# Patient Record
Sex: Female | Born: 1935
Health system: Southern US, Community
[De-identification: ages and names within clinical notes are randomized; demographics above are authoritative.]

## PROBLEM LIST (undated history)

## (undated) DIAGNOSIS — I10 Essential (primary) hypertension: Secondary | ICD-10-CM

## (undated) DIAGNOSIS — M1990 Unspecified osteoarthritis, unspecified site: Secondary | ICD-10-CM

## (undated) DIAGNOSIS — K2100 Gastro-esophageal reflux disease with esophagitis, without bleeding: Secondary | ICD-10-CM

## (undated) DIAGNOSIS — K219 Gastro-esophageal reflux disease without esophagitis: Secondary | ICD-10-CM

## (undated) DIAGNOSIS — E785 Hyperlipidemia, unspecified: Secondary | ICD-10-CM

## (undated) HISTORY — DX: Essential (primary) hypertension: I10

## (undated) HISTORY — DX: Hyperlipidemia, unspecified: E78.5

## (undated) HISTORY — DX: Unspecified osteoarthritis, unspecified site: M19.90

## (undated) HISTORY — DX: Gastro-esophageal reflux disease with esophagitis, without bleeding: K21.00

## (undated) HISTORY — DX: Gastro-esophageal reflux disease without esophagitis: K21.9

## (undated) HISTORY — PX: EYE SURGERY: SHX253

## (undated) HISTORY — DX: Gastro-esophageal reflux disease with esophagitis: K21.0

## (undated) HISTORY — PX: CATARACT EXTRACTION, BILATERAL: SHX1313

## (undated) HISTORY — PX: JOINT REPLACEMENT: SHX530

---
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
## 2004-08-26 ENCOUNTER — Ambulatory Visit: Payer: Self-pay | Admitting: Family Medicine

## 2005-08-31 ENCOUNTER — Ambulatory Visit: Payer: Self-pay | Admitting: Family Medicine

## 2006-04-15 ENCOUNTER — Ambulatory Visit: Payer: Self-pay | Admitting: Ophthalmology

## 2006-04-21 ENCOUNTER — Ambulatory Visit: Payer: Self-pay | Admitting: Ophthalmology

## 2006-09-07 ENCOUNTER — Ambulatory Visit: Payer: Self-pay | Admitting: Family Medicine

## 2007-05-26 ENCOUNTER — Ambulatory Visit: Payer: Self-pay | Admitting: Gastroenterology

## 2007-09-29 ENCOUNTER — Ambulatory Visit: Payer: Self-pay | Admitting: Family Medicine

## 2008-10-01 ENCOUNTER — Ambulatory Visit: Payer: Self-pay | Admitting: Family Medicine

## 2009-11-12 HISTORY — PX: REPLACEMENT TOTAL KNEE: SUR1224

## 2009-12-24 ENCOUNTER — Ambulatory Visit: Payer: Self-pay | Admitting: Family Medicine

## 2010-03-18 ENCOUNTER — Ambulatory Visit: Payer: Self-pay | Admitting: Family Medicine

## 2010-09-29 ENCOUNTER — Ambulatory Visit: Payer: Self-pay | Admitting: Family Medicine

## 2011-01-07 ENCOUNTER — Ambulatory Visit: Payer: Self-pay | Admitting: Family Medicine

## 2011-06-04 ENCOUNTER — Ambulatory Visit: Payer: Self-pay | Admitting: Family Medicine

## 2011-12-05 ENCOUNTER — Emergency Department: Payer: Self-pay | Admitting: Emergency Medicine

## 2011-12-05 LAB — COMPREHENSIVE METABOLIC PANEL
Alkaline Phosphatase: 150 U/L — ABNORMAL HIGH (ref 50–136)
BUN: 16 mg/dL (ref 7–18)
Calcium, Total: 8.5 mg/dL (ref 8.5–10.1)
Chloride: 108 mmol/L — ABNORMAL HIGH (ref 98–107)
Co2: 28 mmol/L (ref 21–32)
Creatinine: 0.89 mg/dL (ref 0.60–1.30)
EGFR (African American): >60
EGFR (Non-African Amer.): >60
Osmolality: 289 (ref 275–301)
SGOT(AST): 25 U/L (ref 15–37)
SGPT (ALT): 23 U/L (ref 12–78)
Sodium: 144 mmol/L (ref 136–145)

## 2011-12-05 LAB — CBC
HCT: 41.7 % (ref 35.0–47.0)
MCH: 28.2 pg (ref 26.0–34.0)
MCHC: 32.8 g/dL (ref 32.0–36.0)
RDW: 14.9 % — ABNORMAL HIGH (ref 11.5–14.5)

## 2011-12-05 LAB — CK TOTAL AND CKMB (NOT AT ARMC)
CK, Total: 132 U/L (ref 21–215)
CK-MB: 2 ng/mL (ref 0.5–3.6)

## 2012-02-05 ENCOUNTER — Ambulatory Visit: Payer: Self-pay | Admitting: Family Medicine

## 2012-05-24 ENCOUNTER — Ambulatory Visit: Payer: Self-pay | Admitting: Family Medicine

## 2012-07-07 ENCOUNTER — Ambulatory Visit: Payer: Self-pay | Admitting: Ophthalmology

## 2012-07-20 ENCOUNTER — Ambulatory Visit: Payer: Self-pay | Admitting: Ophthalmology

## 2012-08-31 ENCOUNTER — Ambulatory Visit: Payer: Self-pay | Admitting: Family Medicine

## 2012-09-07 ENCOUNTER — Ambulatory Visit: Payer: Self-pay | Admitting: Family Medicine

## 2013-05-18 DIAGNOSIS — Z9660 Presence of unspecified orthopedic joint implant: Secondary | ICD-10-CM | POA: Insufficient documentation

## 2013-06-11 ENCOUNTER — Emergency Department: Payer: Self-pay | Admitting: Internal Medicine

## 2014-01-11 ENCOUNTER — Emergency Department: Payer: Self-pay | Admitting: Emergency Medicine

## 2014-01-22 ENCOUNTER — Emergency Department: Payer: Self-pay | Admitting: Emergency Medicine

## 2014-01-22 LAB — COMPREHENSIVE METABOLIC PANEL
ALBUMIN: 3.5 g/dL (ref 3.4–5.0)
Alkaline Phosphatase: 157 U/L — ABNORMAL HIGH
Anion Gap: 7 (ref 7–16)
BUN: 22 mg/dL — ABNORMAL HIGH (ref 7–18)
Bilirubin,Total: 1 mg/dL (ref 0.2–1.0)
CO2: 33 mmol/L — AB (ref 21–32)
Calcium, Total: 8.9 mg/dL (ref 8.5–10.1)
Chloride: 100 mmol/L (ref 98–107)
Creatinine: 1.06 mg/dL (ref 0.60–1.30)
GFR CALC NON AF AMER: 54 — AB
Glucose: 106 mg/dL — ABNORMAL HIGH (ref 65–99)
Osmolality: 283 (ref 275–301)
POTASSIUM: 3.8 mmol/L (ref 3.5–5.1)
SGOT(AST): 17 U/L (ref 15–37)
SGPT (ALT): 24 U/L
SODIUM: 140 mmol/L (ref 136–145)
TOTAL PROTEIN: 7.3 g/dL (ref 6.4–8.2)

## 2014-01-22 LAB — CBC
HCT: 42.1 % (ref 35.0–47.0)
HGB: 13.3 g/dL (ref 12.0–16.0)
MCH: 27.1 pg (ref 26.0–34.0)
MCHC: 31.7 g/dL — ABNORMAL LOW (ref 32.0–36.0)
MCV: 85 fL (ref 80–100)
Platelet: 297 10*3/uL (ref 150–440)
RBC: 4.93 10*6/uL (ref 3.80–5.20)
RDW: 15.4 % — AB (ref 11.5–14.5)
WBC: 10 10*3/uL (ref 3.6–11.0)

## 2014-01-22 LAB — URINALYSIS, COMPLETE
BILIRUBIN, UR: NEGATIVE
BLOOD: NEGATIVE
GLUCOSE, UR: NEGATIVE mg/dL (ref 0–75)
Ketone: NEGATIVE
NITRITE: NEGATIVE
Ph: 5 (ref 4.5–8.0)
Protein: NEGATIVE
SPECIFIC GRAVITY: 1.012 (ref 1.003–1.030)
Squamous Epithelial: 1

## 2014-01-22 LAB — CK TOTAL AND CKMB (NOT AT ARMC)
CK, Total: 101 U/L (ref 26–192)
CK-MB: 1.3 ng/mL (ref 0.5–3.6)

## 2014-01-22 LAB — TROPONIN I

## 2014-02-14 DIAGNOSIS — H6123 Impacted cerumen, bilateral: Secondary | ICD-10-CM | POA: Diagnosis not present

## 2014-02-14 DIAGNOSIS — R42 Dizziness and giddiness: Secondary | ICD-10-CM | POA: Diagnosis not present

## 2014-02-14 DIAGNOSIS — I10 Essential (primary) hypertension: Secondary | ICD-10-CM | POA: Diagnosis not present

## 2014-02-19 DIAGNOSIS — R42 Dizziness and giddiness: Secondary | ICD-10-CM | POA: Diagnosis not present

## 2014-02-19 DIAGNOSIS — I10 Essential (primary) hypertension: Secondary | ICD-10-CM | POA: Diagnosis not present

## 2014-02-19 DIAGNOSIS — R51 Headache: Secondary | ICD-10-CM | POA: Diagnosis not present

## 2014-03-06 DIAGNOSIS — N952 Postmenopausal atrophic vaginitis: Secondary | ICD-10-CM | POA: Diagnosis not present

## 2014-03-29 DIAGNOSIS — I10 Essential (primary) hypertension: Secondary | ICD-10-CM | POA: Diagnosis not present

## 2014-04-05 DIAGNOSIS — H35412 Lattice degeneration of retina, left eye: Secondary | ICD-10-CM | POA: Diagnosis not present

## 2014-05-23 DIAGNOSIS — T148 Other injury of unspecified body region: Secondary | ICD-10-CM | POA: Diagnosis not present

## 2014-06-01 NOTE — Op Note (Signed)
PATIENT NAME:  LUA, FENG MR#:  [id number] DATE OF BIRTH:  Mar 17, 1935  DATE OF PROCEDURE:  07/20/2012  PREOPERATIVE DIAGNOSIS:  Senile cataract, left eye.  POSTOPERATIVE DIAGNOSIS:  Senile cataract, left eye.  PROCEDURE:  Phacoemulsification with posterior chamber intraocular lens implantation of the left eye.  LENS:  SN60WF 21.5-diopter posterior chamber intraocular lens.  ULTRASOUND TIME:  12% of 1 minute, 15 seconds.  CDE 8.9.   SURGEON:  Mali Marlayna Bannister, MD  ANESTHESIA:  Topical with tetracaine drops and 2% Xylocaine jelly.  COMPLICATIONS:  None.  DESCRIPTION OF PROCEDURE:  The patient was identified in the holding room and transported to the operating room and placed in the supine position under the operating microscope.  The left eye was identified as the operative eye and it was prepped and draped in the usual sterile ophthalmic fashion.  A 1 millimeter clear-corneal paracentesis was made at the 1:30 position.  The anterior chamber was filled with Viscoat viscoelastic.  A 2.4 millimeter keratome was used to make a near-clear corneal incision at the 10:30 position.  A curvilinear capsulorrhexis was made with a cystotome and capsulorrhexis forceps.  Balanced salt solution was used to hydrodissect and hydrodelineate the nucleus.  Phacoemulsification was then used in stop and chop fashion to remove the lens nucleus and epinucleus.  The remaining cortex was then removed using the irrigation and aspiration handpiece. Provisc was then placed into the capsular bag to distend it for lens placement.  A SN60WF 21.5-diopter lens was then injected into the capsular bag.  The remaining viscoelastic was aspirated.  Wounds were hydrated with balanced salt solution.  The anterior chamber was inflated to a physiologic pressure with balanced salt solution.  0.1 mL of cefuroxime 10 mg/mL were injected into the anterior chamber for a dose of 1 mg of intracameral antibiotic at the completion of the  case. Miostat was placed into the anterior chamber to constrict the pupil.  No wound leaks were noted.  Topical Vigamox drops and Maxitrol ointment were applied to the eye.  The patient was taken to the recovery room in stable condition without complications of anesthesia or surgery.   ____________________________ Wyonia Hough, MD crb:cb D: 07/20/2012 12:36:00 ET T: 07/20/2012 15:14:48 ET JOB#: [id number]  cc: Wyonia Hough, MD, <Dictator>  Leandrew Koyanagi MD ELECTRONICALLY SIGNED 07/27/2012 12:09

## 2014-07-31 ENCOUNTER — Ambulatory Visit (INDEPENDENT_AMBULATORY_CARE_PROVIDER_SITE_OTHER): Payer: BLUE CROSS/BLUE SHIELD | Admitting: Family Medicine

## 2014-07-31 ENCOUNTER — Encounter: Payer: Self-pay | Admitting: Family Medicine

## 2014-07-31 VITALS — BP 128/78 | HR 81 | Temp 98.5°F | Resp 18 | Ht 63.0 in | Wt 143.0 lb

## 2014-07-31 DIAGNOSIS — K219 Gastro-esophageal reflux disease without esophagitis: Secondary | ICD-10-CM | POA: Insufficient documentation

## 2014-07-31 DIAGNOSIS — E785 Hyperlipidemia, unspecified: Secondary | ICD-10-CM | POA: Insufficient documentation

## 2014-07-31 DIAGNOSIS — M150 Primary generalized (osteo)arthritis: Secondary | ICD-10-CM

## 2014-07-31 DIAGNOSIS — M159 Polyosteoarthritis, unspecified: Secondary | ICD-10-CM

## 2014-07-31 DIAGNOSIS — I10 Essential (primary) hypertension: Secondary | ICD-10-CM | POA: Insufficient documentation

## 2014-07-31 DIAGNOSIS — Z9660 Presence of unspecified orthopedic joint implant: Secondary | ICD-10-CM | POA: Diagnosis not present

## 2014-07-31 DIAGNOSIS — M170 Bilateral primary osteoarthritis of knee: Secondary | ICD-10-CM | POA: Insufficient documentation

## 2014-07-31 NOTE — Progress Notes (Signed)
Name: Catherine Burgess   MRN: [id number]    DOB: [birth year]   Date:07/31/2014       Progress Note  Subjective  Chief Complaint  Chief Complaint  Patient presents with  . Hypertension    Hypertension This is a chronic problem. The current episode started more than 1 year ago. The problem is unchanged. The problem is controlled. Pertinent negatives include no blurred vision, chest pain, headaches, neck pain, orthopnea, palpitations or shortness of breath. There are no associated agents to hypertension. Past treatments include angiotensin blockers. There are no compliance problems.  There is no history of angina or CAD/MI.  Arthritis Presents for follow-up visit. She complains of pain. Affected locations include the left hip. Associated symptoms include fatigue and pain at night. Pertinent negatives include no diarrhea, dysuria, fever or weight loss. Her past medical history is significant for psoriasis. Past treatments include surgery and NSAIDs. Compliance with total regimen is 76-100%.  Hyperlipidemia This is a chronic problem. The current episode started more than 1 year ago. The problem is controlled. Recent lipid tests were reviewed and are normal. Pertinent negatives include no chest pain, focal weakness, myalgias or shortness of breath. Current antihyperlipidemic treatment includes statins. Risk factors for coronary artery disease include dyslipidemia, hypertension and a sedentary lifestyle.      Past Medical History  Diagnosis Date  . Hypertension     History  Substance Use Topics  . Smoking status: Never Smoker   . Smokeless tobacco: Not on file  . Alcohol Use: No     Current outpatient prescriptions:  .  atorvastatin (LIPITOR) 20 MG tablet, , Disp: , Rfl: 0 .  atorvastatin (LIPITOR) 40 MG tablet, , Disp: , Rfl: 0 .  chlorthalidone (HYGROTON) 25 MG tablet, Take 25 mg by mouth daily., Disp: , Rfl: 0 .  gabapentin (NEURONTIN) 300 MG capsule, , Disp: , Rfl: 0 .  losartan  (COZAAR) 100 MG tablet, , Disp: , Rfl: 0 .  meloxicam (MOBIC) 7.5 MG tablet, , Disp: , Rfl: 0 .  pantoprazole (PROTONIX) 40 MG tablet, , Disp: , Rfl: 1  Allergies  Allergen Reactions  . Lovastatin Other (See Comments)    chest  . Rosuvastatin     Other reaction(s): Muscle Pain chest  . Statins     Other reaction(s): Muscle Pain chest    Review of Systems  Constitutional: Positive for fatigue. Negative for fever, chills and weight loss.  HENT: Negative for congestion, hearing loss, sore throat and tinnitus.   Eyes: Negative for blurred vision, double vision and redness.  Respiratory: Negative for cough, hemoptysis and shortness of breath.   Cardiovascular: Negative for chest pain, palpitations, orthopnea, claudication and leg swelling.  Gastrointestinal: Negative for heartburn, nausea, vomiting, diarrhea, constipation and blood in stool.  Genitourinary: Negative for dysuria, urgency, frequency and hematuria.  Musculoskeletal: Positive for joint pain and arthritis. Negative for myalgias, back pain, falls and neck pain.  Skin: Negative for itching.  Neurological: Negative for dizziness, tingling, tremors, focal weakness, seizures, loss of consciousness, weakness and headaches.  Endo/Heme/Allergies: Does not bruise/bleed easily.  Psychiatric/Behavioral: Negative for depression and substance abuse. The patient is nervous/anxious. The patient does not have insomnia.      Objective  Filed Vitals:   07/31/14 1121  BP: 128/78  Pulse: 81  Temp: 98.5 F (36.9 C)  Resp: 18  Height: 5\' 3"  (1.6 m)  Weight: 143 lb (64.864 kg)  SpO2: 95%     Physical Exam  Constitutional: She is  oriented to person, place, and time and well-developed, well-nourished, and in no distress.  HENT:  Head: Normocephalic.  Eyes: EOM are normal. Pupils are equal, round, and reactive to light.  Neck: Normal range of motion. No thyromegaly present.  Cardiovascular: Normal rate, regular rhythm and normal  heart sounds.   No murmur heard. Pulmonary/Chest: Effort normal and breath sounds normal.  Abdominal: Soft. Bowel sounds are normal.  Musculoskeletal: She exhibits tenderness (LEFT HIP TENDER WITH LIMITED RANGE OF MOTION AN ANTALGIC GAIT). She exhibits no edema.  Neurological: She is alert and oriented to person, place, and time. No cranial nerve deficit. Gait normal.  Skin: Skin is warm and dry. No rash noted.  Psychiatric: Memory and affect normal.      Assessment & Plan  1. Essential hypertension Well-controlled - Comprehensive metabolic panel  2. Primary osteoarthritis involving multiple joints stable  3. HLD (hyperlipidemia) Lab work today - TSH - Lipid panel  4. History of artificial joint stable

## 2014-07-31 NOTE — Patient Instructions (Signed)
F/U 4 MO 

## 2014-08-04 DIAGNOSIS — R21 Rash and other nonspecific skin eruption: Secondary | ICD-10-CM | POA: Diagnosis not present

## 2014-08-06 DIAGNOSIS — I10 Essential (primary) hypertension: Secondary | ICD-10-CM | POA: Diagnosis not present

## 2014-08-06 DIAGNOSIS — E785 Hyperlipidemia, unspecified: Secondary | ICD-10-CM | POA: Diagnosis not present

## 2014-08-06 DIAGNOSIS — Z9660 Presence of unspecified orthopedic joint implant: Secondary | ICD-10-CM | POA: Diagnosis not present

## 2014-08-07 LAB — COMPREHENSIVE METABOLIC PANEL
ALBUMIN: 3.8 g/dL (ref 3.5–4.8)
ALK PHOS: 130 IU/L — AB (ref 39–117)
ALT: 17 IU/L (ref 0–32)
AST: 18 IU/L (ref 0–40)
Albumin/Globulin Ratio: 1.8 (ref 1.1–2.5)
BILIRUBIN TOTAL: 0.7 mg/dL (ref 0.0–1.2)
BUN/Creatinine Ratio: 16 (ref 11–26)
BUN: 16 mg/dL (ref 8–27)
CHLORIDE: 101 mmol/L (ref 97–108)
CO2: 27 mmol/L (ref 18–29)
CREATININE: 0.98 mg/dL (ref 0.57–1.00)
Calcium: 9.2 mg/dL (ref 8.7–10.3)
GFR calc non Af Amer: 56 mL/min/{1.73_m2} — ABNORMAL LOW (ref >59)
GFR, EST AFRICAN AMERICAN: 65 mL/min/{1.73_m2} (ref >59)
Globulin, Total: 2.1 g/dL (ref 1.5–4.5)
Glucose: 93 mg/dL (ref 65–99)
Potassium: 4.4 mmol/L (ref 3.5–5.2)
Sodium: 142 mmol/L (ref 134–144)
Total Protein: 5.9 g/dL — ABNORMAL LOW (ref 6.0–8.5)

## 2014-08-07 LAB — LIPID PANEL
CHOL/HDL RATIO: 2.4 ratio (ref 0.0–4.4)
Cholesterol, Total: 110 mg/dL (ref 100–199)
HDL: 46 mg/dL (ref >39)
LDL CALC: 52 mg/dL (ref 0–99)
Triglycerides: 61 mg/dL (ref 0–149)
VLDL Cholesterol Cal: 12 mg/dL (ref 5–40)

## 2014-08-07 LAB — TSH: TSH: 1.03 u[IU]/mL (ref 0.450–4.500)

## 2014-08-09 NOTE — Progress Notes (Signed)
Patient notified of lab results

## 2014-08-23 ENCOUNTER — Other Ambulatory Visit: Payer: Self-pay | Admitting: Family Medicine

## 2014-08-24 ENCOUNTER — Other Ambulatory Visit: Payer: Self-pay | Admitting: Family Medicine

## 2014-09-04 ENCOUNTER — Ambulatory Visit: Payer: Self-pay | Admitting: Obstetrics and Gynecology

## 2014-09-26 ENCOUNTER — Ambulatory Visit (INDEPENDENT_AMBULATORY_CARE_PROVIDER_SITE_OTHER): Payer: Medicare Other | Admitting: Obstetrics and Gynecology

## 2014-09-26 ENCOUNTER — Encounter: Payer: Self-pay | Admitting: Obstetrics and Gynecology

## 2014-09-26 VITALS — BP 106/67 | HR 77 | Resp 16 | Ht 63.0 in | Wt 144.0 lb

## 2014-09-26 DIAGNOSIS — N952 Postmenopausal atrophic vaginitis: Secondary | ICD-10-CM | POA: Diagnosis not present

## 2014-09-26 NOTE — Progress Notes (Signed)
GYNECOLOGY PROGRESS NOTE  Subjective:    Patient ID: Catherine Burgess, female    DOB: 01/19/1936, 79 y.o.   MRN: [id number]  HPI  Patient is a 79 y.o. postmenopausal female who presents for 6 month f/u of vaginal atrophy. Patient currently using vaginal moisturizers prn.  Deni complaints.  Feels well overall.   The following portions of the patient's history were reviewed and updated as appropriate: allergies, current medications, past family history, past medical history, past social history, past surgical history and problem list.  Review of Systems A comprehensive review of systems was negative.   Objective:   Blood pressure 106/67, pulse 77, resp. rate 16, height 5\' 3"  (1.6 m), weight 144 lb (65.318 kg). General appearance: alert and no distress Abdomen: soft, non-tender; bowel sounds normal; no masses,  no organomegaly Pelvic: external genitalia without lesions.  Areas of vitiligo noted.  Agglutination of posterior forchette noted, separated manually without difficulty.  Mild-moderate atrophy present at introitus and vaginal mucosa.  No vaginal discharge present. Cervix firm, flushed to vaginal vault.  Bimanual exam not done.   Extremities: extremities normal, atraumatic, no cyanosis or edema Neurologic: Grossly normal   Assessment:   Postmenopausal vaginal atrophy  Plan:   Patient's symptoms of vaginal dryness and irritation have improved with use of vaginal moisturizers, however atrophy and areas of agglutination present on today's exam.  Advised on resuming use of estrogen cream externally once weekly to prevent further agglutination. Can still use vaginal moisturizers internally as needed. Given samples of Premarin today.  RTC again in 6 months. Will need annual exam at that time.    Rubie Maid, MD [facility]

## 2014-09-26 NOTE — Progress Notes (Signed)
Patient ID: Catherine Burgess, female   DOB: 13-Dec-1935, 79 y.o.   MRN: [id number]

## 2014-10-17 ENCOUNTER — Ambulatory Visit (INDEPENDENT_AMBULATORY_CARE_PROVIDER_SITE_OTHER): Payer: Medicare Other | Admitting: Family Medicine

## 2014-10-17 ENCOUNTER — Encounter: Payer: Self-pay | Admitting: Family Medicine

## 2014-10-17 VITALS — BP 120/68 | HR 89 | Temp 98.6°F | Resp 16 | Ht 63.0 in | Wt 142.2 lb

## 2014-10-17 DIAGNOSIS — W57XXXA Bitten or stung by nonvenomous insect and other nonvenomous arthropods, initial encounter: Secondary | ICD-10-CM

## 2014-10-17 DIAGNOSIS — S80862A Insect bite (nonvenomous), left lower leg, initial encounter: Secondary | ICD-10-CM

## 2014-10-17 MED ORDER — PREDNISONE 10 MG PO TABS: 10.0000 mg | ORAL_TABLET | Freq: Every day | ORAL | Status: DC

## 2014-10-17 NOTE — Progress Notes (Signed)
Name: Catherine Burgess   MRN: [id number]    DOB: 1936/01/06   Date:10/17/2014       Progress Note  Subjective  Chief Complaint  Chief Complaint  Patient presents with  . Leg Swelling    left leg swollen possible insect bite    HPI  Possible insect bite of the lower extremity   She later experienced some pain and discomfort in the left thigh area and subsequently has developed some redness and swelling. She is concerned about a small dot that may be a tic as well in the area there's been no fever or chills myalgias. about one week ago the patient was assisting someone in cleaning her yard.     Past Medical History  Diagnosis Date  . Hypertension     Social History  Substance Use Topics  . Smoking status: Never Smoker   . Smokeless tobacco: Not on file  . Alcohol Use: No     Current outpatient prescriptions:  .  atorvastatin (LIPITOR) 20 MG tablet, , Disp: , Rfl: 0 .  atorvastatin (LIPITOR) 40 MG tablet, Take 40 mg by mouth daily at 6 PM. , Disp: , Rfl: 0 .  chlorthalidone (HYGROTON) 25 MG tablet, Take 25 mg by mouth daily., Disp: , Rfl: 0 .  gabapentin (NEURONTIN) 300 MG capsule, take 1 capsule by mouth three times a day (Patient taking differently: take 1 capsule by mouth two times a day), Disp: 90 capsule, Rfl: 6 .  losartan (COZAAR) 100 MG tablet, Take 100 mg by mouth daily. , Disp: , Rfl: 0 .  meloxicam (MOBIC) 7.5 MG tablet, take 1 tablet by mouth twice a day, Disp: 60 tablet, Rfl: 1 .  pantoprazole (PROTONIX) 40 MG tablet, Take 40 mg by mouth daily. , Disp: , Rfl: 1  Allergies  Allergen Reactions  . Lovastatin Other (See Comments)    chest  . Rosuvastatin     Other reaction(s): Muscle Pain chest  . Statins     Other reaction(s): Muscle Pain chest    Review of Systems  Constitutional: Positive for fever. Negative for chills.  Musculoskeletal: Negative for joint pain.  Skin: Positive for itching and rash.     Objective  Filed Vitals:   10/17/14 1045   BP: 120/68  Pulse: 89  Temp: 98.6 F (37 C)  Resp: 16  Height: 5\' 3"  (1.6 m)  Weight: 142 lb 4 oz (64.524 kg)  SpO2: 97%     Physical Exam  There is a 8 x 10 cm area of mild erythema on the inner aspect of the left thigh. A 3 mm hyperpigmented area under magnifying glasses merely a flat mole and not a tic or foreign body. It was made to tease it with tweezers without success  Assessment & Plan   1. Insect bite of left leg, initial encounter  - predniSONE (DELTASONE) 10 MG tablet; Take 1 tablet (10 mg total) by mouth daily with breakfast.  Dispense: 10 tablet; Refill: 0

## 2014-10-30 ENCOUNTER — Other Ambulatory Visit: Payer: Self-pay | Admitting: Family Medicine

## 2014-11-08 ENCOUNTER — Other Ambulatory Visit: Payer: Self-pay | Admitting: Family Medicine

## 2014-12-03 ENCOUNTER — Ambulatory Visit: Payer: Medicare Other | Admitting: Family Medicine

## 2014-12-05 NOTE — Progress Notes (Signed)
Name: Catherine Burgess   MRN: [id number]    DOB: 01-06-1936   Date:12/05/2014       Progress Note  Subjective  Chief Complaint  No chief complaint on file.   HPI    Past Medical History  Diagnosis Date  . Hypertension     Social History  Substance Use Topics  . Smoking status: Never Smoker   . Smokeless tobacco: Not on file  . Alcohol Use: No     Current outpatient prescriptions:  .  atorvastatin (LIPITOR) 20 MG tablet, , Disp: , Rfl: 0 .  atorvastatin (LIPITOR) 40 MG tablet, take 1 tablet by mouth once daily, Disp: 30 tablet, Rfl: 4 .  chlorthalidone (HYGROTON) 25 MG tablet, Take 25 mg by mouth daily., Disp: , Rfl: 0 .  gabapentin (NEURONTIN) 300 MG capsule, take 1 capsule by mouth three times a day (Patient taking differently: take 1 capsule by mouth two times a day), Disp: 90 capsule, Rfl: 6 .  losartan (COZAAR) 100 MG tablet, take 1 tablet by mouth once daily, Disp: 30 tablet, Rfl: 5 .  meloxicam (MOBIC) 7.5 MG tablet, take 1 tablet by mouth twice a day with food or milk, Disp: 60 tablet, Rfl: 1 .  pantoprazole (PROTONIX) 40 MG tablet, Take 40 mg by mouth daily. , Disp: , Rfl: 1 .  predniSONE (DELTASONE) 10 MG tablet, Take 1 tablet (10 mg total) by mouth daily with breakfast., Disp: 10 tablet, Rfl: 0  Allergies  Allergen Reactions  . Lovastatin Other (See Comments)    chest  . Rosuvastatin     Other reaction(s): Muscle Pain chest  . Statins     Other reaction(s): Muscle Pain chest    ROS   Objective  There were no vitals filed for this visit.   Physical Exam    Assessment & Plan  Erroneous Encounter

## 2014-12-18 ENCOUNTER — Ambulatory Visit: Payer: Medicare Other | Admitting: Family Medicine

## 2014-12-26 ENCOUNTER — Encounter: Payer: Self-pay | Admitting: Family Medicine

## 2014-12-26 ENCOUNTER — Ambulatory Visit (INDEPENDENT_AMBULATORY_CARE_PROVIDER_SITE_OTHER): Payer: Medicare Other | Admitting: Family Medicine

## 2014-12-26 VITALS — BP 102/68 | HR 81 | Temp 98.6°F | Resp 16 | Ht 63.0 in | Wt 144.1 lb

## 2014-12-26 DIAGNOSIS — E785 Hyperlipidemia, unspecified: Secondary | ICD-10-CM

## 2014-12-26 DIAGNOSIS — M170 Bilateral primary osteoarthritis of knee: Secondary | ICD-10-CM | POA: Diagnosis not present

## 2014-12-26 DIAGNOSIS — I10 Essential (primary) hypertension: Secondary | ICD-10-CM

## 2014-12-26 MED ORDER — MELOXICAM 15 MG PO TABS: 15.0000 mg | ORAL_TABLET | Freq: Every day | ORAL | Status: DC

## 2014-12-26 MED ORDER — LOSARTAN POTASSIUM 100 MG PO TABS: 50.0000 mg | ORAL_TABLET | Freq: Every day | ORAL | Status: DC

## 2014-12-26 MED ORDER — TRAMADOL-ACETAMINOPHEN 37.5-325 MG PO TABS: 1.0000 | ORAL_TABLET | Freq: Three times a day (TID) | ORAL | Status: DC | PRN

## 2014-12-26 NOTE — Progress Notes (Signed)
Name: Catherine Burgess   MRN: [id number]    DOB: 05-21-1935   Date:12/26/2014       Progress Note  Subjective  Chief Complaint  Chief Complaint  Patient presents with  . Hypertension    4 month follow up  . Hyperlipidemia    HPI  Hypertension   Patient presents for follow-up of hypertension. It has been present for over 5  years.  Patient states that there is compliance with medical regimen which consists of hypertension 25 mg daily and losartan 100 mg daily . There is no end organ disease. Cardiac risk factors include hypertension hyperlipidemia and diabetes.  Exercise regimen consist of some walking .  Diet consist of sodium restriction .  Hyperlipidemia  Patient has a history of hyperlipidemia for over 5 years.  Current medical regimen consist of atorvastatin 20 mg daily at bedtime .  Compliance is  good.  Diet and exercise are currently followed fairly well.  Risk factors for cardiovascular disease include hyperlipidemia and hypertension .   There have been no side effects from the medication.    Past Medical History  Diagnosis Date  . Hypertension     Social History  Substance Use Topics  . Smoking status: Never Smoker   . Smokeless tobacco: Not on file  . Alcohol Use: No     Current outpatient prescriptions:  .  atorvastatin (LIPITOR) 20 MG tablet, , Disp: , Rfl: 0 .  atorvastatin (LIPITOR) 40 MG tablet, take 1 tablet by mouth once daily, Disp: 30 tablet, Rfl: 4 .  chlorthalidone (HYGROTON) 25 MG tablet, Take 25 mg by mouth daily., Disp: , Rfl: 0 .  gabapentin (NEURONTIN) 300 MG capsule, take 1 capsule by mouth three times a day (Patient taking differently: take 1 capsule by mouth two times a day), Disp: 90 capsule, Rfl: 6 .  losartan (COZAAR) 100 MG tablet, take 1 tablet by mouth once daily, Disp: 30 tablet, Rfl: 5 .  meloxicam (MOBIC) 7.5 MG tablet, take 1 tablet by mouth twice a day with food or milk, Disp: 60 tablet, Rfl: 1 .  pantoprazole (PROTONIX) 40 MG  tablet, Take 40 mg by mouth daily. , Disp: , Rfl: 1 .  predniSONE (DELTASONE) 10 MG tablet, Take 1 tablet (10 mg total) by mouth daily with breakfast., Disp: 10 tablet, Rfl: 0  Allergies  Allergen Reactions  . Lovastatin Other (See Comments)    chest  . Rosuvastatin     Other reaction(s): Muscle Pain chest  . Statins     Other reaction(s): Muscle Pain chest    Review of Systems  Constitutional: Negative for fever, chills and weight loss.  HENT: Negative for congestion, hearing loss, sore throat and tinnitus.   Eyes: Negative for blurred vision, double vision and redness.  Respiratory: Negative for cough, hemoptysis and shortness of breath.   Cardiovascular: Negative for chest pain, palpitations, orthopnea, claudication and leg swelling.  Gastrointestinal: Negative for heartburn, nausea, vomiting, diarrhea, constipation and blood in stool.  Genitourinary: Negative for dysuria, urgency, frequency and hematuria.  Musculoskeletal: Positive for joint pain. Negative for myalgias, back pain, falls and neck pain.  Skin: Negative for itching.  Neurological: Negative for dizziness, tingling, tremors, focal weakness, seizures, loss of consciousness, weakness and headaches.  Endo/Heme/Allergies: Does not bruise/bleed easily.  Psychiatric/Behavioral: Negative for depression and substance abuse. The patient is not nervous/anxious and does not have insomnia.      Objective  Filed Vitals:   12/26/14 1058  BP: 102/68  Pulse:  81  Temp: 98.6 F (37 C)  TempSrc: Oral  Resp: 16  Height: 5\' 3"  (1.6 m)  Weight: 144 lb 1.6 oz (65.363 kg)  SpO2: 96%     Physical Exam  Constitutional: She is oriented to person, place, and time and well-developed, well-nourished, and in no distress.  HENT:  Head: Normocephalic.  Eyes: EOM are normal. Pupils are equal, round, and reactive to light.  Neck: Normal range of motion. No thyromegaly present.  Cardiovascular: Normal rate, regular rhythm and normal  heart sounds.   No murmur heard. Pulmonary/Chest: Effort normal and breath sounds normal.  Abdominal: Soft. Bowel sounds are normal.  Musculoskeletal: She exhibits no edema.  It abnormality secondary to hip abnormality  Neurological: She is alert and oriented to person, place, and time. No cranial nerve deficit. Gait normal.  Skin: Skin is warm and dry. No rash noted.  Psychiatric: Memory and affect normal.      Assessment & Plan  1. Essential hypertension Well-controlled - Comprehensive Metabolic Panel (CMET) - Lipid panel - TSH  2. Hyperlipemia Labs - Lipid panel - TSH  3. Osteoarthritis of both knees, unspecified osteoarthritis type Tramadol with gabapentin

## 2015-01-15 DIAGNOSIS — E785 Hyperlipidemia, unspecified: Secondary | ICD-10-CM | POA: Diagnosis not present

## 2015-01-15 DIAGNOSIS — I10 Essential (primary) hypertension: Secondary | ICD-10-CM | POA: Diagnosis not present

## 2015-01-15 DIAGNOSIS — Z96652 Presence of left artificial knee joint: Secondary | ICD-10-CM | POA: Diagnosis not present

## 2015-01-15 DIAGNOSIS — M24662 Ankylosis, left knee: Secondary | ICD-10-CM | POA: Diagnosis not present

## 2015-01-15 DIAGNOSIS — M7050 Other bursitis of knee, unspecified knee: Secondary | ICD-10-CM | POA: Diagnosis not present

## 2015-01-16 LAB — COMPREHENSIVE METABOLIC PANEL
A/G RATIO: 1.9 (ref 1.1–2.5)
ALK PHOS: 124 IU/L — AB (ref 39–117)
ALT: 17 IU/L (ref 0–32)
AST: 21 IU/L (ref 0–40)
Albumin: 4.1 g/dL (ref 3.5–4.8)
BUN/Creatinine Ratio: 19 (ref 11–26)
BUN: 20 mg/dL (ref 8–27)
Bilirubin Total: 0.5 mg/dL (ref 0.0–1.2)
CO2: 28 mmol/L (ref 18–29)
Calcium: 9.4 mg/dL (ref 8.7–10.3)
Chloride: 101 mmol/L (ref 97–106)
Creatinine, Ser: 1.04 mg/dL — ABNORMAL HIGH (ref 0.57–1.00)
GFR calc Af Amer: 60 mL/min/{1.73_m2} (ref >59)
GFR calc non Af Amer: 52 mL/min/{1.73_m2} — ABNORMAL LOW (ref >59)
GLOBULIN, TOTAL: 2.2 g/dL (ref 1.5–4.5)
Glucose: 91 mg/dL (ref 65–99)
Potassium: 4.1 mmol/L (ref 3.5–5.2)
SODIUM: 143 mmol/L (ref 136–144)
Total Protein: 6.3 g/dL (ref 6.0–8.5)

## 2015-01-16 LAB — LIPID PANEL
CHOLESTEROL TOTAL: 112 mg/dL (ref 100–199)
Chol/HDL Ratio: 2.7 ratio units (ref 0.0–4.4)
HDL: 41 mg/dL (ref >39)
LDL Calculated: 55 mg/dL (ref 0–99)
Triglycerides: 79 mg/dL (ref 0–149)
VLDL Cholesterol Cal: 16 mg/dL (ref 5–40)

## 2015-01-16 LAB — TSH: TSH: 1.39 u[IU]/mL (ref 0.450–4.500)

## 2015-01-18 ENCOUNTER — Telehealth: Payer: Self-pay | Admitting: Emergency Medicine

## 2015-01-18 NOTE — Telephone Encounter (Signed)
Patient notified of lab results

## 2015-01-20 DIAGNOSIS — Z96659 Presence of unspecified artificial knee joint: Secondary | ICD-10-CM | POA: Insufficient documentation

## 2015-01-22 ENCOUNTER — Telehealth: Payer: Self-pay | Admitting: Family Medicine

## 2015-01-22 DIAGNOSIS — J019 Acute sinusitis, unspecified: Secondary | ICD-10-CM

## 2015-01-22 NOTE — Telephone Encounter (Signed)
Patient has sinus infection causing facial swelling that began Sunday no fever. Requesting antibiotic to be called to [facility]

## 2015-01-22 NOTE — Telephone Encounter (Signed)
[id number] 

## 2015-01-23 MED ORDER — AZITHROMYCIN 250 MG PO TABS: ORAL_TABLET | ORAL | Status: DC

## 2015-01-23 NOTE — Telephone Encounter (Signed)
Medication has been sent to pt pharmacy. Please call and inform pt,thank you.

## 2015-01-23 NOTE — Telephone Encounter (Signed)
Left voice message to inform her that prescription has been called into her pharmacy.

## 2015-03-28 ENCOUNTER — Ambulatory Visit (INDEPENDENT_AMBULATORY_CARE_PROVIDER_SITE_OTHER): Payer: Medicare Other | Admitting: Obstetrics and Gynecology

## 2015-03-28 ENCOUNTER — Encounter: Payer: Self-pay | Admitting: Obstetrics and Gynecology

## 2015-03-28 VITALS — BP 121/63 | HR 76 | Ht 63.0 in | Wt 142.8 lb

## 2015-03-28 DIAGNOSIS — N952 Postmenopausal atrophic vaginitis: Secondary | ICD-10-CM | POA: Diagnosis not present

## 2015-03-28 MED ORDER — ESTROGENS, CONJUGATED 0.625 MG/GM VA CREA: 1.0000 | TOPICAL_CREAM | VAGINAL | Status: DC

## 2015-03-28 NOTE — Progress Notes (Signed)
GYNECOLOGY PROGRESS NOTE  Subjective:    Patient ID: Catherine Burgess, female    DOB: 1936/01/17, 80 y.o.   MRN: [id number]  HPI  Patient is a 80 y.o. postmenopausal female who presents for 6 month f/u of vaginal atrophy. Patient currently using vaginal moisturizers prn internally and estrogen cream externally for h/o labial agglutination.  Deni complaints.  Feels well overall.   The following portions of the patient's history were reviewed and updated as appropriate: allergies, current medications, past family history, past medical history, past social history, past surgical history and problem list.  Review of Systems A comprehensive review of systems was negative except for: Genitourinary: positive for small lesion on right labia majora, mildly painful   Objective:   Blood pressure 121/63, pulse 76, height 5\' 3"  (1.6 m), weight 142 lb 12.8 oz (64.774 kg). General appearance: alert and no distress Abdomen: soft, non-tender; bowel sounds normal; no masses,  no organomegaly Pelvic: external genitalia without lesions.  Areas of vitiligo noted.  Mild-moderate atrophy present at introitus and vaginal mucosa.  No vaginal discharge present. Cervix firm, flushed to vaginal vault.  Bimanual exam with normal uterus size, shape, mobility, nontender.  Normal adnexae without tenderness or masses.   Extremities: extremities normal, atraumatic, no cyanosis or edema Neurologic: Grossly normal   Assessment:   Postmenopausal vaginal atrophy H/o labial agglutination  Plan:   Patient's symptoms of vaginal dryness and irritation have improved with use of vaginal moisturizers, however atrophy still present.  Patient continues to decline internal use of estrogen cream.  Will continue to use externally.  No areas of agglutination noted today. Continue estrogen cream externally once weekly to prevent further agglutination. Given samples and will call in prescription.  Can still use vaginal moisturizers  internally as needed.  F/u in 6-12 months.    Rubie Maid, MD [facility]

## 2015-04-30 ENCOUNTER — Ambulatory Visit: Payer: Medicare Other | Admitting: Family Medicine

## 2015-05-01 ENCOUNTER — Other Ambulatory Visit: Payer: Self-pay | Admitting: Family Medicine

## 2015-05-01 DIAGNOSIS — I498 Other specified cardiac arrhythmias: Secondary | ICD-10-CM

## 2015-05-01 DIAGNOSIS — R002 Palpitations: Secondary | ICD-10-CM

## 2015-05-03 DIAGNOSIS — R002 Palpitations: Secondary | ICD-10-CM | POA: Diagnosis not present

## 2015-05-03 DIAGNOSIS — R011 Cardiac murmur, unspecified: Secondary | ICD-10-CM | POA: Diagnosis not present

## 2015-05-03 DIAGNOSIS — I208 Other forms of angina pectoris: Secondary | ICD-10-CM | POA: Diagnosis not present

## 2015-05-03 DIAGNOSIS — R0602 Shortness of breath: Secondary | ICD-10-CM | POA: Diagnosis not present

## 2015-05-03 DIAGNOSIS — I10 Essential (primary) hypertension: Secondary | ICD-10-CM | POA: Diagnosis not present

## 2015-05-08 DIAGNOSIS — R002 Palpitations: Secondary | ICD-10-CM | POA: Diagnosis not present

## 2015-05-20 DIAGNOSIS — I208 Other forms of angina pectoris: Secondary | ICD-10-CM | POA: Diagnosis not present

## 2015-05-20 DIAGNOSIS — R011 Cardiac murmur, unspecified: Secondary | ICD-10-CM | POA: Diagnosis not present

## 2015-05-20 DIAGNOSIS — R0602 Shortness of breath: Secondary | ICD-10-CM | POA: Diagnosis not present

## 2015-05-28 ENCOUNTER — Ambulatory Visit (INDEPENDENT_AMBULATORY_CARE_PROVIDER_SITE_OTHER): Payer: Medicare Other | Admitting: Family Medicine

## 2015-05-28 ENCOUNTER — Ambulatory Visit: Payer: Medicare Other | Admitting: Family Medicine

## 2015-05-28 ENCOUNTER — Encounter: Payer: Self-pay | Admitting: Family Medicine

## 2015-05-28 VITALS — BP 118/66 | HR 86 | Temp 98.1°F | Resp 18 | Wt 141.2 lb

## 2015-05-28 DIAGNOSIS — R61 Generalized hyperhidrosis: Secondary | ICD-10-CM

## 2015-05-28 DIAGNOSIS — R55 Syncope and collapse: Secondary | ICD-10-CM | POA: Diagnosis not present

## 2015-05-28 DIAGNOSIS — R739 Hyperglycemia, unspecified: Secondary | ICD-10-CM | POA: Diagnosis not present

## 2015-05-28 DIAGNOSIS — I10 Essential (primary) hypertension: Secondary | ICD-10-CM

## 2015-05-28 MED ORDER — GABAPENTIN 300 MG PO CAPS: ORAL_CAPSULE | ORAL | Status: DC

## 2015-05-28 MED ORDER — CHLORTHALIDONE 25 MG PO TABS: 12.5000 mg | ORAL_TABLET | Freq: Every day | ORAL | Status: DC

## 2015-05-28 MED ORDER — LOSARTAN POTASSIUM 50 MG PO TABS: 50.0000 mg | ORAL_TABLET | Freq: Every day | ORAL | Status: DC

## 2015-05-28 NOTE — Addendum Note (Signed)
Addended by: Steele Sizer F on: 05/28/2015 03:13 PM   Modules accepted: Orders

## 2015-05-28 NOTE — Progress Notes (Addendum)
Name: Catherine Burgess   MRN: [id number]    DOB: 1935/11/04   Date:05/28/2015       Progress Note  Subjective  Chief Complaint  Chief Complaint  Patient presents with  . Fall    pt had a fall last night at home    HPI  Syncope : she got home from work yesterday at 9 pm, took a shower, rested in her bed and dozed off, got up around 10 pm and went to the kitchen to take evening medications, grabbed her pills, and turned to get some water and just remembers waking up on the floor. She is not sure how long she was out, but it was not long. She denies chest pain, but was sweaty when she woke up, no nausea, vomiting, no dizziness, no bowel or bladder incontinence. She got up slowly and went back to bed. She was by herself at home when it happened. She called her son but could not get a hold off him. She was recently seen by cardiologist for palpitation and had a stress test and is going back to see Dr. Clayborn Bigness for results this Thursday.  She denies chest pain or palpitation at this time.    Patient Active Problem List   Diagnosis Date Noted  . H/O total knee replacement 01/20/2015  . Insect bite 10/17/2014  . Vaginal atrophy 09/26/2014  . Arthritis, degenerative 07/31/2014  . Esophagitis 07/31/2014  . HLD (hyperlipidemia) 07/31/2014  . BP (high blood pressure) 07/31/2014  . History of artificial joint 05/18/2013    No past surgical history on file.  No family history on file.  Social History   Social History  . Marital Status: Widowed    Spouse Name: N/A  . Number of Children: N/A  . Years of Education: N/A   Occupational History  . Not on file.   Social History Main Topics  . Smoking status: Never Smoker   . Smokeless tobacco: Not on file  . Alcohol Use: No  . Drug Use: No  . Sexual Activity: Not on file   Other Topics Concern  . Not on file   Social History Narrative     Current outpatient prescriptions:  .  atorvastatin (LIPITOR) 40 MG tablet, , Disp: , Rfl:  0 .  chlorthalidone (HYGROTON) 25 MG tablet, Take 0.5 tablets (12.5 mg total) by mouth daily., Disp: 30 tablet, Rfl: 0 .  conjugated estrogens (PREMARIN) vaginal cream, Place 1 Applicatorful vaginally once a week. Apply 1 dime-sized amount to external vaginal area 1-2 times weekly., Disp: 42.5 g, Rfl: 12 .  gabapentin (NEURONTIN) 300 MG capsule, take 1 capsule by mouth two times a day, Disp: 90 capsule, Rfl: 2 .  losartan (COZAAR) 50 MG tablet, Take 1 tablet (50 mg total) by mouth daily., Disp: 30 tablet, Rfl: 0 .  meloxicam (MOBIC) 15 MG tablet, Take 1 tablet (15 mg total) by mouth daily. Increase to 15 mg qd, Disp: 90 tablet, Rfl: 1 .  pantoprazole (PROTONIX) 40 MG tablet, Take 40 mg by mouth daily. , Disp: , Rfl: 1 .  traMADol-acetaminophen (ULTRACET) 37.5-325 MG tablet, Take 1 tablet by mouth every 8 (eight) hours as needed., Disp: 90 tablet, Rfl: 0  Allergies  Allergen Reactions  . Lovastatin Other (See Comments)    chest  . Pravastatin     Other reaction(s): Muscle Pain chest  . Rosuvastatin     Other reaction(s): Muscle Pain chest Other reaction(s): Muscle Pain chest  . Statins  Other reaction(s): Muscle Pain chest     ROS  Ten systems reviewed and is negative except as mentioned in HPI   Objective  Filed Vitals:   05/28/15 1427  BP: 118/66  Pulse: 86  Temp: 98.1 F (36.7 C)  Resp: 18  Weight: 141 lb 4 oz (64.071 kg)  SpO2: 97%    Body mass index is 25.03 kg/(m^2).  Physical Exam  Constitutional: Patient appears well-developed and well-nourished. Obese  No distress.  HEENT: head atraumatic, normocephalic, pupils equal and reactive to light, ears normal  neck supple, throat within normal limits Cardiovascular: Normal rate, regular rhythm and normal heart sounds.  No murmur heard. No BLE edema. Negative for carotid bruit Pulmonary/Chest: Effort normal and breath sounds normal. No respiratory distress. Abdominal: Soft.  There is no tenderness. Psychiatric:  Patient has a normal mood and affect. behavior is normal. Judgment and thought content normal. Muscular Skeletal: antalgic gait, left leg seems shorter than right side, history of knee replacement. No bruises.  Neurological: normal exam, Romberg negative  PHQ2/9: Depression screen [facility] 2/9 05/28/2015 12/26/2014 10/17/2014 07/31/2014  Decreased Interest 0 0 0 0  Down, Depressed, Hopeless 0 0 0 0  PHQ - 2 Score 0 0 0 0     Fall Risk: Fall Risk  05/28/2015 12/26/2014 10/17/2014 07/31/2014  Falls in the past year? Yes No No No  Number falls in past yr: 1 - - -  Injury with Fall? No - - -  Follow up Falls evaluation completed - - -     Functional Status Survey: Is the patient deaf or have difficulty hearing?: No Does the patient have difficulty seeing, even when wearing glasses/contacts?: Yes Does the patient have difficulty concentrating, remembering, or making decisions?: No Does the patient have difficulty walking or climbing stairs?: Yes Does the patient have difficulty dressing or bathing?: No Does the patient have difficulty doing errands alone such as visiting a doctor's office or shopping?: No    Assessment & Plan  1. Syncope, unspecified syncope type  Possible orthostatic hypotension, we will check labs, decrease blood pressure medication. Explained the need to call 911 if it happens again. Follow up with Dr. Clayborn Bigness , getting results for evaluation of palpitation this week.  - CBC with Differential/Platelet - Comprehensive metabolic panel - Troponin I - CK Total (and CKMB) - US Carotid Duplex Bilateral; Future  2. Essential hypertension  - chlorthalidone (HYGROTON) 25 MG tablet; Take 0.5 tablets (12.5 mg total) by mouth daily.  Dispense: 30 tablet; Refill: 0 - losartan (COZAAR) 50 MG tablet; Take 1 tablet (50 mg total) by mouth daily.  Dispense: 30 tablet; Refill: 0  3. Diaphoresis  When she woke up after syncope - Troponin I - CK Total (and CKMB)

## 2015-05-29 ENCOUNTER — Other Ambulatory Visit: Payer: Self-pay | Admitting: Family Medicine

## 2015-05-29 DIAGNOSIS — R739 Hyperglycemia, unspecified: Secondary | ICD-10-CM

## 2015-05-29 LAB — CBC WITH DIFFERENTIAL/PLATELET
BASOS ABS: 0.1 10*3/uL (ref 0.0–0.2)
Basos: 1 %
EOS (ABSOLUTE): 0.2 10*3/uL (ref 0.0–0.4)
Eos: 3 %
Hematocrit: 40.1 % (ref 34.0–46.6)
Hemoglobin: 13.3 g/dL (ref 11.1–15.9)
Immature Grans (Abs): 0 10*3/uL (ref 0.0–0.1)
Immature Granulocytes: 0 %
LYMPHS ABS: 2.1 10*3/uL (ref 0.7–3.1)
Lymphs: 28 %
MCH: 28.1 pg (ref 26.6–33.0)
MCHC: 33.2 g/dL (ref 31.5–35.7)
MCV: 85 fL (ref 79–97)
MONOCYTES: 7 %
MONOS ABS: 0.5 10*3/uL (ref 0.1–0.9)
NEUTROS PCT: 61 %
Neutrophils Absolute: 4.7 10*3/uL (ref 1.4–7.0)
Platelets: 358 10*3/uL (ref 150–379)
RBC: 4.74 x10E6/uL (ref 3.77–5.28)
RDW: 14.4 % (ref 12.3–15.4)
WBC: 7.6 10*3/uL (ref 3.4–10.8)

## 2015-05-29 LAB — CK TOTAL AND CKMB (NOT AT ARMC)
CK TOTAL: 140 U/L (ref 24–173)
CK-MB Index: 3.5 ng/mL (ref 0.0–5.3)

## 2015-05-29 LAB — COMPREHENSIVE METABOLIC PANEL
ALK PHOS: 136 IU/L — AB (ref 39–117)
ALT: 16 IU/L (ref 0–32)
AST: 22 IU/L (ref 0–40)
Albumin/Globulin Ratio: 1.7 (ref 1.2–2.2)
Albumin: 4.5 g/dL (ref 3.5–4.8)
BUN/Creatinine Ratio: 24 (ref 12–28)
BUN: 21 mg/dL (ref 8–27)
Bilirubin Total: 0.6 mg/dL (ref 0.0–1.2)
CO2: 28 mmol/L (ref 18–29)
CREATININE: 0.88 mg/dL (ref 0.57–1.00)
Calcium: 10.1 mg/dL (ref 8.7–10.3)
Chloride: 98 mmol/L (ref 96–106)
GFR calc Af Amer: 73 mL/min/{1.73_m2} (ref >59)
GFR calc non Af Amer: 64 mL/min/{1.73_m2} (ref >59)
GLUCOSE: 106 mg/dL — AB (ref 65–99)
Globulin, Total: 2.7 g/dL (ref 1.5–4.5)
Potassium: 3.9 mmol/L (ref 3.5–5.2)
Sodium: 144 mmol/L (ref 134–144)
Total Protein: 7.2 g/dL (ref 6.0–8.5)

## 2015-05-29 LAB — TROPONIN I: Troponin I: <0.01 ng/mL (ref 0.00–0.04)

## 2015-05-30 DIAGNOSIS — R079 Chest pain, unspecified: Secondary | ICD-10-CM | POA: Diagnosis not present

## 2015-05-30 DIAGNOSIS — R55 Syncope and collapse: Secondary | ICD-10-CM | POA: Diagnosis not present

## 2015-05-30 DIAGNOSIS — I10 Essential (primary) hypertension: Secondary | ICD-10-CM | POA: Diagnosis not present

## 2015-05-30 DIAGNOSIS — M1990 Unspecified osteoarthritis, unspecified site: Secondary | ICD-10-CM | POA: Diagnosis not present

## 2015-05-30 DIAGNOSIS — R0602 Shortness of breath: Secondary | ICD-10-CM | POA: Diagnosis not present

## 2015-05-31 LAB — SPECIMEN STATUS REPORT

## 2015-05-31 LAB — HGB A1C W/O EAG: Hgb A1c MFr Bld: 6.4 % — ABNORMAL HIGH (ref 4.8–5.6)

## 2015-06-03 ENCOUNTER — Encounter: Payer: Self-pay | Admitting: Family Medicine

## 2015-06-05 ENCOUNTER — Ambulatory Visit
Admission: RE | Admit: 2015-06-05 | Discharge: 2015-06-05 | Disposition: A | Discharge status: Home / Self Care | Payer: Medicare Other | Source: Ambulatory Visit | Attending: Family Medicine | Admitting: Family Medicine

## 2015-06-05 DIAGNOSIS — R55 Syncope and collapse: Secondary | ICD-10-CM | POA: Insufficient documentation

## 2015-06-10 ENCOUNTER — Telehealth: Payer: Self-pay

## 2015-06-10 NOTE — Telephone Encounter (Signed)
Unable to leave message since there was no voicemail set up.

## 2015-06-10 NOTE — Telephone Encounter (Signed)
-----   Message from Steele Sizer, MD sent at 06/05/2015  9:03 PM EDT ----- No significant stenosis

## 2015-06-13 DIAGNOSIS — H35412 Lattice degeneration of retina, left eye: Secondary | ICD-10-CM | POA: Diagnosis not present

## 2015-06-26 ENCOUNTER — Ambulatory Visit (INDEPENDENT_AMBULATORY_CARE_PROVIDER_SITE_OTHER): Payer: Medicare Other | Admitting: Family Medicine

## 2015-06-26 ENCOUNTER — Encounter: Payer: Self-pay | Admitting: Family Medicine

## 2015-06-26 VITALS — BP 118/68 | HR 78 | Temp 98.6°F | Resp 16 | Wt 142.2 lb

## 2015-06-26 DIAGNOSIS — I10 Essential (primary) hypertension: Secondary | ICD-10-CM | POA: Diagnosis not present

## 2015-06-26 DIAGNOSIS — R739 Hyperglycemia, unspecified: Secondary | ICD-10-CM | POA: Diagnosis not present

## 2015-06-26 DIAGNOSIS — R55 Syncope and collapse: Secondary | ICD-10-CM

## 2015-06-26 DIAGNOSIS — E785 Hyperlipidemia, unspecified: Secondary | ICD-10-CM | POA: Diagnosis not present

## 2015-06-26 DIAGNOSIS — Z9660 Presence of unspecified orthopedic joint implant: Secondary | ICD-10-CM

## 2015-06-26 DIAGNOSIS — M542 Cervicalgia: Secondary | ICD-10-CM

## 2015-06-26 DIAGNOSIS — M170 Bilateral primary osteoarthritis of knee: Secondary | ICD-10-CM

## 2015-06-26 MED ORDER — TRAMADOL-ACETAMINOPHEN 37.5-325 MG PO TABS: 1.0000 | ORAL_TABLET | Freq: Three times a day (TID) | ORAL | Status: DC | PRN

## 2015-06-26 MED ORDER — ACETAMINOPHEN 500 MG PO TABS: 500.0000 mg | ORAL_TABLET | Freq: Two times a day (BID) | ORAL | Status: DC

## 2015-06-26 MED ORDER — LOSARTAN POTASSIUM 50 MG PO TABS: 50.0000 mg | ORAL_TABLET | Freq: Every day | ORAL | Status: DC

## 2015-06-26 MED ORDER — GABAPENTIN 300 MG PO CAPS: ORAL_CAPSULE | ORAL | Status: DC

## 2015-06-26 MED ORDER — MELOXICAM 7.5 MG PO TABS: 7.5000 mg | ORAL_TABLET | Freq: Every day | ORAL | Status: DC | PRN

## 2015-06-26 MED ORDER — ASPIRIN EC 81 MG PO TBEC: 81.0000 mg | DELAYED_RELEASE_TABLET | Freq: Every day | ORAL | Status: DC

## 2015-06-26 NOTE — Progress Notes (Signed)
Name: Catherine Burgess   MRN: [id number]    DOB: [birth year]   Date:06/26/2015       Progress Note  Subjective  Chief Complaint  Chief Complaint  Patient presents with  . Follow-up    patient is here for her 29-month f/u regarding syncope epidsode. she stated that things are going well.  . Hand Problem    patient got some battery acid on her hands while changing the batteries of her remote control about 2 weeks ago.     HPI  Syncope : she had a syncopal episode in April 2017. She was evaluated with myoview, labs and carotid doppler that were all normal. She is feeling well. We decreased dose of Chlorthalidone but bp is still low and we will discontinue medication today.   HTN :well controlled, but because of low bp we will stop diuretic, continue Losartan . No longer has chest pain, normal evaluation by Dr. Clayborn Bigness. No SOB or palpitation  Hyperlipidemia: taking Atorvastatin, last labs done in 12/2014 and LDL was at goal  OA: both knees, has daily pain, currently taking Meloxicam and Gabapentin. Denies side effects except for drowsiness with gabapentin if she takes more than two daily. Pain is described as aching, constant and causes an antalgic gait. Pain can be mild 2-3 but can go up to 10. Advised Ultracet only at night, try to wean off Meloxicam and take Tylenol during the day.   Hand problem: exposed to battery that was leaking, but symptoms resolve now.   Hyperglycemia: she denies polyphagia, polyuria or polydipsia, she is trying to stop sweets   Neck pain: doing better, no radiculitis, taking gabapentin and Ultracet   Patient Active Problem List   Diagnosis Date Noted  . H/O total knee replacement 01/20/2015  . Vaginal atrophy 09/26/2014  . Arthritis, degenerative 07/31/2014  . Esophagitis 07/31/2014  . HLD (hyperlipidemia) 07/31/2014  . BP (high blood pressure) 07/31/2014  . History of artificial joint 05/18/2013    Past Surgical History  Procedure Laterality Date  .  Replacement total knee Left 11/12/2009  . Total hip arthroplasty Left 11/12/2009    Family History  Problem Relation Age of Onset  . CAD Mother   . Emphysema Father     Social History   Social History  . Marital Status: Single    Spouse Name: N/A  . Number of Children: N/A  . Years of Education: N/A   Occupational History  . Not on file.   Social History Main Topics  . Smoking status: Never Smoker   . Smokeless tobacco: Not on file  . Alcohol Use: No  . Drug Use: No  . Sexual Activity: Not on file   Other Topics Concern  . Not on file   Social History Narrative     Current outpatient prescriptions:  .  acetaminophen (TYLENOL) 500 MG tablet, Take 1 tablet (500 mg total) by mouth 2 (two) times daily., Disp: 180 tablet, Rfl: 0 .  aspirin EC 81 MG tablet, Take 1 tablet (81 mg total) by mouth daily., Disp: 30 tablet, Rfl: 0 .  atorvastatin (LIPITOR) 40 MG tablet, take 1 tablet by mouth once daily, Disp: 90 tablet, Rfl: 1 .  conjugated estrogens (PREMARIN) vaginal cream, Place 1 Applicatorful vaginally once a week. Apply 1 dime-sized amount to external vaginal area 1-2 times weekly., Disp: 42.5 g, Rfl: 12 .  gabapentin (NEURONTIN) 300 MG capsule, take 1 capsule by mouth two times a day, Disp: 60 capsule, Rfl: 2 .  losartan (COZAAR) 50 MG tablet, Take 1 tablet (50 mg total) by mouth daily., Disp: 90 tablet, Rfl: 0 .  meloxicam (MOBIC) 7.5 MG tablet, Take 1 tablet (7.5 mg total) by mouth daily as needed for pain. Increase to 15 mg qd, Disp: 90 tablet, Rfl: 0 .  pantoprazole (PROTONIX) 40 MG tablet, take 1 tablet by mouth once daily, Disp: 90 tablet, Rfl: 1 .  traMADol-acetaminophen (ULTRACET) 37.5-325 MG tablet, Take 1 tablet by mouth every 8 (eight) hours as needed., Disp: 90 tablet, Rfl: 0  Allergies  Allergen Reactions  . Lovastatin Other (See Comments)    chest  . Pravastatin     Other reaction(s): Muscle Pain chest  . Rosuvastatin     Other reaction(s): Muscle  Pain chest Other reaction(s): Muscle Pain chest  . Statins     Other reaction(s): Muscle Pain chest     ROS  Constitutional: Negative for fever or weight change.  Respiratory: Negative for cough and shortness of breath.   Cardiovascular: Negative for chest pain or palpitations.  Gastrointestinal: Negative for abdominal pain, no bowel changes.  Musculoskeletal: Positive for gait problem or joint swelling.  Skin: Negative for rash.  Neurological: Negative for dizziness or headache.  No other specific complaints in a complete review of systems (except as listed in HPI above).  Objective  Filed Vitals:   06/26/15 1013  BP: 118/68  Pulse: 78  Temp: 98.6 F (37 C)  TempSrc: Oral  Resp: 16  Weight: 142 lb 3.2 oz (64.501 kg)  SpO2: 98%    Body mass index is 25.2 kg/(m^2).  Physical Exam  Constitutional: Patient appears well-developed and well-nourished. O No distress.  HEENT: head atraumatic, normocephalic, pupils equal and reactive to light,  neck supple, throat within normal limits Cardiovascular: Normal rate, regular rhythm and normal heart sounds.  No murmur heard. No BLE edema. Pulmonary/Chest: Effort normal and breath sounds normal. No respiratory distress. Abdominal: Soft.  There is no tenderness. Psychiatric: Patient has a normal mood and affect. behavior is normal. Judgment and thought content normal. Muscular Skeletal normal rom of neck, crepitus with extension of both knees, antalgic gait  Recent Results (from the past 2160 hour(s))  CBC with Differential/Platelet     Status: None   Collection Time: 05/28/15  3:53 PM  Result Value Ref Range   WBC 7.6 3.4 - 10.8 x10E3/uL   RBC 4.74 3.77 - 5.28 x10E6/uL   Hemoglobin 13.3 11.1 - 15.9 g/dL   Hematocrit 40.1 34.0 - 46.6 %   MCV 85 79 - 97 fL   MCH 28.1 26.6 - 33.0 pg   MCHC 33.2 31.5 - 35.7 g/dL   RDW 14.4 12.3 - 15.4 %   Platelets 358 150 - 379 x10E3/uL   Neutrophils 61 %   Lymphs 28 %   Monocytes 7 %    Eos 3 %   Basos 1 %   Neutrophils Absolute 4.7 1.4 - 7.0 x10E3/uL   Lymphocytes Absolute 2.1 0.7 - 3.1 x10E3/uL   Monocytes Absolute 0.5 0.1 - 0.9 x10E3/uL   EOS (ABSOLUTE) 0.2 0.0 - 0.4 x10E3/uL   Basophils Absolute 0.1 0.0 - 0.2 x10E3/uL   Immature Granulocytes 0 %   Immature Grans (Abs) 0.0 0.0 - 0.1 x10E3/uL  Comprehensive metabolic panel     Status: Abnormal   Collection Time: 05/28/15  3:53 PM  Result Value Ref Range   Glucose 106 (H) 65 - 99 mg/dL   BUN 21 8 - 27 mg/dL   Creatinine,  Ser 0.88 0.57 - 1.00 mg/dL   GFR calc non Af Amer 64 >59 mL/min/1.73   GFR calc Af Amer 73 >59 mL/min/1.73   BUN/Creatinine Ratio 24 12 - 28   Sodium 144 134 - 144 mmol/L   Potassium 3.9 3.5 - 5.2 mmol/L   Chloride 98 96 - 106 mmol/L   CO2 28 18 - 29 mmol/L   Calcium 10.1 8.7 - 10.3 mg/dL   Total Protein 7.2 6.0 - 8.5 g/dL   Albumin 4.5 3.5 - 4.8 g/dL   Globulin, Total 2.7 1.5 - 4.5 g/dL   Albumin/Globulin Ratio 1.7 1.2 - 2.2   Bilirubin Total 0.6 0.0 - 1.2 mg/dL   Alkaline Phosphatase 136 (H) 39 - 117 IU/L   AST 22 0 - 40 IU/L   ALT 16 0 - 32 IU/L  CK total and CKMB (cardiac)not at [facility]     Status: None   Collection Time: 05/28/15  3:53 PM  Result Value Ref Range   Total CK 140 24 - 173 U/L   CK-MB Index 3.5 0.0 - 5.3 ng/mL  Troponin I     Status: None   Collection Time: 05/28/15  3:53 PM  Result Value Ref Range   Troponin I <0.01 0.00 - 0.04 ng/mL  Hgb A1c w/o eAG     Status: Abnormal   Collection Time: 05/28/15  3:53 PM  Result Value Ref Range   Hgb A1c MFr Bld 6.4 (H) 4.8 - 5.6 %    Comment:          Pre-diabetes: 5.7 - 6.4          Diabetes: >6.4          Glycemic control for adults with diabetes: <7.0   Specimen status report     Status: None   Collection Time: 05/28/15  3:53 PM  Result Value Ref Range   specimen status report Comment     Comment: Written Authorization Written Authorization Written Authorization Received. Authorization received from [place]  05-31-2015 Logged by Lenice Llamas     PHQ2/9: Depression screen [facility] 2/9 06/26/2015 05/28/2015 12/26/2014 10/17/2014 07/31/2014  Decreased Interest 0 0 0 0 0  Down, Depressed, Hopeless 0 0 0 0 0  PHQ - 2 Score 0 0 0 0 0    Fall Risk: Fall Risk  06/26/2015 05/28/2015 12/26/2014 10/17/2014 07/31/2014  Falls in the past year? Yes Yes No No No  Number falls in past yr: 1 1 - - -  Injury with Fall? Yes No - - -  Follow up - Falls evaluation completed - - -    Functional Status Survey: Is the patient deaf or have difficulty hearing?: No Does the patient have difficulty seeing, even when wearing glasses/contacts?: Yes Does the patient have difficulty concentrating, remembering, or making decisions?: No Does the patient have difficulty walking or climbing stairs?: Yes Does the patient have difficulty dressing or bathing?: No Does the patient have difficulty doing errands alone such as visiting a doctor's office or shopping?: No    Assessment & Plan  1. Essential hypertension  Stop diuretic, monitor bp - losartan (COZAAR) 50 MG tablet; Take 1 tablet (50 mg total) by mouth daily.  Dispense: 90 tablet; Refill: 0  2. Syncope, unspecified syncope type  Likely from hypotension, normal evaluation so far, no recurrence  3. HLD (hyperlipidemia)  Continue medication   4. Osteoarthritis of both knees, unspecified osteoarthritis type  Decrease dose of meloxicam and try to stop it -  meloxicam (MOBIC) 7.5 MG tablet; Take 1 tablet (7.5 mg total) by mouth daily as needed for pain. Increase to 15 mg qd  Dispense: 90 tablet; Refill: 0 - traMADol-acetaminophen (ULTRACET) 37.5-325 MG tablet; Take 1 tablet by mouth every 8 (eight) hours as needed.  Dispense: 90 tablet; Refill: 0 - gabapentin (NEURONTIN) 300 MG capsule; take 1 capsule by mouth two times a day  Dispense: 60 capsule; Refill: 2 - acetaminophen (TYLENOL) 500 MG tablet; Take 1 tablet (500 mg total) by mouth 2 (two) times daily.  Dispense: 180  tablet; Refill: 0  5. History of artificial joint   6. Neck pain  - gabapentin (NEURONTIN) 300 MG capsule; take 1 capsule by mouth two times a day  Dispense: 60 capsule; Refill: 2  7. Hyperglycemia  Discussed importance of dietary modification

## 2015-07-10 DIAGNOSIS — L308 Other specified dermatitis: Secondary | ICD-10-CM | POA: Diagnosis not present

## 2015-07-11 ENCOUNTER — Other Ambulatory Visit: Payer: Self-pay | Admitting: Family Medicine

## 2015-07-12 NOTE — Telephone Encounter (Signed)
Patient requesting refill. 

## 2015-07-15 ENCOUNTER — Telehealth: Payer: Self-pay | Admitting: Family Medicine

## 2015-07-15 NOTE — Telephone Encounter (Signed)
Pt states she wants to know if there is a lab that she can get done to check to see if the acid that she has on her hands has gotten into her blood stream. Please advise.

## 2015-07-15 NOTE — Telephone Encounter (Signed)
No. I am sorry

## 2015-07-16 NOTE — Telephone Encounter (Signed)
Pt informed

## 2015-08-01 DIAGNOSIS — M7989 Other specified soft tissue disorders: Secondary | ICD-10-CM | POA: Diagnosis not present

## 2015-09-10 DIAGNOSIS — Z96652 Presence of left artificial knee joint: Secondary | ICD-10-CM | POA: Diagnosis not present

## 2015-09-10 DIAGNOSIS — M1711 Unilateral primary osteoarthritis, right knee: Secondary | ICD-10-CM | POA: Diagnosis not present

## 2015-09-11 DIAGNOSIS — M79671 Pain in right foot: Secondary | ICD-10-CM | POA: Diagnosis not present

## 2015-10-10 ENCOUNTER — Telehealth: Payer: Self-pay | Admitting: Family Medicine

## 2015-10-10 NOTE — Telephone Encounter (Signed)
I have not seen or evaluated this patient in the past. Please schedule an appointment for medication refill

## 2015-10-10 NOTE — Telephone Encounter (Signed)
Left voice message for patient to return my call.

## 2015-10-11 ENCOUNTER — Other Ambulatory Visit: Payer: Self-pay | Admitting: Family Medicine

## 2015-10-11 DIAGNOSIS — M1711 Unilateral primary osteoarthritis, right knee: Secondary | ICD-10-CM | POA: Diagnosis not present

## 2015-10-11 MED ORDER — ATORVASTATIN CALCIUM 40 MG PO TABS: 40.0000 mg | ORAL_TABLET | Freq: Every day | ORAL | 0 refills | Status: DC

## 2015-10-11 NOTE — Telephone Encounter (Signed)
Please advise 

## 2015-10-28 ENCOUNTER — Encounter: Payer: Self-pay | Admitting: Family Medicine

## 2015-10-28 ENCOUNTER — Ambulatory Visit (INDEPENDENT_AMBULATORY_CARE_PROVIDER_SITE_OTHER): Payer: Medicare Other | Admitting: Family Medicine

## 2015-10-28 VITALS — BP 148/76 | HR 86 | Temp 98.3°F | Resp 16 | Ht 63.5 in | Wt 140.1 lb

## 2015-10-28 DIAGNOSIS — E785 Hyperlipidemia, unspecified: Secondary | ICD-10-CM | POA: Diagnosis not present

## 2015-10-28 DIAGNOSIS — Z780 Asymptomatic menopausal state: Secondary | ICD-10-CM | POA: Insufficient documentation

## 2015-10-28 DIAGNOSIS — M810 Age-related osteoporosis without current pathological fracture: Secondary | ICD-10-CM

## 2015-10-28 DIAGNOSIS — Z23 Encounter for immunization: Secondary | ICD-10-CM | POA: Diagnosis not present

## 2015-10-28 DIAGNOSIS — Z9660 Presence of unspecified orthopedic joint implant: Secondary | ICD-10-CM | POA: Diagnosis not present

## 2015-10-28 DIAGNOSIS — N951 Menopausal and female climacteric states: Secondary | ICD-10-CM | POA: Insufficient documentation

## 2015-10-28 DIAGNOSIS — K219 Gastro-esophageal reflux disease without esophagitis: Secondary | ICD-10-CM | POA: Diagnosis not present

## 2015-10-28 DIAGNOSIS — I10 Essential (primary) hypertension: Secondary | ICD-10-CM | POA: Diagnosis not present

## 2015-10-28 DIAGNOSIS — M8589 Other specified disorders of bone density and structure, multiple sites: Secondary | ICD-10-CM | POA: Insufficient documentation

## 2015-10-28 DIAGNOSIS — M542 Cervicalgia: Secondary | ICD-10-CM | POA: Diagnosis not present

## 2015-10-28 DIAGNOSIS — E559 Vitamin D deficiency, unspecified: Secondary | ICD-10-CM | POA: Insufficient documentation

## 2015-10-28 DIAGNOSIS — M5090 Cervical disc disorder, unspecified, unspecified cervical region: Secondary | ICD-10-CM | POA: Insufficient documentation

## 2015-10-28 DIAGNOSIS — M170 Bilateral primary osteoarthritis of knee: Secondary | ICD-10-CM

## 2015-10-28 DIAGNOSIS — R739 Hyperglycemia, unspecified: Secondary | ICD-10-CM

## 2015-10-28 MED ORDER — ATORVASTATIN CALCIUM 40 MG PO TABS: 40.0000 mg | ORAL_TABLET | Freq: Every day | ORAL | 0 refills | Status: DC

## 2015-10-28 MED ORDER — GABAPENTIN 300 MG PO CAPS
ORAL_CAPSULE | ORAL | 1 refills | Status: DC
Start: 2015-10-28 — End: 2016-03-05

## 2015-10-28 MED ORDER — PANTOPRAZOLE SODIUM 40 MG PO TBEC: 40.0000 mg | DELAYED_RELEASE_TABLET | Freq: Every day | ORAL | 1 refills | Status: DC

## 2015-10-28 MED ORDER — RANITIDINE HCL 150 MG PO TABS: 150.0000 mg | ORAL_TABLET | Freq: Two times a day (BID) | ORAL | 0 refills | Status: DC

## 2015-10-28 MED ORDER — ACETAMINOPHEN 500 MG PO TABS: 500.0000 mg | ORAL_TABLET | Freq: Two times a day (BID) | ORAL | 0 refills | Status: AC

## 2015-10-28 NOTE — Progress Notes (Signed)
Name: Catherine Burgess   MRN: [id number]    DOB: [birth year]   Date:10/28/2015       Progress Note  Subjective  Chief Complaint  Chief Complaint  Patient presents with  . Medication Refill    4 month F/U  . Hyperlipidemia  . Hypertension  . Osteoarthritis    Unchanged, but has had knee injections from Dr. Rogers Blocker and takes medication prn for the pain    HPI  HTN :well controlled,  continue Losartan, off diuretic and denies orthostatic changes, I explained that for her age 80's is safer than 120's. No longer has chest pain, normal evaluation by Dr. Clayborn Bigness. No SOB or palpitation. She states her bp still below 140's at home.   Hyperlipidemia: taking Atorvastatin, last labs done in 12/2014 and LDL was at goal, we will repeat labs   OA: both knees, has daily pain, currently on Tylenol prn Ultrace and Gabapentin. Sees Dr. Marry Guan and had steroid injection right knee that is feeling better, however the left knee - s/p total knee replacement is getting painful again. Pain is described as aching, constant, wearing a soft brace.    Hyperglycemia: she denies polyphagia, polyuria, she states she has polyuria. She is still craving sweets.   Neck pain: doing better, no radiculitis, taking gabapentin and Ultracet.   GERD: no symptoms at this time, denies regurgitation or heartburn, discussed possible long term problems with PPI and she is willing to try coming off medication  Patient Active Problem List   Diagnosis Date Noted  . Vitamin D deficiency 10/28/2015  . Post menopausal syndrome 10/28/2015  . OP (osteoporosis) 10/28/2015  . Cervical disc disease 10/28/2015  . H/O total knee replacement 01/20/2015  . Vaginal atrophy 09/26/2014  . Osteoarthritis of both knees 07/31/2014  . GERD without esophagitis 07/31/2014  . HLD (hyperlipidemia) 07/31/2014  . Hypertension, benign 07/31/2014  . History of artificial joint 05/18/2013    Past Surgical History:  Procedure Laterality Date  .  REPLACEMENT TOTAL KNEE Left 11/12/2009  . TOTAL HIP ARTHROPLASTY Left 11/12/2009    Family History  Problem Relation Age of Onset  . CAD Mother   . Emphysema Father     Social History   Social History  . Marital status: Single    Spouse name: N/A  . Number of children: N/A  . Years of education: N/A   Occupational History  . Not on file.   Social History Main Topics  . Smoking status: Never Smoker  . Smokeless tobacco: Never Used  . Alcohol use No  . Drug use: No  . Sexual activity: Not Currently   Other Topics Concern  . Not on file   Social History Narrative  . No narrative on file     Current Outpatient Prescriptions:  .  acetaminophen (TYLENOL) 500 MG tablet, Take 1 tablet (500 mg total) by mouth 2 (two) times daily., Disp: 180 tablet, Rfl: 0 .  aspirin EC 81 MG tablet, Take 1 tablet (81 mg total) by mouth daily., Disp: 30 tablet, Rfl: 0 .  atorvastatin (LIPITOR) 40 MG tablet, Take 1 tablet (40 mg total) by mouth daily., Disp: 90 tablet, Rfl: 0 .  Calcium-Vitamin D 600-200 MG-UNIT tablet, Take by mouth., Disp: , Rfl:  .  gabapentin (NEURONTIN) 300 MG capsule, take 1 capsule by mouth two times a day, Disp: 180 capsule, Rfl: 1 .  losartan (COZAAR) 50 MG tablet, take 1 tablet by mouth once daily, Disp: 90 tablet, Rfl: 1 .  pantoprazole (PROTONIX) 40 MG tablet, Take 1 tablet (40 mg total) by mouth daily., Disp: 90 tablet, Rfl: 1 .  traMADol-acetaminophen (ULTRACET) 37.5-325 MG tablet, Take 1 tablet by mouth every 8 (eight) hours as needed., Disp: 90 tablet, Rfl: 0  Allergies  Allergen Reactions  . Lovastatin Other (See Comments)    chest  . Pravastatin     Other reaction(s): Muscle Pain chest  . Rosuvastatin     Other reaction(s): Muscle Pain chest Other reaction(s): Muscle Pain chest  . Statins     Other reaction(s): Muscle Pain chest     ROS  Constitutional: Negative for fever or weight change.  Respiratory: Negative for cough and shortness of  breath.   Cardiovascular: Negative for chest pain or palpitations.  Gastrointestinal: Negative for abdominal pain, no bowel changes.  Musculoskeletal: Positive  for gait problem or joint swelling.  Skin: Negative for rash.  Neurological: Negative for dizziness or headache.  No other specific complaints in a complete review of systems (except as listed in HPI above).  Objective  Vitals:   10/28/15 0951  BP: (!) 148/76  Pulse: 86  Resp: 16  Temp: 98.3 F (36.8 C)  TempSrc: Oral  SpO2: 95%  Weight: 140 lb 1.6 oz (63.5 kg)  Height: 5' 3.5" (1.613 m)    Body mass index is 24.43 kg/m.  Physical Exam  Constitutional: Patient appears well-developed and well-nourished.  No distress.  HEENT: head atraumatic, normocephalic, pupils equal and reactive to light, neck supple, throat within normal limits Cardiovascular: Normal rate, regular rhythm and normal heart sounds.  No murmur heard. No BLE edema. Pulmonary/Chest: Effort normal and breath sounds normal. No respiratory distress. Abdominal: Soft.  There is no tenderness. Psychiatric: Patient has a normal mood and affect. behavior is normal. Judgment and thought content normal. Muscular Skeletal: decrease rom of left hip, left knee has decrease rom, crepitus with extension of right knee  PHQ2/9: Depression screen [facility] 2/9 10/28/2015 06/26/2015 05/28/2015 12/26/2014 10/17/2014  Decreased Interest 0 0 0 0 0  Down, Depressed, Hopeless 0 0 0 0 0  PHQ - 2 Score 0 0 0 0 0     Fall Risk: Fall Risk  10/28/2015 06/26/2015 05/28/2015 12/26/2014 10/17/2014  Falls in the past year? No Yes Yes No No  Number falls in past yr: - 1 1 - -  Injury with Fall? - Yes No - -  Follow up - - Falls evaluation completed - -     Functional Status Survey: Is the patient deaf or have difficulty hearing?: No Does the patient have difficulty seeing, even when wearing glasses/contacts?: No Does the patient have difficulty concentrating, remembering, or making  decisions?: No Does the patient have difficulty walking or climbing stairs?: No Does the patient have difficulty dressing or bathing?: No Does the patient have difficulty doing errands alone such as visiting a doctor's office or shopping?: No    Assessment & Plan  1. Essential hypertension  - COMPLETE METABOLIC PANEL WITH GFR  2. HLD (hyperlipidemia)  - Lipid panel - atorvastatin (LIPITOR) 40 MG tablet; Take 1 tablet (40 mg total) by mouth daily.  Dispense: 90 tablet; Refill: 0  3. Osteoarthritis of both knees, unspecified osteoarthritis type  - gabapentin (NEURONTIN) 300 MG capsule; take 1 capsule by mouth two times a day  Dispense: 180 capsule; Refill: 1 - acetaminophen (TYLENOL) 500 MG tablet; Take 1 tablet (500 mg total) by mouth 2 (two) times daily.  Dispense: 180 tablet; Refill: 0  4. History  of artificial joint  Left knee, also had left hip surgery   5. Hyperglycemia  - Hemoglobin A1c  6. Need for Tdap vaccination  - Tdap vaccine greater than or equal to 7yo IM -refused  7. Needs flu shot  - Flu vaccine HIGH DOSE PF -refused  8. Need for pneumococcal vaccination  - Pneumococcal polysaccharide vaccine 23-valent greater than or equal to 2yo subcutaneous/IM -refused  9. Vitamin D deficiency  - VITAMIN D 25 Hydroxy (Vit-D Deficiency, Fractures)  10. Neck pain  - gabapentin (NEURONTIN) 300 MG capsule; take 1 capsule by mouth two times a day  Dispense: 180 capsule; Refill: 1  11. GERD without esophagitis  - pantoprazole (PROTONIX) 40 MG tablet; Take 1 tablet (40 mg total) by mouth daily.  Dispense: 90 tablet; Refill: 1 - ranitidine (ZANTAC) 150 MG tablet; Take 1 tablet (150 mg total) by mouth 2 (two) times daily. To alternate with pantoprazole until you no longer need to take it  Dispense: 180 tablet; Refill: 0

## 2015-10-31 ENCOUNTER — Telehealth: Payer: Self-pay

## 2015-10-31 ENCOUNTER — Other Ambulatory Visit: Payer: Self-pay | Admitting: Family Medicine

## 2015-10-31 DIAGNOSIS — I10 Essential (primary) hypertension: Secondary | ICD-10-CM | POA: Diagnosis not present

## 2015-10-31 DIAGNOSIS — E785 Hyperlipidemia, unspecified: Secondary | ICD-10-CM | POA: Diagnosis not present

## 2015-10-31 DIAGNOSIS — E559 Vitamin D deficiency, unspecified: Secondary | ICD-10-CM | POA: Diagnosis not present

## 2015-10-31 DIAGNOSIS — R739 Hyperglycemia, unspecified: Secondary | ICD-10-CM | POA: Diagnosis not present

## 2015-10-31 DIAGNOSIS — M170 Bilateral primary osteoarthritis of knee: Secondary | ICD-10-CM

## 2015-10-31 MED ORDER — TRAMADOL-ACETAMINOPHEN 37.5-325 MG PO TABS: 1.0000 | ORAL_TABLET | Freq: Three times a day (TID) | ORAL | 0 refills | Status: DC | PRN

## 2015-10-31 NOTE — Telephone Encounter (Signed)
Pt was in office for lab work and requested a refill on her tramadol while she was here

## 2015-11-01 LAB — COMPLETE METABOLIC PANEL WITH GFR
ALT: 15 U/L (ref 6–29)
AST: 16 U/L (ref 10–35)
Albumin: 4.1 g/dL (ref 3.6–5.1)
Alkaline Phosphatase: 114 U/L (ref 33–130)
BUN: 18 mg/dL (ref 7–25)
CHLORIDE: 106 mmol/L (ref 98–110)
CO2: 25 mmol/L (ref 20–31)
Calcium: 9.5 mg/dL (ref 8.6–10.4)
Creat: 0.72 mg/dL (ref 0.60–0.93)
GFR, EST NON AFRICAN AMERICAN: 81 mL/min (ref >=60)
Glucose, Bld: 80 mg/dL (ref 65–99)
POTASSIUM: 4.4 mmol/L (ref 3.5–5.3)
Sodium: 143 mmol/L (ref 135–146)
Total Bilirubin: 0.6 mg/dL (ref 0.2–1.2)
Total Protein: 6.3 g/dL (ref 6.1–8.1)

## 2015-11-01 LAB — VITAMIN D 25 HYDROXY (VIT D DEFICIENCY, FRACTURES): Vit D, 25-Hydroxy: 36 ng/mL (ref 30–100)

## 2015-11-01 LAB — LIPID PANEL
CHOLESTEROL: 113 mg/dL — AB (ref 125–200)
HDL: 54 mg/dL (ref >=46)
LDL Cholesterol: 52 mg/dL (ref <130)
TRIGLYCERIDES: 37 mg/dL (ref <150)
Total CHOL/HDL Ratio: 2.1 Ratio (ref <=5.0)
VLDL: 7 mg/dL (ref <30)

## 2015-11-01 LAB — HEMOGLOBIN A1C
HEMOGLOBIN A1C: 5.9 % — AB (ref <5.7)
MEAN PLASMA GLUCOSE: 123 mg/dL

## 2015-11-18 ENCOUNTER — Other Ambulatory Visit: Payer: Self-pay | Admitting: Family Medicine

## 2015-11-18 DIAGNOSIS — M170 Bilateral primary osteoarthritis of knee: Secondary | ICD-10-CM

## 2015-11-26 DIAGNOSIS — E782 Mixed hyperlipidemia: Secondary | ICD-10-CM | POA: Diagnosis not present

## 2015-11-26 DIAGNOSIS — I10 Essential (primary) hypertension: Secondary | ICD-10-CM | POA: Diagnosis not present

## 2015-11-26 DIAGNOSIS — K219 Gastro-esophageal reflux disease without esophagitis: Secondary | ICD-10-CM | POA: Diagnosis not present

## 2015-11-26 DIAGNOSIS — R079 Chest pain, unspecified: Secondary | ICD-10-CM | POA: Diagnosis not present

## 2015-11-26 DIAGNOSIS — R0602 Shortness of breath: Secondary | ICD-10-CM | POA: Diagnosis not present

## 2015-12-02 ENCOUNTER — Other Ambulatory Visit: Payer: Self-pay

## 2015-12-02 DIAGNOSIS — M170 Bilateral primary osteoarthritis of knee: Secondary | ICD-10-CM

## 2015-12-09 DIAGNOSIS — R109 Unspecified abdominal pain: Secondary | ICD-10-CM | POA: Diagnosis not present

## 2015-12-09 DIAGNOSIS — R350 Frequency of micturition: Secondary | ICD-10-CM | POA: Diagnosis not present

## 2015-12-17 ENCOUNTER — Ambulatory Visit (INDEPENDENT_AMBULATORY_CARE_PROVIDER_SITE_OTHER): Payer: Medicare Other | Admitting: Family Medicine

## 2015-12-17 ENCOUNTER — Other Ambulatory Visit: Payer: Self-pay

## 2015-12-17 ENCOUNTER — Encounter: Payer: Self-pay | Admitting: Family Medicine

## 2015-12-17 VITALS — BP 132/70 | HR 89 | Temp 98.1°F | Ht 64.0 in | Wt 140.1 lb

## 2015-12-17 DIAGNOSIS — M545 Low back pain, unspecified: Secondary | ICD-10-CM

## 2015-12-17 DIAGNOSIS — N3941 Urge incontinence: Secondary | ICD-10-CM | POA: Diagnosis not present

## 2015-12-17 DIAGNOSIS — M170 Bilateral primary osteoarthritis of knee: Secondary | ICD-10-CM

## 2015-12-17 DIAGNOSIS — N3090 Cystitis, unspecified without hematuria: Secondary | ICD-10-CM | POA: Diagnosis not present

## 2015-12-17 LAB — POCT URINALYSIS DIPSTICK
Bilirubin, UA: NEGATIVE
Blood, UA: NEGATIVE
GLUCOSE UA: NEGATIVE
KETONES UA: NEGATIVE
LEUKOCYTES UA: NEGATIVE
Nitrite, UA: NEGATIVE
Protein, UA: NEGATIVE
SPEC GRAV UA: 1.015
Urobilinogen, UA: NEGATIVE
pH, UA: 5

## 2015-12-17 MED ORDER — MIRABEGRON ER 25 MG PO TB24: 25.0000 mg | ORAL_TABLET | Freq: Every day | ORAL | 2 refills | Status: DC

## 2015-12-17 NOTE — Progress Notes (Signed)
Name: Catherine Burgess   MRN: [id number]    DOB: [birth year]   Date:12/17/2015       Progress Note  Subjective  Chief Complaint  Chief Complaint  Patient presents with  . Urinary Tract Infection    follow up from urgent care was given ciprofloxacin and meloxicam    HPI   Low back pain she went to Urgent Care last week because she had low back pain, and slightly increase in urinary frequency ( she has a history of urge incontinence ) no dysuria, or fever. No nausea or vomiting, fever. She was given   Meloxicam and Cipro, she is feeling better today, back pain and hip discomfort has resolved, but still has urinary frequency - however back to baseline.    Patient Active Problem List   Diagnosis Date Noted  . Vitamin D deficiency 10/28/2015  . Post menopausal syndrome 10/28/2015  . OP (osteoporosis) 10/28/2015  . Cervical disc disease 10/28/2015  . H/O total knee replacement 01/20/2015  . Vaginal atrophy 09/26/2014  . Osteoarthritis of both knees 07/31/2014  . GERD without esophagitis 07/31/2014  . HLD (hyperlipidemia) 07/31/2014  . Hypertension, benign 07/31/2014  . History of artificial joint 05/18/2013    Past Surgical History:  Procedure Laterality Date  . REPLACEMENT TOTAL KNEE Left 11/12/2009  . TOTAL HIP ARTHROPLASTY Left 11/12/2009    Family History  Problem Relation Age of Onset  . CAD Mother   . Emphysema Father     Social History   Social History  . Marital status: Single    Spouse name: N/A  . Number of children: N/A  . Years of education: N/A   Occupational History  . Not on file.   Social History Main Topics  . Smoking status: Never Smoker  . Smokeless tobacco: Never Used  . Alcohol use No  . Drug use: No  . Sexual activity: Not Currently   Other Topics Concern  . Not on file   Social History Narrative  . No narrative on file     Current Outpatient Prescriptions:  .  acetaminophen (TYLENOL) 500 MG tablet, Take 1 tablet (500 mg total) by  mouth 2 (two) times daily., Disp: 180 tablet, Rfl: 0 .  aspirin EC 81 MG tablet, Take 1 tablet (81 mg total) by mouth daily., Disp: 30 tablet, Rfl: 0 .  atorvastatin (LIPITOR) 40 MG tablet, Take 1 tablet (40 mg total) by mouth daily., Disp: 90 tablet, Rfl: 0 .  Calcium-Vitamin D 600-200 MG-UNIT tablet, Take by mouth., Disp: , Rfl:  .  ciprofloxacin (CIPRO) 500 MG tablet, Take 500 mg by mouth 2 (two) times daily., Disp: , Rfl:  .  gabapentin (NEURONTIN) 300 MG capsule, take 1 capsule by mouth two times a day, Disp: 180 capsule, Rfl: 1 .  losartan (COZAAR) 50 MG tablet, take 1 tablet by mouth once daily, Disp: 90 tablet, Rfl: 1 .  meloxicam (MOBIC) 15 MG tablet, Take 15 mg by mouth daily., Disp: , Rfl:  .  pantoprazole (PROTONIX) 40 MG tablet, Take 1 tablet (40 mg total) by mouth daily., Disp: 90 tablet, Rfl: 1 .  ranitidine (ZANTAC) 150 MG tablet, Take 1 tablet (150 mg total) by mouth 2 (two) times daily. To alternate with pantoprazole until you no longer need to take it, Disp: 180 tablet, Rfl: 0 .  traMADol-acetaminophen (ULTRACET) 37.5-325 MG tablet, Take 1 tablet by mouth every 8 (eight) hours as needed., Disp: 90 tablet, Rfl: 0  Allergies  Allergen Reactions  .  Lovastatin Other (See Comments)    chest  . Pravastatin     Other reaction(s): Muscle Pain chest  . Rosuvastatin     Other reaction(s): Muscle Pain chest Other reaction(s): Muscle Pain chest  . Statins     Other reaction(s): Muscle Pain chest     ROS  Ten systems reviewed and is negative except as mentioned in HPI   Objective  Vitals:   12/17/15 1540  BP: 132/70  Pulse: 89  Temp: 98.1 F (36.7 C)  SpO2: 98%  Weight: 140 lb 1.6 oz (63.5 kg)  Height: 5\' 4"  (1.626 m)    Body mass index is 24.05 kg/m.  Physical Exam  Constitutional: Patient appears well-developed and well-nourished. Obese  No distress.  HEENT: head atraumatic, normocephalic, pupils equal and reactive to light,  neck supple, throat within  normal limits Cardiovascular: Normal rate, regular rhythm and normal heart sounds.  No murmur heard. No BLE edema. Pulmonary/Chest: Effort normal and breath sounds normal. No respiratory distress. Abdominal: Soft.  There is no tenderness. Negative CVA tenderness Psychiatric: Patient has a normal mood and affect. behavior is normal. Judgment and thought content normal. Muscular Skeletal: unable to flex left knee ( history of replacement ) no pain during palpation of lumbar spine  Recent Results (from the past 2160 hour(s))  Lipid panel     Status: Abnormal   Collection Time: 10/31/15  9:55 AM  Result Value Ref Range   Cholesterol 113 (L) 125 - 200 mg/dL   Triglycerides 37 <150 mg/dL   HDL 54 >=46 mg/dL   Total CHOL/HDL Ratio 2.1 <=5.0 Ratio   VLDL 7 <30 mg/dL   LDL Cholesterol 52 <130 mg/dL    Comment:   Total Cholesterol/HDL Ratio:CHD Risk                        Coronary Heart Disease Risk Table                                        Men       Women          1/2 Average Risk              3.4        3.3              Average Risk              5.0        4.4           2X Average Risk              9.6        7.1           3X Average Risk             23.4       11.0 Use the calculated Patient Ratio above and the CHD Risk table  to determine the patient's CHD Risk.   Hemoglobin A1c     Status: Abnormal   Collection Time: 10/31/15  9:55 AM  Result Value Ref Range   Hgb A1c MFr Bld 5.9 (H) <5.7 %    Comment:   For someone without known diabetes, a hemoglobin A1c value between 5.7% and 6.4% is consistent with prediabetes and should be confirmed with a follow-up test.   For someone with  known diabetes, a value <7% indicates that their diabetes is well controlled. A1c targets should be individualized based on duration of diabetes, age, co-morbid conditions and other considerations.   This assay result is consistent with an increased risk of diabetes.   Currently, no consensus exists  regarding use of hemoglobin A1c for diagnosis of diabetes in children.      Mean Plasma Glucose 123 mg/dL  COMPLETE METABOLIC PANEL WITH GFR     Status: None   Collection Time: 10/31/15  9:55 AM  Result Value Ref Range   Sodium 143 135 - 146 mmol/L   Potassium 4.4 3.5 - 5.3 mmol/L   Chloride 106 98 - 110 mmol/L   CO2 25 20 - 31 mmol/L   Glucose, Bld 80 65 - 99 mg/dL   BUN 18 7 - 25 mg/dL   Creat 0.72 0.60 - 0.93 mg/dL    Comment:   For patients > or = 78 years of age: The upper reference limit for Creatinine is approximately 13% higher for people identified as African-American.      Total Bilirubin 0.6 0.2 - 1.2 mg/dL   Alkaline Phosphatase 114 33 - 130 U/L   AST 16 10 - 35 U/L   ALT 15 6 - 29 U/L   Total Protein 6.3 6.1 - 8.1 g/dL   Albumin 4.1 3.6 - 5.1 g/dL   Calcium 9.5 8.6 - 10.4 mg/dL   GFR, Est African American >89 >=60 mL/min   GFR, Est Non African American 81 >=60 mL/min  VITAMIN D 25 Hydroxy (Vit-D Deficiency, Fractures)     Status: None   Collection Time: 10/31/15  9:55 AM  Result Value Ref Range   Vit D, 25-Hydroxy 36 30 - 100 ng/mL    Comment: Vitamin D Status           25-OH Vitamin D        Deficiency                <20 ng/mL        Insufficiency         20 - 29 ng/mL        Optimal             > or = 30 ng/mL   For 25-OH Vitamin D testing on patients on D2-supplementation and patients for whom quantitation of D2 and D3 fractions is required, the QuestAssureD 25-OH VIT D, (D2,D3), LC/MS/MS is recommended: order code [phone] (patients > 2 yrs).   POCT urinalysis dipstick     Status: None   Collection Time: 12/17/15  3:58 PM  Result Value Ref Range   Color, UA yellow    Clarity, UA clear    Glucose, UA negative    Bilirubin, UA negative    Ketones, UA negative    Spec Grav, UA 1.015    Blood, UA negative    pH, UA 5.0    Protein, UA negative    Urobilinogen, UA negative    Nitrite, UA negative    Leukocytes, UA Negative Negative       PHQ2/9: Depression screen [facility] 2/9 12/17/2015 10/28/2015 06/26/2015 05/28/2015 12/26/2014  Decreased Interest 0 0 0 0 0  Down, Depressed, Hopeless 0 0 0 0 0  PHQ - 2 Score 0 0 0 0 0     Fall Risk: Fall Risk  12/17/2015 10/28/2015 06/26/2015 05/28/2015 12/26/2014  Falls in the past year? No No Yes Yes No  Number falls in past yr: - -  1 1 -  Injury with Fall? - - Yes No -  Follow up - - - Falls evaluation completed -      Functional Status Survey: Is the patient deaf or have difficulty hearing?: No Does the patient have difficulty seeing, even when wearing glasses/contacts?: Yes (glasses) Does the patient have difficulty concentrating, remembering, or making decisions?: No Does the patient have difficulty walking or climbing stairs?: Yes Does the patient have difficulty dressing or bathing?: No Does the patient have difficulty doing errands alone such as visiting a doctor's office or shopping?: No   Assessment & Plan  1. Acute bilateral low back pain without sciatica  - POCT urinalysis dipstick - CULTURE, URINE COMPREHENSIVE  2. Urge incontinence of urine  - mirabegron ER (MYRBETRIQ) 25 MG TB24 tablet; Take 1 tablet (25 mg total) by mouth daily.  Dispense: 30 tablet; Refill: 2

## 2015-12-17 NOTE — Telephone Encounter (Signed)
Got a fax from [facility] requesting a refill of this patient's Tramadol-Acetaminophen 37.5-325mg .  Refill request was sent to Dr. Steele Sizer for approval and submission.

## 2015-12-20 LAB — CULTURE, URINE COMPREHENSIVE

## 2015-12-21 ENCOUNTER — Other Ambulatory Visit: Payer: Self-pay | Admitting: Family Medicine

## 2015-12-21 MED ORDER — NITROFURANTOIN MONOHYD MACRO 100 MG PO CAPS: 100.0000 mg | ORAL_CAPSULE | Freq: Two times a day (BID) | ORAL | 0 refills | Status: DC

## 2016-02-04 DIAGNOSIS — Z96652 Presence of left artificial knee joint: Secondary | ICD-10-CM | POA: Diagnosis not present

## 2016-02-24 ENCOUNTER — Other Ambulatory Visit: Payer: Self-pay | Admitting: Family Medicine

## 2016-02-24 DIAGNOSIS — E785 Hyperlipidemia, unspecified: Secondary | ICD-10-CM

## 2016-02-24 NOTE — Telephone Encounter (Signed)
Patient requesting refill of Atorvastatin and Losartan to [facility].

## 2016-02-27 ENCOUNTER — Ambulatory Visit: Payer: Medicare Other | Admitting: Family Medicine

## 2016-03-05 ENCOUNTER — Encounter: Payer: Self-pay | Admitting: Family Medicine

## 2016-03-05 ENCOUNTER — Ambulatory Visit (INDEPENDENT_AMBULATORY_CARE_PROVIDER_SITE_OTHER): Payer: Medicare Other | Admitting: Family Medicine

## 2016-03-05 VITALS — BP 138/64 | HR 80 | Temp 98.0°F | Resp 16 | Ht 64.0 in | Wt 137.2 lb

## 2016-03-05 DIAGNOSIS — M542 Cervicalgia: Secondary | ICD-10-CM

## 2016-03-05 DIAGNOSIS — Z9660 Presence of unspecified orthopedic joint implant: Secondary | ICD-10-CM

## 2016-03-05 DIAGNOSIS — M170 Bilateral primary osteoarthritis of knee: Secondary | ICD-10-CM

## 2016-03-05 DIAGNOSIS — I10 Essential (primary) hypertension: Secondary | ICD-10-CM | POA: Diagnosis not present

## 2016-03-05 DIAGNOSIS — K219 Gastro-esophageal reflux disease without esophagitis: Secondary | ICD-10-CM

## 2016-03-05 DIAGNOSIS — E7800 Pure hypercholesterolemia, unspecified: Secondary | ICD-10-CM | POA: Diagnosis not present

## 2016-03-05 MED ORDER — MELOXICAM 7.5 MG PO TABS: 7.5000 mg | ORAL_TABLET | Freq: Every day | ORAL | 0 refills | Status: DC

## 2016-03-05 MED ORDER — GABAPENTIN 300 MG PO CAPS: ORAL_CAPSULE | ORAL | 1 refills | Status: DC

## 2016-03-05 MED ORDER — PANTOPRAZOLE SODIUM 40 MG PO TBEC: 40.0000 mg | DELAYED_RELEASE_TABLET | Freq: Every day | ORAL | 1 refills | Status: DC

## 2016-03-05 MED ORDER — TRAMADOL-ACETAMINOPHEN 37.5-325 MG PO TABS: 1.0000 | ORAL_TABLET | Freq: Three times a day (TID) | ORAL | 0 refills | Status: DC | PRN

## 2016-03-05 NOTE — Progress Notes (Signed)
Name: Catherine Burgess   MRN: [id number]    DOB: January 14, 1936   Date:03/05/2016       Progress Note  Subjective  Chief Complaint  Chief Complaint  Patient presents with  . Medication Refill    4 month F/U  . Foot Pain    Onset-Couple of months,right big toe has been bothering her. Wanted check to see if it was her shoes or the way they are rubbing her toe.   [name] Hypertension    Wears compression stocks and helps edema, checks BP at home and running well.  . Hyperlipidemia    Denies any symptoms  . Gastroesophageal Reflux    Takes medication daily and will watch what she eats.   . Osteoarthritis    Knees have improved since injections and medication. Right knee has improved dramatically but left knee gives her a little trouble.    HPI   HTN :well controlled,  continue Losartan, off diuretic and denies orthostatic changes, I explained that for her age 25's is safer than 120's. No longer has chest pain, normal evaluation by Dr. Clayborn Bigness. No SOB or palpitation. She states her bp is usually 127/80's  Hyperlipidemia: taking Atorvastatin, reviewed labs done 10/2015 , doing well , no myalgias   OA: both knees, has daily pain, currently on Tylenol prn Ultrace and Gabapentin. Sees Dr. Marry Guan s/p total knee replacement, she has decrease rom of left knee.. Pain is described as aching, but no longer constant, no longer  wearing a soft brace.    Hyperglycemia: she denies polyphagia, polyuria, she states she has polyuria. She is still likes bread, but trying to avoid bread and desserts.   Neck pain: doing better, no radiculitis, taking gabapentin twice daily, denies mental fogginess or fatigue   GERD: no symptoms at this time, denies regurgitation or heartburn, she is trying to wean self off PPI    Patient Active Problem List   Diagnosis Date Noted  . Vitamin D deficiency 10/28/2015  . Post menopausal syndrome 10/28/2015  . OP (osteoporosis) 10/28/2015  . Cervical disc disease  10/28/2015  . H/O total knee replacement 01/20/2015  . Vaginal atrophy 09/26/2014  . Osteoarthritis of both knees 07/31/2014  . GERD without esophagitis 07/31/2014  . HLD (hyperlipidemia) 07/31/2014  . Hypertension, benign 07/31/2014  . History of artificial joint 05/18/2013    Past Surgical History:  Procedure Laterality Date  . REPLACEMENT TOTAL KNEE Left 11/12/2009  . TOTAL HIP ARTHROPLASTY Left 11/12/2009    Family History  Problem Relation Age of Onset  . CAD Mother   . Emphysema Father     Social History   Social History  . Marital status: Single    Spouse name: N/A  . Number of children: N/A  . Years of education: N/A   Occupational History  . Not on file.   Social History Main Topics  . Smoking status: Never Smoker  . Smokeless tobacco: Never Used  . Alcohol use No  . Drug use: No  . Sexual activity: Not Currently   Other Topics Concern  . Not on file   Social History Narrative  . No narrative on file     Current Outpatient Prescriptions:  .  acetaminophen (TYLENOL) 500 MG tablet, Take 1 tablet (500 mg total) by mouth 2 (two) times daily., Disp: 180 tablet, Rfl: 0 .  aspirin EC 81 MG tablet, Take 1 tablet (81 mg total) by mouth daily., Disp: 30 tablet, Rfl: 0 .  atorvastatin (LIPITOR) 40 MG  tablet, take 1 tablet by mouth once daily, Disp: 90 tablet, Rfl: 0 .  Calcium-Vitamin D 600-200 MG-UNIT tablet, Take by mouth., Disp: , Rfl:  .  gabapentin (NEURONTIN) 300 MG capsule, take 1 capsule by mouth two times a day, Disp: 180 capsule, Rfl: 1 .  losartan (COZAAR) 50 MG tablet, take 1 tablet by mouth once daily, Disp: 90 tablet, Rfl: 1 .  meloxicam (MOBIC) 7.5 MG tablet, Take 1 tablet (7.5 mg total) by mouth daily., Disp: 90 tablet, Rfl: 0 .  pantoprazole (PROTONIX) 40 MG tablet, Take 1 tablet (40 mg total) by mouth daily., Disp: 90 tablet, Rfl: 1 .  ranitidine (ZANTAC) 150 MG tablet, Take 1 tablet (150 mg total) by mouth 2 (two) times daily. To alternate  with pantoprazole until you no longer need to take it, Disp: 180 tablet, Rfl: 0 .  traMADol-acetaminophen (ULTRACET) 37.5-325 MG tablet, Take 1 tablet by mouth every 8 (eight) hours as needed., Disp: 90 tablet, Rfl: 0  Allergies  Allergen Reactions  . Lovastatin Other (See Comments)    chest  . Pravastatin     Other reaction(s): Muscle Pain chest  . Rosuvastatin     Other reaction(s): Muscle Pain chest Other reaction(s): Muscle Pain chest  . Statins     Other reaction(s): Muscle Pain chest     ROS  Constitutional: Negative for fever or significant weight change.  Respiratory: Negative for cough and shortness of breath.   Cardiovascular: Negative for chest pain or palpitations.  Gastrointestinal: Negative for abdominal pain, no bowel changes.  Musculoskeletal: Positive  for gait problem and occasional  joint swelling.  Skin: Negative for rash.  Neurological: Negative for dizziness or headache.  No other specific complaints in a complete review of systems (except as listed in HPI above).  Objective  Vitals:   03/05/16 1137  BP: 138/64  Pulse: 80  Resp: 16  Temp: 98 F (36.7 C)  TempSrc: Oral  SpO2: 96%  Weight: 137 lb 3.2 oz (62.2 kg)  Height: 5\' 4"  (1.626 m)    Body mass index is 23.55 kg/m.  Physical Exam  Constitutional: Patient appears well-developed and well-nourished. No distress.  HEENT: head atraumatic, normocephalic, pupils equal and reactive to light,  neck supple, throat within normal limits Cardiovascular: Normal rate, regular rhythm and normal heart sounds.  No murmur heard. No BLE edema. Pulmonary/Chest: Effort normal and breath sounds normal. No respiratory distress. Abdominal: Soft.  There is no tenderness. Psychiatric: Patient has a normal mood and affect. behavior is normal. Judgment and thought content normal. Muscular Skeletal: decrease rom of left knee, normal ROM and no pain during exam of right knee, some deformities from OA on both  hands  Recent Results (from the past 2160 hour(s))  POCT urinalysis dipstick     Status: None   Collection Time: 12/17/15  3:58 PM  Result Value Ref Range   Color, UA yellow    Clarity, UA clear    Glucose, UA negative    Bilirubin, UA negative    Ketones, UA negative    Spec Grav, UA 1.015    Blood, UA negative    pH, UA 5.0    Protein, UA negative    Urobilinogen, UA negative    Nitrite, UA negative    Leukocytes, UA Negative Negative  CULTURE, URINE COMPREHENSIVE     Status: None   Collection Time: 12/17/15  4:47 PM  Result Value Ref Range   Culture ESCHERICHIA COLI    Colony Count  1,000-10,000 CFU/mL    Organism ID, Bacteria ESCHERICHIA COLI       Susceptibility   Escherichia coli -  (no method available)    AMPICILLIN >=32 Resistant     AMOX/CLAVULANIC 16 Intermediate     AMPICILLIN/SULBACTAM >=32 Resistant     PIP/TAZO 16 Sensitive     IMIPENEM <=0.25 Sensitive     CEFAZOLIN 8 Resistant     CEFTRIAXONE <=1 Sensitive     CEFTAZIDIME <=1 Sensitive     CEFEPIME <=1 Sensitive     GENTAMICIN <=1 Sensitive     TOBRAMYCIN <=1 Sensitive     CIPROFLOXACIN >=4 Resistant     LEVOFLOXACIN >=8 Resistant     NITROFURANTOIN <=16 Sensitive     TRIMETH/SULFA* >=320 Resistant      * NR=NOT REPORTABLE,SEE COMMENTORAL therapy:A cefazolin MIC of <32 predicts susceptibility to the oral agents cefaclor,cefdinir,cefpodoxime,cefprozil,cefuroxime,cephalexin,and loracarbef when used for therapy of uncomplicated UTIs due to E.coli,K.pneumomiae,and P.mirabilis. PARENTERAL therapy: A cefazolinMIC of >8 indicates resistance to parenteralcefazolin. An alternate test method must beperformed to confirm susceptibility to parenteralcefazolin.     PHQ2/9: Depression screen [facility] 2/9 03/05/2016 12/17/2015 10/28/2015 06/26/2015 05/28/2015  Decreased Interest 0 0 0 0 0  Down, Depressed, Hopeless 0 0 0 0 0  PHQ - 2 Score 0 0 0 0 0     Fall Risk: Fall Risk  03/05/2016 12/17/2015 10/28/2015 06/26/2015  05/28/2015  Falls in the past year? No No No Yes Yes  Number falls in past yr: - - - 1 1  Injury with Fall? - - - Yes No  Follow up - - - - Falls evaluation completed     Functional Status Survey: Is the patient deaf or have difficulty hearing?: No Does the patient have difficulty seeing, even when wearing glasses/contacts?: No Does the patient have difficulty concentrating, remembering, or making decisions?: No Does the patient have difficulty walking or climbing stairs?: No Does the patient have difficulty dressing or bathing?: No Does the patient have difficulty doing errands alone such as visiting a doctor's office or shopping?: No   Assessment & Plan  1. Essential hypertension  Continue Losartan, bp is at goal   2. Pure hypercholesterolemia  On Atorvastatin, no side effects  3. Osteoarthritis of both knees, unspecified osteoarthritis type  She is not sure if she is taking Ultracet, advised to take it prn, and we will try to decrease dose of Meloxicam and take it prn also  - traMADol-acetaminophen (ULTRACET) 37.5-325 MG tablet; Take 1 tablet by mouth every 8 (eight) hours as needed.  Dispense: 90 tablet; Refill: 0 - meloxicam (MOBIC) 7.5 MG tablet; Take 1 tablet (7.5 mg total) by mouth daily.  Dispense: 90 tablet; Refill: 0 - gabapentin (NEURONTIN) 300 MG capsule; take 1 capsule by mouth two times a day  Dispense: 180 capsule; Refill: 1  4. History of artificial joint  Still has decrease rom of left knee  5. GERD without esophagitis  Continue trying to wean off PPI  - pantoprazole (PROTONIX) 40 MG tablet; Take 1 tablet (40 mg total) by mouth daily.  Dispense: 90 tablet; Refill: 1  6. Neck pain  Doing better on Gabapenin - gabapentin (NEURONTIN) 300 MG capsule; take 1 capsule by mouth two times a day  Dispense: 180 capsule; Refill: 1

## 2016-03-05 NOTE — Patient Instructions (Signed)
Try to stop taking Pantoprazole by increasing the frequency that you will take Ranitidine, maybe take Pantoprazole very 3 days, and Ranitidine the days that you are not taking Pantoprazole.

## 2016-03-24 DIAGNOSIS — J069 Acute upper respiratory infection, unspecified: Secondary | ICD-10-CM | POA: Diagnosis not present

## 2016-03-30 NOTE — Progress Notes (Signed)
ANNUAL PREVENTATIVE CARE GYNECOLOGY  ENCOUNTER NOTE  Subjective:       Catherine Burgess is a 81 y.o. G62P1001 female here for a routine annual gynecologic exam. The patient is not sexually active. The patient is not currently taking hormone replacement therapy. Patient denies post-menopausal vaginal bleeding. The patient wears seatbelts: yes. The patient participates in regular exercise: yes. Has the patient ever been transfused or tattooed?: no. The patient reports that there is not domestic violence in her life.  Current complaints: 1.  Notes having flu last week. Has been feeling a little weak and tired still, but slowly improving.    Gynecologic History No LMP recorded. Patient is postmenopausal. Last Pap: ~ 11 years ago. Results were: normal Last mammogram: ~ 4 years ago. Results were: normal Last Colonoscopy: ~ 4 years ago. Results were normal.  Last Dexa Scan: ~ 3 years ago.  Results were abnormal: Osteopenia (T score of -1.7).    Obstetric History OB History  Gravida Para Term Preterm AB Living  1 1 1         SAB TAB Ectopic Multiple Live Births          1    # Outcome Date GA Lbr Len/2nd Weight Sex Delivery Anes PTL Lv  1 Term  [redacted]w[redacted]d    Vag-Spont         Past Medical History:  Diagnosis Date  . Degenerative joint disease   . Hyperlipidemia   . Hypertension   . Osteoarthrosis   . Reflux esophagitis     Family History  Problem Relation Age of Onset  . CAD Mother   . Emphysema Father     Past Surgical History:  Procedure Laterality Date  . REPLACEMENT TOTAL KNEE Left 11/12/2009  . TOTAL HIP ARTHROPLASTY Left 11/12/2009    Social History   Social History  . Marital status: Single    Spouse name: N/A  . Number of children: N/A  . Years of education: N/A   Occupational History  . Not on file.   Social History Main Topics  . Smoking status: Never Smoker  . Smokeless tobacco: Never Used  . Alcohol use No  . Drug use: No  . Sexual activity: Not  Currently   Other Topics Concern  . Not on file   Social History Narrative  . No narrative on file    Current Outpatient Prescriptions on File Prior to Visit  Medication Sig Dispense Refill  . acetaminophen (TYLENOL) 500 MG tablet Take 1 tablet (500 mg total) by mouth 2 (two) times daily. 180 tablet 0  . aspirin EC 81 MG tablet Take 1 tablet (81 mg total) by mouth daily. 30 tablet 0  . atorvastatin (LIPITOR) 40 MG tablet take 1 tablet by mouth once daily 90 tablet 0  . Calcium-Vitamin D 600-200 MG-UNIT tablet Take by mouth.    . gabapentin (NEURONTIN) 300 MG capsule take 1 capsule by mouth two times a day 180 capsule 1  . losartan (COZAAR) 50 MG tablet take 1 tablet by mouth once daily 90 tablet 1  . meloxicam (MOBIC) 7.5 MG tablet Take 1 tablet (7.5 mg total) by mouth daily. 90 tablet 0  . pantoprazole (PROTONIX) 40 MG tablet Take 1 tablet (40 mg total) by mouth daily. 90 tablet 1  . ranitidine (ZANTAC) 150 MG tablet Take 1 tablet (150 mg total) by mouth 2 (two) times daily. To alternate with pantoprazole until you no longer need to take it 180 tablet 0  .  traMADol-acetaminophen (ULTRACET) 37.5-325 MG tablet Take 1 tablet by mouth every 8 (eight) hours as needed. 90 tablet 0   No current facility-administered medications on file prior to visit.     Allergies  Allergen Reactions  . Lovastatin Other (See Comments)    chest  . Pravastatin     Other reaction(s): Muscle Pain chest  . Rosuvastatin     Other reaction(s): Muscle Pain chest Other reaction(s): Muscle Pain chest  . Statins     Other reaction(s): Muscle Pain chest      Review of Systems ROS Review of Systems - General ROS: negative for - chills, fatigue, fever, hot flashes, night sweats, weight gain or weight loss Psychological ROS: negative for - anxiety, decreased libido, depression, mood swings, physical abuse or sexual abuse Ophthalmic ROS: negative for - blurry vision, eye pain or loss of vision ENT ROS:  negative for - headaches, hearing change, visual changes or vocal changes Allergy and Immunology ROS: negative for - hives, itchy/watery eyes or seasonal allergies Hematological and Lymphatic ROS: negative for - bleeding problems, bruising, swollen lymph nodes or weight loss Endocrine ROS: negative for - galactorrhea, hair pattern changes, hot flashes, malaise/lethargy, mood swings, palpitations, polydipsia/polyuria, skin changes, temperature intolerance or unexpected weight changes Breast ROS: negative for - new or changing breast lumps or nipple discharge Respiratory ROS: negative for - cough or shortness of breath Cardiovascular ROS: negative for - chest pain, irregular heartbeat, palpitations or shortness of breath Gastrointestinal ROS: no abdominal pain, change in bowel habits, or black or bloody stools Genito-Urinary ROS: no dysuria, trouble voiding, or hematuria Musculoskeletal ROS: negative for - joint pain or joint stiffness Neurological ROS: negative for - bowel and bladder control changes Dermatological ROS: negative for rash and skin lesion changes   Objective:   BP (!) 180/63 (BP Location: Left Arm, Patient Position: Sitting, Cuff Size: Normal)   Pulse 75   Wt 141 lb (64 kg)   BMI 24.20 kg/m  CONSTITUTIONAL: Well-developed, well-nourished female in no acute distress.  PSYCHIATRIC: Normal mood and affect. Normal behavior. Normal judgment and thought content. [place]: Alert and oriented to person, place, and time. Normal muscle tone coordination. No cranial nerve deficit noted. HENT:  Normocephalic, atraumatic, External right and left ear normal. Oropharynx is clear and moist EYES: Conjunctivae and EOM are normal. Pupils are equal, round, and reactive to light. No scleral icterus.  NECK: Normal range of motion, supple, no masses.  Normal thyroid.  SKIN: Skin is warm and dry. No rash noted. Not diaphoretic. No erythema. No pallor. CARDIOVASCULAR: Normal heart rate noted,  regular rhythm, no murmur. RESPIRATORY: Clear to auscultation bilaterally. Effort and breath sounds normal, no problems with respiration noted. BREASTS: Symmetric in size. No masses, skin changes, nipple drainage, or lymphadenopathy. ABDOMEN: Soft, normal bowel sounds, no distention noted.  No tenderness, rebound or guarding.  BLADDER: Normal PELVIC:  Bladder no bladder distension noted  Urethra: normal appearing urethra with no masses, tenderness or lesions  Vulva: normal appearing vulva with no masses, tenderness or lesions  Vagina: normal appearing vagina with normal color and discharge, no lesions and atrophic  Cervix: normal appearing cervix without discharge or lesions  Uterus: uterus is normal size, shape, consistency and nontender  Adnexa: normal adnexa in size, nontender and no masses  RV: External Exam NormaI, No Rectal Masses and Normal Sphincter tone  MUSCULOSKELETAL: Normal range of motion. No tenderness.  No cyanosis, clubbing, or edema.  2+ distal pulses. LYMPHATIC: No Axillary, Supraclavicular, or Inguinal Adenopathy.  Labs:  Lab Results  Component Value Date   WBC 7.6 05/28/2015   HGB 13.3 01/22/2014   HCT 40.1 05/28/2015   MCV 85 05/28/2015   PLT 358 05/28/2015     Chemistry      Component Value Date/Time   NA 143 10/31/2015 0955   K 4.4 10/31/2015 0955   CL 106 10/31/2015 0955   CO2 25 10/31/2015 0955   BUN 18 10/31/2015 0955   CREATININE 0.72 10/31/2015 0955      Component Value Date/Time   CALCIUM 9.5 10/31/2015 0955   ALKPHOS 114 10/31/2015 0955   AST 16 10/31/2015 0955   ALT 15 10/31/2015 0955   BILITOT 0.6 10/31/2015 0955      Lab Results  Component Value Date   TSH 1.390 01/15/2015   Lab Results  Component Value Date   CHOL 113 (L) 10/31/2015   HDL 54 10/31/2015   LDLCALC 52 10/31/2015   TRIG 37 10/31/2015   CHOLHDL 2.1 10/31/2015     Assessment:   Annual gynecologic examination  Normal BMI H/o osteopenia Vaginal  atrophy  Plan:  Pap: Not needed Mammogram: Not Indicated Stool Guaiac Testing:  Not Indicated Labs: Patient will have labs performed by PCP in September.  Routine preventative health maintenance measures emphasized: Exercise/Diet/Weight control, Alcohol/Substance use risks and Stress Management Continue to encourage Calcium and Vitamin D supplementation.  May be time for new DEXA scan.  Patient will inquire with PCP.  Patient currently using estrogen vaginal cream as needed for vaginal atrophy. Denies symptoms.  Return to [place], MD [facility]

## 2016-03-31 ENCOUNTER — Encounter: Payer: Self-pay | Admitting: Obstetrics and Gynecology

## 2016-03-31 ENCOUNTER — Ambulatory Visit (INDEPENDENT_AMBULATORY_CARE_PROVIDER_SITE_OTHER): Payer: Medicare Other | Admitting: Obstetrics and Gynecology

## 2016-03-31 VITALS — BP 180/63 | HR 75 | Wt 141.0 lb

## 2016-03-31 DIAGNOSIS — Z01419 Encounter for gynecological examination (general) (routine) without abnormal findings: Secondary | ICD-10-CM | POA: Diagnosis not present

## 2016-03-31 DIAGNOSIS — M85861 Other specified disorders of bone density and structure, right lower leg: Secondary | ICD-10-CM

## 2016-03-31 DIAGNOSIS — N952 Postmenopausal atrophic vaginitis: Secondary | ICD-10-CM

## 2016-03-31 NOTE — Patient Instructions (Signed)

## 2016-04-05 ENCOUNTER — Encounter: Payer: Self-pay | Admitting: Obstetrics and Gynecology

## 2016-05-11 ENCOUNTER — Other Ambulatory Visit: Payer: Self-pay | Admitting: Family Medicine

## 2016-05-11 DIAGNOSIS — K219 Gastro-esophageal reflux disease without esophagitis: Secondary | ICD-10-CM

## 2016-05-11 NOTE — Telephone Encounter (Signed)
Please advise, patient was last given Ranitidine 6 months ago and was instructed to take 1 tablet bid with alternating it with Pantoprazole until she no longer needed it. Now getting a refill request. Thanks

## 2016-05-24 ENCOUNTER — Other Ambulatory Visit: Payer: Self-pay | Admitting: Family Medicine

## 2016-05-24 DIAGNOSIS — E785 Hyperlipidemia, unspecified: Secondary | ICD-10-CM

## 2016-06-05 DIAGNOSIS — H43813 Vitreous degeneration, bilateral: Secondary | ICD-10-CM | POA: Diagnosis not present

## 2016-06-09 ENCOUNTER — Other Ambulatory Visit: Payer: Self-pay | Admitting: Family Medicine

## 2016-06-09 NOTE — Telephone Encounter (Signed)
Patient requesting refill of Losartan to [facility].

## 2016-07-01 ENCOUNTER — Encounter: Payer: Self-pay | Admitting: Family Medicine

## 2016-07-01 ENCOUNTER — Ambulatory Visit (INDEPENDENT_AMBULATORY_CARE_PROVIDER_SITE_OTHER): Payer: Medicare Other | Admitting: Family Medicine

## 2016-07-01 VITALS — BP 144/76 | HR 76 | Temp 98.0°F | Resp 16 | Ht 64.0 in | Wt 144.2 lb

## 2016-07-01 DIAGNOSIS — R739 Hyperglycemia, unspecified: Secondary | ICD-10-CM

## 2016-07-01 DIAGNOSIS — I10 Essential (primary) hypertension: Secondary | ICD-10-CM | POA: Diagnosis not present

## 2016-07-01 DIAGNOSIS — Z9660 Presence of unspecified orthopedic joint implant: Secondary | ICD-10-CM | POA: Diagnosis not present

## 2016-07-01 DIAGNOSIS — N3941 Urge incontinence: Secondary | ICD-10-CM | POA: Diagnosis not present

## 2016-07-01 DIAGNOSIS — M542 Cervicalgia: Secondary | ICD-10-CM

## 2016-07-01 DIAGNOSIS — M170 Bilateral primary osteoarthritis of knee: Secondary | ICD-10-CM

## 2016-07-01 DIAGNOSIS — E7800 Pure hypercholesterolemia, unspecified: Secondary | ICD-10-CM | POA: Diagnosis not present

## 2016-07-01 DIAGNOSIS — K219 Gastro-esophageal reflux disease without esophagitis: Secondary | ICD-10-CM | POA: Diagnosis not present

## 2016-07-01 MED ORDER — PANTOPRAZOLE SODIUM 40 MG PO TBEC: 40.0000 mg | DELAYED_RELEASE_TABLET | Freq: Every day | ORAL | 1 refills | Status: DC

## 2016-07-01 MED ORDER — GABAPENTIN 300 MG PO CAPS
ORAL_CAPSULE | ORAL | 1 refills | Status: DC
Start: 2016-07-01 — End: 2017-03-01

## 2016-07-01 NOTE — Progress Notes (Signed)
Name: Catherine Burgess   MRN: [id number]    DOB: [birth year]   Date:07/01/2016       Progress Note  Subjective  Chief Complaint  Chief Complaint  Patient presents with  . Medication Refill  . Hypertension    Occasional headaches  . Gastroesophageal Reflux  . Hyperlipidemia  . Osteoarthritis    Still bothers her having injections in her right knee now. Still takes medication as needed    HPI  HTN : BP is elevated, it was also high during Dr. Andreas Blower visit. She states she has been eating more salt lately.  Continue Losartan, off diuretic and denies orthostatic changes, I explained that for her age 79's is safer than 120's. No longer has chest pain, normal evaluation by Dr. Clayborn Bigness. No SOB or palpitation. She states her bp is usually 130's/80's. Wondering if bp could be high secondary to increase in left knee pain.   Hyperlipidemia: taking Atorvastatin, reviewed labs done 10/2015 , doing well , no myalgias   OA: both knees, has daily pain, currently on Tylenol prn  and Gabapentin. Sees Dr. Jovita Kussmaul  s/p total knee replacement, she has decrease rom of left knee. Pain is described as aching, and is getting worse, no longer  wearing a soft brace.   Hyperglycemia: she denies polyphagia, polyuria, she states she has polyuria. She is still likes bread, but trying to avoid bread and desserts.   Urinary urgency: she had similar symptoms last Nov and had an UTI, she denies dysuria or hematuria  Neck pain: doing better, no radiculitis, taking gabapentin twice daily, denies mental fogginess or fatigue   GERD: no symptoms at this time, denies regurgitation or heartburn, she is trying to wean self off PPI .    Patient Active Problem List   Diagnosis Date Noted  . Vitamin D deficiency 10/28/2015  . Post menopausal syndrome 10/28/2015  . OP (osteoporosis) 10/28/2015  . Cervical disc disease 10/28/2015  . H/O total knee replacement 01/20/2015  . Vaginal atrophy 09/26/2014  .  Osteoarthritis of both knees 07/31/2014  . GERD without esophagitis 07/31/2014  . HLD (hyperlipidemia) 07/31/2014  . Hypertension, benign 07/31/2014  . History of artificial joint 05/18/2013    Past Surgical History:  Procedure Laterality Date  . REPLACEMENT TOTAL KNEE Left 11/12/2009  . TOTAL HIP ARTHROPLASTY Left 11/12/2009    Family History  Problem Relation Age of Onset  . Emphysema Father   . CAD Mother     Social History   Social History  . Marital status: Single    Spouse name: N/A  . Number of children: N/A  . Years of education: N/A   Occupational History  . Not on file.   Social History Main Topics  . Smoking status: Never Smoker  . Smokeless tobacco: Never Used  . Alcohol use No  . Drug use: No  . Sexual activity: Not Currently   Other Topics Concern  . Not on file   Social History Narrative  . No narrative on file     Current Outpatient Prescriptions:  .  acetaminophen (TYLENOL) 500 MG tablet, Take 1 tablet (500 mg total) by mouth 2 (two) times daily., Disp: 180 tablet, Rfl: 0 .  aspirin EC 81 MG tablet, Take 1 tablet (81 mg total) by mouth daily., Disp: 30 tablet, Rfl: 0 .  atorvastatin (LIPITOR) 40 MG tablet, take 1 tablet by mouth once daily, Disp: 90 tablet, Rfl: 0 .  Calcium-Vitamin D 600-200 MG-UNIT tablet, Take by mouth., Disp: ,  Rfl:  .  gabapentin (NEURONTIN) 300 MG capsule, take 1 capsule by mouth two times a day, Disp: 180 capsule, Rfl: 1 .  losartan (COZAAR) 50 MG tablet, take 1 tablet by mouth once daily, Disp: 90 tablet, Rfl: 1 .  meloxicam (MOBIC) 7.5 MG tablet, Take 1 tablet (7.5 mg total) by mouth daily. (Patient taking differently: Take 7.5 mg by mouth as needed. ), Disp: 90 tablet, Rfl: 0 .  pantoprazole (PROTONIX) 40 MG tablet, Take 1 tablet (40 mg total) by mouth daily., Disp: 90 tablet, Rfl: 1 .  traMADol-acetaminophen (ULTRACET) 37.5-325 MG tablet, Take 1 tablet by mouth every 8 (eight) hours as needed., Disp: 90 tablet, Rfl:  0  Allergies  Allergen Reactions  . Lovastatin Other (See Comments)    chest  . Pravastatin     Other reaction(s): Muscle Pain chest  . Rosuvastatin     Other reaction(s): Muscle Pain chest Other reaction(s): Muscle Pain chest  . Statins     Other reaction(s): Muscle Pain chest     ROS  Constitutional: Negative for fever or weight change.  Respiratory: Negative for cough and shortness of breath.   Cardiovascular: Negative for chest pain or palpitations.  Gastrointestinal: Negative for abdominal pain, no bowel changes.  Musculoskeletal: Positive  for gait problem positive for  joint swelling.  Skin: Negative for rash.  Neurological: Negative for dizziness, positive for occasional headache.  No other specific complaints in a complete review of systems (except as listed in HPI above).  Objective  Vitals:   07/01/16 1214 07/01/16 1238  BP: (!) 158/82 (!) 144/76  Pulse: 76   Resp: 16   Temp: 98 F (36.7 C)   TempSrc: Oral   SpO2: 98%   Weight: 144 lb 3.2 oz (65.4 kg)   Height: 5\' 4"  (1.626 m)     Body mass index is 24.75 kg/m.  Physical Exam  Constitutional: Patient appears well-developed and well-nourished. No distress.  HEENT: head atraumatic, normocephalic, pupils equal and reactive to light,  neck supple, throat within normal limits Cardiovascular: Normal rate, regular rhythm and normal heart sounds.  No murmur heard. No BLE edema. Pulmonary/Chest: Effort normal and breath sounds normal. No respiratory distress. Abdominal: Soft.  There is no tenderness. Psychiatric: Patient has a normal mood and affect. behavior is normal. Judgment and thought content normal. Muscular Skeletal: decrease rom of left knee, pain with rom of left knee, antalgic gait,, normal ROM and no pain during exam of right knee, some deformities from OA on both hands  PHQ2/9: Depression screen [facility] 2/9 07/01/2016 03/05/2016 12/17/2015 10/28/2015 06/26/2015  Decreased Interest 0 0 0 0 0  Down,  Depressed, Hopeless 0 0 0 0 0  PHQ - 2 Score 0 0 0 0 0     Fall Risk: Fall Risk  07/01/2016 03/05/2016 12/17/2015 10/28/2015 06/26/2015  Falls in the past year? No No No No Yes  Number falls in past yr: - - - - 1  Injury with Fall? - - - - Yes  Follow up - - - - -     Functional Status Survey: Is the patient deaf or have difficulty hearing?: No Does the patient have difficulty seeing, even when wearing glasses/contacts?: No Does the patient have difficulty concentrating, remembering, or making decisions?: No Does the patient have difficulty walking or climbing stairs?: No Does the patient have difficulty dressing or bathing?: No Does the patient have difficulty doing errands alone such as visiting a doctor's office or shopping?: No  Assessment & Plan  1. Essential hypertension   we will monitor for now, it may be secondary to pain  2. Pure hypercholesterolemia  Continue medication   3. Osteoarthritis of both knees, unspecified osteoarthritis type  - gabapentin (NEURONTIN) 300 MG capsule; take 1 capsule by mouth two times a day  Dispense: 180 capsule; Refill: 1 -referral Ortho  4. History of artificial joint   5. GERD without esophagitis  - pantoprazole (PROTONIX) 40 MG tablet; Take 1 tablet (40 mg total) by mouth daily.  Dispense: 90 tablet; Refill: 1  6. Hyperglycemia  Recheck labs next visit   7. Urge incontinence of urine  - Urine culture  8. Neck pain  - gabapentin (NEURONTIN) 300 MG capsule; take 1 capsule by mouth two times a day  Dispense: 180 capsule; Refill: 1

## 2016-07-02 NOTE — Addendum Note (Signed)
Addended by: Lolita Rieger D on: 07/02/2016 10:26 AM   Modules accepted: Orders

## 2016-07-02 NOTE — Addendum Note (Signed)
Addended by: Lolita Rieger D on: 07/02/2016 10:31 AM   Modules accepted: Orders

## 2016-07-05 ENCOUNTER — Other Ambulatory Visit: Payer: Self-pay | Admitting: Family Medicine

## 2016-07-05 LAB — URINE CULTURE

## 2016-07-05 MED ORDER — DOXYCYCLINE HYCLATE 100 MG PO TABS: 100.0000 mg | ORAL_TABLET | Freq: Two times a day (BID) | ORAL | 0 refills | Status: DC

## 2016-07-05 NOTE — Progress Notes (Signed)
Rx sent in for bladder infection; confirmed allergies and pharmacy with patient; no fever, no serious symptoms

## 2016-07-23 ENCOUNTER — Emergency Department: Payer: No Typology Code available for payment source

## 2016-07-23 ENCOUNTER — Encounter: Payer: Self-pay | Admitting: Emergency Medicine

## 2016-07-23 ENCOUNTER — Observation Stay
Admission: EM | Admit: 2016-07-23 | Discharge: 2016-07-25 | Disposition: A | Discharge status: Home / Self Care | Payer: No Typology Code available for payment source | Attending: Internal Medicine | Admitting: Internal Medicine

## 2016-07-23 DIAGNOSIS — Z888 Allergy status to other drugs, medicaments and biological substances status: Secondary | ICD-10-CM | POA: Insufficient documentation

## 2016-07-23 DIAGNOSIS — E785 Hyperlipidemia, unspecified: Secondary | ICD-10-CM | POA: Insufficient documentation

## 2016-07-23 DIAGNOSIS — K210 Gastro-esophageal reflux disease with esophagitis: Secondary | ICD-10-CM | POA: Diagnosis not present

## 2016-07-23 DIAGNOSIS — Z96642 Presence of left artificial hip joint: Secondary | ICD-10-CM | POA: Insufficient documentation

## 2016-07-23 DIAGNOSIS — Z79899 Other long term (current) drug therapy: Secondary | ICD-10-CM | POA: Diagnosis not present

## 2016-07-23 DIAGNOSIS — I639 Cerebral infarction, unspecified: Secondary | ICD-10-CM

## 2016-07-23 DIAGNOSIS — E876 Hypokalemia: Secondary | ICD-10-CM | POA: Insufficient documentation

## 2016-07-23 DIAGNOSIS — R9089 Other abnormal findings on diagnostic imaging of central nervous system: Secondary | ICD-10-CM | POA: Insufficient documentation

## 2016-07-23 DIAGNOSIS — R079 Chest pain, unspecified: Secondary | ICD-10-CM | POA: Diagnosis not present

## 2016-07-23 DIAGNOSIS — I10 Essential (primary) hypertension: Secondary | ICD-10-CM | POA: Diagnosis not present

## 2016-07-23 DIAGNOSIS — Z7982 Long term (current) use of aspirin: Secondary | ICD-10-CM | POA: Diagnosis not present

## 2016-07-23 DIAGNOSIS — Z96652 Presence of left artificial knee joint: Secondary | ICD-10-CM | POA: Diagnosis not present

## 2016-07-23 DIAGNOSIS — J449 Chronic obstructive pulmonary disease, unspecified: Secondary | ICD-10-CM | POA: Diagnosis not present

## 2016-07-23 DIAGNOSIS — R55 Syncope and collapse: Principal | ICD-10-CM | POA: Insufficient documentation

## 2016-07-23 HISTORY — DX: Essential (primary) hypertension: I10

## 2016-07-23 LAB — CBC
HEMATOCRIT: 36.4 % (ref 35.0–47.0)
HEMOGLOBIN: 11.7 g/dL — AB (ref 12.0–16.0)
MCH: 25.5 pg — AB (ref 26.0–34.0)
MCHC: 32.2 g/dL (ref 32.0–36.0)
MCV: 79.1 fL — AB (ref 80.0–100.0)
PLATELETS: 342 10*3/uL (ref 150–440)
RBC: 4.6 MIL/uL (ref 3.80–5.20)
RDW: 17.1 % — ABNORMAL HIGH (ref 11.5–14.5)
WBC: 7.5 10*3/uL (ref 3.6–11.0)

## 2016-07-23 LAB — BASIC METABOLIC PANEL
ANION GAP: 9 (ref 5–15)
BUN: 22 mg/dL — ABNORMAL HIGH (ref 6–20)
CO2: 25 mmol/L (ref 22–32)
CREATININE: 0.6 mg/dL (ref 0.44–1.00)
Calcium: 9 mg/dL (ref 8.9–10.3)
Chloride: 108 mmol/L (ref 101–111)
GFR calc non Af Amer: >60 mL/min (ref >60)
Glucose, Bld: 142 mg/dL — ABNORMAL HIGH (ref 65–99)
POTASSIUM: 3.2 mmol/L — AB (ref 3.5–5.1)
SODIUM: 142 mmol/L (ref 135–145)

## 2016-07-23 LAB — TROPONIN I
Troponin I: <0.03 ng/mL (ref <0.03)
Troponin I: <0.03 ng/mL (ref <0.03)

## 2016-07-23 LAB — HEPATIC FUNCTION PANEL
ALK PHOS: 126 U/L (ref 38–126)
ALT: 21 U/L (ref 14–54)
AST: 26 U/L (ref 15–41)
Albumin: 3.6 g/dL (ref 3.5–5.0)
BILIRUBIN TOTAL: 0.4 mg/dL (ref 0.3–1.2)
Bilirubin, Direct: <0.1 mg/dL — ABNORMAL LOW (ref 0.1–0.5)
TOTAL PROTEIN: 6.6 g/dL (ref 6.5–8.1)

## 2016-07-23 LAB — URINALYSIS, COMPLETE (UACMP) WITH MICROSCOPIC
BILIRUBIN URINE: NEGATIVE
Glucose, UA: NEGATIVE mg/dL
Hgb urine dipstick: NEGATIVE
KETONES UR: NEGATIVE mg/dL
LEUKOCYTES UA: NEGATIVE
Nitrite: NEGATIVE
PROTEIN: NEGATIVE mg/dL
Specific Gravity, Urine: 1.009 (ref 1.005–1.030)
WBC UA: NONE SEEN WBC/hpf (ref 0–5)
pH: 7 (ref 5.0–8.0)

## 2016-07-23 LAB — MAGNESIUM: Magnesium: 1.9 mg/dL (ref 1.7–2.4)

## 2016-07-23 LAB — TSH: TSH: 1.63 u[IU]/mL (ref 0.350–4.500)

## 2016-07-23 MED ORDER — SODIUM CHLORIDE 0.9% FLUSH
3.0000 mL | Freq: Two times a day (BID) | INTRAVENOUS | Status: DC
  Administered 2016-07-23 – 2016-07-25 (×3): 3 mL via INTRAVENOUS

## 2016-07-23 MED ORDER — TIOTROPIUM BROMIDE MONOHYDRATE 18 MCG IN CAPS
18.0000 ug | ORAL_CAPSULE | Freq: Every day | RESPIRATORY_TRACT | Status: DC
  Administered 2016-07-25: 18 ug via RESPIRATORY_TRACT
  Filled 2016-07-23: qty 5

## 2016-07-23 MED ORDER — ASPIRIN EC 81 MG PO TBEC
81.0000 mg | DELAYED_RELEASE_TABLET | Freq: Every day | ORAL | Status: DC
  Administered 2016-07-24 – 2016-07-25 (×2): 81 mg via ORAL
  Filled 2016-07-23 (×3): qty 1

## 2016-07-23 MED ORDER — PANTOPRAZOLE SODIUM 40 MG PO TBEC
40.0000 mg | DELAYED_RELEASE_TABLET | Freq: Every day | ORAL | Status: DC
  Administered 2016-07-24 – 2016-07-25 (×2): 40 mg via ORAL
  Filled 2016-07-23 (×2): qty 1

## 2016-07-23 MED ORDER — GABAPENTIN 300 MG PO CAPS
300.0000 mg | ORAL_CAPSULE | Freq: Two times a day (BID) | ORAL | Status: DC
  Administered 2016-07-23 – 2016-07-25 (×4): 300 mg via ORAL
  Filled 2016-07-23 (×4): qty 1

## 2016-07-23 MED ORDER — ONDANSETRON HCL 4 MG/2ML IJ SOLN: 4.0000 mg | Freq: Four times a day (QID) | INTRAMUSCULAR | Status: DC | PRN

## 2016-07-23 MED ORDER — ENOXAPARIN SODIUM 40 MG/0.4ML ~~LOC~~ SOLN
40.0000 mg | SUBCUTANEOUS | Status: DC
  Administered 2016-07-23 – 2016-07-24 (×2): 40 mg via SUBCUTANEOUS
  Filled 2016-07-23 (×2): qty 0.4

## 2016-07-23 MED ORDER — POTASSIUM CHLORIDE CRYS ER 20 MEQ PO TBCR
40.0000 meq | EXTENDED_RELEASE_TABLET | Freq: Once | ORAL | Status: AC
  Administered 2016-07-23: 40 meq via ORAL
  Filled 2016-07-23: qty 2

## 2016-07-23 MED ORDER — ACETAMINOPHEN 325 MG PO TABS
650.0000 mg | ORAL_TABLET | Freq: Four times a day (QID) | ORAL | Status: DC | PRN
  Administered 2016-07-23: 650 mg via ORAL
  Filled 2016-07-23: qty 2

## 2016-07-23 MED ORDER — METOPROLOL TARTRATE 5 MG/5ML IV SOLN
10.0000 mg | Freq: Once | INTRAVENOUS | Status: DC
  Filled 2016-07-23: qty 10

## 2016-07-23 MED ORDER — SODIUM CHLORIDE 0.9% FLUSH
3.0000 mL | Freq: Two times a day (BID) | INTRAVENOUS | Status: DC
  Administered 2016-07-23 – 2016-07-25 (×4): 3 mL via INTRAVENOUS

## 2016-07-23 MED ORDER — DOXYCYCLINE HYCLATE 100 MG PO TABS
100.0000 mg | ORAL_TABLET | Freq: Two times a day (BID) | ORAL | Status: DC
  Administered 2016-07-23 – 2016-07-24 (×2): 100 mg via ORAL
  Filled 2016-07-23 (×2): qty 1

## 2016-07-23 MED ORDER — LOSARTAN POTASSIUM 50 MG PO TABS
50.0000 mg | ORAL_TABLET | Freq: Once | ORAL | Status: AC
  Administered 2016-07-23: 50 mg via ORAL
  Filled 2016-07-23: qty 1

## 2016-07-23 MED ORDER — ACETAMINOPHEN 650 MG RE SUPP: 650.0000 mg | Freq: Four times a day (QID) | RECTAL | Status: DC | PRN

## 2016-07-23 MED ORDER — SODIUM CHLORIDE 0.9% FLUSH: 3.0000 mL | INTRAVENOUS | Status: DC | PRN

## 2016-07-23 MED ORDER — ALBUTEROL SULFATE (2.5 MG/3ML) 0.083% IN NEBU
2.5000 mg | INHALATION_SOLUTION | Freq: Four times a day (QID) | RESPIRATORY_TRACT | Status: DC
  Filled 2016-07-23: qty 3

## 2016-07-23 MED ORDER — LOSARTAN POTASSIUM 50 MG PO TABS
50.0000 mg | ORAL_TABLET | Freq: Two times a day (BID) | ORAL | Status: DC
  Administered 2016-07-23 – 2016-07-25 (×4): 50 mg via ORAL
  Filled 2016-07-23 (×5): qty 1

## 2016-07-23 MED ORDER — ONDANSETRON HCL 4 MG PO TABS: 4.0000 mg | ORAL_TABLET | Freq: Four times a day (QID) | ORAL | Status: DC | PRN

## 2016-07-23 MED ORDER — ATORVASTATIN CALCIUM 20 MG PO TABS
40.0000 mg | ORAL_TABLET | Freq: Every day | ORAL | Status: DC
  Administered 2016-07-24 – 2016-07-25 (×2): 40 mg via ORAL
  Filled 2016-07-23 (×2): qty 2

## 2016-07-23 MED ORDER — SODIUM CHLORIDE 0.9 % IV SOLN: 250.0000 mL | INTRAVENOUS | Status: DC | PRN
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
## 2016-07-23 NOTE — ED Notes (Signed)
Pt denies any needs at this time, family with pt. Pt A&O, NAD noted
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
## 2016-07-23 NOTE — ED Notes (Signed)
Per MD hold BP medication (metroprolol) at this time due to BP value
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
## 2016-07-24 ENCOUNTER — Observation Stay: Payer: No Typology Code available for payment source

## 2016-07-24 ENCOUNTER — Observation Stay
Admit: 2016-07-24 | Discharge: 2016-07-24 | Disposition: A | Discharge status: Home / Self Care | Payer: No Typology Code available for payment source | Attending: Internal Medicine | Admitting: Internal Medicine

## 2016-07-24 DIAGNOSIS — R55 Syncope and collapse: Secondary | ICD-10-CM | POA: Diagnosis not present

## 2016-07-24 LAB — BASIC METABOLIC PANEL
ANION GAP: 5 (ref 5–15)
BUN: 19 mg/dL (ref 6–20)
CALCIUM: 9 mg/dL (ref 8.9–10.3)
CHLORIDE: 110 mmol/L (ref 101–111)
CO2: 29 mmol/L (ref 22–32)
Creatinine, Ser: 0.73 mg/dL (ref 0.44–1.00)
GFR calc non Af Amer: >60 mL/min (ref >60)
Glucose, Bld: 112 mg/dL — ABNORMAL HIGH (ref 65–99)
Potassium: 3.5 mmol/L (ref 3.5–5.1)
Sodium: 144 mmol/L (ref 135–145)

## 2016-07-24 LAB — CBC
HCT: 37.1 % (ref 35.0–47.0)
HEMOGLOBIN: 11.9 g/dL — AB (ref 12.0–16.0)
MCH: 25.4 pg — AB (ref 26.0–34.0)
MCHC: 32.2 g/dL (ref 32.0–36.0)
MCV: 78.8 fL — ABNORMAL LOW (ref 80.0–100.0)
Platelets: 343 10*3/uL (ref 150–440)
RBC: 4.7 MIL/uL (ref 3.80–5.20)
RDW: 17.2 % — ABNORMAL HIGH (ref 11.5–14.5)
WBC: 8.2 10*3/uL (ref 3.6–11.0)

## 2016-07-24 LAB — ECHOCARDIOGRAM COMPLETE
Height: 64 in
Weight: 2335.11 oz

## 2016-07-24 LAB — GLUCOSE, CAPILLARY: GLUCOSE-CAPILLARY: 80 mg/dL (ref 65–99)

## 2016-07-24 LAB — TROPONIN I: Troponin I: <0.03 ng/mL (ref <0.03)

## 2016-07-24 MED ORDER — ALBUTEROL SULFATE (2.5 MG/3ML) 0.083% IN NEBU: 2.5000 mg | INHALATION_SOLUTION | Freq: Four times a day (QID) | RESPIRATORY_TRACT | Status: DC | PRN

## 2016-07-24 MED ORDER — AMLODIPINE BESYLATE 5 MG PO TABS
5.0000 mg | ORAL_TABLET | Freq: Every day | ORAL | Status: DC
  Administered 2016-07-24 – 2016-07-25 (×2): 5 mg via ORAL
  Filled 2016-07-24 (×2): qty 1

## 2016-07-24 NOTE — Progress Notes (Signed)
*  PRELIMINARY RESULTS* Echocardiogram 2D Echocardiogram has been performed.  Catherine Burgess 07/24/2016, 1:41 PM
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
## 2016-07-24 NOTE — Care Management Obs Status (Signed)
[facility] NOTIFICATION   Patient Details  Name: Catherine Burgess MRN: [id number] Date of Birth: 1935-10-03   Medicare Observation Status Notification Given:  Yes    Jolly Mango, RN 07/24/2016, 9:30 AM

## 2016-07-24 NOTE — Progress Notes (Signed)
Chaplain rounding unit visited with pt. Pt was not in the room at the time of this visit. [place] talked to pt's nurse who said the pt had been taken to Radiology. Vincent will make a follow visit with pt to provide pastoral care at an appropriate time.    07/24/16 1500  Clinical Encounter Type  Visited With Patient  Visit Type Initial;Spiritual support  Referral From [facility]
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
## 2016-07-24 NOTE — Progress Notes (Signed)
Upon assessment patient sitting comfortably in bed. No c/o pain, A&O x 4. Patient a little unsteady while ambulating her to bathroom. Patient stated her desire to go home but she wants to make sure her bp is better.

## 2016-07-24 NOTE — Progress Notes (Signed)
Reported to Dr. Anselm Jungling that patient's bp was elevated 171/75. He advised this nurse to give night dose of Losartan.

## 2016-07-25 ENCOUNTER — Observation Stay: Payer: No Typology Code available for payment source

## 2016-07-25 DIAGNOSIS — R55 Syncope and collapse: Secondary | ICD-10-CM | POA: Diagnosis not present

## 2016-07-25 LAB — HEMOGLOBIN A1C
Hgb A1c MFr Bld: 6.2 % — ABNORMAL HIGH (ref 4.8–5.6)
Mean Plasma Glucose: 131 mg/dL

## 2016-07-25 LAB — GLUCOSE, CAPILLARY: GLUCOSE-CAPILLARY: 106 mg/dL — AB (ref 65–99)

## 2016-07-25 MED ORDER — AMLODIPINE BESYLATE 5 MG PO TABS: 5.0000 mg | ORAL_TABLET | Freq: Every day | ORAL | 2 refills | Status: DC

## 2016-07-25 MED ORDER — GADOBENATE DIMEGLUMINE 529 MG/ML IV SOLN
13.0000 mL | Freq: Once | INTRAVENOUS | Status: AC | PRN
  Administered 2016-07-25: 13 mL via INTRAVENOUS

## 2016-07-25 NOTE — Progress Notes (Addendum)
Patient cleared by PT. Contacted Dr. Tressia Miners to let her know. DC order put in. Patient being discharged to home. DC and RX instructions given and patient acknowledged understanding. IV removed, belongings packed. Son will come to pick up patient.  Text paged Dr. Tressia Miners for work note for patient. Printed work note out and gave to patient.
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
## 2016-07-29 ENCOUNTER — Ambulatory Visit (INDEPENDENT_AMBULATORY_CARE_PROVIDER_SITE_OTHER): Payer: Medicare Other | Admitting: Family Medicine

## 2016-07-29 ENCOUNTER — Encounter: Payer: Self-pay | Admitting: Family Medicine

## 2016-07-29 VITALS — BP 136/64 | HR 92 | Temp 97.9°F | Resp 16 | Ht 64.0 in | Wt 139.4 lb

## 2016-07-29 DIAGNOSIS — Z09 Encounter for follow-up examination after completed treatment for conditions other than malignant neoplasm: Secondary | ICD-10-CM

## 2016-07-29 DIAGNOSIS — R55 Syncope and collapse: Secondary | ICD-10-CM

## 2016-07-29 DIAGNOSIS — D649 Anemia, unspecified: Secondary | ICD-10-CM | POA: Diagnosis not present

## 2016-07-29 LAB — HEMOGLOBIN AND HEMATOCRIT, BLOOD
HCT: 39.4 % (ref 35.0–45.0)
HEMOGLOBIN: 12.2 g/dL (ref 11.7–15.5)
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
## 2016-07-30 LAB — IRON,TIBC AND FERRITIN PANEL
%SAT: 7 % — ABNORMAL LOW (ref 11–50)
Ferritin: 21 ng/mL (ref 20–288)
IRON: 29 ug/dL — AB (ref 45–160)
TIBC: 397 ug/dL (ref 250–450)

## 2016-08-17 ENCOUNTER — Other Ambulatory Visit: Payer: Self-pay | Admitting: Family Medicine

## 2016-08-17 DIAGNOSIS — R790 Abnormal level of blood mineral: Secondary | ICD-10-CM

## 2016-08-17 DIAGNOSIS — D509 Iron deficiency anemia, unspecified: Secondary | ICD-10-CM

## 2016-08-17 LAB — POC HEMOCCULT BLD/STL (HOME/3-CARD/SCREEN)
FECAL OCCULT BLD: NEGATIVE
FECAL OCCULT BLD: NEGATIVE
FECAL OCCULT BLD: NEGATIVE

## 2016-08-19 ENCOUNTER — Other Ambulatory Visit: Payer: Self-pay | Admitting: Orthopedic Surgery

## 2016-08-19 DIAGNOSIS — Z96652 Presence of left artificial knee joint: Secondary | ICD-10-CM

## 2016-08-22 ENCOUNTER — Other Ambulatory Visit: Payer: Self-pay | Admitting: Family Medicine

## 2016-08-22 DIAGNOSIS — E785 Hyperlipidemia, unspecified: Secondary | ICD-10-CM

## 2016-08-28 ENCOUNTER — Ambulatory Visit (INDEPENDENT_AMBULATORY_CARE_PROVIDER_SITE_OTHER): Payer: Medicare Other | Admitting: Family Medicine

## 2016-08-28 ENCOUNTER — Encounter: Payer: Self-pay | Admitting: Family Medicine

## 2016-08-28 VITALS — BP 128/74 | HR 84 | Temp 97.9°F | Resp 16 | Ht 64.0 in | Wt 140.5 lb

## 2016-08-28 DIAGNOSIS — R55 Syncope and collapse: Secondary | ICD-10-CM | POA: Diagnosis not present
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
## 2016-08-31 ENCOUNTER — Ambulatory Visit
Admission: RE | Admit: 2016-08-31 | Discharge: 2016-08-31 | Disposition: A | Discharge status: Home / Self Care | Payer: Medicare Other | Source: Ambulatory Visit | Attending: Orthopedic Surgery | Admitting: Orthopedic Surgery

## 2016-08-31 DIAGNOSIS — M25562 Pain in left knee: Secondary | ICD-10-CM | POA: Insufficient documentation

## 2016-08-31 DIAGNOSIS — Z96652 Presence of left artificial knee joint: Secondary | ICD-10-CM | POA: Diagnosis present

## 2016-08-31 MED ORDER — TECHNETIUM TC 99M MEDRONATE IV KIT
21.7340 | PACK | Freq: Once | INTRAVENOUS | Status: AC | PRN
  Administered 2016-08-31: 21.734 via INTRAVENOUS

## 2016-10-05 ENCOUNTER — Ambulatory Visit (INDEPENDENT_AMBULATORY_CARE_PROVIDER_SITE_OTHER): Payer: Medicare Other

## 2016-10-05 VITALS — BP 142/70 | HR 80 | Temp 98.1°F | Ht 64.0 in | Wt 142.2 lb

## 2016-10-05 DIAGNOSIS — Z0000 Encounter for general adult medical examination without abnormal findings: Secondary | ICD-10-CM

## 2016-10-05 NOTE — Patient Instructions (Signed)
Catherine Burgess , Thank you for taking time to come for your Medicare Wellness Visit. I appreciate your ongoing commitment to your health goals. Please review the following plan we discussed and let me know if I can assist you in the future.   Screening recommendations/referrals: Colonoscopy: up to date, no longer required Mammogram: no longer required and pt declines Bone Density: up to date Recommended yearly ophthalmology/optometry visit for glaucoma screening and checkup Recommended yearly dental visit for hygiene and checkup  Vaccinations: Influenza vaccine: declined today Pneumococcal vaccine: Prevnar 79 given 04/26/13, declined Pneumovax 79 today. Tdap vaccine: up to date, due 08/2022 Shingles vaccine: declined  Advanced directives: Please bring a copy of your POA (Power of [place]) and/or Living Will to your next appointment.   Conditions/risks identified: Recommend increasing water intake to 4-5 glasses a day.  Next appointment: 10/20/16 @ 11:00 AM  Preventive Care 79 Years and Older, Female Preventive care refers to lifestyle choices and visits with your health care provider that can promote health and wellness. What does preventive care include?  A yearly physical exam. This is also called an annual well check.  Dental exams once or twice a year.  Routine eye exams. Ask your health care provider how often you should have your eyes checked.  Personal lifestyle choices, including:  Daily care of your teeth and gums.  Regular physical activity.  Eating a healthy diet.  Avoiding tobacco and drug use.  Limiting alcohol use.  Practicing safe sex.  Taking low-dose aspirin every day.  Taking vitamin and mineral supplements as recommended by your health care provider. What happens during an annual well check? The services and screenings done by your health care provider during your annual well check will depend on your age, overall health, lifestyle risk factors, and  family history of disease. Counseling  Your health care provider may ask you questions about your:  Alcohol use.  Tobacco use.  Drug use.  Emotional well-being.  Home and relationship well-being.  Sexual activity.  Eating habits.  History of falls.  Memory and ability to understand (cognition).  Work and work Statistician.  Reproductive health. Screening  You may have the following tests or measurements:  Height, weight, and BMI.  Blood pressure.  Lipid and cholesterol levels. These may be checked every 5 years, or more frequently if you are over 21 years old.  Skin check.  Lung cancer screening. You may have this screening every year starting at age 51 if you have a 30-pack-year history of smoking and currently smoke or have quit within the past 15 years.  Fecal occult blood test (FOBT) of the stool. You may have this test every year starting at age 36.  Flexible sigmoidoscopy or colonoscopy. You may have a sigmoidoscopy every 5 years or a colonoscopy every 10 years starting at age 56.  Hepatitis C blood test.  Hepatitis B blood test.  Sexually transmitted disease (STD) testing.  Diabetes screening. This is done by checking your blood sugar (glucose) after you have not eaten for a while (fasting). You may have this done every 1-3 years.  Bone density scan. This is done to screen for osteoporosis. You may have this done starting at age 74.  Mammogram. This may be done every 1-2 years. Talk to your health care provider about how often you should have regular mammograms. Talk with your health care provider about your test results, treatment options, and if necessary, the need for more tests. Vaccines  Your health care provider  may recommend certain vaccines, such as:  Influenza vaccine. This is recommended every year.  Tetanus, diphtheria, and acellular pertussis (Tdap, Td) vaccine. You may need a Td booster every 10 years.  Zoster vaccine. You may need this  after age 83.  Pneumococcal 13-valent conjugate (PCV13) vaccine. One dose is recommended after age 5.  Pneumococcal polysaccharide (PPSV23) vaccine. One dose is recommended after age 17. Talk to your health care provider about which screenings and vaccines you need and how often you need them. This information is not intended to replace advice given to you by your health care provider. Make sure you discuss any questions you have with your health care provider. Document Released: 02/22/2015 Document Revised: 10/16/2015 Document Reviewed: 11/27/2014 Elsevier Interactive Patient Education  2017 [employer] Prevention in the Home Falls can cause injuries. They can happen to people of all ages. There are many things you can do to make your home safe and to help prevent falls. What can I do on the outside of my home?  Regularly fix the edges of walkways and driveways and fix any cracks.  Remove anything that might make you trip as you walk through a door, such as a raised step or threshold.  Trim any bushes or trees on the path to your home.  Use bright outdoor lighting.  Clear any walking paths of anything that might make someone trip, such as rocks or tools.  Regularly check to see if handrails are loose or broken. Make sure that both sides of any steps have handrails.  Any raised decks and porches should have guardrails on the edges.  Have any leaves, snow, or ice cleared regularly.  Use sand or salt on walking paths during winter.  Clean up any spills in your garage right away. This includes oil or grease spills. What can I do in the bathroom?  Use night lights.  Install grab bars by the toilet and in the tub and shower. Do not use towel bars as grab bars.  Use non-skid mats or decals in the tub or shower.  If you need to sit down in the shower, use a plastic, non-slip stool.  Keep the floor dry. Clean up any water that spills on the floor as soon as it  happens.  Remove soap buildup in the tub or shower regularly.  Attach bath mats securely with double-sided non-slip rug tape.  Do not have throw rugs and other things on the floor that can make you trip. What can I do in the bedroom?  Use night lights.  Make sure that you have a light by your bed that is easy to reach.  Do not use any sheets or blankets that are too big for your bed. They should not hang down onto the floor.  Have a firm chair that has side arms. You can use this for support while you get dressed.  Do not have throw rugs and other things on the floor that can make you trip. What can I do in the kitchen?  Clean up any spills right away.  Avoid walking on wet floors.  Keep items that you use a lot in easy-to-reach places.  If you need to reach something above you, use a strong step stool that has a grab bar.  Keep electrical cords out of the way.  Do not use floor polish or wax that makes floors slippery. If you must use wax, use non-skid floor wax.  Do not have throw rugs  and other things on the floor that can make you trip. What can I do with my stairs?  Do not leave any items on the stairs.  Make sure that there are handrails on both sides of the stairs and use them. Fix handrails that are broken or loose. Make sure that handrails are as long as the stairways.  Check any carpeting to make sure that it is firmly attached to the stairs. Fix any carpet that is loose or worn.  Avoid having throw rugs at the top or bottom of the stairs. If you do have throw rugs, attach them to the floor with carpet tape.  Make sure that you have a light switch at the top of the stairs and the bottom of the stairs. If you do not have them, ask someone to add them for you. What else can I do to help prevent falls?  Wear shoes that:  Do not have high heels.  Have rubber bottoms.  Are comfortable and fit you well.  Are closed at the toe. Do not wear sandals.  If you  use a stepladder:  Make sure that it is fully opened. Do not climb a closed stepladder.  Make sure that both sides of the stepladder are locked into place.  Ask someone to hold it for you, if possible.  Clearly mark and make sure that you can see:  Any grab bars or handrails.  First and last steps.  Where the edge of each step is.  Use tools that help you move around (mobility aids) if they are needed. These include:  Canes.  Walkers.  Scooters.  Crutches.  Turn on the lights when you go into a dark area. Replace any light bulbs as soon as they burn out.  Set up your furniture so you have a clear path. Avoid moving your furniture around.  If any of your floors are uneven, fix them.  If there are any pets around you, be aware of where they are.  Review your medicines with your doctor. Some medicines can make you feel dizzy. This can increase your chance of falling. Ask your doctor what other things that you can do to help prevent falls. This information is not intended to replace advice given to you by your health care provider. Make sure you discuss any questions you have with your health care provider. Document Released: 11/22/2008 Document Revised: 07/04/2015 Document Reviewed: 03/02/2014 Elsevier Interactive Patient Education  2017 [employer].

## 2016-10-05 NOTE — Progress Notes (Signed)
Subjective:   Catherine Burgess is a 81 y.o. female who presents for Medicare Annual (Subsequent) preventive examination.  Review of Systems:  N/A  Cardiac Risk Factors include: advanced age (>77men, >74 women);dyslipidemia;hypertension     Objective:     Vitals: BP (!) 142/70 (BP Location: Left Arm)   Pulse 80   Temp 98.1 F (36.7 C) (Oral)   Ht 5\' 4"  (1.626 m)   Wt 142 lb 3.2 oz (64.5 kg)   BMI 24.41 kg/m   Body mass index is 24.41 kg/m.   Tobacco History  Smoking Status  . Never Smoker  Smokeless Tobacco  . Never Used     Counseling given: Not Answered   Past Medical History:  Diagnosis Date  . Degenerative joint disease   . Hyperlipidemia   . Hypertension   . Osteoarthrosis   . Reflux esophagitis    Past Surgical History:  Procedure Laterality Date  . REPLACEMENT TOTAL KNEE Left 11/12/2009  . TOTAL HIP ARTHROPLASTY Left 11/12/2009   Family History  Problem Relation Age of Onset  . Emphysema Father   . CAD Mother    History  Sexual Activity  . Sexual activity: Not Currently    Outpatient Encounter Prescriptions as of 10/05/2016  Medication Sig  . acetaminophen (TYLENOL) 500 MG tablet Take 1 tablet (500 mg total) by mouth 2 (two) times daily.  [name] amLODipine (NORVASC) 5 MG tablet Take 1 tablet (5 mg total) by mouth daily.  [name] aspirin EC 81 MG tablet Take 1 tablet (81 mg total) by mouth daily.  [name] atorvastatin (LIPITOR) 40 MG tablet take 1 tablet by mouth once daily  . Calcium-Vitamin D 600-200 MG-UNIT tablet Take 1 tablet by mouth daily.   [name] gabapentin (NEURONTIN) 300 MG capsule take 1 capsule by mouth two times a day (Patient taking differently: Take 300 mg by mouth 2 (two) times daily. )  . losartan (COZAAR) 50 MG tablet take 1 tablet by mouth once daily  . meloxicam (MOBIC) 15 MG tablet Take 1 tablet by mouth daily.  . pantoprazole (PROTONIX) 40 MG tablet Take 1 tablet (40 mg total) by mouth daily.  . traMADol-acetaminophen (ULTRACET) 37.5-325  MG tablet Take 1 tablet by mouth every 8 (eight) hours as needed.   No facility-administered encounter medications on file as of 10/05/2016.     Activities of Daily Living In your present state of health, do you have any difficulty performing the following activities: 10/05/2016 07/23/2016  Hearing? N N  Vision? N N  Comment - -  Difficulty concentrating or making decisions? N N  Walking or climbing stairs? Y Y  Dressing or bathing? N N  Doing errands, shopping? Y N  Comment referral sent to [facility] to help with transportation -  Preparing Food and eating ? N -  Using the Toilet? N -  In the past six months, have you accidently leaked urine? Y -  Comment wears protection -  Do you have problems with loss of bowel control? N -  Managing your Medications? N -  Managing your Finances? N -  Housekeeping or managing your Housekeeping? N -  Some recent data might be hidden    Patient Care Team: Steele Sizer, MD as PCP - General (Family Medicine)    Assessment:     Exercise Activities and Dietary recommendations Current Exercise Habits: The patient does not participate in regular exercise at present, Exercise limited by: orthopedic condition(s) (left knee pain)  Goals    .  Increase water intake          Recommend increasing water intake to 4-5 glasses a day.      Fall Risk Fall Risk  10/05/2016 07/01/2016 03/05/2016 12/17/2015 10/28/2015  Falls in the past year? No No No No No  Number falls in past yr: - - - - -  Injury with Fall? - - - - -  Follow up - - - - -   Depression Screen PHQ 2/9 Scores 10/05/2016 07/01/2016 03/05/2016 12/17/2015  PHQ - 2 Score 0 0 0 0     Cognitive Function- Pt declined screening today.        Immunization History  Administered Date(s) Administered  . Influenza-Unspecified 10/31/2014, 12/05/2015  . Pneumococcal Conjugate-13 04/26/2013   Screening Tests Health Maintenance  Topic Date Due  . INFLUENZA VACCINE  09/09/2016  . PNA vac Low  Risk Adult (2 of 2 - PPSV23) 09/22/2017 (Originally 04/27/2014)  . TETANUS/TDAP  08/24/2022  . DEXA SCAN  Completed      Plan:  I have personally reviewed and addressed the Medicare Annual Wellness questionnaire and have noted the following in the patient's chart:  A. Medical and social history B. Use of alcohol, tobacco or illicit drugs  C. Current medications and supplements D. Functional ability and status E.  Nutritional status F.  Physical activity G. Advance directives H. List of other physicians I.  Hospitalizations, surgeries, and ER visits in previous 12 months J.  [place] such as hearing and vision if needed, cognitive and depression L. Referrals and appointments - none  In addition, I have reviewed and discussed with patient certain preventive protocols, quality metrics, and best practice recommendations. A written personalized care plan for preventive services as well as general preventive health recommendations were provided to patient.  See attached scanned questionnaire for additional information.   Signed,  Fabio Neighbors, LPN Nurse Health Advisor   MD Recommendations: None. Pt declined Pneumovax 23 and influenza vaccines today.   I have reviewed this encounter including the documentation in this note and/or discussed this patient with the Johney Maine, FNP, NP-C. I am certifying that I agree with the content of this note as supervising physician.  Steele Sizer, MD [facility] 10/05/2016, 11:22 AM

## 2016-10-13 ENCOUNTER — Ambulatory Visit (INDEPENDENT_AMBULATORY_CARE_PROVIDER_SITE_OTHER): Payer: Medicare Other | Admitting: Family Medicine

## 2016-10-13 ENCOUNTER — Encounter: Payer: Self-pay | Admitting: Family Medicine

## 2016-10-13 VITALS — BP 138/86 | HR 80 | Temp 98.1°F | Resp 18 | Ht 62.0 in | Wt 144.1 lb

## 2016-10-13 DIAGNOSIS — Z23 Encounter for immunization: Secondary | ICD-10-CM | POA: Diagnosis not present

## 2016-10-13 DIAGNOSIS — Z862 Personal history of diseases of the blood and blood-forming organs and certain disorders involving the immune mechanism: Secondary | ICD-10-CM | POA: Diagnosis not present

## 2016-10-13 DIAGNOSIS — K219 Gastro-esophageal reflux disease without esophagitis: Secondary | ICD-10-CM | POA: Diagnosis not present

## 2016-10-13 DIAGNOSIS — M170 Bilateral primary osteoarthritis of knee: Secondary | ICD-10-CM

## 2016-10-13 DIAGNOSIS — E7800 Pure hypercholesterolemia, unspecified: Secondary | ICD-10-CM | POA: Diagnosis not present

## 2016-10-13 DIAGNOSIS — I10 Essential (primary) hypertension: Secondary | ICD-10-CM | POA: Diagnosis not present

## 2016-10-13 DIAGNOSIS — R739 Hyperglycemia, unspecified: Secondary | ICD-10-CM

## 2016-10-13 DIAGNOSIS — E559 Vitamin D deficiency, unspecified: Secondary | ICD-10-CM | POA: Diagnosis not present

## 2016-10-13 DIAGNOSIS — Z9660 Presence of unspecified orthopedic joint implant: Secondary | ICD-10-CM

## 2016-10-13 MED ORDER — AMLODIPINE BESYLATE 5 MG PO TABS: 5.0000 mg | ORAL_TABLET | Freq: Every day | ORAL | 1 refills | Status: DC

## 2016-10-13 MED ORDER — PANTOPRAZOLE SODIUM 40 MG PO TBEC: 40.0000 mg | DELAYED_RELEASE_TABLET | Freq: Every day | ORAL | 1 refills | Status: DC

## 2016-10-13 MED ORDER — LOSARTAN POTASSIUM 50 MG PO TABS: 50.0000 mg | ORAL_TABLET | Freq: Every day | ORAL | 1 refills | Status: DC

## 2016-10-13 NOTE — Progress Notes (Signed)
Name: Catherine Burgess   MRN: [id number]    DOB: Apr 19, 1935   Date:10/13/2016       Progress Note  Subjective  Chief Complaint  Chief Complaint  Patient presents with  . Hypertension  . Hyperlipidemia  . Knee Pain    HPI  HTN : BP is at goal today, on Norvasc and Losartan , continue bp medication, no chest pain or palpitation   Hyperlipidemia: taking Atorvastatin, reviewed labs done 10/2015 , doing well , no myalgias . We will recheck it today   OA: both knees, has daily pain, currently on Tylenol prn  and Gabapentin. Sees Dr. Jovita Kussmaul  s/p total left knee replacement, she has decrease rom of left knee and pain has been getting progressively worse, she will go back for revision 11/2016   Hyperglycemia: she denies polyphagia, polyuria, she states she has polyuria. She is still likes bread, but trying to avoid bread and desserts. Last hgbA1C was done while at [facility] and it was 6.2% , too soon to recheck today, we will check fasting insulin and reminded her of importance of diet   Urinary urgency: she had similar symptoms last Nov and had an UTI, she denies dysuria or hematuria  Neck pain: doing better, no radiculitis, taking gabapentin twice daily, denies mental fogginess or fatigue  She denies any weakness  GERD: no symptoms at this time, denies regurgitation or heartburn, she has not been trying to wean self off .  Syncope: admitted to [facility] after syncopal episode 07/2016 going to have EEG for 3 days soon. No other episodes.   Patient Active Problem List   Diagnosis Date Noted  . History of anemia 10/13/2016  . Syncope 07/23/2016  . Malignant essential hypertension 07/23/2016  . Hypokalemia 07/23/2016  . Abnormal CT of brain 07/23/2016  . Vitamin D deficiency 10/28/2015  . Post menopausal syndrome 10/28/2015  . OP (osteoporosis) 10/28/2015  . Cervical disc disease 10/28/2015  . H/O total knee replacement 01/20/2015  . Vaginal atrophy 09/26/2014  . Osteoarthritis of  both knees 07/31/2014  . GERD without esophagitis 07/31/2014  . HLD (hyperlipidemia) 07/31/2014  . Hypertension, benign 07/31/2014  . History of artificial joint 05/18/2013    Past Surgical History:  Procedure Laterality Date  . REPLACEMENT TOTAL KNEE Left 11/12/2009  . TOTAL HIP ARTHROPLASTY Left 11/12/2009    Family History  Problem Relation Age of Onset  . Emphysema Father        Smoker  . CAD Mother   . Stroke Mother   . Diabetes Brother   . Seizures Brother   . Cancer Brother   . Diabetes Brother     Social History   Social History  . Marital status: Single    Spouse name: N/A  . Number of children: N/A  . Years of education: N/A   Occupational History  . Not on file.   Social History Main Topics  . Smoking status: Never Smoker  . Smokeless tobacco: Never Used  . Alcohol use No  . Drug use: No  . Sexual activity: Not Currently   Other Topics Concern  . Not on file   Social History Narrative  . No narrative on file     Current Outpatient Prescriptions:  .  acetaminophen (TYLENOL) 500 MG tablet, Take 1 tablet (500 mg total) by mouth 2 (two) times daily., Disp: 180 tablet, Rfl: 0 .  amLODipine (NORVASC) 5 MG tablet, Take 1 tablet (5 mg total) by mouth daily., Disp: 90 tablet, Rfl:  1 .  aspirin EC 81 MG tablet, Take 1 tablet (81 mg total) by mouth daily., Disp: 30 tablet, Rfl: 0 .  atorvastatin (LIPITOR) 40 MG tablet, take 1 tablet by mouth once daily, Disp: 90 tablet, Rfl: 0 .  Calcium-Vitamin D 600-200 MG-UNIT tablet, Take 1 tablet by mouth daily. , Disp: , Rfl:  .  gabapentin (NEURONTIN) 300 MG capsule, take 1 capsule by mouth two times a day (Patient taking differently: Take 300 mg by mouth 2 (two) times daily. ), Disp: 180 capsule, Rfl: 1 .  losartan (COZAAR) 50 MG tablet, Take 1 tablet (50 mg total) by mouth daily., Disp: 90 tablet, Rfl: 1 .  meloxicam (MOBIC) 15 MG tablet, Take 1 tablet by mouth daily., Disp: , Rfl: 1 .  pantoprazole (PROTONIX) 40  MG tablet, Take 1 tablet (40 mg total) by mouth daily., Disp: 90 tablet, Rfl: 1 .  traMADol-acetaminophen (ULTRACET) 37.5-325 MG tablet, Take 1 tablet by mouth every 8 (eight) hours as needed., Disp: 90 tablet, Rfl: 0  Allergies  Allergen Reactions  . Lovastatin Other (See Comments)    chest  . Pravastatin     Other reaction(s): Muscle Pain chest  . Rosuvastatin     Other reaction(s): Muscle Pain chest Other reaction(s): Muscle Pain chest  . Statins     Other reaction(s): Muscle Pain chest     ROS  Constitutional: Negative for fever or weight change.  Respiratory: Negative for cough and shortness of breath.   Cardiovascular: Negative for chest pain or palpitations.  Gastrointestinal: Negative for abdominal pain, no bowel changes.  Musculoskeletal:Positive for gait problem ( using a cane)  and intermittent joint swelling - left knee  Skin: Negative for rash.  Neurological: Negative for dizziness or headache.  No other specific complaints in a complete review of systems (except as listed in HPI above).  Objective  Vitals:   10/13/16 1041  BP: 138/86  Pulse: 80  Resp: 18  Temp: 98.1 F (36.7 C)  TempSrc: Oral  SpO2: 98%  Weight: 144 lb 1.6 oz (65.4 kg)  Height: 5' 2"  (1.575 m)    Body mass index is 26.36 kg/m.  Physical Exam  Constitutional: Patient appears well-developed and well-nourished. No distress.  HEENT: head atraumatic, normocephalic, pupils equal and reactive to light, neck supple, throat within normal limits Cardiovascular: Normal rate, regular rhythm and normal heart sounds. No murmur heard. No BLE edema. Pulmonary/Chest: Effort normal and breath sounds normal. No respiratory distress. Abdominal: Soft. There is no tenderness. Psychiatric: Patient has a normal mood and affect. behavior is normal. Judgment and thought content normal. Muscular Skeletal: decrease rom of left knee, pain with rom of left knee, antalgic gait, flexion to 100 degrees  extension about 170 degree , normal ROM and no pain during exam of right knee, some deformities from OA on both hands  Recent Results (from the past 2160 hour(s))  Basic metabolic panel     Status: Abnormal   Collection Time: 07/23/16  3:26 PM  Result Value Ref Range   Sodium 142 135 - 145 mmol/L   Potassium 3.2 (L) 3.5 - 5.1 mmol/L   Chloride 108 101 - 111 mmol/L   CO2 25 22 - 32 mmol/L   Glucose, Bld 142 (H) 65 - 99 mg/dL   BUN 22 (H) 6 - 20 mg/dL   Creatinine, Ser 0.60 0.44 - 1.00 mg/dL   Calcium 9.0 8.9 - 10.3 mg/dL   GFR calc non Af Amer >60 >60 mL/min  GFR calc Af Amer >60 >60 mL/min    Comment: (NOTE) The eGFR has been calculated using the CKD EPI equation. This calculation has not been validated in all clinical situations. eGFR's persistently <60 mL/min signify possible Chronic Kidney Disease.    Anion gap 9 5 - 15  CBC     Status: Abnormal   Collection Time: 07/23/16  3:26 PM  Result Value Ref Range   WBC 7.5 3.6 - 11.0 K/uL   RBC 4.60 3.80 - 5.20 MIL/uL   Hemoglobin 11.7 (L) 12.0 - 16.0 g/dL   HCT 36.4 35.0 - 47.0 %   MCV 79.1 (L) 80.0 - 100.0 fL   MCH 25.5 (L) 26.0 - 34.0 pg   MCHC 32.2 32.0 - 36.0 g/dL   RDW 17.1 (H) 11.5 - 14.5 %   Platelets 342 150 - 440 K/uL  Troponin I     Status: None   Collection Time: 07/23/16  3:26 PM  Result Value Ref Range   Troponin I <0.03 <0.03 ng/mL  Hepatic function panel     Status: Abnormal   Collection Time: 07/23/16  3:26 PM  Result Value Ref Range   Total Protein 6.6 6.5 - 8.1 g/dL   Albumin 3.6 3.5 - 5.0 g/dL   AST 26 15 - 41 U/L   ALT 21 14 - 54 U/L   Alkaline Phosphatase 126 38 - 126 U/L   Total Bilirubin 0.4 0.3 - 1.2 mg/dL   Bilirubin, Direct <0.1 (L) 0.1 - 0.5 mg/dL   Indirect Bilirubin NOT CALCULATED 0.3 - 0.9 mg/dL  Magnesium     Status: None   Collection Time: 07/23/16  7:29 PM  Result Value Ref Range   Magnesium 1.9 1.7 - 2.4 mg/dL  Troponin I     Status: None   Collection Time: 07/23/16  7:29 PM   Result Value Ref Range   Troponin I <0.03 <0.03 ng/mL  Urinalysis, Complete w Microscopic     Status: Abnormal   Collection Time: 07/23/16  8:12 PM  Result Value Ref Range   Color, Urine STRAW (A) YELLOW   APPearance CLEAR (A) CLEAR   Specific Gravity, Urine 1.009 1.005 - 1.030   pH 7.0 5.0 - 8.0   Glucose, UA NEGATIVE NEGATIVE mg/dL   Hgb urine dipstick NEGATIVE NEGATIVE   Bilirubin Urine NEGATIVE NEGATIVE   Ketones, ur NEGATIVE NEGATIVE mg/dL   Protein, ur NEGATIVE NEGATIVE mg/dL   Nitrite NEGATIVE NEGATIVE   Leukocytes, UA NEGATIVE NEGATIVE   RBC / HPF 0-5 0 - 5 RBC/hpf   WBC, UA NONE SEEN 0 - 5 WBC/hpf   Bacteria, UA RARE (A) NONE SEEN   Squamous Epithelial / LPF 0-5 (A) NONE SEEN   Mucus PRESENT   Troponin I     Status: None   Collection Time: 07/23/16  8:27 PM  Result Value Ref Range   Troponin I <0.03 <0.03 ng/mL  TSH     Status: None   Collection Time: 07/23/16  8:27 PM  Result Value Ref Range   TSH 1.630 0.350 - 4.500 uIU/mL    Comment: Performed by a 3rd Generation assay with a functional sensitivity of <=0.01 uIU/mL.  Hemoglobin A1c     Status: Abnormal   Collection Time: 07/23/16  8:27 PM  Result Value Ref Range   Hgb A1c MFr Bld 6.2 (H) 4.8 - 5.6 %    Comment: (NOTE)         Pre-diabetes: 5.7 - 6.4  Diabetes: >6.4         Glycemic control for adults with diabetes: <7.0    Mean Plasma Glucose 131 mg/dL    Comment: (NOTE) Performed At: [facility] [place], [place] [id number] Lindon Romp MD [id number]   Troponin I     Status: None   Collection Time: 07/23/16 11:17 PM  Result Value Ref Range   Troponin I <0.03 <0.03 ng/mL  Troponin I     Status: None   Collection Time: 07/24/16  3:19 AM  Result Value Ref Range   Troponin I <0.03 <0.03 ng/mL  Glucose, capillary     Status: None   Collection Time: 07/24/16  7:41 AM  Result Value Ref Range   Glucose-Capillary 80 65 - 99 mg/dL  Troponin I     Status: None    Collection Time: 07/24/16  9:58 AM  Result Value Ref Range   Troponin I <0.03 <0.03 ng/mL  Basic metabolic panel     Status: Abnormal   Collection Time: 07/24/16  9:58 AM  Result Value Ref Range   Sodium 144 135 - 145 mmol/L   Potassium 3.5 3.5 - 5.1 mmol/L   Chloride 110 101 - 111 mmol/L   CO2 29 22 - 32 mmol/L   Glucose, Bld 112 (H) 65 - 99 mg/dL   BUN 19 6 - 20 mg/dL   Creatinine, Ser 0.73 0.44 - 1.00 mg/dL   Calcium 9.0 8.9 - 10.3 mg/dL   GFR calc non Af Amer >60 >60 mL/min   GFR calc Af Amer >60 >60 mL/min    Comment: (NOTE) The eGFR has been calculated using the CKD EPI equation. This calculation has not been validated in all clinical situations. eGFR's persistently <60 mL/min signify possible Chronic Kidney Disease.    Anion gap 5 5 - 15  CBC     Status: Abnormal   Collection Time: 07/24/16  9:58 AM  Result Value Ref Range   WBC 8.2 3.6 - 11.0 K/uL   RBC 4.70 3.80 - 5.20 MIL/uL   Hemoglobin 11.9 (L) 12.0 - 16.0 g/dL   HCT 37.1 35.0 - 47.0 %   MCV 78.8 (L) 80.0 - 100.0 fL   MCH 25.4 (L) 26.0 - 34.0 pg   MCHC 32.2 32.0 - 36.0 g/dL   RDW 17.2 (H) 11.5 - 14.5 %   Platelets 343 150 - 440 K/uL  ECHOCARDIOGRAM COMPLETE     Status: None   Collection Time: 07/24/16  1:40 PM  Result Value Ref Range   Weight 2,335.11 oz   Height 64 in   BP 168/76 mmHg  Glucose, capillary     Status: Abnormal   Collection Time: 07/25/16  7:17 AM  Result Value Ref Range   Glucose-Capillary 106 (H) 65 - 99 mg/dL   Comment 1 Notify RN   Hemoglobin and hematocrit, blood     Status: None   Collection Time: 07/29/16 11:53 AM  Result Value Ref Range   Hemoglobin 12.2 11.7 - 15.5 g/dL   HCT 39.4 35.0 - 45.0 %  Iron, TIBC and Ferritin Panel     Status: Abnormal   Collection Time: 07/29/16 11:53 AM  Result Value Ref Range   Ferritin 21 20 - 288 ng/mL   Iron 29 (L) 45 - 160 ug/dL   TIBC 397 250 - 450 ug/dL   %SAT 7 (L) 11 - 50 %  POC Hemoccult Bld/Stl (3-Cd Home Screen)     Status: Normal  Collection Time: 08/17/16  4:27 PM  Result Value Ref Range   Card #1 Date     Fecal Occult Blood, POC Negative Negative   Card #2 Date     Card #2 Fecal Occult Blod, POC Negative    Card #3 Date     Card #3 Fecal Occult Blood, POC Negative       PHQ2/9: Depression screen [facility] 2/9 10/05/2016 07/01/2016 03/05/2016 12/17/2015 10/28/2015  Decreased Interest 0 0 0 0 0  Down, Depressed, Hopeless 0 0 0 0 0  PHQ - 2 Score 0 0 0 0 0     Fall Risk: Fall Risk  10/05/2016 07/01/2016 03/05/2016 12/17/2015 10/28/2015  Falls in the past year? No No No No No  Number falls in past yr: - - - - -  Injury with Fall? - - - - -  Follow up - - - - -    Assessment & Plan  1. Essential hypertension  - amLODipine (NORVASC) 5 MG tablet; Take 1 tablet (5 mg total) by mouth daily.  Dispense: 90 tablet; Refill: 1 - losartan (COZAAR) 50 MG tablet; Take 1 tablet (50 mg total) by mouth daily.  Dispense: 90 tablet; Refill: 1 - COMPLETE METABOLIC PANEL WITH GFR - CBC with Differential/Platelet  2. Pure hypercholesterolemia  - Lipid panel  3. Osteoarthritis of both knees, unspecified osteoarthritis type  Seeing Dr. Marry Guan , reminded her to wean self off Meloxicam if possible  4. History of artificial joint  Having a revision October 2018  5. GERD without esophagitis  - pantoprazole (PROTONIX) 40 MG tablet; Take 1 tablet (40 mg total) by mouth daily.  Dispense: 90 tablet; Refill: 1  6. Hyperglycemia  - Hemoglobin A1c - Insulin, fasting  7. Need for pneumococcal vaccination  - Pneumococcal polysaccharide vaccine 23-valent greater than or equal to 2yo subcutaneous/IM  8. Needs flu shot  - Flu vaccine HIGH DOSE PF  9. Vitamin D deficiency  - VITAMIN D 25 Hydroxy (Vit-D Deficiency, Fractures)  10. History of anemia  Hemoccult stools negative and HCT improved, we will recheck today

## 2016-10-14 LAB — CBC WITH DIFFERENTIAL/PLATELET
BASOS ABS: 0 {cells}/uL (ref 0–200)
Basophils Relative: 0 %
Eosinophils Absolute: 144 cells/uL (ref 15–500)
Eosinophils Relative: 2 %
HCT: 40.2 % (ref 35.0–45.0)
HEMOGLOBIN: 12.4 g/dL (ref 11.7–15.5)
LYMPHS ABS: 1800 {cells}/uL (ref 850–3900)
LYMPHS PCT: 25 %
MCH: 25.9 pg — AB (ref 27.0–33.0)
MCHC: 30.8 g/dL — ABNORMAL LOW (ref 32.0–36.0)
MCV: 83.9 fL (ref 80.0–100.0)
MONOS PCT: 6 %
MPV: 9.3 fL (ref 7.5–12.5)
Monocytes Absolute: 432 cells/uL (ref 200–950)
NEUTROS PCT: 67 %
Neutro Abs: 4824 cells/uL (ref 1500–7800)
PLATELETS: 364 10*3/uL (ref 140–400)
RBC: 4.79 MIL/uL (ref 3.80–5.10)
RDW: 16.6 % — AB (ref 11.0–15.0)
WBC: 7.2 10*3/uL (ref 3.8–10.8)

## 2016-10-14 LAB — COMPLETE METABOLIC PANEL WITH GFR
ALBUMIN: 3.9 g/dL (ref 3.6–5.1)
ALK PHOS: 144 U/L — AB (ref 33–130)
ALT: 22 U/L (ref 6–29)
AST: 24 U/L (ref 10–35)
BILIRUBIN TOTAL: 0.4 mg/dL (ref 0.2–1.2)
BUN: 20 mg/dL (ref 7–25)
CALCIUM: 9.1 mg/dL (ref 8.6–10.4)
CO2: 32 mmol/L (ref 20–32)
Chloride: 104 mmol/L (ref 98–110)
Creat: 0.68 mg/dL (ref 0.60–0.93)
GFR, Est African American: >89 mL/min (ref >=60)
GFR, Est Non African American: 84 mL/min (ref >=60)
GLUCOSE: 93 mg/dL (ref 65–99)
Potassium: 4.6 mmol/L (ref 3.5–5.3)
Sodium: 141 mmol/L (ref 135–146)
TOTAL PROTEIN: 6.3 g/dL (ref 6.1–8.1)

## 2016-10-14 LAB — LIPID PANEL
CHOL/HDL RATIO: 2.4 ratio (ref <5.0)
CHOLESTEROL: 111 mg/dL (ref <200)
HDL: 46 mg/dL — ABNORMAL LOW (ref >50)
LDL CALC: 49 mg/dL (ref <100)
TRIGLYCERIDES: 79 mg/dL (ref <150)
VLDL: 16 mg/dL (ref <30)

## 2016-10-14 LAB — HEMOGLOBIN A1C
Hgb A1c MFr Bld: 6.1 % — ABNORMAL HIGH (ref <5.7)
Mean Plasma Glucose: 128 mg/dL

## 2016-10-14 LAB — INSULIN, FASTING: Insulin fasting, serum: 6.6 u[IU]/mL (ref 2.0–19.6)

## 2016-10-16 LAB — VITAMIN D 25 HYDROXY (VIT D DEFICIENCY, FRACTURES): Vit D, 25-Hydroxy: 32 ng/mL (ref 30–100)

## 2016-10-26 ENCOUNTER — Encounter: Payer: Self-pay | Admitting: Family Medicine

## 2016-10-27 ENCOUNTER — Encounter: Payer: Self-pay | Admitting: Family Medicine

## 2016-10-30 ENCOUNTER — Ambulatory Visit: Payer: Medicare Other | Admitting: Family Medicine

## 2016-11-02 ENCOUNTER — Ambulatory Visit: Payer: Medicare Other | Admitting: Family Medicine

## 2016-11-24 ENCOUNTER — Encounter
Admission: RE | Admit: 2016-11-24 | Discharge: 2016-11-24 | Disposition: A | Discharge status: Home / Self Care | Payer: Medicare Other | Source: Ambulatory Visit | Attending: Orthopedic Surgery | Admitting: Orthopedic Surgery

## 2016-11-24 DIAGNOSIS — R9431 Abnormal electrocardiogram [ECG] [EKG]: Secondary | ICD-10-CM | POA: Diagnosis not present

## 2016-11-24 DIAGNOSIS — Z791 Long term (current) use of non-steroidal anti-inflammatories (NSAID): Secondary | ICD-10-CM | POA: Diagnosis not present

## 2016-11-24 DIAGNOSIS — Z8249 Family history of ischemic heart disease and other diseases of the circulatory system: Secondary | ICD-10-CM | POA: Diagnosis not present

## 2016-11-24 DIAGNOSIS — K209 Esophagitis, unspecified: Secondary | ICD-10-CM | POA: Diagnosis not present

## 2016-11-24 DIAGNOSIS — Z888 Allergy status to other drugs, medicaments and biological substances status: Secondary | ICD-10-CM | POA: Diagnosis not present

## 2016-11-24 DIAGNOSIS — E785 Hyperlipidemia, unspecified: Secondary | ICD-10-CM | POA: Diagnosis not present

## 2016-11-24 DIAGNOSIS — Z825 Family history of asthma and other chronic lower respiratory diseases: Secondary | ICD-10-CM | POA: Diagnosis not present

## 2016-11-24 DIAGNOSIS — Z01810 Encounter for preprocedural cardiovascular examination: Secondary | ICD-10-CM | POA: Diagnosis present

## 2016-11-24 DIAGNOSIS — I10 Essential (primary) hypertension: Secondary | ICD-10-CM | POA: Diagnosis not present

## 2016-11-24 DIAGNOSIS — T84033A Mechanical loosening of internal left knee prosthetic joint, initial encounter: Secondary | ICD-10-CM | POA: Diagnosis not present

## 2016-11-24 DIAGNOSIS — Z8042 Family history of malignant neoplasm of prostate: Secondary | ICD-10-CM | POA: Insufficient documentation

## 2016-11-24 DIAGNOSIS — Z79899 Other long term (current) drug therapy: Secondary | ICD-10-CM | POA: Diagnosis not present

## 2016-11-24 DIAGNOSIS — M1990 Unspecified osteoarthritis, unspecified site: Secondary | ICD-10-CM | POA: Diagnosis not present

## 2016-11-24 DIAGNOSIS — Z9889 Other specified postprocedural states: Secondary | ICD-10-CM | POA: Diagnosis not present

## 2016-11-24 DIAGNOSIS — Z01812 Encounter for preprocedural laboratory examination: Secondary | ICD-10-CM | POA: Diagnosis not present

## 2016-11-24 DIAGNOSIS — K219 Gastro-esophageal reflux disease without esophagitis: Secondary | ICD-10-CM | POA: Insufficient documentation

## 2016-11-24 DIAGNOSIS — X58XXXA Exposure to other specified factors, initial encounter: Secondary | ICD-10-CM | POA: Diagnosis not present

## 2016-11-24 LAB — COMPREHENSIVE METABOLIC PANEL
ALBUMIN: 3.8 g/dL (ref 3.5–5.0)
ALT: 26 U/L (ref 14–54)
AST: 27 U/L (ref 15–41)
Alkaline Phosphatase: 131 U/L — ABNORMAL HIGH (ref 38–126)
Anion gap: 9 (ref 5–15)
BUN: 21 mg/dL — AB (ref 6–20)
CHLORIDE: 105 mmol/L (ref 101–111)
CO2: 28 mmol/L (ref 22–32)
CREATININE: 0.89 mg/dL (ref 0.44–1.00)
Calcium: 9.4 mg/dL (ref 8.9–10.3)
GFR calc Af Amer: >60 mL/min (ref >60)
GFR calc non Af Amer: >60 mL/min (ref >60)
GLUCOSE: 90 mg/dL (ref 65–99)
POTASSIUM: 3.5 mmol/L (ref 3.5–5.1)
Sodium: 142 mmol/L (ref 135–145)
Total Bilirubin: 0.6 mg/dL (ref 0.3–1.2)
Total Protein: 7.3 g/dL (ref 6.5–8.1)

## 2016-11-24 LAB — TYPE AND SCREEN
ABO/RH(D): O POS
ANTIBODY SCREEN: NEGATIVE

## 2016-11-24 LAB — URINALYSIS, ROUTINE W REFLEX MICROSCOPIC
BILIRUBIN URINE: NEGATIVE
Glucose, UA: NEGATIVE mg/dL
Hgb urine dipstick: NEGATIVE
KETONES UR: NEGATIVE mg/dL
Leukocytes, UA: NEGATIVE
NITRITE: NEGATIVE
PH: 7 (ref 5.0–8.0)
PROTEIN: NEGATIVE mg/dL
Specific Gravity, Urine: 1.005 (ref 1.005–1.030)

## 2016-11-24 LAB — SURGICAL PCR SCREEN
MRSA, PCR: NEGATIVE
Staphylococcus aureus: NEGATIVE

## 2016-11-24 LAB — APTT: APTT: 31 s (ref 24–36)

## 2016-11-24 LAB — CBC
HCT: 39.7 % (ref 35.0–47.0)
Hemoglobin: 12.9 g/dL (ref 12.0–16.0)
MCH: 26.3 pg (ref 26.0–34.0)
MCHC: 32.5 g/dL (ref 32.0–36.0)
MCV: 81 fL (ref 80.0–100.0)
PLATELETS: 326 10*3/uL (ref 150–440)
RBC: 4.9 MIL/uL (ref 3.80–5.20)
RDW: 17.7 % — AB (ref 11.5–14.5)
WBC: 7.9 10*3/uL (ref 3.6–11.0)

## 2016-11-24 LAB — PROTIME-INR
INR: 0.94
Prothrombin Time: 12.5 seconds (ref 11.4–15.2)

## 2016-11-24 LAB — SEDIMENTATION RATE: SED RATE: 17 mm/h (ref 0–30)

## 2016-11-24 LAB — C-REACTIVE PROTEIN: CRP: <0.8 mg/dL (ref <1.0)

## 2016-11-24 NOTE — Patient Instructions (Signed)
Your procedure is scheduled on: December 07, 2016 Report to Same Day Surgery on the 2nd floor in the [facility]. To find out your arrival time, please call [phone] between 1PM - 3PM on: December 04, 2016  REMEMBER: Instructions that are not followed completely may result in serious medical risk up to and including death; or upon the discretion of your surgeon and anesthesiologist your surgery may need to be rescheduled.  Do not eat food or drink liquids after midnight. No gum chewing or hard candies.  You may however, drink CLEAR liquids up to 2 hours before you are scheduled to arrive at the hospital for your procedure.  Do not drink clear liquids within 2 hours of your scheduled arrival to the hospital as this may lead to your procedure being delayed or rescheduled.  Clear liquids include: - water  - apple juice without pulp - clear gatorade - black coffee or tea (NO milk, creamers, sugars) DO NOT drink anything not on this list.  No Alcohol for 24 hours before or after surgery.  No Smoking for 24 hours prior to surgery.  Notify your doctor if there is any change in your medical condition (cold, fever, infection).  Do not wear jewelry, make-up, hairpins, clips or nail polish.  Do not wear lotions, powders, or perfumes.   Do not shave 48 hours prior to surgery. Men may shave face and neck.  Contacts and dentures may not be worn into surgery.  Do not bring valuables to the hospital. [facility] is not responsible for any belongings or valuables.   TAKE THESE MEDICATIONS THE MORNING OF SURGERY WITH A SIP OF WATER: AMLODIPINE LIPITOR GABAPENTIN PANTOPRAZOLE - TAKE DOSE NIGHT BEFORE SURGERY AND DAY OF SURGERY    Use CHG Soap or wipes as directed on instruction sheet.  Follow recommendations from Cardiologist, Pulmonologist or PCP regarding stopping Aspirin.  Stop Anti-inflammatories ON October 19 , 2018 such as MELOXICAM, Advil, Aleve, Ibuprofen, Motrin, Naproxen,  Naprosyn, Goodie powder, or aspirin products. (May take Tylenol or Acetaminophen and Celebrex if needed.)  Stop supplements until after surgery. (May continue Vitamin D, Vitamin B, and multivitamin.)  If you are being admitted to the hospital overnight, leave your suitcase in the car. After surgery it may be brought to your room.  If you are being discharged the day of surgery, you will not be allowed to drive home. You will need someone to drive you home and stay with you that night.   If you are taking public transportation, you will need to have a responsible adult to with you.  Please call the number above if you have any questions about these instructions.

## 2016-11-24 NOTE — OR Nursing (Addendum)
Called Dr Ronelle Nigh regarding syncope and past neurologist visit.  Clearance requested from neurology, Dr Manuella Ghazi. Dr Clydell Hakim office notified of above mentioned.

## 2016-11-25 LAB — URINE CULTURE
Culture: NO GROWTH
SPECIAL REQUESTS: NORMAL

## 2016-12-02 NOTE — Pre-Procedure Instructions (Addendum)
RECEIVED NOTE FROM DR [facility] WITH EXTENSIVE NEGATIVE NEURO WORKUP FOR SYNCOPE. FAXED REQUEST TO DR CALLWOOD TO [place]. HAD BENIGN 48 HOUR HOLTER 8/18/ SPOKE WITH BETSY. TIFFANY AT DR HOOTEN'S NOTIFED AND MAY BE AM BEFORE ANSWER.

## 2016-12-03 NOTE — Pre-Procedure Instructions (Signed)
DR P CARROLL OK WITH NEURO NOTE. STILL WAITING FOE NOTE FROM CALLWOOD. LM FOR TIFFANY AT DR Clydell Hakim

## 2016-12-04 NOTE — Pre-Procedure Instructions (Signed)
Called Dr Andree Elk regarding cardiac and neuro clearance.  "OK to proceed.[name]

## 2016-12-06 MED ORDER — CEFAZOLIN SODIUM-DEXTROSE 2-4 GM/100ML-% IV SOLN
2.0000 g | INTRAVENOUS | Status: AC
  Administered 2016-12-07: 2 g via INTRAVENOUS

## 2016-12-06 MED ORDER — TRANEXAMIC ACID 1000 MG/10ML IV SOLN
1000.0000 mg | INTRAVENOUS | Status: DC
  Administered 2016-12-07: 1000 mg via INTRAVENOUS
  Filled 2016-12-06 (×2): qty 10

## 2016-12-07 ENCOUNTER — Inpatient Hospital Stay: Payer: Medicare Other | Admitting: Anesthesiology

## 2016-12-07 ENCOUNTER — Inpatient Hospital Stay: Payer: Medicare Other

## 2016-12-07 ENCOUNTER — Encounter: Payer: Self-pay | Admitting: Orthopedic Surgery

## 2016-12-07 ENCOUNTER — Encounter
Admission: RE | Disposition: A | Discharge status: Skilled Nursing Facility | Payer: Self-pay | Source: Ambulatory Visit | Attending: Orthopedic Surgery

## 2016-12-07 ENCOUNTER — Inpatient Hospital Stay
Admission: RE | Admit: 2016-12-07 | Discharge: 2016-12-09 | DRG: 464 | Disposition: A | Discharge status: Skilled Nursing Facility | Payer: Medicare Other | Source: Ambulatory Visit | Attending: Orthopedic Surgery | Admitting: Orthopedic Surgery

## 2016-12-07 DIAGNOSIS — Z419 Encounter for procedure for purposes other than remedying health state, unspecified: Secondary | ICD-10-CM

## 2016-12-07 DIAGNOSIS — T84033A Mechanical loosening of internal left knee prosthetic joint, initial encounter: Secondary | ICD-10-CM | POA: Diagnosis present

## 2016-12-07 DIAGNOSIS — S72422A Displaced fracture of lateral condyle of left femur, initial encounter for closed fracture: Secondary | ICD-10-CM | POA: Diagnosis present

## 2016-12-07 DIAGNOSIS — Z96652 Presence of left artificial knee joint: Secondary | ICD-10-CM | POA: Diagnosis present

## 2016-12-07 DIAGNOSIS — I10 Essential (primary) hypertension: Secondary | ICD-10-CM | POA: Diagnosis present

## 2016-12-07 DIAGNOSIS — E876 Hypokalemia: Secondary | ICD-10-CM | POA: Diagnosis present

## 2016-12-07 DIAGNOSIS — M810 Age-related osteoporosis without current pathological fracture: Secondary | ICD-10-CM | POA: Diagnosis present

## 2016-12-07 DIAGNOSIS — E559 Vitamin D deficiency, unspecified: Secondary | ICD-10-CM | POA: Diagnosis present

## 2016-12-07 DIAGNOSIS — Y792 Prosthetic and other implants, materials and accessory orthopedic devices associated with adverse incidents: Secondary | ICD-10-CM | POA: Diagnosis present

## 2016-12-07 DIAGNOSIS — K219 Gastro-esophageal reflux disease without esophagitis: Secondary | ICD-10-CM | POA: Diagnosis present

## 2016-12-07 DIAGNOSIS — Z7982 Long term (current) use of aspirin: Secondary | ICD-10-CM

## 2016-12-07 DIAGNOSIS — E785 Hyperlipidemia, unspecified: Secondary | ICD-10-CM | POA: Diagnosis present

## 2016-12-07 DIAGNOSIS — Z96659 Presence of unspecified artificial knee joint: Secondary | ICD-10-CM

## 2016-12-07 HISTORY — PX: TOTAL KNEE REVISION: SHX996

## 2016-12-07 LAB — ABO/RH: ABO/RH(D): O POS

## 2016-12-07 SURGERY — TOTAL KNEE REVISION
Anesthesia: General | Site: Knee | Laterality: Left | Wound class: Clean

## 2016-12-07 MED ORDER — KETAMINE HCL 50 MG/ML IJ SOLN
INTRAMUSCULAR | Status: DC | PRN
  Administered 2016-12-07: 25 mg via INTRAMUSCULAR

## 2016-12-07 MED ORDER — GLYCOPYRROLATE 0.2 MG/ML IJ SOLN
INTRAMUSCULAR | Status: DC | PRN
  Administered 2016-12-07: 0.2 mg via INTRAVENOUS

## 2016-12-07 MED ORDER — FLEET ENEMA 7-19 GM/118ML RE ENEM: 1.0000 | ENEMA | Freq: Once | RECTAL | Status: DC | PRN

## 2016-12-07 MED ORDER — FENTANYL CITRATE (PF) 100 MCG/2ML IJ SOLN
INTRAMUSCULAR | Status: DC | PRN
  Administered 2016-12-07 (×2): 50 ug via INTRAVENOUS

## 2016-12-07 MED ORDER — PHENYLEPHRINE HCL 10 MG/ML IJ SOLN
INTRAMUSCULAR | Status: DC | PRN
  Administered 2016-12-07: 100 ug via INTRAVENOUS

## 2016-12-07 MED ORDER — PHENYLEPHRINE HCL 10 MG/ML IJ SOLN
INTRAMUSCULAR | Status: AC
  Filled 2016-12-07: qty 1

## 2016-12-07 MED ORDER — GABAPENTIN 300 MG PO CAPS
300.0000 mg | ORAL_CAPSULE | Freq: Two times a day (BID) | ORAL | Status: DC
  Administered 2016-12-07 – 2016-12-09 (×4): 300 mg via ORAL
  Filled 2016-12-07 (×4): qty 1

## 2016-12-07 MED ORDER — CEFAZOLIN SODIUM-DEXTROSE 2-4 GM/100ML-% IV SOLN
2.0000 g | Freq: Four times a day (QID) | INTRAVENOUS | Status: DC
  Administered 2016-12-07 – 2016-12-08 (×2): 2 g via INTRAVENOUS
  Filled 2016-12-07 (×7): qty 100

## 2016-12-07 MED ORDER — FERROUS SULFATE 325 (65 FE) MG PO TABS
325.0000 mg | ORAL_TABLET | Freq: Two times a day (BID) | ORAL | Status: DC
  Administered 2016-12-08 – 2016-12-09 (×3): 325 mg via ORAL
  Filled 2016-12-07 (×3): qty 1

## 2016-12-07 MED ORDER — VANCOMYCIN HCL 1000 MG IV SOLR
INTRAVENOUS | Status: AC
  Filled 2016-12-07: qty 3000

## 2016-12-07 MED ORDER — LOSARTAN POTASSIUM 50 MG PO TABS
50.0000 mg | ORAL_TABLET | Freq: Every day | ORAL | Status: DC
  Administered 2016-12-08: 50 mg via ORAL
  Filled 2016-12-07 (×2): qty 1

## 2016-12-07 MED ORDER — PROPOFOL 500 MG/50ML IV EMUL
INTRAVENOUS | Status: DC | PRN
  Administered 2016-12-07: 50 ug/kg/min via INTRAVENOUS

## 2016-12-07 MED ORDER — ONDANSETRON HCL 4 MG PO TABS: 4.0000 mg | ORAL_TABLET | Freq: Four times a day (QID) | ORAL | Status: DC | PRN

## 2016-12-07 MED ORDER — AMLODIPINE BESYLATE 5 MG PO TABS
5.0000 mg | ORAL_TABLET | Freq: Every day | ORAL | Status: DC
  Administered 2016-12-08: 5 mg via ORAL
  Filled 2016-12-07 (×2): qty 1

## 2016-12-07 MED ORDER — BUPIVACAINE LIPOSOME 1.3 % IJ SUSP
INTRAMUSCULAR | Status: AC
  Filled 2016-12-07: qty 20

## 2016-12-07 MED ORDER — BUPIVACAINE-EPINEPHRINE (PF) 0.25% -1:200000 IJ SOLN
INTRAMUSCULAR | Status: DC | PRN
  Administered 2016-12-07: 60 mL

## 2016-12-07 MED ORDER — PANTOPRAZOLE SODIUM 40 MG PO TBEC
40.0000 mg | DELAYED_RELEASE_TABLET | Freq: Two times a day (BID) | ORAL | Status: DC
  Administered 2016-12-07 – 2016-12-09 (×4): 40 mg via ORAL
  Filled 2016-12-07 (×4): qty 1

## 2016-12-07 MED ORDER — GLYCOPYRROLATE 0.2 MG/ML IJ SOLN
INTRAMUSCULAR | Status: AC
  Filled 2016-12-07: qty 1

## 2016-12-07 MED ORDER — FENTANYL CITRATE (PF) 100 MCG/2ML IJ SOLN
25.0000 ug | INTRAMUSCULAR | Status: DC | PRN
  Administered 2016-12-07 (×4): 25 ug via INTRAVENOUS

## 2016-12-07 MED ORDER — SODIUM CHLORIDE 0.9 % IJ SOLN
INTRAMUSCULAR | Status: AC
  Filled 2016-12-07: qty 50

## 2016-12-07 MED ORDER — SODIUM CHLORIDE 0.9 % IV SOLN
INTRAVENOUS | Status: DC
  Administered 2016-12-07: 23:00:00 via INTRAVENOUS

## 2016-12-07 MED ORDER — TRAMADOL HCL 50 MG PO TABS
50.0000 mg | ORAL_TABLET | ORAL | Status: DC | PRN
  Administered 2016-12-08 – 2016-12-09 (×4): 100 mg via ORAL
  Filled 2016-12-07 (×4): qty 2

## 2016-12-07 MED ORDER — METOCLOPRAMIDE HCL 10 MG PO TABS
10.0000 mg | ORAL_TABLET | Freq: Three times a day (TID) | ORAL | Status: DC
  Administered 2016-12-07 – 2016-12-09 (×6): 10 mg via ORAL
  Filled 2016-12-07 (×6): qty 1

## 2016-12-07 MED ORDER — PHENOL 1.4 % MT LIQD
1.0000 | OROMUCOSAL | Status: DC | PRN
  Filled 2016-12-07: qty 177

## 2016-12-07 MED ORDER — SODIUM CHLORIDE 0.9 % IV SOLN
INTRAVENOUS | Status: DC | PRN
  Administered 2016-12-07: 30 ug/min via INTRAVENOUS

## 2016-12-07 MED ORDER — ACETAMINOPHEN 325 MG PO TABS: 650.0000 mg | ORAL_TABLET | ORAL | Status: DC | PRN

## 2016-12-07 MED ORDER — ONDANSETRON HCL 4 MG/2ML IJ SOLN: 4.0000 mg | Freq: Four times a day (QID) | INTRAMUSCULAR | Status: DC | PRN

## 2016-12-07 MED ORDER — LACTATED RINGERS IV SOLN
INTRAVENOUS | Status: DC
  Administered 2016-12-07 (×3): via INTRAVENOUS

## 2016-12-07 MED ORDER — ONDANSETRON HCL 4 MG/2ML IJ SOLN
INTRAMUSCULAR | Status: DC | PRN
  Administered 2016-12-07: 4 mg via INTRAVENOUS

## 2016-12-07 MED ORDER — SENNOSIDES-DOCUSATE SODIUM 8.6-50 MG PO TABS
1.0000 | ORAL_TABLET | Freq: Two times a day (BID) | ORAL | Status: DC
Start: 2016-12-07 — End: 2016-12-09
  Administered 2016-12-07 – 2016-12-09 (×4): 1 via ORAL
  Filled 2016-12-07 (×4): qty 1

## 2016-12-07 MED ORDER — MAGNESIUM HYDROXIDE 400 MG/5ML PO SUSP
30.0000 mL | Freq: Every day | ORAL | Status: DC | PRN
  Administered 2016-12-08: 30 mL via ORAL
  Filled 2016-12-07: qty 30

## 2016-12-07 MED ORDER — PROPOFOL 10 MG/ML IV BOLUS
INTRAVENOUS | Status: DC | PRN
  Administered 2016-12-07: 12 mg via INTRAVENOUS
  Administered 2016-12-07: 100 mg via INTRAVENOUS

## 2016-12-07 MED ORDER — NEOMYCIN-POLYMYXIN B GU 40-200000 IR SOLN
Status: AC
  Filled 2016-12-07: qty 20

## 2016-12-07 MED ORDER — MIDAZOLAM HCL 5 MG/5ML IJ SOLN
INTRAMUSCULAR | Status: DC | PRN
  Administered 2016-12-07 (×4): .5 mg via INTRAVENOUS

## 2016-12-07 MED ORDER — FENTANYL CITRATE (PF) 100 MCG/2ML IJ SOLN
INTRAMUSCULAR | Status: AC
  Filled 2016-12-07: qty 2

## 2016-12-07 MED ORDER — ATORVASTATIN CALCIUM 20 MG PO TABS
40.0000 mg | ORAL_TABLET | Freq: Every day | ORAL | Status: DC
  Administered 2016-12-08 – 2016-12-09 (×2): 40 mg via ORAL
  Filled 2016-12-07 (×2): qty 2

## 2016-12-07 MED ORDER — BUPIVACAINE-EPINEPHRINE (PF) 0.25% -1:200000 IJ SOLN
INTRAMUSCULAR | Status: AC
  Filled 2016-12-07: qty 60

## 2016-12-07 MED ORDER — SODIUM CHLORIDE 0.9 % IV SOLN
INTRAVENOUS | Status: DC | PRN
  Administered 2016-12-07: 60 mL

## 2016-12-07 MED ORDER — CELECOXIB 200 MG PO CAPS
200.0000 mg | ORAL_CAPSULE | Freq: Two times a day (BID) | ORAL | Status: DC
  Administered 2016-12-07 – 2016-12-09 (×4): 200 mg via ORAL
  Filled 2016-12-07 (×4): qty 1

## 2016-12-07 MED ORDER — MENTHOL 3 MG MT LOZG
1.0000 | LOZENGE | OROMUCOSAL | Status: DC | PRN
  Filled 2016-12-07: qty 9

## 2016-12-07 MED ORDER — MORPHINE SULFATE (PF) 2 MG/ML IV SOLN: 2.0000 mg | INTRAVENOUS | Status: DC | PRN

## 2016-12-07 MED ORDER — FENTANYL CITRATE (PF) 100 MCG/2ML IJ SOLN
INTRAMUSCULAR | Status: AC
  Administered 2016-12-07: 25 ug via INTRAVENOUS
  Filled 2016-12-07: qty 2

## 2016-12-07 MED ORDER — CEFAZOLIN SODIUM-DEXTROSE 2-4 GM/100ML-% IV SOLN
INTRAVENOUS | Status: AC
  Filled 2016-12-07: qty 100

## 2016-12-07 MED ORDER — TETRACAINE HCL 1 % IJ SOLN
INTRAMUSCULAR | Status: DC | PRN
  Administered 2016-12-07: 5 mg via INTRASPINAL

## 2016-12-07 MED ORDER — ONDANSETRON HCL 4 MG/2ML IJ SOLN: 4.0000 mg | Freq: Once | INTRAMUSCULAR | Status: DC | PRN

## 2016-12-07 MED ORDER — OXYCODONE HCL 5 MG PO TABS
5.0000 mg | ORAL_TABLET | ORAL | Status: DC | PRN
  Administered 2016-12-07 – 2016-12-09 (×4): 5 mg via ORAL
  Filled 2016-12-07 (×4): qty 1

## 2016-12-07 MED ORDER — CHLORHEXIDINE GLUCONATE 4 % EX LIQD: 60.0000 mL | Freq: Once | CUTANEOUS | Status: DC

## 2016-12-07 MED ORDER — NEOMYCIN-POLYMYXIN B GU 40-200000 IR SOLN
Status: DC | PRN
  Administered 2016-12-07: 14 mL

## 2016-12-07 MED ORDER — CALCIUM CARBONATE-VITAMIN D 500-200 MG-UNIT PO TABS
1.0000 | ORAL_TABLET | Freq: Every day | ORAL | Status: DC
  Administered 2016-12-08 – 2016-12-09 (×2): 1 via ORAL
  Filled 2016-12-07 (×2): qty 1

## 2016-12-07 MED ORDER — SODIUM CHLORIDE 0.9 % IV SOLN
INTRAVENOUS | Status: DC | PRN
  Administered 2016-12-07: 1000 mg via INTRAVENOUS

## 2016-12-07 MED ORDER — BUPIVACAINE HCL (PF) 0.5 % IJ SOLN
INTRAMUSCULAR | Status: DC | PRN
Start: 2016-12-07 — End: 2016-12-07
  Administered 2016-12-07: 2.5 mL

## 2016-12-07 MED ORDER — LIDOCAINE HCL (PF) 2 % IJ SOLN
INTRAMUSCULAR | Status: AC
  Filled 2016-12-07: qty 10

## 2016-12-07 MED ORDER — ACETAMINOPHEN 650 MG RE SUPP: 650.0000 mg | RECTAL | Status: DC | PRN

## 2016-12-07 MED ORDER — PROPOFOL 500 MG/50ML IV EMUL
INTRAVENOUS | Status: AC
  Filled 2016-12-07: qty 50

## 2016-12-07 MED ORDER — SODIUM CHLORIDE 0.9 % IV SOLN
1000.0000 mg | Freq: Once | INTRAVENOUS | Status: DC
  Filled 2016-12-07: qty 10

## 2016-12-07 MED ORDER — ACETAMINOPHEN 10 MG/ML IV SOLN
1000.0000 mg | Freq: Four times a day (QID) | INTRAVENOUS | Status: AC
  Administered 2016-12-07 – 2016-12-08 (×4): 1000 mg via INTRAVENOUS
  Filled 2016-12-07 (×4): qty 100

## 2016-12-07 MED ORDER — BISACODYL 10 MG RE SUPP
10.0000 mg | Freq: Every day | RECTAL | Status: DC | PRN
  Administered 2016-12-09: 10 mg via RECTAL
  Filled 2016-12-07: qty 1

## 2016-12-07 MED ORDER — MINERAL OIL LIGHT 100 % EX OIL
TOPICAL_OIL | CUTANEOUS | Status: AC
  Filled 2016-12-07: qty 25

## 2016-12-07 MED ORDER — TOBRAMYCIN SULFATE 1.2 G IJ SOLR
INTRAMUSCULAR | Status: AC
  Filled 2016-12-07: qty 10.8

## 2016-12-07 MED ORDER — ALUM & MAG HYDROXIDE-SIMETH 200-200-20 MG/5ML PO SUSP: 30.0000 mL | ORAL | Status: DC | PRN

## 2016-12-07 MED ORDER — BUPIVACAINE HCL (PF) 0.25 % IJ SOLN
INTRAMUSCULAR | Status: AC
  Filled 2016-12-07: qty 60

## 2016-12-07 MED ORDER — MIDAZOLAM HCL 2 MG/2ML IJ SOLN
INTRAMUSCULAR | Status: AC
  Filled 2016-12-07: qty 2

## 2016-12-07 MED ORDER — CALCIUM-VITAMIN D 600-200 MG-UNIT PO TABS: 1.0000 | ORAL_TABLET | Freq: Every day | ORAL | Status: DC

## 2016-12-07 MED ORDER — ENOXAPARIN SODIUM 30 MG/0.3ML ~~LOC~~ SOLN
30.0000 mg | Freq: Two times a day (BID) | SUBCUTANEOUS | Status: DC
  Administered 2016-12-08 – 2016-12-09 (×3): 30 mg via SUBCUTANEOUS
  Filled 2016-12-07 (×3): qty 0.3

## 2016-12-07 MED ORDER — DIPHENHYDRAMINE HCL 12.5 MG/5ML PO ELIX: 12.5000 mg | ORAL_SOLUTION | ORAL | Status: DC | PRN

## 2016-12-07 MED ORDER — OXYCODONE HCL 5 MG PO TABS
10.0000 mg | ORAL_TABLET | ORAL | Status: DC | PRN
  Administered 2016-12-08: 10 mg via ORAL
  Filled 2016-12-07: qty 2

## 2016-12-07 SURGICAL SUPPLY — 89 items
ADAPTER BOLT FEMORAL +2/-2 (Knees) ×3 IMPLANT
AUGMENT FEM SZ2 4 LT DIST PFC (Knees) ×3 IMPLANT
BIT DRILL GUIDEWIRE 2.5X200 (WIRE) ×9 IMPLANT
BLADE OSCILLATING/SAGITTAL (BLADE) ×2
BLADE SAW 1 (BLADE) ×3 IMPLANT
BLADE SAW 1/2 (BLADE) ×3 IMPLANT
BLADE SW THK.38XMED LNG THN (BLADE) ×1 IMPLANT
BONE CEMENT GENTAMICIN (Cement) ×6 IMPLANT
CANISTER SUCT 1200ML W/VALVE (MISCELLANEOUS) ×3 IMPLANT
CANISTER SUCT 3000ML PPV (MISCELLANEOUS) ×6 IMPLANT
CATH TRAY METER 16FR LF (MISCELLANEOUS) ×3 IMPLANT
CEMENT BONE GENTAMICIN 40 (Cement) ×2 IMPLANT
CNTNR SPEC 2.5X3XGRAD LEK (MISCELLANEOUS) ×3
CONT SPEC 4OZ STER OR WHT (MISCELLANEOUS) ×6
CONTAINER SPEC 2.5X3XGRAD LEK (MISCELLANEOUS) ×3 IMPLANT
COOLER POLAR GLACIER W/PUMP (MISCELLANEOUS) ×3 IMPLANT
CUFF TOURN 24 STER (MISCELLANEOUS) ×3 IMPLANT
CUFF TOURN 30 STER DUAL PORT (MISCELLANEOUS) IMPLANT
DRAPE C-ARM XRAY 36X54 (DRAPES) ×6 IMPLANT
DRAPE SHEET LG 3/4 BI-LAMINATE (DRAPES) ×9 IMPLANT
DRAPE TABLE BACK 80X90 (DRAPES) ×3 IMPLANT
DRSG DERMACEA 8X12 NADH (GAUZE/BANDAGES/DRESSINGS) ×3 IMPLANT
DRSG OPSITE POSTOP 4X14 (GAUZE/BANDAGES/DRESSINGS) ×3 IMPLANT
DURAPREP 26ML APPLICATOR (WOUND CARE) ×3 IMPLANT
ELECT CAUTERY BLADE 6.4 (BLADE) ×3 IMPLANT
ELECT REM PT RETURN 9FT ADLT (ELECTROSURGICAL) ×3
ELECTRODE REM PT RTRN 9FT ADLT (ELECTROSURGICAL) ×1 IMPLANT
EVACUATOR 1/8 PVC DRAIN (DRAIN) ×3 IMPLANT
FEM TC3 PFC SZ2 LEFT (Orthopedic Implant) ×3 IMPLANT
FEMORAL ADAPTER (Orthopedic Implant) ×3 IMPLANT
FEMORAL TC3 PFC SZ2 LEFT (Orthopedic Implant) ×1 IMPLANT
GAUZE SPONGE 4X4 12PLY STRL (GAUZE/BANDAGES/DRESSINGS) ×3 IMPLANT
GLOVE BIOGEL M STRL SZ7.5 (GLOVE) ×3 IMPLANT
GLOVE BIOGEL PI IND STRL 7.0 (GLOVE) ×3 IMPLANT
GLOVE BIOGEL PI IND STRL 7.5 (GLOVE) ×3 IMPLANT
GLOVE BIOGEL PI IND STRL 9 (GLOVE) ×1 IMPLANT
GLOVE BIOGEL PI INDICATOR 7.0 (GLOVE) ×6
GLOVE BIOGEL PI INDICATOR 7.5 (GLOVE) ×6
GLOVE BIOGEL PI INDICATOR 9 (GLOVE) ×2
GLOVE INDICATOR 8.0 STRL GRN (GLOVE) ×3 IMPLANT
GLOVE SURG SYN 9.0  PF PI (GLOVE) ×2
GLOVE SURG SYN 9.0 PF PI (GLOVE) ×1 IMPLANT
GOWN STRL REUS W/ TWL LRG LVL3 (GOWN DISPOSABLE) ×2 IMPLANT
GOWN STRL REUS W/TWL 2XL LVL3 (GOWN DISPOSABLE) ×3 IMPLANT
GOWN STRL REUS W/TWL LRG LVL3 (GOWN DISPOSABLE) ×4
HANDPIECE VERSAJET DEBRIDEMENT (MISCELLANEOUS) IMPLANT
HOLDER FOLEY CATH W/STRAP (MISCELLANEOUS) ×3 IMPLANT
HOOD PEEL AWAY FLYTE STAYCOOL (MISCELLANEOUS) ×6 IMPLANT
INSERT PFC SIG STB SZ2 15.0MM (Knees) ×3 IMPLANT
IRRIGATION STRYKERFLOW (MISCELLANEOUS) IMPLANT
IRRIGATOR STRYKERFLOW (MISCELLANEOUS)
KIT RM TURNOVER STRD PROC AR (KITS) ×3 IMPLANT
KNIFE SCULPS 14X20 (INSTRUMENTS) ×3 IMPLANT
NDL SAFETY 18GX1.5 (NEEDLE) ×3 IMPLANT
NEEDLE SPNL 18GX3.5 QUINCKE PK (NEEDLE) ×3 IMPLANT
NEEDLE SPNL 20GX3.5 QUINCKE YW (NEEDLE) ×6 IMPLANT
NS IRRIG 1000ML POUR BTL (IV SOLUTION) ×3 IMPLANT
PACK TOTAL KNEE (MISCELLANEOUS) ×3 IMPLANT
PAD ABD DERMACEA PRESS 5X9 (GAUZE/BANDAGES/DRESSINGS) ×3 IMPLANT
PAD WRAPON POLAR KNEE (MISCELLANEOUS) ×1 IMPLANT
PULSAVAC PLUS IRRIG FAN TIP (DISPOSABLE) ×3
SCREW LOCKING 26MM (Screw) ×3 IMPLANT
SCREW LOCKING 4.0M 14M (Screw) ×9 IMPLANT
SCREW LOCKING 4.0M 16M (Screw) ×6 IMPLANT
SCREW LOCKING 4.0M 20M (Screw) ×6 IMPLANT
SLEEVE FEM UNIV DIST PRO SZ 20 (Sleeve) ×3 IMPLANT
SOL .9 NS 3000ML IRR  AL (IV SOLUTION) ×2
SOL .9 NS 3000ML IRR UROMATIC (IV SOLUTION) ×1 IMPLANT
SPONGE LAP 18X18 5 PK (GAUZE/BANDAGES/DRESSINGS) ×3 IMPLANT
STAPLER SKIN PROX 35W (STAPLE) ×3 IMPLANT
STEM UNIVERSAL REVISION 75X14 (Stem) ×3 IMPLANT
SUCTION FRAZIER HANDLE 10FR (MISCELLANEOUS) ×2
SUCTION TUBE FRAZIER 10FR DISP (MISCELLANEOUS) ×1 IMPLANT
SUT BONE WAX W31G (SUTURE) ×3 IMPLANT
SUT MNCRL 0 1X36 CT-1 (SUTURE) ×1 IMPLANT
SUT MON AB 2-0 CT1 36 (SUTURE) ×3 IMPLANT
SUT MONOCRYL 0 (SUTURE) ×2
SUT PROLENE 1 CT 1 30 (SUTURE) ×3 IMPLANT
SUT VIC AB 0 CT1 36 (SUTURE) ×3 IMPLANT
SUT VIC AB 1 CT1 36 (SUTURE) ×6 IMPLANT
SUT VIC AB 2-0 CT1 (SUTURE) ×3 IMPLANT
SWAB CULTURE AMIES ANAERIB BLU (MISCELLANEOUS) ×6 IMPLANT
SYR 20CC LL (SYRINGE) ×3 IMPLANT
SYR 30ML LL (SYRINGE) ×6 IMPLANT
TIP COAXIAL FEMORAL CANAL (MISCELLANEOUS) ×3 IMPLANT
TIP FAN IRRIG PULSAVAC PLUS (DISPOSABLE) ×1 IMPLANT
TOWEL OR 17X26 4PK STRL BLUE (TOWEL DISPOSABLE) ×3 IMPLANT
TOWER CARTRIDGE SMART MIX (DISPOSABLE) ×3 IMPLANT
WRAPON POLAR PAD KNEE (MISCELLANEOUS) ×3

## 2016-12-07 NOTE — Transfer of Care (Signed)
Immediate Anesthesia Transfer of Care Note  Patient: Catherine Burgess  Procedure(s) Performed: TOTAL KNEE REVISION (Left Knee)  Patient Location: PACU  Anesthesia Type:MAC and Spinal  Level of Consciousness: awake, alert  and oriented  Airway & Oxygen Therapy: Patient Spontanous Breathing and Patient connected to face mask oxygen  Post-op Assessment: Report given to RN  Post vital signs: Reviewed and stable  Last Vitals:  Vitals:   12/07/16 1004 12/07/16 2017  BP:  119/68  Pulse: 77 74  Resp: 18 15  Temp: 36.7 C (!) 36.4 C  SpO2: 100% 98%    Last Pain:  Vitals:   12/07/16 1004  TempSrc: Oral  PainSc: 7          Complications: No apparent anesthesia complications

## 2016-12-07 NOTE — Anesthesia Procedure Notes (Signed)
Spinal  Patient location during procedure: OR Start time: 12/07/2016 12:32 PM End time: 12/07/2016 12:43 PM Staffing Performed: anesthesiologist  Preanesthetic Checklist Completed: patient identified, site marked, surgical consent, pre-op evaluation, timeout performed, IV checked, risks and benefits discussed and monitors and equipment checked Spinal Block Patient position: sitting Prep: ChloraPrep Patient monitoring: heart rate, continuous pulse ox, blood pressure and cardiac monitor Approach: right paramedian Location: L4-5 Injection technique: single-shot Needle Needle type: Introducer and Pencan  Needle gauge: 24 G Needle length: 9 cm Additional Notes Negative paresthesia. Negative blood return. Positive free-flowing CSF. Expiration date of kit checked and confirmed. Patient tolerated procedure well, without complications.

## 2016-12-07 NOTE — Progress Notes (Signed)
Pt received from PACU alert and oriented. PACU nurse reports that she just finished up last dose of Tranexamic acid.  Pt alert and oriented, pain level at a 5 and medicated for pain. No nausea or vomiting at this time.

## 2016-12-07 NOTE — Op Note (Signed)
OPERATIVE NOTE  DATE OF SURGERY:  12/07/2016  PATIENT NAME:  Catherine Burgess   DOB: 1935/05/24  MRN: [id number]   PRE-OPERATIVE DIAGNOSIS: Loose femoral implant status post left total knee arthroplasty  POST-OPERATIVE DIAGNOSIS:  Loose femoral implant status post left total knee arthroplasty Left distal lateral femoral condyle fracture  PROCEDURE: Left knee revision arthroplasty (femoral component) Open reduction and internal fixation of the left distal lateral femoral condyle fracture  SURGEON:  Marciano Sequin., M.D.   ASSISTANT: Vance Peper, PA  ANESTHESIA: spinal and general  ESTIMATED BLOOD LOSS: 100 mL  FLUIDS REPLACED: 4200 mL of crystalloid  TOURNIQUET TIME: 1 -  120 minutes 2 - 50 minutes  DRAINS: 2 medium Hemovac drains  IMPLANTS UTILIZED: Synthes 6-hole distal femoral condylar locking plate, 8 - 4.0 mm locking screws; Depuy size 2 TC3 femoral implant (cemented), 5 degree femoral adapter, 4 mm distal medial augment, 20 mm femoral sleeve,  14 mm x 75 mm fluted stem, 15 mm rotating platform polyethylene insert  INDICATIONS FOR SURGERY: Catherine Burgess is a 81 y.o. year old female who has been seen for complaints of left knee pain.  She underwent left total knee arthroplasty approximately 13 years ago.  Radiographs and bone scan demonstrated findings consistent with loosening of the implant.  The femoral implant was grossly loose and was removed with out difficulty.  Swabs were obtained from the undersurface of the implant.  The bone in the distal femur was noted to be extremely soft with soft tissue replacement.  The fibrotic tissue was removed using combination of curettes and rongeurs.  A pilot hole for reaming of the distal femoral canal was created implant.. After discussion of the risks and benefits of surgical intervention, the patient expressed understanding of the risks benefits and agree with plans for revision of the left total knee arthroplasty.    PROCEDURE IN DETAIL: The patient was brought into the operating room and, after adequate spinal and general anesthesia were achieved, the sternum was placed on patient's upper left thigh.  Patient's left knee and leg were cleaned and prepped with alcohol and DuraPrep and draped in usual sterile fashion.  A "timeout" was performed as per usual protocol.  The left lower extremity was exsanguinated using an Esmarch, and the tourniquet was inflated to 300 mmHg.  An anterior longitudinal incision was made.  An attempt was made to aspirate the left knee and a scant amount of clear synovial fluid was obtained and submitted for stat Gram stain, culture, and sensitivity.  A medial parapatellar incision was made.  The deep fibers of the medial collateral ligament elevated in subperiosteal fashion off the medial flare of the tibia so as to maintain a continuous soft tissue sleeve.  Abundant fibrotic scar tissue was encountered in the suprapatellar region as well as along both the medial and lateral gutters.  The scar tissue was excised using electrocautery so as to reestablish the medial and lateral gutters.  The extensor mechanism was extremely tight and it was difficult to sublux the patella.  A quadriceps snip was performed so as to better mobilize the patella.  Inspection of the patella demonstrated both bony and soft tissue overgrowth.  The bony overgrowth was removed using rongeurs.  The previous polyethylene insert was removed.  Debridement of the scar tissue was continued along the posterior recess.  Curettes were used around the periphery of the femoral component which was noted to be grossly loose.  The femoral component was removed without  difficulty.  The tissue beneath the femoral component was soft and fibrotic and was subsequently debrided using curettes and rongeurs.  A pilot hole for reaming of the femoral canal was performed and the canal was reamed in sequential fashion up to a 14 mm diameter.  With the  reamer in place, the distal femoral cutting guide was placed over the shaft and cleanup cuts were performed allowing for a 4 mm buildup to the distal medial femoral condyle and neutral cut laterally.  The subsequent cutting guide was placed for cutting of the posterior condyles and chamfers.  The distal femur had been reamed so as to accommodate a 20 mm femoral sleeve.  While preparing to trial the new implant construct, a fracture line was noted along the distal lateral femoral condyle.  Tourniquet was deflated while addressing the fracture.  A Synthes 6-hole distal femoral condylar plate was positioned laterally and the fracture was stabilized with a combination of eight 4.0 mm locking screws.  Attention was then redirected to the femoral revision.  A size 2 TC3 femoral component with a 20 mm femoral sleeve and a 14 mm x 75 mm fluted stem were positioned with the trial implants and a 15 mm polyethylene insert was placed.  This allowed for good medial lateral soft tissue balancing and good alignment.  The left lower extremity was exsanguinated and the second time and the tourniquet was inflated to 300 mmHg.  The cut surfaces of bone were irrigated with copious amounts of normal saline with solution using pulsatile lavage and suctioned dry.  A size 2 TC 3 femoral implant with a 5 degree femoral adapter, a 20 mm femoral sleeve, and a 14 mm x 25 mm fluted stem was constructed on the back table.  The polymethylmethacrylate cement with gentamicin was prepared in the usual fashion using a vacuum mixer.  Cement was applied to the cut surface of the femur as well as on the distal and posterior flanges of the implants.  The femoral component was positioned and impacted into place.  Excess cement was removed using [employer] fast streamer.  A 15 mm polyethylene trial was inserted and then he was brought into full extension with steady actual compression applied.  After adequate curing of cement, the tourniquet was deflated.  Good  hemostasis was noted.20 mL of 1.3% Exparel and 60 mL of 0.25% Marcaine in 40 mL of normal saline was injected along the posterior capsule, medial and lateral gutters, and along the arthrotomy site. A 15 mm stabilized rotating platform polyethylene insert was inserted and the knee was placed through a range of motion with excellent mediolateral soft tissue balancing appreciated and excellent patellar tracking noted. 2 medium drains were placed in the wound bed and brought out through separate stab incisions. The medial parapatellar portion of the incision was reapproximated using interrupted sutures of #1 Vicryl. Subcutaneous tissue was approximated in layers using first #0 Vicryl followed #2-0 Vicryl. The skin was approximated with skin staples. A sterile dressing was applied.  The patient tolerated the procedure well.  She was transported to the recovery room in stable condition.  James P. Holley Bouche M.D.

## 2016-12-07 NOTE — OR Nursing (Signed)
Explanted femoral component, tibial insert

## 2016-12-07 NOTE — Brief Op Note (Signed)
12/07/2016  8:18 PM  PATIENT:  Catherine Burgess  81 y.o. female  PRE-OPERATIVE DIAGNOSIS: Loose femoral component status post left total knee arthroplasty  POST-OPERATIVE DIAGNOSIS: Same Fracture of the distal lateral femoral condyle  PROCEDURE:  Procedure(s): TOTAL KNEE REVISION (Left)(femoral component) Open reduction internal fixation of the left distal lateral femoral condyle fracture  SURGEON:  Surgeon(s) and Role:    * Hooten, Laurice Record, MD - Primary  ASSISTANTS: Vance Peper, PA  ANESTHESIA:   spinal and general  EBL:  100 mL   BLOOD ADMINISTERED:none  DRAINS: 2 medium Hemovac   LOCAL MEDICATIONS USED:  OTHER Exparel  SPECIMEN:  Source of Specimen:  Multiple swabs and fluid from the left knee for cultures  DISPOSITION OF SPECIMEN:  PATHOLOGY  COUNTS:  YES  TOURNIQUET:   Total Tourniquet Time Documented: Thigh (Left) - 131 minutes Thigh (Left) - 34 minutes Total: Thigh (Left) - 165 minutes   DICTATION: .Viviann Spare Dictation  PLAN OF CARE: Admit to inpatient   PATIENT DISPOSITION:  PACU - hemodynamically stable.   Delay start of Pharmacological VTE agent (>24hrs) due to surgical blood loss or risk of bleeding: yes

## 2016-12-07 NOTE — Anesthesia Post-op Follow-up Note (Signed)
Anesthesia QCDR form completed.        

## 2016-12-07 NOTE — Anesthesia Procedure Notes (Signed)
Procedure Name: LMA Insertion Performed by: Rolla Plate Pre-anesthesia Checklist: Patient identified, Patient being monitored, Timeout performed, Emergency Drugs available and Suction available Patient Re-evaluated:Patient Re-evaluated prior to induction Oxygen Delivery Method: Circle system utilized Preoxygenation: Pre-oxygenation with 100% oxygen Induction Type: IV induction Ventilation: Mask ventilation without difficulty LMA: LMA inserted LMA Size: 4.0 Tube type: Oral Number of attempts: 1 Placement Confirmation: positive ETCO2 and breath sounds checked- equal and bilateral Tube secured with: Tape Dental Injury: Teeth and Oropharynx as per pre-operative assessment

## 2016-12-07 NOTE — H&P (Signed)
The patient has been re-examined, and the chart reviewed, and there have been no interval changes to the documented history and physical.    The risks, benefits, and alternatives have been discussed at length. The patient expressed understanding of the risks benefits and agreed with plans for surgical intervention.  James P. Hooten, Jr. M.D.    

## 2016-12-07 NOTE — Anesthesia Preprocedure Evaluation (Signed)
Anesthesia Evaluation  Patient identified by MRN, date of birth, ID band Patient awake    Reviewed: Allergy & Precautions, NPO status , Patient's Chart, lab work & pertinent test results  History of Anesthesia Complications Negative for: history of anesthetic complications  Airway Mallampati: II       Dental  (+) Upper Dentures, Lower Dentures   Pulmonary neg sleep apnea, neg COPD, former smoker,           Cardiovascular hypertension, Pt. on medications (-) Past MI and (-) CHF (-) dysrhythmias (-) Valvular Problems/Murmurs     Neuro/Psych neg Seizures    GI/Hepatic Neg liver ROS, GERD  Medicated and Controlled,  Endo/Other  neg diabetes  Renal/GU negative Renal ROS     Musculoskeletal   Abdominal   Peds  Hematology   Anesthesia Other Findings   Reproductive/Obstetrics                            Anesthesia Physical Anesthesia Plan  ASA: II  Anesthesia Plan: Spinal   Post-op Pain Management:    Induction:   PONV Risk Score and Plan:   Airway Management Planned:   Additional Equipment:   Intra-op Plan:   Post-operative Plan:   Informed Consent: I have reviewed the patients History and Physical, chart, labs and discussed the procedure including the risks, benefits and alternatives for the proposed anesthesia with the patient or authorized representative who has indicated his/her understanding and acceptance.     Plan Discussed with:   Anesthesia Plan Comments:         Anesthesia Quick Evaluation

## 2016-12-08 ENCOUNTER — Encounter: Payer: Self-pay | Admitting: Orthopedic Surgery

## 2016-12-08 LAB — CBC
HCT: 35.3 % (ref 35.0–47.0)
Hemoglobin: 11.3 g/dL — ABNORMAL LOW (ref 12.0–16.0)
MCH: 26.3 pg (ref 26.0–34.0)
MCHC: 32 g/dL (ref 32.0–36.0)
MCV: 82.1 fL (ref 80.0–100.0)
PLATELETS: 271 10*3/uL (ref 150–440)
RBC: 4.3 MIL/uL (ref 3.80–5.20)
RDW: 17.3 % — ABNORMAL HIGH (ref 11.5–14.5)
WBC: 10 10*3/uL (ref 3.6–11.0)

## 2016-12-08 LAB — BASIC METABOLIC PANEL
ANION GAP: 6 (ref 5–15)
BUN: 16 mg/dL (ref 6–20)
CO2: 27 mmol/L (ref 22–32)
Calcium: 8.4 mg/dL — ABNORMAL LOW (ref 8.9–10.3)
Chloride: 105 mmol/L (ref 101–111)
Creatinine, Ser: 0.98 mg/dL (ref 0.44–1.00)
GFR, EST NON AFRICAN AMERICAN: 54 mL/min — AB (ref >60)
Glucose, Bld: 150 mg/dL — ABNORMAL HIGH (ref 65–99)
POTASSIUM: 3.3 mmol/L — AB (ref 3.5–5.1)
SODIUM: 138 mmol/L (ref 135–145)

## 2016-12-08 MED ORDER — TRAMADOL HCL 50 MG PO TABS: 50.0000 mg | ORAL_TABLET | ORAL | 0 refills | Status: DC | PRN

## 2016-12-08 MED ORDER — DEXTROSE 5 % IV SOLN
2.0000 g | Freq: Four times a day (QID) | INTRAVENOUS | Status: AC
  Administered 2016-12-08 (×2): 2 g via INTRAVENOUS
  Filled 2016-12-08 (×2): qty 2000

## 2016-12-08 MED ORDER — POTASSIUM CHLORIDE CRYS ER 20 MEQ PO TBCR
20.0000 meq | EXTENDED_RELEASE_TABLET | Freq: Three times a day (TID) | ORAL | Status: AC
  Administered 2016-12-08 (×3): 20 meq via ORAL
  Filled 2016-12-08 (×3): qty 1

## 2016-12-08 MED ORDER — OXYCODONE HCL 5 MG PO TABS: 5.0000 mg | ORAL_TABLET | ORAL | 0 refills | Status: DC | PRN

## 2016-12-08 MED ORDER — ENOXAPARIN SODIUM 40 MG/0.4ML ~~LOC~~ SOLN: 40.0000 mg | SUBCUTANEOUS | Status: DC

## 2016-12-08 NOTE — NC FL2 (Signed)
[place] LEVEL OF CARE SCREENING TOOL     IDENTIFICATION  Patient Name: Catherine Burgess Birthdate: [birth year] Sex: female Admission Date (Current Location): 12/07/2016  [place] and [place] Number:  Engineering geologist and Address:  [facility], [address], [place], [place] [address]      Provider Number: [id number]  Attending Physician Name and Address:  Dereck Leep, MD  Relative Name and Phone Number:       Current Level of Care: Hospital Recommended Level of Care: [place] Prior Approval Number:    Date Approved/Denied:   PASRR Number:  [id number])  Discharge Plan: SNF    Current Diagnoses: Patient Active Problem List   Diagnosis Date Noted  . S/P total knee arthroplasty 12/07/2016  . History of anemia 10/13/2016  . Syncope 07/23/2016  . Malignant essential hypertension 07/23/2016  . Hypokalemia 07/23/2016  . Abnormal CT of brain 07/23/2016  . Vitamin D deficiency 10/28/2015  . Post menopausal syndrome 10/28/2015  . OP (osteoporosis) 10/28/2015  . Cervical disc disease 10/28/2015  . H/O total knee replacement 01/20/2015  . Vaginal atrophy 09/26/2014  . Osteoarthritis of both knees 07/31/2014  . GERD without esophagitis 07/31/2014  . HLD (hyperlipidemia) 07/31/2014  . Hypertension, benign 07/31/2014  . History of artificial joint 05/18/2013    Orientation RESPIRATION BLADDER Height & Weight     Self, Time, Situation, Place  Normal Continent Weight: 144 lb (65.3 kg) Height:  5' 3.5" (161.3 cm)  BEHAVIORAL SYMPTOMS/MOOD NEUROLOGICAL BOWEL NUTRITION STATUS      Continent Diet (Regular)  AMBULATORY STATUS COMMUNICATION OF NEEDS Skin   Extensive Assist Verbally Surgical wounds (Incision Left Knee)                       Personal Care Assistance Level of Assistance  Bathing, Feeding, Dressing Bathing Assistance: Limited assistance Feeding assistance: Independent Dressing Assistance:  Limited assistance     Functional Limitations Info  Sight, Hearing, Speech Sight Info: Adequate Hearing Info: Adequate Speech Info: Adequate    SPECIAL CARE FACTORS FREQUENCY  PT (By licensed PT), OT (By licensed OT)     PT Frequency:  (5) OT Frequency:  (5)            Contractures      Additional Factors Info  Code Status, Allergies Code Status Info:  (Full Code) Allergies Info:  (LOVASTATIN, PRAVASTATIN, ROSUVASTATIN, STATINS )           Current Medications (12/08/2016):  This is the current hospital active medication list Current Facility-Administered Medications  Medication Dose Route Frequency Provider Last Rate Last Dose  . 0.9 %  sodium chloride infusion   Intravenous Continuous Avyukt Cimo, Laurice Record, MD 100 mL/hr at 12/07/16 2247    . acetaminophen (OFIRMEV) IV 1,000 mg  1,000 mg Intravenous Q6H Javar Eshbach, Laurice Record, MD   Stopped at 12/08/16 0522  . acetaminophen (TYLENOL) tablet 650 mg  650 mg Oral Q4H PRN Neelah Mannings, Laurice Record, MD       Or  . acetaminophen (TYLENOL) suppository 650 mg  650 mg Rectal Q4H PRN Anjelika Ausburn, Laurice Record, MD      . alum & mag hydroxide-simeth (MAALOX/MYLANTA) 200-200-20 MG/5ML suspension 30 mL  30 mL Oral Q4H PRN Lilyan Prete, Laurice Record, MD      . amLODipine (NORVASC) tablet 5 mg  5 mg Oral Daily Boss Danielsen, Laurice Record, MD      . atorvastatin (LIPITOR) tablet 40 mg  40  mg Oral Daily Yanil Dawe, Laurice Record, MD   40 mg at 12/08/16 0800  . bisacodyl (DULCOLAX) suppository 10 mg  10 mg Rectal Daily PRN Julane Crock, Laurice Record, MD      . calcium-vitamin D (OSCAL WITH D) 500-200 MG-UNIT per tablet 1 tablet  1 tablet Oral Daily Akela Pocius, Laurice Record, MD   1 tablet at 12/08/16 0800  . ceFAZolin (ANCEF) 2 g in dextrose 5 % 100 mL IVPB  2 g Intravenous Q6H Delio Slates, Laurice Record, MD      . celecoxib (CELEBREX) capsule 200 mg  200 mg Oral Q12H Itati Brocksmith, Laurice Record, MD   200 mg at 12/08/16 0800  . diphenhydrAMINE (BENADRYL) 12.5 MG/5ML elixir 12.5-25 mg  12.5-25 mg Oral Q4H PRN Yatzari Jonsson, Laurice Record, MD      . enoxaparin  (LOVENOX) injection 30 mg  30 mg Subcutaneous Q12H Braeden Dolinski, Laurice Record, MD   30 mg at 12/08/16 0759  . [START ON 12/10/2016] enoxaparin (LOVENOX) injection 40 mg  40 mg Subcutaneous Q24H Vance Peper R, PA      . ferrous sulfate tablet 325 mg  325 mg Oral BID WC Dereck Leep, MD   325 mg at 12/08/16 0759  . gabapentin (NEURONTIN) capsule 300 mg  300 mg Oral BID Dereck Leep, MD   300 mg at 12/07/16 2242  . losartan (COZAAR) tablet 50 mg  50 mg Oral Daily Idalis Hoelting, Laurice Record, MD      . magnesium hydroxide (MILK OF MAGNESIA) suspension 30 mL  30 mL Oral Daily PRN Dulcey Riederer, Laurice Record, MD      . menthol-cetylpyridinium (CEPACOL) lozenge 3 mg  1 lozenge Oral PRN Elektra Wartman, Laurice Record, MD       Or  . phenol (CHLORASEPTIC) mouth spray 1 spray  1 spray Mouth/Throat PRN Jaeliana Lococo, Laurice Record, MD      . metoCLOPramide (REGLAN) tablet 10 mg  10 mg Oral TID AC & HS Ladarrian Asencio, Laurice Record, MD   10 mg at 12/08/16 0759  . morphine 2 MG/ML injection 2 mg  2 mg Intravenous Q2H PRN Aurie Harroun, Laurice Record, MD      . ondansetron (ZOFRAN) tablet 4 mg  4 mg Oral Q6H PRN Shawnte Winton, Laurice Record, MD       Or  . ondansetron (ZOFRAN) injection 4 mg  4 mg Intravenous Q6H PRN Rhylee Nunn, Laurice Record, MD      . oxyCODONE (Oxy IR/ROXICODONE) immediate release tablet 10 mg  10 mg Oral Q3H PRN Dereck Leep, MD   10 mg at 12/08/16 0151  . oxyCODONE (Oxy IR/ROXICODONE) immediate release tablet 5 mg  5 mg Oral Q3H PRN Dereck Leep, MD   5 mg at 12/08/16 0527  . pantoprazole (PROTONIX) EC tablet 40 mg  40 mg Oral BID Dereck Leep, MD   40 mg at 12/08/16 0801  . potassium chloride SA (K-DUR,KLOR-CON) CR tablet 20 mEq  20 mEq Oral TID Dereck Leep, MD   20 mEq at 12/08/16 0801  . senna-docusate (Senokot-S) tablet 1 tablet  1 tablet Oral BID Dereck Leep, MD   1 tablet at 12/08/16 0801  . sodium phosphate (FLEET) 7-19 GM/118ML enema 1 enema  1 enema Rectal Once PRN Kostas Marrow, Laurice Record, MD      . traMADol Veatrice Bourbon) tablet 50-100 mg  50-100 mg Oral Q4H PRN Marleah Beever, Laurice Record, MD       . tranexamic acid (CYKLOKAPRON) 1,000 mg in sodium chloride 0.9 % 100 mL IVPB  1,000 mg Intravenous Once Ceil Roderick, Laurice Record, MD         Discharge Medications: Please see discharge summary for a list of discharge medications.  Relevant Imaging Results:  Relevant Lab Results:   Additional Information  (SSN: [id number])  Smith Mince, Student-Social Work

## 2016-12-08 NOTE — Clinical Social Work Note (Signed)
Clinical Social Work Assessment  Patient Details  Name: Catherine Burgess MRN: [id number] Date of Birth: 1936/01/10  Date of referral:  12/08/16               Reason for consult:  Facility Placement, Discharge Planning                Permission sought to share information with:  Chartered certified accountant granted to share information::  Yes, Verbal Permission Granted  Name::      [place]::   [place]  Relationship::     Contact Information:     Housing/Transportation Living arrangements for the past 2 months:  [place] of Information:  Patient, Adult Children, Power of Attorney Patient Interpreter Needed:  None Criminal Activity/Legal Involvement Pertinent to Current Situation/Hospitalization:  No - Comment as needed Significant Relationships:  Adult Children, Other Family Members Lives with:  Self Do you feel safe going back to the place where you live?  Yes Need for family participation in patient care:  Yes (Comment)  Care giving concerns: Patient lives alone in [place].   Social Worker assessment / plan: [place] representative (CSW) received SNF consult. PT is recommending SNF. Social work Theatre manager met with patient, her son/HPOA [name] [phone]), her cousin Fraser Din at bedside. Patient was sitting up at bedside and was alert and oriented x4. Social work Theatre manager introduced self and explained the role of the [place]. Patient states she lives alone in [place]. Social work Theatre manager explained that PT is recommending SNF and the SNF process. Patient verbalized her understanding and is agreeable to SNF search. Patient states that she prefers [facility]. FL2 completed and faxed out. CSW and social work Theatre manager will continue to follow up and assist.    Employment status:  Retired Nurse, adult PT Recommendations:  Post / Referral to community resources:  [place]  Patient/Family's Response to care: Patient is agreeable to SNF search and prefers [facility].  Patient/Family's Understanding of and Emotional Response to Diagnosis, Current Treatment, and Prognosis:  Patient and her family members were pleasant and thanked social work Theatre manager for her assistance.  Emotional Assessment Appearance:  Appears stated age Attitude/Demeanor/Rapport:    Affect (typically observed):  Accepting, Pleasant, Calm Orientation:  Oriented to Self, Oriented to Place, Oriented to  Time, Oriented to Situation Alcohol / Substance use:  Not Applicable Psych involvement (Current and /or in the community):  No (Comment)  Discharge Needs  Concerns to be addressed:  Care Coordination, Discharge Planning Concerns Readmission within the last 30 days:  No Current discharge risk:  Dependent with Mobility Barriers to Discharge:  Continued Medical Work up   Smith Mince, Student-Social Work 12/08/2016, 11:45 AM

## 2016-12-08 NOTE — Discharge Summary (Signed)
Physician Discharge Summary  Patient ID: Catherine Burgess MRN: [id number] DOB/AGE: 1935-08-30 81 y.o.  Admit date: 12/07/2016 Discharge date: 12/09/2016  Admission Diagnoses:  STATUS POST LEFT TOTAL KNEE REPLACEMENT   Discharge Diagnoses: Patient Active Problem List   Diagnosis Date Noted  . S/P total knee arthroplasty 12/07/2016  . History of anemia 10/13/2016  . Syncope 07/23/2016  . Malignant essential hypertension 07/23/2016  . Hypokalemia 07/23/2016  . Abnormal CT of brain 07/23/2016  . Vitamin D deficiency 10/28/2015  . Post menopausal syndrome 10/28/2015  . OP (osteoporosis) 10/28/2015  . Cervical disc disease 10/28/2015  . H/O total knee replacement 01/20/2015  . Vaginal atrophy 09/26/2014  . Osteoarthritis of both knees 07/31/2014  . GERD without esophagitis 07/31/2014  . HLD (hyperlipidemia) 07/31/2014  . Hypertension, benign 07/31/2014  . History of artificial joint 05/18/2013    Past Medical History:  Diagnosis Date  . Degenerative joint disease   . Hyperlipidemia   . Hypertension   . Osteoarthrosis   . Reflux esophagitis      Transfusion: none   Consultants (if any):   Discharged Condition: Improved  Hospital Course: Catherine Burgess is an 81 y.o. female who was admitted 12/07/2016 with a diagnosis of left knee revision  and went to the operating room on 12/07/2016 and underwent the above named procedures.    Surgeries:Procedure(s): TOTAL KNEE REVISION on 12/07/2016  PRE-OPERATIVE DIAGNOSIS: Loose femoral implant status post left total knee arthroplasty  POST-OPERATIVE DIAGNOSIS:  Loose femoral implant status post left total knee arthroplasty Left distal lateral femoral condyle fracture  PROCEDURE: Left knee revision arthroplasty (femoral component) Open reduction and internal fixation of the left distal lateral femoral condyle fracture  SURGEON:  Marciano Sequin., M.D.          ASSISTANT: Vance Peper, PA  ANESTHESIA: spinal  and general  ESTIMATED BLOOD LOSS: 100 mL  FLUIDS REPLACED: 4200 mL of crystalloid  TOURNIQUET TIME: 1 -  120 minutes          2 - 50 minutes  DRAINS: 2 medium Hemovac drains  IMPLANTS UTILIZED: Synthes 6-hole distal femoral condylar locking plate, 8 - 4.0 mm locking screws; Depuy size 2 TC3 femoral implant (cemented), 5 degree femoral adapter, 4 mm distal medial augment, 20 mm femoral sleeve,  14 mm x 75 mm fluted stem, 15 mm rotating platform polyethylene insert  INDICATIONS FOR SURGERY: Catherine Burgess is a 81 y.o. year old female who has been seen for complaints of left knee pain.  She underwent left total knee arthroplasty approximately 13 years ago.  Radiographs and bone scan demonstrated findings consistent with loosening of the implant.  The femoral implant was grossly loose and was removed with out difficulty.  Swabs were obtained from the undersurface of the implant.  The bone in the distal femur was noted to be extremely soft with soft tissue replacement.  The fibrotic tissue was removed using combination of curettes and rongeurs.  A pilot hole for reaming of the distal femoral canal was created implant.. After discussion of the risks and benefits of surgical intervention, the patient expressed understanding of the risks benefits and agree with plans for revision of the left total knee arthroplasty.  Patient tolerated the surgery well. No complications .Patient was taken to PACU where she was stabilized and then transferred to the orthopedic floor.  Patient started on Lovenox 30mg  q 12 hrs. Foot pumps applied bilaterally at 80 mm hgb. Heels elevated off bed with rolled towels. No  evidence of DVT. Calves non tender. Negative Homan. Physical therapy started on day #1 for gait training and transfer with OT starting on  day #1 for ADL and assisted devices. Patient has done well with therapy. Ambulated greater than 30 feet upon being discharged.  Patient's IV And Foley were  discontinued on day #1 with Hemovac being discontinued on day #2. Dressing was changed on day 2 prior to patient being discharged   She was given perioperative antibiotics:  Anti-infectives    Start     Dose/Rate Route Frequency Ordered Stop   12/07/16 2200  ceFAZolin (ANCEF) IVPB 2g/100 mL premix     2 g 200 mL/hr over 30 Minutes Intravenous Every 6 hours 12/07/16 2156 12/08/16 2159   12/07/16 1012  ceFAZolin (ANCEF) 2-4 GM/100ML-% IVPB    Comments:  Ronnell Freshwater   : cabinet override      12/07/16 1012 12/07/16 1756   12/07/16 0600  ceFAZolin (ANCEF) IVPB 2g/100 mL premix     2 g 200 mL/hr over 30 Minutes Intravenous On call to O.R. 12/06/16 2141 12/07/16 1826    .  She was fitted with AV 1 compression foot pump devices, instructed on heel pumps, early ambulation, and fitted with TED stockings bilaterally for DVT prophylaxis.  She benefited maximally from the hospital stay and there were no complications.    Recent vital signs:  Vitals:   12/08/16 0346 12/08/16 0404  BP:    Pulse:    Resp:    Temp:  98.9 F (37.2 C)  SpO2: 93%     Recent laboratory studies:  Lab Results  Component Value Date   HGB 11.3 (L) 12/08/2016   HGB 12.9 11/24/2016   HGB 12.4 10/13/2016   Lab Results  Component Value Date   WBC 10.0 12/08/2016   PLT 271 12/08/2016   Lab Results  Component Value Date   INR 0.94 11/24/2016   Lab Results  Component Value Date   NA 138 12/08/2016   K 3.3 (L) 12/08/2016   CL 105 12/08/2016   CO2 27 12/08/2016   BUN 16 12/08/2016   CREATININE 0.98 12/08/2016   GLUCOSE 150 (H) 12/08/2016    Discharge Medications:   Allergies as of 12/08/2016      Reactions   Lovastatin Other (See Comments)   chest   Pravastatin    Other reaction(s): Muscle Pain chest   Rosuvastatin    Other reaction(s): Muscle Pain chest Other reaction(s): Muscle Pain chest   Statins    Other reaction(s): Muscle Pain chest      Medication List    STOP taking these  medications   aspirin EC 81 MG tablet     TAKE these medications   acetaminophen 500 MG tablet Commonly known as:  TYLENOL Take 1 tablet (500 mg total) by mouth 2 (two) times daily.   amLODipine 5 MG tablet Commonly known as:  NORVASC Take 1 tablet (5 mg total) by mouth daily.   atorvastatin 40 MG tablet Commonly known as:  LIPITOR take 1 tablet by mouth once daily   Calcium-Vitamin D 600-200 MG-UNIT tablet Take 1 tablet by mouth daily.   gabapentin 300 MG capsule Commonly known as:  NEURONTIN take 1 capsule by mouth two times a day What changed:  how much to take  how to take this  when to take this  additional instructions   losartan 50 MG tablet Commonly known as:  COZAAR Take 1 tablet (50 mg total) by mouth daily.  meloxicam 15 MG tablet Commonly known as:  MOBIC Take 1 tablet by mouth daily.   oxyCODONE 5 MG immediate release tablet Commonly known as:  Oxy IR/ROXICODONE Take 1 tablet (5 mg total) by mouth every 3 (three) hours as needed for moderate pain ((score 4 to 6)).   pantoprazole 40 MG tablet Commonly known as:  PROTONIX Take 1 tablet (40 mg total) by mouth daily.   ranitidine 150 MG tablet Commonly known as:  ZANTAC Take 150 mg by mouth 2 (two) times daily.   traMADol 50 MG tablet Commonly known as:  ULTRAM Take 1-2 tablets (50-100 mg total) by mouth every 4 (four) hours as needed for moderate pain.   traMADol-acetaminophen 37.5-325 MG tablet Commonly known as:  ULTRACET Take 1 tablet by mouth every 8 (eight) hours as needed.            Durable Medical Equipment        Start     Ordered   12/07/16 2157  DME Walker rolling  Once    Question:  Patient needs a walker to treat with the following condition  Answer:  Total knee replacement status   12/07/16 2156   12/07/16 2157  DME Bedside commode  Once    Question:  Patient needs a bedside commode to treat with the following condition  Answer:  Total knee replacement status    12/07/16 2156      Diagnostic Studies: Dg Knee Left Port  Result Date: 12/07/2016 CLINICAL DATA:  81 year old female with total knee replacement. EXAM: PORTABLE LEFT KNEE - 1-2 VIEW COMPARISON:  Nuclear medicine study dated 08/31/2016 FINDINGS: There is a total left knee arthroplasty. The arthroplasty components appear intact and in anatomic alignment. A fixation sideplate noted along the lateral aspect of the distal female. There is an approximately 4 mm gap between the sideplate and the cortex of the bone. No acute fracture identified. There is no dislocation. The bones are osteopenic. Cutaneous surgical clips and mild soft tissue edema as well as multiple drainage tubes noted. IMPRESSION: Postsurgical changes of total left knee arthroplasty. Intact arthroplasty component and in anatomic alignment. Distal femoral fixation sideplate with an approximately 4 mm gap to the cortex of the bone. Electronically Signed   By: Anner Crete M.D.   On: 12/07/2016 21:07   Dg C-arm 1-60 Min-no Report  Result Date: 12/07/2016 Fluoroscopy was utilized by the requesting physician.  No radiographic interpretation.    Disposition: 01-Home or Self Care  Discharge Instructions    Diet - low sodium heart healthy    Complete by:  As directed    Increase activity slowly    Complete by:  As directed       Follow-up Information    Watt Climes, PA Follow up on 12/22/2016.   Specialty:  Physician Assistant Why:  at 10:15am Contact information: [place] [id number] [phone]        Dereck Leep, MD Follow up on 01/19/2017.   Specialty:  Orthopedic Surgery Why:  at 10:00am Contact information: [place] [address] [phone]            Signed: Watt Climes 12/08/2016, 7:36 AM

## 2016-12-08 NOTE — Progress Notes (Signed)
ORTHOPAEDICS PROGRESS NOTE  PATIENT NAME: Catherine Burgess DOB: 01-06-1936  MRN: [id number]  POD # 1: Left total knee revision arthroplasty (femoral component); ORIF left distal lateral femoral condyle fracture  Subjective: The patient is up in the chair today.  She states that the pain has been well controlled. No nausea or vomiting.  Objective: Vital signs in last 24 hours: Temp:  [97 F (36.1 C)-98.9 F (37.2 C)] 98.9 F (37.2 C) (10/30 0404) Pulse Rate:  [61-95] 95 (10/30 0302) Resp:  [11-19] 19 (10/29 2146) BP: (119-142)/(56-76) 128/56 (10/30 0302) SpO2:  [89 %-100 %] 93 % (10/30 0346) Weight:  [65.3 kg (144 lb)] 65.3 kg (144 lb) (10/29 1004)  Intake/Output from previous day: 10/29 0701 - 10/30 0700 In: 2400 [I.V.:2200; IV Piggyback:200] Out: 2005 [Urine:1900; Drains:5; Blood:100]   Recent Labs  12/08/16 0508  WBC 10.0  HGB 11.3*  HCT 35.3  PLT 271  K 3.3*  CL 105  CO2 27  BUN 16  CREATININE 0.98  GLUCOSE 150*  CALCIUM 8.4*    EXAM General: Well-developed well-nourished female seen in no apparent discomfort. Lungs: clear to auscultation Cardiac: normal rate and regular rhythm Left lower extremity: Dressing is dry and intact.  Hemovac drain is in place.  Homans test is negative. Neurologic: Awake, alert, and oriented.  Sensory and motor function are grossly intact.  Assessment: Left total knee revision arthroplasty (femoral component) ORIF left distal lateral femoral condyle fracture  Secondary diagnoses: GERD Hypertension Hyperlipidemia S/P left hip arthrodesis Hypokalemia  Plan: Begin physical therapy.  Patient is to be touchdown weightbearing to the left lower extremity. Supplemental potassium today.  Recheck metabolic B panel in the morning. Plan is to go Skilled nursing facility after hospital stay. DVT Prophylaxis - Lovenox, Foot Pumps and TED hose  Dillan Candela P. Holley Bouche M.D.

## 2016-12-08 NOTE — Progress Notes (Signed)
Social work Theatre manager presented bed offers to patient. [facility] chose [facility] liaison is aware of accepted bed offer.  [employer], LCSW [phone]

## 2016-12-08 NOTE — Progress Notes (Signed)
Physical Therapy Treatment Patient Details Name: Catherine Burgess MRN: [id number] DOB: 27-Dec-1935 Today's Date: 12/08/2016    History of Present Illness Pt admitted for L TKR revision and L distal femoral condyle fracture now s/p ORIF.     PT Comments    Pt doing well this afternoon and agreeable to sit in recliner. Good endurance with there-ex this date and improved ability to maintain correct WBing status. Will continue to progress.   Follow Up Recommendations  SNF     Equipment Recommendations  None recommended by PT    Recommendations for Other Services       Precautions / Restrictions Precautions Precautions: Fall;Knee Precaution Booklet Issued: No Restrictions Weight Bearing Restrictions: Yes LLE Weight Bearing: Touchdown weight bearing    Mobility  Bed Mobility               General bed mobility comments: not performed as pt recevied in chair  Transfers Overall transfer level: Needs assistance Equipment used: Rolling walker (2 wheeled) Transfers: Sit to/from Stand Sit to Stand: Min assist         General transfer comment: Needs cues to anteriorly shift weight prior to standing up. Once standing, able to maintain balance.  Ambulation/Gait Ambulation/Gait assistance: Min assist Ambulation Distance (Feet): 30 Feet Assistive device: Rolling walker (2 wheeled) Gait Pattern/deviations: Step-to pattern     General Gait Details: ambulated to bathroom, improved technique and ability to maintain correct WBing precautions during ambulation in PM session. Still fatigues quickly, unable to further ambulate in hallway this date.   Stairs            Wheelchair Mobility    Modified Rankin (Stroke Patients Only)       Balance Overall balance assessment: Needs assistance Sitting-balance support: Feet supported Sitting balance-Leahy Scale: Good     Standing balance support: Bilateral upper extremity supported Standing balance-Leahy Scale:  Good                              Cognition Arousal/Alertness: Awake/alert Behavior During Therapy: WFL for tasks assessed/performed Overall Cognitive Status: Within Functional Limits for tasks assessed                                        Exercises Total Joint Exercises Goniometric ROM: L knee AROM 0-70 degrees. No overpressure given. Other Exercises Other Exercises: L LE ther-ex performed including ankle pumps, quad sets, glut sets, SLRs, LAQ, SAQ, and hip abd/add. All ther-ex performed x 10 reps with cga and cues for correct technique Other Exercises: Pt ambulated to bathroom with RW, needs min assist for hygiene and cues for set up.    General Comments        Pertinent Vitals/Pain Pain Assessment: Faces Pain Score: 6  Faces Pain Scale: Hurts little more Pain Location: L knee Pain Descriptors / Indicators: Aching;Discomfort;Dull;Operative site guarding Pain Intervention(s): Limited activity within patient's tolerance    Home Living Family/patient expects to be discharged to:: Private residence Living Arrangements: Alone Available Help at Discharge: Family Type of Home: House Home Access: Level entry   Home Layout: One level Home Equipment: Cane - single point;Walker - 2 wheels Additional Comments: has son who lives on same road    Prior Function Level of Independence: Independent      Comments: occassionally uses [facility] for community distances   PT  Goals (current goals can now be found in the care plan section) Acute Rehab PT Goals Patient Stated Goal: to get stronger PT Goal Formulation: With patient Time For Goal Achievement: 12/22/16 Potential to Achieve Goals: Good Additional Goals Additional Goal #1: Pt will be able to perform bed mobility/transfers with RW and supervision in order to improve functional mobility Progress towards PT goals: Progressing toward goals    Frequency    BID      PT Plan Current plan remains  appropriate    Co-evaluation              AM-PAC PT "6 Clicks" Daily Activity  Outcome Measure  Difficulty turning over in bed (including adjusting bedclothes, sheets and blankets)?: Unable Difficulty moving from lying on back to sitting on the side of the bed? : Unable Difficulty sitting down on and standing up from a chair with arms (e.g., wheelchair, bedside commode, etc,.)?: Unable Help needed moving to and from a bed to chair (including a wheelchair)?: A Little Help needed walking in hospital room?: A Little Help needed climbing 3-5 steps with a railing? : A Lot 6 Click Score: 11    End of Session Equipment Utilized During Treatment: Gait belt Activity Tolerance: Patient tolerated treatment well Patient left: in chair;with chair alarm set;with family/visitor present;with SCD's reapplied Nurse Communication: Mobility status PT Visit Diagnosis: Pain;Muscle weakness (generalized) (M62.81);Difficulty in walking, not elsewhere classified (R26.2) Pain - Right/Left: Left Pain - part of body: Knee     Time: 8099-8338 PT Time Calculation (min) (ACUTE ONLY): 23 min  Charges:  $Gait Training: 8-22 mins $Therapeutic Exercise: 8-22 mins $Therapeutic Activity: 8-22 mins                    G Codes:  Functional Assessment Tool Used: AM-PAC 6 Clicks Basic Mobility Functional Limitation: Mobility: Walking and moving around Mobility: Walking and Moving Around Current Status (S5053): At least 60 percent but less than 80 percent impaired, limited or restricted Mobility: Walking and Moving Around Goal Status [phone]): At least 40 percent but less than 60 percent impaired, limited or restricted    [name], PT, DPT [phone]    Catherine Burgess 12/08/2016, 4:16 PM

## 2016-12-08 NOTE — Progress Notes (Signed)
Pt alert and up to bedside chair this morning. Pt tolerated well.

## 2016-12-08 NOTE — Discharge Instructions (Signed)

## 2016-12-08 NOTE — Progress Notes (Signed)
Pt remaining alert and oriented this shift. Surgical dressing dry and intact. Iv infusing without difficulty. Foley patent and draining urine. Pain control with oral pain medication. Pt able to sleep some in between care. No complaints of nausea or vomiting just lack of an apatite.

## 2016-12-08 NOTE — Progress Notes (Addendum)
OT Cancellation Note  Patient Details Name: Catherine Burgess MRN: [id number] DOB: 1935/04/08   Cancelled Treatment:    Reason Eval/Treat Not Completed: Patient declined, no reason specified. Order received, chart reviewed. Pt with family in the room. Educated pt on role of OT. Pt fatigued, 5/10 pain, requesting OT come back next date to re-attempt OT evaluation. Will re-attempt OT evaluation in the morning as pt is available.  Jeni Salles, MPH, MS, OTR/L ascom [phone] 12/08/16, 4:24 PM

## 2016-12-08 NOTE — Clinical Social Work Placement (Signed)
   CLINICAL SOCIAL WORK PLACEMENT  NOTE  Date:  12/08/2016  Patient Details  Name: Catherine Burgess MRN: [id number] Date of Birth: Jul 06, 1935  Clinical Social Work is seeking post-discharge placement for this patient at the [place] level of care (*CSW will initial, date and re-position this form in  chart as items are completed):  Yes   Patient/family provided with [facility] Work Department's list of facilities offering this level of care within the geographic area requested by the patient (or if unable, by the patient's family).  Yes   Patient/family informed of their freedom to choose among providers that offer the needed level of care, that participate in Medicare, Medicaid or managed care program needed by the patient, have an available bed and are willing to accept the patient.  Yes   Patient/family informed of [facility]'s ownership interest in [facility] and [facility], as well as of the fact that they are under no obligation to receive care at these facilities.  PASRR submitted to EDS on 12/08/16     PASRR number received on 12/08/16     Existing PASRR number confirmed on       FL2 transmitted to all facilities in geographic area requested by pt/family on 12/08/16     FL2 transmitted to all facilities within larger geographic area on       Patient informed that his/her managed care company has contracts with or will negotiate with certain facilities, including the following:            Patient/family informed of bed offers received.  Patient chooses bed at       Physician recommends and patient chooses bed at      Patient to be transferred to   on  .  Patient to be transferred to facility by       Patient family notified on   of transfer.  Name of family member notified:        PHYSICIAN       Additional Comment:    _______________________________________________ [place] Kirchner, Veronia Beets, LCSW 12/08/2016, 12:21  PM

## 2016-12-08 NOTE — Care Management (Signed)
Patient s/p Left knee revision arthroplasty (femoral component) Open reduction and internal fixation of the left distal lateral femoral condyle fracture.  RNCM consult for DME equipment as needed. PT consult pending.

## 2016-12-08 NOTE — Progress Notes (Signed)
Pt's family were looking for pt's Rm. [place] escorted them to pt's family and waited on them in the hallway as the therapist was assisting pt to use bathroom. Pt's family spoke about pt's health and her surgery. When therapist completed her visit, [place] escorted family into the Rm, spoke with pt briefly and left. [facility] to follow-up with pt as needed.   12/08/16 1400  Clinical Encounter Type  Visited With Patient;Patient and family together;Health care provider  Visit Type Initial;Other (Comment)  Referral From Family  Consult/Referral To Chaplain  Spiritual Encounters  Spiritual Needs Other (Comment)

## 2016-12-08 NOTE — Anesthesia Postprocedure Evaluation (Signed)
Anesthesia Post Note  Patient: Catherine Burgess  Procedure(s) Performed: TOTAL KNEE REVISION (Left Knee)  Patient location during evaluation: Nursing Unit Anesthesia Type: Spinal Level of consciousness: awake and alert and oriented Pain management: pain level controlled Vital Signs Assessment: post-procedure vital signs reviewed and stable Respiratory status: spontaneous breathing and nonlabored ventilation Cardiovascular status: stable Postop Assessment: no backache and adequate PO intake Anesthetic complications: no     Last Vitals:  Vitals:   12/08/16 0754 12/08/16 1037  BP: (!) 129/54 (!) 121/56  Pulse: 74 70  Resp: 16   Temp: 36.6 C   SpO2: 97%     Last Pain:  Vitals:   12/08/16 1037  TempSrc:   PainSc: 5                  [name]

## 2016-12-08 NOTE — Evaluation (Signed)
Physical Therapy Evaluation Patient Details Name: Catherine Burgess MRN: [id number] DOB: 10-12-1935 Today's Date: 12/08/2016   History of Present Illness  Pt admitted for L TKR revision and L distal femoral condyle fracture now s/p ORIF.   Clinical Impression  Pt is a pleasant 81 year old female who was admitted for L TKR revision along with ORIF of distal femoral condyle fracture. Pt performs transfers and ambulation with min assist and RW. Pt in recliner upon arrival. Educated on correct WBing status, slight difficulty in maintaining. Pt demonstrates deficits with strength/mobility/endurance. Pt demonstrates ability to perform 10 SLRs with independence, therefore does not require KI for mobility. Would benefit from skilled PT to address above deficits and promote optimal return to PLOF; recommend transition to STR upon discharge from acute hospitalization.       Follow Up Recommendations SNF    Equipment Recommendations  None recommended by PT    Recommendations for Other Services       Precautions / Restrictions Precautions Precautions: Fall;Knee Precaution Booklet Issued: No Restrictions Weight Bearing Restrictions: Yes LLE Weight Bearing: Touchdown weight bearing      Mobility  Bed Mobility               General bed mobility comments: not performed as pt recevied in chair  Transfers Overall transfer level: Needs assistance Equipment used: Rolling walker (2 wheeled) Transfers: Sit to/from Stand Sit to Stand: Min assist         General transfer comment: transfers performed with RW with heavy cues for WBing status. Safe technqiue performed. Readjusted RW to correct height  Ambulation/Gait Ambulation/Gait assistance: Min assist Ambulation Distance (Feet): 30 Feet Assistive device: Rolling walker (2 wheeled) Gait Pattern/deviations: Step-to pattern     General Gait Details: ambulated in room with correct WBing however needs multiple cues for correct  technique. B arms fatigue quickly, unable to further ambulate at this time.  Stairs            Wheelchair Mobility    Modified Rankin (Stroke Patients Only)       Balance Overall balance assessment: Needs assistance Sitting-balance support: Feet supported Sitting balance-Leahy Scale: Good     Standing balance support: Bilateral upper extremity supported Standing balance-Leahy Scale: Good                               Pertinent Vitals/Pain Pain Assessment: 0-10 Pain Score: 6  Pain Location: L knee Pain Descriptors / Indicators: Aching;Discomfort;Dull;Operative site guarding Pain Intervention(s): Limited activity within patient's tolerance;Ice applied    Home Living Family/patient expects to be discharged to:: Private residence Living Arrangements: Alone Available Help at Discharge: Family Type of Home: House Home Access: Level entry     Home Layout: [place] - single point;Walker - 2 wheels Additional Comments: has son who lives on same road    Prior Function Level of Independence: Independent         Comments: occassionally uses SPC for community distances     Hand Dominance        Extremity/Trunk Assessment   Upper Extremity Assessment Upper Extremity Assessment: Overall WFL for tasks assessed    Lower Extremity Assessment Lower Extremity Assessment: Generalized weakness (L LE grossly 3+/5; R LE grossly 4+/5)       Communication   Communication: No difficulties  Cognition Arousal/Alertness: Awake/alert Behavior During Therapy: WFL for tasks assessed/performed Overall Cognitive Status: Within Functional Limits for tasks  assessed                                        General Comments      Exercises Total Joint Exercises Goniometric ROM: L knee AROM 0-70 degrees. No overpressure given. Other Exercises Other Exercises: L LE ther-ex performed including ankle pumps, quad sets, SLRs, and  hip abd/add. All ther-ex performed x 10 reps with cga. Safe technique Other Exercises: Pt ambulated to bathroom with RW, needs min assist for hygiene and cues for set up.   Assessment/Plan    PT Assessment Patient needs continued PT services  PT Problem List Decreased strength;Decreased range of motion;Decreased activity tolerance;Decreased mobility;Pain       PT Treatment Interventions DME instruction;Gait training;Therapeutic activities;Therapeutic exercise    PT Goals (Current goals can be found in the Care Plan section)  Acute Rehab PT Goals Patient Stated Goal: to get stronger PT Goal Formulation: With patient Time For Goal Achievement: 12/22/16 Potential to Achieve Goals: Good    Frequency BID   Barriers to discharge Decreased caregiver support      Co-evaluation               AM-PAC PT "6 Clicks" Daily Activity  Outcome Measure Difficulty turning over in bed (including adjusting bedclothes, sheets and blankets)?: Unable Difficulty moving from lying on back to sitting on the side of the bed? : Unable Difficulty sitting down on and standing up from a chair with arms (e.g., wheelchair, bedside commode, etc,.)?: Unable Help needed moving to and from a bed to chair (including a wheelchair)?: A Little Help needed walking in hospital room?: A Little Help needed climbing 3-5 steps with a railing? : A Lot 6 Click Score: 11    End of Session Equipment Utilized During Treatment: Gait belt Activity Tolerance: Patient tolerated treatment well Patient left: in chair;with chair alarm set;with family/visitor present;with SCD's reapplied Nurse Communication: Mobility status PT Visit Diagnosis: Pain;Muscle weakness (generalized) (M62.81);Difficulty in walking, not elsewhere classified (R26.2) Pain - Right/Left: Left Pain - part of body: Knee    Time: 1761-6073 PT Time Calculation (min) (ACUTE ONLY): 38 min   Charges:   PT Evaluation $PT Eval Low Complexity: 1 Low PT  Treatments $Therapeutic Exercise: 8-22 mins $Therapeutic Activity: 8-22 mins   PT G Codes:   PT G-Codes **NOT FOR INPATIENT CLASS** Functional Assessment Tool Used: AM-PAC 6 Clicks Basic Mobility Functional Limitation: Mobility: Walking and moving around Mobility: Walking and Moving Around Current Status (X1062): At least 60 percent but less than 80 percent impaired, limited or restricted Mobility: Walking and Moving Around Goal Status [phone]): At least 40 percent but less than 60 percent impaired, limited or restricted    [name], PT, DPT [phone]   Stormee Duda 12/08/2016, 1:51 PM

## 2016-12-09 LAB — CBC
HEMATOCRIT: 31.6 % — AB (ref 35.0–47.0)
Hemoglobin: 10.4 g/dL — ABNORMAL LOW (ref 12.0–16.0)
MCH: 26.8 pg (ref 26.0–34.0)
MCHC: 32.9 g/dL (ref 32.0–36.0)
MCV: 81.4 fL (ref 80.0–100.0)
Platelets: 270 10*3/uL (ref 150–440)
RBC: 3.88 MIL/uL (ref 3.80–5.20)
RDW: 17.1 % — ABNORMAL HIGH (ref 11.5–14.5)
WBC: 9.2 10*3/uL (ref 3.6–11.0)

## 2016-12-09 LAB — BASIC METABOLIC PANEL
ANION GAP: 4 — AB (ref 5–15)
BUN: 18 mg/dL (ref 6–20)
CHLORIDE: 108 mmol/L (ref 101–111)
CO2: 29 mmol/L (ref 22–32)
Calcium: 8.2 mg/dL — ABNORMAL LOW (ref 8.9–10.3)
Creatinine, Ser: 0.82 mg/dL (ref 0.44–1.00)
GFR calc Af Amer: >60 mL/min (ref >60)
GFR calc non Af Amer: >60 mL/min (ref >60)
GLUCOSE: 137 mg/dL — AB (ref 65–99)
POTASSIUM: 4 mmol/L (ref 3.5–5.1)
Sodium: 141 mmol/L (ref 135–145)

## 2016-12-09 LAB — SURGICAL PATHOLOGY

## 2016-12-09 MED ORDER — LACTULOSE 10 GM/15ML PO SOLN: 10.0000 g | Freq: Two times a day (BID) | ORAL | Status: DC | PRN

## 2016-12-09 MED ORDER — ENOXAPARIN SODIUM 40 MG/0.4ML ~~LOC~~ SOLN: 40.0000 mg | SUBCUTANEOUS | 0 refills | Status: DC

## 2016-12-09 NOTE — Evaluation (Signed)
Occupational Therapy Evaluation Patient Details Name: Catherine Burgess MRN: [id number] DOB: 04/24/1935 Today's Date: 12/09/2016    History of Present Illness Pt admitted for L TKR revision and L distal femoral condyle fracture now s/p ORIF.    Clinical Impression   Pt is 81 year old female s/p L TKR revision and L distal femoral condyle fracture.  Pt was independent in all ADLs prior to surgery, living by herself, and is eager to return to PLOF.  Pt currently requires minimal assist for LB dressing while in seated position due to pain and limited AROM of L knee. Min guard to min assist for mobility with verbal cues for TDWB'ing status of LLE. >8 minutes spent on review of polar care mgt, educated on benefits of and placement of grab bars in the shower (she has a very small one on the back of the shower, none on the long side of the shower), reviewed use of BSC for in the shower, beside bed, and over toilet, and AE/DME for ADL tasks. Pt verbalized understanding of all education/training provided. Pt would benefit from additional instruction in dressing techniques with or without assistive devices for dressing and bathing skills in order to maximize recall and carry over of learned techniques, additional education/training in precautions, and further recommendations for home modifications to increase safety in the bathroom and prevent falls. Recommend transition to STR prior to safe return home.      Follow Up Recommendations  SNF    Equipment Recommendations  Other (comment) (grab bars in bathroom)    Recommendations for Other Services       Precautions / Restrictions Precautions Precautions: Fall;Knee Precaution Booklet Issued: No Restrictions Weight Bearing Restrictions: Yes LLE Weight Bearing: Touchdown weight bearing      Mobility Bed Mobility Overal bed mobility: Needs Assistance Bed Mobility: Supine to Sit     Supine to sit: Min guard;Min assist;HOB elevated     General  bed mobility comments: min guard to min assist for LLE mgt off the side of the bed.   Transfers Overall transfer level: Needs assistance Equipment used: Rolling walker (2 wheeled) Transfers: Sit to/from Stand Sit to Stand: Min guard;Min assist         General transfer comment: cues for weightbearing/hand placement    Balance Overall balance assessment: Needs assistance Sitting-balance support: Feet supported Sitting balance-Leahy Scale: Good     Standing balance support: Bilateral upper extremity supported Standing balance-Leahy Scale: Good                             ADL either performed or assessed with clinical judgement   ADL Overall ADL's : Needs assistance/impaired Eating/Feeding: Sitting;Set up   Grooming: Standing;Set up;Min guard   Upper Body Bathing: Sitting;Set up;Supervision/ safety   Lower Body Bathing: Sitting/lateral leans;Sit to/from stand;Min guard   Upper Body Dressing : Sitting;Set up   Lower Body Dressing: Sitting/lateral leans;Sit to/from stand;Minimal assistance Lower Body Dressing Details (indicate cue type and reason): Pt has AE at home from previous knee surgery but could not recall how to use for LB dressing. Pt educated in use of AE for LB dressing with verbal instruction and visual demonstration. Pt verbalized understanding. Toilet Transfer: RW;Ambulation;Min guard;Cueing for safety           Functional mobility during ADLs: Rolling walker;Min guard;Cueing for safety General ADL Comments: generally min assist for LB ADL tasks, verbal cues for weightbearing status  Vision Baseline Vision/History: Wears glasses Wears Glasses: At all times Patient Visual Report: No change from baseline Vision Assessment?: No apparent visual deficits     Perception     Praxis      Pertinent Vitals/Pain Pain Assessment: 0-10 Pain Score: 5  Pain Location: L knee Pain Descriptors / Indicators: Aching Pain Intervention(s): Limited  activity within patient's tolerance;Monitored during session;Premedicated before session;Repositioned;Patient requesting pain meds-RN notified;Ice applied     Hand Dominance     Extremity/Trunk Assessment Upper Extremity Assessment Upper Extremity Assessment: Overall WFL for tasks assessed   Lower Extremity Assessment Lower Extremity Assessment: Generalized weakness;Defer to PT evaluation   Cervical / Trunk Assessment Cervical / Trunk Assessment: Normal   Communication Communication Communication: No difficulties   Cognition Arousal/Alertness: Awake/alert Behavior During Therapy: WFL for tasks assessed/performed Overall Cognitive Status: Within Functional Limits for tasks assessed                                     General Comments       Exercises Other Exercises Other Exercises: OT reviewed polar care mgt, educated on benefits of and placement of grab bars in the shower (she has a very small one on the back of the shower, none on the long side of the shower), reviewed use of BSC for in the shower, beside bed, and over toilet. Pt verbalized understanding of all education/training provided.   Shoulder Instructions      Home Living Family/patient expects to be discharged to:: Private residence Living Arrangements: Alone Available Help at Discharge: Family Type of Home: House Home Access: Level entry     [place]: One level     Bathroom Shower/Tub: Teacher, early years/pre: [place] - single point;Walker - 2 wheels;Shower seat;Bedside commode;Adaptive equipment Adaptive Equipment: Reacher;Sock aid Additional Comments: has son who lives on same road      Prior Functioning/Environment Level of Independence: Independent        Comments: occassionally uses SPC for community distances        OT Problem List: Decreased strength;Decreased range of motion;Decreased safety awareness;Decreased activity  tolerance;Impaired balance (sitting and/or standing);Decreased knowledge of use of DME or AE;Decreased knowledge of precautions      OT Treatment/Interventions: Self-care/ADL training;Therapeutic exercise;Therapeutic activities;DME and/or AE instruction;Patient/family education    OT Goals(Current goals can be found in the care plan section) Acute Rehab OT Goals Patient Stated Goal: to get stronger OT Goal Formulation: With patient Time For Goal Achievement: 12/23/16 Potential to Achieve Goals: Good  OT Frequency: Min 1X/week   Barriers to D/C: Decreased caregiver support          Co-evaluation              AM-PAC PT "6 Clicks" Daily Activity     Outcome Measure Help from another person eating meals?: A Little Help from another person taking care of personal grooming?: A Little Help from another person toileting, which includes using toliet, bedpan, or urinal?: A Little Help from another person bathing (including washing, rinsing, drying)?: A Little Help from another person to put on and taking off regular upper body clothing?: A Little Help from another person to put on and taking off regular lower body clothing?: A Little 6 Click Score: 18   End of Session Equipment Utilized During Treatment: Gait belt;Rolling walker  Activity Tolerance: Patient tolerated treatment well Patient  left: in chair;with call bell/phone within reach;with chair alarm set;with SCD's reapplied;Other (comment) (polar care in place)  OT Visit Diagnosis: Other abnormalities of gait and mobility (R26.89)                Time: 3552-1747 OT Time Calculation (min): 15 min Charges:  OT General Charges $OT Visit: 1 Visit OT Evaluation $OT Eval Low Complexity: 1 Low OT Treatments $Self Care/Home Management : 8-22 mins G-Codes: OT G-codes **NOT FOR INPATIENT CLASS** Functional Assessment Tool Used: AM-PAC 6 Clicks Daily Activity;Clinical judgement Functional Limitation: Self care Self Care Current  Status (F5953): At least 40 percent but less than 60 percent impaired, limited or restricted Self Care Goal Status (X6728): At least 20 percent but less than 40 percent impaired, limited or restricted   Jeni Salles, MPH, MS, OTR/L ascom [phone] 12/09/16, 9:09 AM

## 2016-12-09 NOTE — Progress Notes (Signed)
Pt remaining alert and oriented. Medicated for pain during the nigh with good results. Eating and drinking without difficulty. Walking to bathroom with assistance and voiding without difficulty. Surgical dressing remaining dry and intact.

## 2016-12-09 NOTE — Clinical Social Work Placement (Signed)
   CLINICAL SOCIAL WORK PLACEMENT  NOTE  Date:  12/09/2016  Patient Details  Name: Catherine Burgess MRN: [id number] Date of Birth: 1935-11-06  Clinical Social Work is seeking post-discharge placement for this patient at the [place] level of care (*CSW will initial, date and re-position this form in  chart as items are completed):  Yes   Patient/family provided with [facility] Work Department's list of facilities offering this level of care within the geographic area requested by the patient (or if unable, by the patient's family).  Yes   Patient/family informed of their freedom to choose among providers that offer the needed level of care, that participate in Medicare, Medicaid or managed care program needed by the patient, have an available bed and are willing to accept the patient.  Yes   Patient/family informed of [facility]'s ownership interest in [facility] and [facility], as well as of the fact that they are under no obligation to receive care at these facilities.  PASRR submitted to EDS on 12/08/16     PASRR number received on 12/08/16     Existing PASRR number confirmed on       FL2 transmitted to all facilities in geographic area requested by pt/family on 12/08/16     FL2 transmitted to all facilities within larger geographic area on       Patient informed that his/her managed care company has contracts with or will negotiate with certain facilities, including the following:        Yes   Patient/family informed of bed offers received.  Patient chooses bed at  ([facility] )     Physician recommends and patient chooses bed at      Patient to be transferred to  ([facility] ) on 12/09/16.  Patient to be transferred to facility by  [facility] )     Patient family notified on 12/09/16 of transfer.  Name of family member notified:   (Patient's son [name] is aware of D/C today. )     PHYSICIAN       Additional Comment:     _______________________________________________ Antoine Vandermeulen, Veronia Beets, LCSW 12/09/2016, 9:58 AM

## 2016-12-09 NOTE — Progress Notes (Signed)
Physical Therapy Treatment Patient Details Name: Catherine Burgess MRN: [id number] DOB: Apr 14, 1935 Today's Date: 12/09/2016    History of Present Illness Pt admitted for L TKR revision and L distal femoral condyle fracture now s/p ORIF.     PT Comments    Pt has met PT goals and is ready for dc. Pt aware of WBing status and demonstrates improved technique during ambulation, although slow due to B UE fatigue. Good endurance with there-ex.   Follow Up Recommendations  SNF     Equipment Recommendations  None recommended by PT    Recommendations for Other Services       Precautions / Restrictions Precautions Precautions: Fall;Knee Precaution Booklet Issued: No Restrictions Weight Bearing Restrictions: Yes LLE Weight Bearing: Touchdown weight bearing    Mobility  Bed Mobility Overal bed mobility: Needs Assistance Bed Mobility: Supine to Sit     Supine to sit: Min guard;Min assist;HOB elevated     General bed mobility comments: not performed as pt received in recliner  Transfers Overall transfer level: Needs assistance Equipment used: Rolling walker (2 wheeled) Transfers: Sit to/from Stand Sit to Stand: Min guard         General transfer comment: safe technique with occasional cues to scoot forward in chair prior to transfer  Ambulation/Gait Ambulation/Gait assistance: Min assist Ambulation Distance (Feet): 80 Feet Assistive device: Rolling walker (2 wheeled) Gait Pattern/deviations: Step-to pattern     General Gait Details: ambulated in hallway, improved ability to maintain correct WBing status. Safe technique performed, although fatigues quickly.   Stairs            Wheelchair Mobility    Modified Rankin (Stroke Patients Only)       Balance Overall balance assessment: Needs assistance Sitting-balance support: Feet supported Sitting balance-Leahy Scale: Good     Standing balance support: Bilateral upper extremity supported Standing  balance-Leahy Scale: Good                              Cognition Arousal/Alertness: Awake/alert Behavior During Therapy: WFL for tasks assessed/performed Overall Cognitive Status: Within Functional Limits for tasks assessed                                        Exercises Total Joint Exercises Goniometric ROM: L knee AROM: 0-75 degrees, no overpressure given Other Exercises Other Exercises: L LE ther-ex performed including ankle pumps, quad sets, glut sets, SLRs, SAQ, and hip abd/add. All ther-ex performed x 15 reps with cga and cues for sequencing    General Comments        Pertinent Vitals/Pain Pain Assessment: Faces Pain Score: 5  Faces Pain Scale: Hurts little more Pain Location: L knee Pain Descriptors / Indicators: Aching Pain Intervention(s): Limited activity within patient's tolerance    Home Living Family/patient expects to be discharged to:: Private residence Living Arrangements: Alone Available Help at Discharge: Family Type of Home: House Home Access: Level entry   Home Layout: One level Home Equipment: [place] - single point;Walker - 2 wheels;Shower seat;Bedside commode;Adaptive equipment Additional Comments: has son who lives on same road    Prior Function Level of Independence: Independent      Comments: occassionally uses SPC for community distances   PT Goals (current goals can now be found in the care plan section) Acute Rehab PT Goals Patient Stated Goal: to  get stronger PT Goal Formulation: With patient Time For Goal Achievement: 12/22/16 Potential to Achieve Goals: Good Progress towards PT goals: Progressing toward goals    Frequency    BID      PT Plan Current plan remains appropriate    Co-evaluation              AM-PAC PT "6 Clicks" Daily Activity  Outcome Measure  Difficulty turning over in bed (including adjusting bedclothes, sheets and blankets)?: Unable Difficulty moving from lying on back to  sitting on the side of the bed? : Unable Difficulty sitting down on and standing up from a chair with arms (e.g., wheelchair, bedside commode, etc,.)?: Unable Help needed moving to and from a bed to chair (including a wheelchair)?: A Little Help needed walking in hospital room?: A Little Help needed climbing 3-5 steps with a railing? : A Lot 6 Click Score: 11    End of Session Equipment Utilized During Treatment: Gait belt Activity Tolerance: Patient tolerated treatment well Patient left: in chair;with chair alarm set;with family/visitor present;with SCD's reapplied Nurse Communication: Mobility status PT Visit Diagnosis: Pain;Muscle weakness (generalized) (M62.81);Difficulty in walking, not elsewhere classified (R26.2) Pain - Right/Left: Left Pain - part of body: Knee     Time: 3276-1470 PT Time Calculation (min) (ACUTE ONLY): 29 min  Charges:  $Gait Training: 8-22 mins $Therapeutic Exercise: 8-22 mins                    G Codes:  Functional Assessment Tool Used: AM-PAC 6 Clicks Basic Mobility Functional Limitation: Mobility: Walking and moving around Mobility: Walking and Moving Around Current Status (L2957): At least 60 percent but less than 80 percent impaired, limited or restricted Mobility: Walking and Moving Around Goal Status [phone]): At least 40 percent but less than 60 percent impaired, limited or restricted    [name], PT, DPT [phone]    Catherine Burgess 12/09/2016, 11:28 AM

## 2016-12-09 NOTE — Progress Notes (Signed)
Patient is medically stable for D/C to [facility] today. Per [facility] liaison patient can come today to room 607. RN will call report to RN Theressa Stamps at [phone] and arrange EMS for transport. Clinical Education officer, museum (CSW) sent D/C orders to [facility] via [facility]. Patient is aware of above. CSW contacted patient's son [name] and made him aware of above. Please reconsult if future social work needs arise. CSW signing off.   [employer], LCSW [phone]

## 2016-12-09 NOTE — Progress Notes (Signed)
   Subjective: 2 Days Post-Op Procedure(s) (LRB): TOTAL KNEE REVISION (Left) Patient reports pain as 4 on 0-10 scale.  Patient states the pain is much better slept very well during the night. Patient is well, and has had no acute complaints or problems We will start therapy today.  Plan is to go Rehab after hospital stay. Patient states that she feels that she is ready to go to rehabilitation today. no nausea and no vomiting Patient denies any chest pains or shortness of breath. Objective: Vital signs in last 24 hours: Temp:  [97.8 F (36.6 C)-98.9 F (37.2 C)] 98.9 F (37.2 C) (10/31 0544) Pulse Rate:  [70-87] 80 (10/31 0544) Resp:  [16-19] 18 (10/31 0544) BP: (105-142)/(48-59) 105/53 (10/31 0544) SpO2:  [94 %-100 %] 94 % (10/31 0544) well approximated incision Heels are non tender and elevated off the bed using rolled towels Intake/Output from previous day: 10/30 0701 - 10/31 0700 In: 2921.7 [P.O.:1200; I.V.:1321.7; IV Piggyback:400] Out: 80 [Urine:5; Drains:75] Intake/Output this shift: No intake/output data recorded.   Recent Labs  12/08/16 0508 12/09/16 0510  HGB 11.3* 10.4*    Recent Labs  12/08/16 0508 12/09/16 0510  WBC 10.0 9.2  RBC 4.30 3.88  HCT 35.3 31.6*  PLT 271 270    Recent Labs  12/08/16 0508 12/09/16 0510  NA 138 141  K 3.3* 4.0  CL 105 108  CO2 27 29  BUN 16 18  CREATININE 0.98 0.82  GLUCOSE 150* 137*  CALCIUM 8.4* 8.2*   No results for input(s): LABPT, INR in the last 72 hours.  EXAM General - Patient is Alert, Appropriate and Oriented Extremity - Neurologically intact Neurovascular intact Sensation intact distally Intact pulses distally Dorsiflexion/Plantar flexion intact No cellulitis present Compartment soft Dressing - dressing C/D/I Motor Function - intact, moving foot and toes well on exam.    Past Medical History:  Diagnosis Date  . Degenerative joint disease   . Hyperlipidemia   . Hypertension   . Osteoarthrosis    . Reflux esophagitis     Assessment/Plan: 2 Days Post-Op Procedure(s) (LRB): TOTAL KNEE REVISION (Left) Active Problems:   S/P total knee arthroplasty  Estimated body mass index is 25.11 kg/m as calculated from the following:   Height as of this encounter: 5' 3.5" (1.613 m).   Weight as of this encounter: 65.3 kg (144 lb). Up with therapy Discharge to SNF  Labs: Were reviewed and acceptable DVT Prophylaxis - Lovenox, Foot Pumps and TED hose Weight-Bearing as tolerated to left leg Patient needs a bowel movement prior to being discharged today. Hemovac was discontinued on today's visit Please wash operative leg and apply TED stockings. Please change dressing prior to patient being discharged  Keldon Lassen R. Kemp Hooks 12/09/2016, 7:03 AM

## 2016-12-09 NOTE — Progress Notes (Signed)
Pt ready for d/c to SNF today per MD. Pt did have BM after dulcolax suppository. Dressing was changed per order. Report was called to [place] at [facility]. PIV removed, all belongings will be sent with pt. Pt will be transported to facility via EMS. EMS set up.  [place], Jerry Caras

## 2016-12-11 LAB — BODY FLUID CULTURE
CULTURE: NO GROWTH
Culture: NO GROWTH
Gram Stain: NONE SEEN
Gram Stain: NONE SEEN

## 2016-12-13 LAB — AEROBIC/ANAEROBIC CULTURE (SURGICAL/DEEP WOUND)
GRAM STAIN: NONE SEEN
GRAM STAIN: NONE SEEN

## 2016-12-13 LAB — AEROBIC/ANAEROBIC CULTURE W GRAM STAIN (SURGICAL/DEEP WOUND)
Culture: NO GROWTH
Culture: NO GROWTH

## 2017-01-08 ENCOUNTER — Telehealth: Payer: Self-pay | Admitting: Family Medicine

## 2017-01-08 ENCOUNTER — Ambulatory Visit (INDEPENDENT_AMBULATORY_CARE_PROVIDER_SITE_OTHER): Payer: Medicare Other | Admitting: Family Medicine

## 2017-01-08 ENCOUNTER — Encounter: Payer: Self-pay | Admitting: Family Medicine

## 2017-01-08 VITALS — BP 120/60 | HR 84 | Resp 14 | Wt 131.0 lb

## 2017-01-08 DIAGNOSIS — I10 Essential (primary) hypertension: Secondary | ICD-10-CM | POA: Diagnosis not present

## 2017-01-08 DIAGNOSIS — Z96652 Presence of left artificial knee joint: Secondary | ICD-10-CM

## 2017-01-08 DIAGNOSIS — M5090 Cervical disc disorder, unspecified, unspecified cervical region: Secondary | ICD-10-CM | POA: Diagnosis not present

## 2017-01-08 DIAGNOSIS — E7800 Pure hypercholesterolemia, unspecified: Secondary | ICD-10-CM | POA: Diagnosis not present

## 2017-01-08 DIAGNOSIS — R739 Hyperglycemia, unspecified: Secondary | ICD-10-CM | POA: Diagnosis not present

## 2017-01-08 DIAGNOSIS — K219 Gastro-esophageal reflux disease without esophagitis: Secondary | ICD-10-CM | POA: Diagnosis not present

## 2017-01-08 DIAGNOSIS — M170 Bilateral primary osteoarthritis of knee: Secondary | ICD-10-CM | POA: Diagnosis not present

## 2017-01-08 NOTE — Patient Instructions (Addendum)
DASH Eating Plan DASH stands for "Dietary Approaches to Stop Hypertension." The DASH eating plan is a healthy eating plan that has been shown to reduce high blood pressure (hypertension). It may also reduce your risk for type 2 diabetes, heart disease, and stroke. The DASH eating plan may also help with weight loss. What are tips for following this plan? General guidelines  Avoid eating more than 2,300 mg (milligrams) of salt (sodium) a day. If you have hypertension, you may need to reduce your sodium intake to 1,500 mg a day.  Limit alcohol intake to no more than 1 drink a day for nonpregnant women and 2 drinks a day for men. One drink equals 12 oz of beer, 5 oz of wine, or 1 oz of hard liquor.  Work with your health care provider to maintain a healthy body weight or to lose weight. Ask what an ideal weight is for you.  Get at least 30 minutes of exercise that causes your heart to beat faster (aerobic exercise) most days of the week. Activities may include walking, swimming, or biking.  Work with your health care provider or diet and nutrition specialist (dietitian) to adjust your eating plan to your individual calorie needs. Reading food labels  Check food labels for the amount of sodium per serving. Choose foods with less than 5 percent of the Daily Value of sodium. Generally, foods with less than 300 mg of sodium per serving fit into this eating plan.  To find whole grains, look for the word "whole" as the first word in the ingredient list. Shopping  Buy products labeled as "low-sodium" or "no salt added."  Buy fresh foods. Avoid canned foods and premade or frozen meals. Cooking  Avoid adding salt when cooking. Use salt-free seasonings or herbs instead of table salt or sea salt. Check with your health care provider or pharmacist before using salt substitutes.  Do not fry foods. Cook foods using healthy methods such as baking, boiling, grilling, and broiling instead.  Cook with  heart-healthy oils, such as olive, canola, soybean, or sunflower oil. Meal planning   Eat a balanced diet that includes: ? 5 or more servings of fruits and vegetables each day. At each meal, try to fill half of your plate with fruits and vegetables. ? Up to 6-8 servings of whole grains each day. ? Less than 6 oz of lean meat, poultry, or fish each day. A 3-oz serving of meat is about the same size as a deck of cards. One egg equals 1 oz. ? 2 servings of low-fat dairy each day. ? A serving of nuts, seeds, or beans 5 times each week. ? Heart-healthy fats. Healthy fats called Omega-3 fatty acids are found in foods such as flaxseeds and coldwater fish, like sardines, salmon, and mackerel.  Limit how much you eat of the following: ? Canned or prepackaged foods. ? Food that is high in trans fat, such as fried foods. ? Food that is high in saturated fat, such as fatty meat. ? Sweets, desserts, sugary drinks, and other foods with added sugar. ? Full-fat dairy products.  Do not salt foods before eating.  Try to eat at least 2 vegetarian meals each week.  Eat more home-cooked food and less restaurant, buffet, and fast food.  When eating at a restaurant, ask that your food be prepared with less salt or no salt, if possible. What foods are recommended? The items listed may not be a complete list. Talk with your dietitian about what   dietary choices are best for you. Grains Whole-grain or whole-wheat bread. Whole-grain or whole-wheat pasta. Brown rice. Oatmeal. Quinoa. Bulgur. Whole-grain and low-sodium cereals. Pita bread. Low-fat, low-sodium crackers. Whole-wheat flour tortillas. Vegetables Fresh or frozen vegetables (raw, steamed, roasted, or grilled). Low-sodium or reduced-sodium tomato and vegetable juice. Low-sodium or reduced-sodium tomato sauce and tomato paste. Low-sodium or reduced-sodium canned vegetables. Fruits All fresh, dried, or frozen fruit. Canned fruit in natural juice (without  added sugar). Meat and other protein foods Skinless chicken or turkey. Ground chicken or turkey. Pork with fat trimmed off. Fish and seafood. Egg whites. Dried beans, peas, or lentils. Unsalted nuts, nut butters, and seeds. Unsalted canned beans. Lean cuts of beef with fat trimmed off. Low-sodium, lean deli meat. Dairy Low-fat (1%) or fat-free (skim) milk. Fat-free, low-fat, or reduced-fat cheeses. Nonfat, low-sodium ricotta or cottage cheese. Low-fat or nonfat yogurt. Low-fat, low-sodium cheese. Fats and oils Soft margarine without trans fats. Vegetable oil. Low-fat, reduced-fat, or light mayonnaise and salad dressings (reduced-sodium). Canola, safflower, olive, soybean, and sunflower oils. Avocado. Seasoning and other foods Herbs. Spices. Seasoning mixes without salt. Unsalted popcorn and pretzels. Fat-free sweets. What foods are not recommended? The items listed may not be a complete list. Talk with your dietitian about what dietary choices are best for you. Grains Baked goods made with fat, such as croissants, muffins, or some breads. Dry pasta or rice meal packs. Vegetables Creamed or fried vegetables. Vegetables in a cheese sauce. Regular canned vegetables (not low-sodium or reduced-sodium). Regular canned tomato sauce and paste (not low-sodium or reduced-sodium). Regular tomato and vegetable juice (not low-sodium or reduced-sodium). Pickles. Olives. Fruits Canned fruit in a light or heavy syrup. Fried fruit. Fruit in cream or butter sauce. Meat and other protein foods Fatty cuts of meat. Ribs. Fried meat. Bacon. Sausage. Bologna and other processed lunch meats. Salami. Fatback. Hotdogs. Bratwurst. Salted nuts and seeds. Canned beans with added salt. Canned or smoked fish. Whole eggs or egg yolks. Chicken or turkey with skin. Dairy Whole or 2% milk, cream, and half-and-half. Whole or full-fat cream cheese. Whole-fat or sweetened yogurt. Full-fat cheese. Nondairy creamers. Whipped toppings.  Processed cheese and cheese spreads. Fats and oils Butter. Stick margarine. Lard. Shortening. Ghee. Bacon fat. Tropical oils, such as coconut, palm kernel, or palm oil. Seasoning and other foods Salted popcorn and pretzels. Onion salt, garlic salt, seasoned salt, table salt, and sea salt. Worcestershire sauce. Tartar sauce. Barbecue sauce. Teriyaki sauce. Soy sauce, including reduced-sodium. Steak sauce. Canned and packaged gravies. Fish sauce. Oyster sauce. Cocktail sauce. Horseradish that you find on the shelf. Ketchup. Mustard. Meat flavorings and tenderizers. Bouillon cubes. Hot sauce and Tabasco sauce. Premade or packaged marinades. Premade or packaged taco seasonings. Relishes. Regular salad dressings. Where to find more information:  National Heart, Lung, and Blood Institute: www.nhlbi.nih.gov  American Heart Association: www.heart.org Summary  The DASH eating plan is a healthy eating plan that has been shown to reduce high blood pressure (hypertension). It may also reduce your risk for type 2 diabetes, heart disease, and stroke.  With the DASH eating plan, you should limit salt (sodium) intake to 2,300 mg a day. If you have hypertension, you may need to reduce your sodium intake to 1,500 mg a day.  When on the DASH eating plan, aim to eat more fresh fruits and vegetables, whole grains, lean proteins, low-fat dairy, and heart-healthy fats.  Work with your health care provider or diet and nutrition specialist (dietitian) to adjust your eating plan to your individual   calorie needs. This information is not intended to replace advice given to you by your health care provider. Make sure you discuss any questions you have with your health care provider. Document Released: 01/15/2011 Document Revised: 01/20/2016 Document Reviewed: 01/20/2016 Elsevier Interactive Patient Education  2017 [place] called impaired glucose tolerance or impaired fasting  glucose-is a condition that causes blood sugar (blood glucose) levels to be higher than normal. Following a healthy diet can help to keep prediabetes under control. It can also help to lower the risk of type 2 diabetes and heart disease, which are increased in people who have prediabetes. Along with regular exercise, a healthy diet:  Promotes weight loss.  Helps to control blood sugar levels.  Helps to improve the way that the body uses insulin.  What do I need to know about this eating plan?  Use the glycemic index (GI) to plan your meals. The index tells you how quickly a food will raise your blood sugar. Choose low-GI foods. These foods take a longer time to raise blood sugar.  Pay close attention to the amount of carbohydrates in the food that you eat. Carbohydrates increase blood sugar levels.  Keep track of how many calories you take in. Eating the right amount of calories will help you to achieve a healthy weight. Losing about 7 percent of your starting weight can help to prevent type 2 diabetes.  You may want to follow a Mediterranean diet. This diet includes a lot of vegetables, lean meats or fish, whole grains, fruits, and healthy oils and fats. What foods can I eat? Grains Whole grains, such as whole-wheat or whole-grain breads, crackers, cereals, and pasta. Unsweetened oatmeal. Bulgur. Barley. Quinoa. Brown rice. Corn or whole-wheat flour tortillas or taco shells. Vegetables Lettuce. Spinach. Peas. Beets. Cauliflower. Cabbage. Broccoli. Carrots. Tomatoes. Squash. Eggplant. Herbs. Peppers. Onions. Cucumbers. Brussels sprouts. Fruits Berries. Bananas. Apples. Oranges. Grapes. Papaya. Mango. Pomegranate. Kiwi. Grapefruit. Cherries. Meats and Other Protein Sources Seafood. Lean meats, such as chicken and [place] or lean cuts of pork and beef. Tofu. Eggs. Nuts. Beans. Dairy Low-fat or fat-free dairy products, such as yogurt, cottage cheese, and cheese. Beverages Water. Tea. Coffee.  Sugar-free or diet soda. Seltzer water. Milk. Milk alternatives, such as soy or almond milk. Condiments Mustard. Relish. Low-fat, low-sugar ketchup. Low-fat, low-sugar barbecue sauce. Low-fat or fat-free mayonnaise. Sweets and Desserts Sugar-free or low-fat pudding. Sugar-free or low-fat ice cream and other frozen treats. Fats and Oils Avocado. Walnuts. Olive oil. The items listed above may not be a complete list of recommended foods or beverages. Contact your dietitian for more options. What foods are not recommended? Grains Refined white flour and flour products, such as bread, pasta, snack foods, and cereals. Beverages Sweetened drinks, such as sweet iced tea and soda. Sweets and Desserts Baked goods, such as cake, cupcakes, pastries, cookies, and cheesecake. The items listed above may not be a complete list of foods and beverages to avoid. Contact your dietitian for more information. This information is not intended to replace advice given to you by your health care provider. Make sure you discuss any questions you have with your health care provider. Document Released: 06/12/2014 Document Revised: 07/04/2015 Document Reviewed: 02/21/2014 Elsevier Interactive Patient Education  2017 [employer].

## 2017-01-08 NOTE — Progress Notes (Signed)
Name: Catherine Burgess   MRN: [id number]    DOB: 10/11/1935   Date:01/08/2017       Progress Note  Subjective  Chief Complaint  Chief Complaint  Patient presents with  . Medication Refill    HPI   HTN : BP is at goal today, on Norvasc and Losartan , continue bp medication, no chest pain or palpitation, no shortness of breath.  Hyperlipidemia: taking Atorvastatin, reviewed labs done 10/2015, doing well, no myalgias . We will recheck it today   OA: both knees, has daily pain, currently on Tylenol prn and Gabapentin. Sees Dr. Jovita Kussmaul s/p total left knee replacement, she has decrease rom of left knee and pain has been getting progressively worse.  Had total knee revision of the LEFT knee on 12/07/2016 and is doing well over all.  Pt coming to her home twice a week.  Pain level now is a 5/10, nagging pain mostly in the left knee.  She was placed on Lovenox s/p procedure - she has not been use the lovenox since hospital discharge, says she prefers to take aspirin if possible - denies LE erythema, and states the her left knee is swollen but has been improving since procedure; no chest pain or shortness of breath.  Sees Dr. Marry Guan on 01/20/2017.  Has 1 Oxycodone left from her surgery, but says she prefers tramadol for her pain, and has switched back to taking this now - does not need refill today.  Also taking Meloxicam daily and doing well on this medications.   Hyperglycemia: she denies polyphagia, polyuria, she states she has occasional polyuria. She is still likes bread, but trying to avoid bread and desserts.  Her cousin has been helping her with meals since her surgery and they have been having lean meats and vegetables most days.  Last hgbA1C was 6.1% 10/13/2016, too soon to recheck today. BMP from 12/09/16 shows hyperglycemia; BUN/Creatinine and potassium were WNL.  Urinary urgency: She is doing well using her walker and getting to the bathroom in time. She says every now and then she  has some incontinence - maybe 2 times a month.  Neck pain: doing better, no radiculitis, taking gabapentin twice daily, denies mental fogginess or fatigue.  She denies any weakness.  GERD: no symptoms at this time, denies regurgitation or heartburn, she has not been trying to wean self off by alternating with ranitidine and protonix. Since hospitalization she has only been taking protonix, will resume alternating regimen today.  Syncope: admitted to [facility] after syncopal episode 07/2016; had EEG on 07/24/2016 and is was normal. No other episodes since.  Patient Active Problem List   Diagnosis Date Noted  . S/P total knee arthroplasty 12/07/2016  . History of anemia 10/13/2016  . Syncope 07/23/2016  . Malignant essential hypertension 07/23/2016  . Hypokalemia 07/23/2016  . Abnormal CT of brain 07/23/2016  . Vitamin D deficiency 10/28/2015  . Post menopausal syndrome 10/28/2015  . OP (osteoporosis) 10/28/2015  . Cervical disc disease 10/28/2015  . H/O total knee replacement 01/20/2015  . Vaginal atrophy 09/26/2014  . Osteoarthritis of both knees 07/31/2014  . GERD without esophagitis 07/31/2014  . HLD (hyperlipidemia) 07/31/2014  . Hypertension, benign 07/31/2014  . History of artificial joint 05/18/2013    Past Surgical History:  Procedure Laterality Date  . CATARACT EXTRACTION, BILATERAL    . HIP PINNING Left 1956  . REPLACEMENT TOTAL KNEE Left 11/12/2009  . TOTAL KNEE REVISION Left 12/07/2016   Procedure: TOTAL KNEE REVISION;  Surgeon: Dereck Leep, MD;  Location: [facility] ORS;  Service: Orthopedics;  Laterality: Left;    Family History  Problem Relation Age of Onset  . Emphysema Father        Smoker  . CAD Mother   . Stroke Mother   . Diabetes Brother   . Seizures Brother   . Cancer Brother   . Diabetes Brother     Social History   Socioeconomic History  . Marital status: Single    Spouse name: Not on file  . Number of children: Not on file  . Years of  education: Not on file  . Highest education level: Not on file  Social Needs  . Financial resource strain: Not on file  . Food insecurity - worry: Not on file  . Food insecurity - inability: Not on file  . Transportation needs - medical: Not on file  . Transportation needs - non-medical: Not on file  Occupational History  . Not on file  Tobacco Use  . Smoking status: Former Smoker    Last attempt to quit: 1966    Years since quitting: 52.9  . Smokeless tobacco: Never Used  Substance and Sexual Activity  . Alcohol use: No    Alcohol/week: 0.0 oz  . Drug use: No  . Sexual activity: Not Currently  Other Topics Concern  . Not on file  Social History Narrative  . Not on file     Current Outpatient Medications:  .  acetaminophen (TYLENOL) 500 MG tablet, Take 1 tablet (500 mg total) by mouth 2 (two) times daily., Disp: 180 tablet, Rfl: 0 .  amLODipine (NORVASC) 5 MG tablet, Take 1 tablet (5 mg total) by mouth daily., Disp: 90 tablet, Rfl: 1 .  atorvastatin (LIPITOR) 40 MG tablet, take 1 tablet by mouth once daily, Disp: 90 tablet, Rfl: 0 .  Calcium-Vitamin D 600-200 MG-UNIT tablet, Take 1 tablet by mouth daily. , Disp: , Rfl:  .  enoxaparin (LOVENOX) 40 MG/0.4ML injection, Inject 0.4 mLs (40 mg total) into the skin daily., Disp: 14 Syringe, Rfl: 0 .  gabapentin (NEURONTIN) 300 MG capsule, take 1 capsule by mouth two times a day (Patient taking differently: Take 300 mg by mouth 2 (two) times daily. ), Disp: 180 capsule, Rfl: 1 .  losartan (COZAAR) 50 MG tablet, Take 1 tablet (50 mg total) by mouth daily., Disp: 90 tablet, Rfl: 1 .  meloxicam (MOBIC) 15 MG tablet, Take 1 tablet by mouth daily., Disp: , Rfl: 1 .  oxyCODONE (OXY IR/ROXICODONE) 5 MG immediate release tablet, Take 1 tablet (5 mg total) by mouth every 3 (three) hours as needed for moderate pain ((score 4 to 6))., Disp: 30 tablet, Rfl: 0 .  pantoprazole (PROTONIX) 40 MG tablet, Take 1 tablet (40 mg total) by mouth daily., Disp:  90 tablet, Rfl: 1 .  ranitidine (ZANTAC) 150 MG tablet, Take 150 mg by mouth 2 (two) times daily., Disp: , Rfl:  .  traMADol (ULTRAM) 50 MG tablet, Take 1-2 tablets (50-100 mg total) by mouth every 4 (four) hours as needed for moderate pain., Disp: 30 tablet, Rfl: 0 .  traMADol-acetaminophen (ULTRACET) 37.5-325 MG tablet, Take 1 tablet by mouth every 8 (eight) hours as needed. (Patient not taking: Reported on 12/07/2016), Disp: 90 tablet, Rfl: 0  Allergies  Allergen Reactions  . Lovastatin Other (See Comments)    chest  . Pravastatin     Other reaction(s): Muscle Pain chest  . Rosuvastatin     Other  reaction(s): Muscle Pain chest Other reaction(s): Muscle Pain chest  . Statins     Other reaction(s): Muscle Pain chest     ROS  Constitutional: Negative for fever or weight change.  Respiratory: Negative for cough and shortness of breath.   Cardiovascular: Negative for chest pain or palpitations.  Gastrointestinal: Negative for abdominal pain, no bowel changes.  Musculoskeletal: See HPI Skin: Negative for rash.  Neurological: Negative for dizziness or headache.  No other specific complaints in a complete review of systems (except as listed in HPI above).  Objective  Vitals:   01/08/17 0846  BP: 120/60  Pulse: 84  Resp: 14  SpO2: 98%  Weight: 131 lb (59.4 kg)   Body mass index is 22.84 kg/m.  Physical Exam Constitutional: Patient appears well-developed and well-nourished. No distress.  HENT: Head: Normocephalic and atraumatic. Neck: Normal range of motion. Neck supple. No JVD present. No thyromegaly present.  Cardiovascular: Normal rate, regular rhythm and normal heart sounds.  No murmur heard. No pitting BLE edema. Pulmonary/Chest: Effort normal and breath sounds normal. No respiratory distress. Abdominal: Soft. Bowel sounds are normal, no distension. There is no tenderness. no masses Musculoskeletal: LEFT knee exhibits moderate swelling and mild tenderness on medial  aspect of incision; limited AROM of LLE.  No gross deformities. No bilateral calf tenderness or erythema; no bilateral thigh tenderness or erythema  Neurological: she is alert and oriented to person, place, and time. No cranial nerve deficit. Coordination, balance, strength, speech and gait are appropriate - ambulating with walker for the time being and is steady without assistance.  Skin: Skin is warm and dry. Mild erythema to LEFT knee, large vertical surgical incision starting above anterior knee and stopping just below the knee; edges are well-approximated with no exudate, steri-strips remain intact.  Psychiatric: Patient has a normal mood and affect. behavior is normal. Judgment and thought content normal.  Recent Results (from the past 2160 hour(s))  COMPLETE METABOLIC PANEL WITH GFR     Status: Abnormal   Collection Time: 10/13/16 12:08 PM  Result Value Ref Range   Sodium 141 135 - 146 mmol/L   Potassium 4.6 3.5 - 5.3 mmol/L   Chloride 104 98 - 110 mmol/L   CO2 32 20 - 32 mmol/L    Comment: ** Please note change in reference range(s). **      Glucose, Bld 93 65 - 99 mg/dL   BUN 20 7 - 25 mg/dL   Creat 0.68 0.60 - 0.93 mg/dL    Comment:   For patients > or = 79 years of age: The upper reference limit for Creatinine is approximately 13% higher for people identified as African-American.      Total Bilirubin 0.4 0.2 - 1.2 mg/dL   Alkaline Phosphatase 144 (H) 33 - 130 U/L   AST 24 10 - 35 U/L   ALT 22 6 - 29 U/L   Total Protein 6.3 6.1 - 8.1 g/dL   Albumin 3.9 3.6 - 5.1 g/dL   Calcium 9.1 8.6 - 10.4 mg/dL   GFR, Est African American >89 >=60 mL/min   GFR, Est Non African American 84 >=60 mL/min  CBC with Differential/Platelet     Status: Abnormal   Collection Time: 10/13/16 12:08 PM  Result Value Ref Range   WBC 7.2 3.8 - 10.8 K/uL   RBC 4.79 3.80 - 5.10 MIL/uL   Hemoglobin 12.4 11.7 - 15.5 g/dL   HCT 40.2 35.0 - 45.0 %   MCV 83.9 80.0 - 100.0  fL   MCH 25.9 (L) 27.0 - 33.0  pg   MCHC 30.8 (L) 32.0 - 36.0 g/dL   RDW 16.6 (H) 11.0 - 15.0 %   Platelets 364 140 - 400 K/uL   MPV 9.3 7.5 - 12.5 fL   Neutro Abs 4,824 1,500 - 7,800 cells/uL   Lymphs Abs 1,800 850 - 3,900 cells/uL   Monocytes Absolute 432 200 - 950 cells/uL   Eosinophils Absolute 144 15 - 500 cells/uL   Basophils Absolute 0 0 - 200 cells/uL   Neutrophils Relative % 67 %   Lymphocytes Relative 25 %   Monocytes Relative 6 %   Eosinophils Relative 2 %   Basophils Relative 0 %   Smear Review Criteria for review not met   Hemoglobin A1c     Status: Abnormal   Collection Time: 10/13/16 12:08 PM  Result Value Ref Range   Hgb A1c MFr Bld 6.1 (H) <5.7 %    Comment:   For someone without known diabetes, a hemoglobin A1c value between 5.7% and 6.4% is consistent with prediabetes and should be confirmed with a follow-up test.   For someone with known diabetes, a value <7% indicates that their diabetes is well controlled. A1c targets should be individualized based on duration of diabetes, age, co-morbid conditions and other considerations.   This assay result is consistent with an increased risk of diabetes.   Currently, no consensus exists regarding use of hemoglobin A1c for diagnosis of diabetes in children.      Mean Plasma Glucose 128 mg/dL  Lipid panel     Status: Abnormal   Collection Time: 10/13/16 12:08 PM  Result Value Ref Range   Cholesterol 111 <200 mg/dL   Triglycerides 79 <150 mg/dL   HDL 46 (L) >50 mg/dL   Total CHOL/HDL Ratio 2.4 <5.0 Ratio   VLDL 16 <30 mg/dL   LDL Cholesterol 49 <100 mg/dL  Insulin, fasting     Status: None   Collection Time: 10/13/16 12:08 PM  Result Value Ref Range   Insulin fasting, serum 6.6 2.0 - 19.6 uIU/mL    Comment:   This insulin assay shows strong cross-reactivity for some insulin analogs (lispro, aspart, and glargine) and much lower cross-reactivity with others (detemir, glulisine).   Stimulated Insulin reference intervals were established  using the Siemens Immulite assay. These values are provided for general guidance only.   VITAMIN D 25 Hydroxy (Vit-D Deficiency, Fractures)     Status: None   Collection Time: 10/13/16 12:08 PM  Result Value Ref Range   Vit D, 25-Hydroxy 32 30 - 100 ng/mL    Comment: Vitamin D Status           25-OH Vitamin D        Deficiency                <20 ng/mL        Insufficiency         20 - 29 ng/mL        Optimal             > or = 30 ng/mL   For 25-OH Vitamin D testing on patients on D2-supplementation and patients for whom quantitation of D2 and D3 fractions is required, the QuestAssureD 25-OH VIT D, (D2,D3), LC/MS/MS is recommended: order code [id number] (patients > 2 yrs).   C-reactive protein     Status: None   Collection Time: 11/24/16 10:41 AM  Result Value Ref Range   CRP <0.8 <  1.0 mg/dL    Comment: Performed at [facility], [place] [address] [place], [place] [address]  Sedimentation rate     Status: None   Collection Time: 11/24/16 10:41 AM  Result Value Ref Range   Sed Rate 17 0 - 30 mm/hr  Surgical pcr screen     Status: None   Collection Time: 11/24/16 10:41 AM  Result Value Ref Range   MRSA, PCR NEGATIVE NEGATIVE   Staphylococcus aureus NEGATIVE NEGATIVE    Comment: (NOTE) The Xpert SA Assay (FDA approved for NASAL specimens in patients 9 years of age and older), is one component of a comprehensive surveillance program. It is not intended to diagnose infection nor to guide or monitor treatment.   APTT     Status: None   Collection Time: 11/24/16 10:41 AM  Result Value Ref Range   aPTT 31 24 - 36 seconds  CBC     Status: Abnormal   Collection Time: 11/24/16 10:41 AM  Result Value Ref Range   WBC 7.9 3.6 - 11.0 K/uL   RBC 4.90 3.80 - 5.20 MIL/uL   Hemoglobin 12.9 12.0 - 16.0 g/dL   HCT 39.7 35.0 - 47.0 %   MCV 81.0 80.0 - 100.0 fL   MCH 26.3 26.0 - 34.0 pg   MCHC 32.5 32.0 - 36.0 g/dL   RDW 17.7 (H) 11.5 - 14.5 %   Platelets 326 150 - 440 K/uL   Comprehensive metabolic panel     Status: Abnormal   Collection Time: 11/24/16 10:41 AM  Result Value Ref Range   Sodium 142 135 - 145 mmol/L   Potassium 3.5 3.5 - 5.1 mmol/L   Chloride 105 101 - 111 mmol/L   CO2 28 22 - 32 mmol/L   Glucose, Bld 90 65 - 99 mg/dL   BUN 21 (H) 6 - 20 mg/dL   Creatinine, Ser 0.89 0.44 - 1.00 mg/dL   Calcium 9.4 8.9 - 10.3 mg/dL   Total Protein 7.3 6.5 - 8.1 g/dL   Albumin 3.8 3.5 - 5.0 g/dL   AST 27 15 - 41 U/L   ALT 26 14 - 54 U/L   Alkaline Phosphatase 131 (H) 38 - 126 U/L   Total Bilirubin 0.6 0.3 - 1.2 mg/dL   GFR calc non Af Amer >60 >60 mL/min   GFR calc Af Amer >60 >60 mL/min    Comment: (NOTE) The eGFR has been calculated using the CKD EPI equation. This calculation has not been validated in all clinical situations. eGFR's persistently <60 mL/min signify possible Chronic Kidney Disease.    Anion gap 9 5 - 15  Protime-INR     Status: None   Collection Time: 11/24/16 10:41 AM  Result Value Ref Range   Prothrombin Time 12.5 11.4 - 15.2 seconds   INR 0.94   Type and screen Order type and screen if day of surgery is less than 15 days from draw of preadmission visit or order morning of surgery if day of surgery is greater than 6 days from preadmission visit.     Status: None   Collection Time: 11/24/16 10:41 AM  Result Value Ref Range   ABO/RH(D) O POS    Antibody Screen NEG    Sample Expiration 12/08/2016    Extend sample reason NO TRANSFUSIONS OR PREGNANCY IN THE PAST 3 MONTHS   Urinalysis, Routine w reflex microscopic     Status: Abnormal   Collection Time: 11/24/16 10:41 AM  Result Value Ref Range  Color, Urine STRAW (A) YELLOW   APPearance CLEAR (A) CLEAR   Specific Gravity, Urine 1.005 1.005 - 1.030   pH 7.0 5.0 - 8.0   Glucose, UA NEGATIVE NEGATIVE mg/dL   Hgb urine dipstick NEGATIVE NEGATIVE   Bilirubin Urine NEGATIVE NEGATIVE   Ketones, ur NEGATIVE NEGATIVE mg/dL   Protein, ur NEGATIVE NEGATIVE mg/dL   Nitrite NEGATIVE  NEGATIVE   Leukocytes, UA NEGATIVE NEGATIVE  Urine culture     Status: None   Collection Time: 11/24/16 10:41 AM  Result Value Ref Range   Specimen Description URINE, RANDOM    Special Requests Normal    Culture      NO GROWTH Performed at [facility] [address], [place], [place] [address]    Report Status 11/25/2016 FINAL   ABO/Rh     Status: None   Collection Time: 12/07/16 10:14 AM  Result Value Ref Range   ABO/RH(D) O POS   Body fluid culture     Status: None   Collection Time: 12/07/16  1:25 PM  Result Value Ref Range   Specimen Description KNEE LEFT    Special Requests NONE    Gram Stain NO ORGANISMS SEEN RARE WBC     Culture      NO GROWTH 3 DAYS Performed at [facility], [place] [address], [place], [place] [address]    Report Status 12/11/2016 FINAL   Surgical pathology     Status: None   Collection Time: 12/07/16  1:35 PM  Result Value Ref Range   SURGICAL PATHOLOGY      Surgical Pathology CASE: [id number] PATIENT: Adrian Saran Hibberd Surgical Pathology Report     SPECIMEN SUBMITTED: A. Synovium tissue, left knee B. Femoral tissue, left knee  CLINICAL HISTORY: None provided  PRE-OPERATIVE DIAGNOSIS: Status post left total knee replacement with loosening of femoral implant and pain  POST-OPERATIVE DIAGNOSIS: Same as pre-op     DIAGNOSIS: A. SYNOVIAL TISSUE, LEFT KNEE; TOTAL KNEE REVISION: - DENSE FIBROSIS WITH CHONDROID METAPLASIA. - EXTENSIVE CALCIUM PYROPHOSPHATE DEPOSITS.  B. TISSUE FROM LEFT FEMUR; TOTAL KNEE REVISION: - FIBRINOUS DEBRIS AND ADJACENT FIBROUS TISSUE WITH FOREIGN BODY REACTION.  Comment: No acute inflammatory infiltrates (neutrophils) are identified in these specimens. Clinical correlation is recommended with regard to the significance of the synovial calcium pyrophosphate deposits.   GROSS DESCRIPTION: A. Labeled: synovial tissue left knee Tissue fragment(s): 1 Size: 5.3 x 1.5 x 1.1 cm Desc  ription: rubbery Tan to purple gray tissue  Representative sections submitted in 1 cassette(s).   B. Labeled: left knee femoral tissue (pathology) Tissue fragment(s): multiple Size: aggregate, 3.5 x 2.6 x 1.1 cm Description: pink-tan rubbery fragments  Representative sections submitted in 1 cassette(s).  Final Diagnosis performed by Bryan Lemma, MD.  Electronically signed 12/09/2016 12:26:50PM    The electronic signature indicates that the named Attending Pathologist has evaluated the specimen  Technical component performed at [facility], [address] [place], [place] [address] Lab: [phone] Dir: Darrick Penna. Evette Doffing, MD  Professional component performed at [facility], [facility], [place], [place],  [address] Lab: [phone] Dir: Dellia Nims. Rubinas, MD    Aerobic/Anaerobic Culture (surgical/deep wound)     Status: None   Collection Time: 12/07/16  2:25 PM  Result Value Ref Range   Specimen Description TISSUE LEFT KNEE    Special Requests NONE    Gram Stain NO ORGANISMS SEEN FEW WBC SEEN FEW RBC     Culture  No growth aerobically or anaerobically. Performed at [facility], [place] [address] [place], [place] [address]    Report Status 12/13/2016 FINAL   Aerobic/Anaerobic Culture (surgical/deep wound)     Status: None   Collection Time: 12/07/16  2:27 PM  Result Value Ref Range   Specimen Description TISSUE LEFT KNEE UNDER THE FEMORAL    Special Requests NONE    Gram Stain      NO ORGANISMS SEEN MODERATE WBC SEEN MODERATE RBC    Culture      No growth aerobically or anaerobically. Performed at [facility], [place] [address], [place], [place] [address]    Report Status 12/13/2016 FINAL   Body fluid culture     Status: None   Collection Time: 12/07/16  2:46 PM  Result Value Ref Range   Specimen Description KNEE LEFT KNEE FEMORAL MEDULLARY CANAL    Special Requests NONE    Gram Stain NO ORGANISMS SEEN RARE WBC  SEEN NO RBC SEEN     Culture      NO GROWTH 3 DAYS Performed at [facility], [place] [address] [place], [place] [address]    Report Status 12/11/2016 FINAL   CBC     Status: Abnormal   Collection Time: 12/08/16  5:08 AM  Result Value Ref Range   WBC 10.0 3.6 - 11.0 K/uL   RBC 4.30 3.80 - 5.20 MIL/uL   Hemoglobin 11.3 (L) 12.0 - 16.0 g/dL   HCT 35.3 35.0 - 47.0 %   MCV 82.1 80.0 - 100.0 fL   MCH 26.3 26.0 - 34.0 pg   MCHC 32.0 32.0 - 36.0 g/dL   RDW 17.3 (H) 11.5 - 14.5 %   Platelets 271 150 - 440 K/uL  Basic metabolic panel     Status: Abnormal   Collection Time: 12/08/16  5:08 AM  Result Value Ref Range   Sodium 138 135 - 145 mmol/L   Potassium 3.3 (L) 3.5 - 5.1 mmol/L   Chloride 105 101 - 111 mmol/L   CO2 27 22 - 32 mmol/L   Glucose, Bld 150 (H) 65 - 99 mg/dL   BUN 16 6 - 20 mg/dL   Creatinine, Ser 0.98 0.44 - 1.00 mg/dL   Calcium 8.4 (L) 8.9 - 10.3 mg/dL   GFR calc non Af Amer 54 (L) >60 mL/min   GFR calc Af Amer >60 >60 mL/min    Comment: (NOTE) The eGFR has been calculated using the CKD EPI equation. This calculation has not been validated in all clinical situations. eGFR's persistently <60 mL/min signify possible Chronic Kidney Disease.    Anion gap 6 5 - 15  CBC     Status: Abnormal   Collection Time: 12/09/16  5:10 AM  Result Value Ref Range   WBC 9.2 3.6 - 11.0 K/uL   RBC 3.88 3.80 - 5.20 MIL/uL   Hemoglobin 10.4 (L) 12.0 - 16.0 g/dL   HCT 31.6 (L) 35.0 - 47.0 %   MCV 81.4 80.0 - 100.0 fL   MCH 26.8 26.0 - 34.0 pg   MCHC 32.9 32.0 - 36.0 g/dL   RDW 17.1 (H) 11.5 - 14.5 %   Platelets 270 150 - 440 K/uL  Basic metabolic panel     Status: Abnormal   Collection Time: 12/09/16  5:10 AM  Result Value Ref Range   Sodium 141 135 - 145 mmol/L   Potassium 4.0 3.5 - 5.1 mmol/L   Chloride 108 101 - 111  mmol/L   CO2 29 22 - 32 mmol/L   Glucose, Bld 137 (H) 65 - 99 mg/dL   BUN 18 6 - 20 mg/dL   Creatinine, Ser 0.82 0.44 - 1.00 mg/dL   Calcium 8.2 (L) 8.9  - 10.3 mg/dL   GFR calc non Af Amer >60 >60 mL/min   GFR calc Af Amer >60 >60 mL/min    Comment: (NOTE) The eGFR has been calculated using the CKD EPI equation. This calculation has not been validated in all clinical situations. eGFR's persistently <60 mL/min signify possible Chronic Kidney Disease.    Anion gap 4 (L) 5 - 15   PHQ2/9: Depression screen [facility] 2/9 10/05/2016 07/01/2016 03/05/2016 12/17/2015 10/28/2015  Decreased Interest 0 0 0 0 0  Down, Depressed, Hopeless 0 0 0 0 0  PHQ - 2 Score 0 0 0 0 0   Fall Risk: Fall Risk  01/08/2017 10/05/2016 07/01/2016 03/05/2016 12/17/2015  Falls in the past year? _0   Number falls in past yr: - - - - -  Injury with Fall? - - - - -  Follow up - - - - -   Assessment & Plan  1. Hypertension, benign Continue medications; DASH diet reinforced  2. GERD without esophagitis Start alternating ranitidine and protonix again; avoid triggers  3. Primary osteoarthritis of both knees Continue medications and ortho follow up  4. Cervical disc disease Continue gabapentin  5. S/P revision of total knee, left Keep follow up with Dr. Marry Guan.  We will call his office today to notify that patient has not been taking lovenox - no signs of DVT/thrombosis at this time.  Will ensure OK for patient to resume 31m daily ASA.  6. Pure hypercholesterolemia Continue atorvastatin and lifestyle modifications.  7. Hyperglycemia Lifestyle modifications discussed in detail; A1C 6.1 and fasting insulin 6.6 in September 2018, no medication changes indicated at this time.

## 2017-01-08 NOTE — Telephone Encounter (Signed)
Called Dr. Marry Guan left a message regarding her Levenox and when she said she would get back in touch with [place].

## 2017-01-08 NOTE — Telephone Encounter (Signed)
Please call Dr. Clydell Hakim office with [facility] and notify that pt has not been using Lovenox since discharge. She would like to resume ASA 81mg  daily - are they ok with this plan? No signs of thrombosis (clot) on examination today. Thanks!

## 2017-01-15 ENCOUNTER — Ambulatory Visit: Payer: Medicare Other | Admitting: Family Medicine

## 2017-01-24 ENCOUNTER — Emergency Department: Payer: Medicare Other

## 2017-01-24 ENCOUNTER — Other Ambulatory Visit: Payer: Self-pay

## 2017-01-24 ENCOUNTER — Encounter: Payer: Self-pay | Admitting: Emergency Medicine

## 2017-01-24 ENCOUNTER — Inpatient Hospital Stay
Admission: EM | Admit: 2017-01-24 | Discharge: 2017-01-24 | DRG: 392 | Disposition: A | Discharge status: Home / Self Care | Payer: Medicare Other | Attending: Surgery | Admitting: Surgery

## 2017-01-24 DIAGNOSIS — E785 Hyperlipidemia, unspecified: Secondary | ICD-10-CM | POA: Diagnosis not present

## 2017-01-24 DIAGNOSIS — K210 Gastro-esophageal reflux disease with esophagitis: Secondary | ICD-10-CM | POA: Diagnosis present

## 2017-01-24 DIAGNOSIS — I10 Essential (primary) hypertension: Secondary | ICD-10-CM | POA: Diagnosis not present

## 2017-01-24 DIAGNOSIS — Z9842 Cataract extraction status, left eye: Secondary | ICD-10-CM | POA: Diagnosis not present

## 2017-01-24 DIAGNOSIS — Z79899 Other long term (current) drug therapy: Secondary | ICD-10-CM

## 2017-01-24 DIAGNOSIS — Z87891 Personal history of nicotine dependence: Secondary | ICD-10-CM

## 2017-01-24 DIAGNOSIS — Z9841 Cataract extraction status, right eye: Secondary | ICD-10-CM | POA: Diagnosis not present

## 2017-01-24 DIAGNOSIS — R109 Unspecified abdominal pain: Secondary | ICD-10-CM | POA: Diagnosis not present

## 2017-01-24 DIAGNOSIS — K562 Volvulus: Secondary | ICD-10-CM | POA: Insufficient documentation

## 2017-01-24 DIAGNOSIS — Z96652 Presence of left artificial knee joint: Secondary | ICD-10-CM | POA: Diagnosis not present

## 2017-01-24 LAB — URINALYSIS, COMPLETE (UACMP) WITH MICROSCOPIC
BACTERIA UA: NONE SEEN
BILIRUBIN URINE: NEGATIVE
GLUCOSE, UA: NEGATIVE mg/dL
HGB URINE DIPSTICK: NEGATIVE
KETONES UR: NEGATIVE mg/dL
LEUKOCYTES UA: NEGATIVE
NITRITE: NEGATIVE
PH: 6 (ref 5.0–8.0)
Protein, ur: NEGATIVE mg/dL
SPECIFIC GRAVITY, URINE: 1.006 (ref 1.005–1.030)

## 2017-01-24 LAB — LIPASE, BLOOD: Lipase: 30 U/L (ref 11–51)

## 2017-01-24 LAB — COMPREHENSIVE METABOLIC PANEL
ALK PHOS: 133 U/L — AB (ref 38–126)
ALT: 18 U/L (ref 14–54)
AST: 24 U/L (ref 15–41)
Albumin: 3.7 g/dL (ref 3.5–5.0)
Anion gap: 9 (ref 5–15)
BILIRUBIN TOTAL: 0.7 mg/dL (ref 0.3–1.2)
BUN: 21 mg/dL — AB (ref 6–20)
CALCIUM: 9.4 mg/dL (ref 8.9–10.3)
CO2: 28 mmol/L (ref 22–32)
Chloride: 102 mmol/L (ref 101–111)
Creatinine, Ser: 0.64 mg/dL (ref 0.44–1.00)
GFR calc Af Amer: >60 mL/min (ref >60)
Glucose, Bld: 135 mg/dL — ABNORMAL HIGH (ref 65–99)
POTASSIUM: 4 mmol/L (ref 3.5–5.1)
Sodium: 139 mmol/L (ref 135–145)
TOTAL PROTEIN: 7.1 g/dL (ref 6.5–8.1)

## 2017-01-24 LAB — CBC
HEMATOCRIT: 37 % (ref 35.0–47.0)
Hemoglobin: 12.1 g/dL (ref 12.0–16.0)
MCH: 26.1 pg (ref 26.0–34.0)
MCHC: 32.6 g/dL (ref 32.0–36.0)
MCV: 79.8 fL — ABNORMAL LOW (ref 80.0–100.0)
PLATELETS: 479 10*3/uL — AB (ref 150–440)
RBC: 4.63 MIL/uL (ref 3.80–5.20)
RDW: 16.5 % — AB (ref 11.5–14.5)
WBC: 9.6 10*3/uL (ref 3.6–11.0)

## 2017-01-24 LAB — LACTIC ACID, PLASMA: Lactic Acid, Venous: 1 mmol/L (ref 0.5–1.9)

## 2017-01-24 MED ORDER — ENOXAPARIN SODIUM 40 MG/0.4ML ~~LOC~~ SOLN
40.0000 mg | SUBCUTANEOUS | Status: DC
  Administered 2017-01-24: 40 mg via SUBCUTANEOUS
  Filled 2017-01-24: qty 0.4

## 2017-01-24 MED ORDER — SODIUM CHLORIDE 0.9 % IV BOLUS (SEPSIS)
1000.0000 mL | Freq: Once | INTRAVENOUS | Status: AC
  Administered 2017-01-24: 1000 mL via INTRAVENOUS

## 2017-01-24 MED ORDER — OXYCODONE HCL 5 MG PO TABS: 5.0000 mg | ORAL_TABLET | ORAL | Status: DC | PRN

## 2017-01-24 MED ORDER — DEXTROSE IN LACTATED RINGERS 5 % IV SOLN
INTRAVENOUS | Status: DC
  Administered 2017-01-24: 09:00:00 via INTRAVENOUS

## 2017-01-24 MED ORDER — ONDANSETRON 4 MG PO TBDP: 4.0000 mg | ORAL_TABLET | Freq: Four times a day (QID) | ORAL | Status: DC | PRN

## 2017-01-24 MED ORDER — ONDANSETRON HCL 4 MG/2ML IJ SOLN
4.0000 mg | Freq: Once | INTRAMUSCULAR | Status: AC
Start: 2017-01-24 — End: 2017-01-24
  Administered 2017-01-24: 4 mg via INTRAVENOUS
  Filled 2017-01-24: qty 2

## 2017-01-24 MED ORDER — MORPHINE SULFATE (PF) 2 MG/ML IV SOLN: 2.0000 mg | INTRAVENOUS | Status: DC | PRN

## 2017-01-24 MED ORDER — MORPHINE SULFATE (PF) 4 MG/ML IV SOLN
4.0000 mg | Freq: Once | INTRAVENOUS | Status: AC
  Administered 2017-01-24: 4 mg via INTRAVENOUS
  Filled 2017-01-24: qty 1

## 2017-01-24 MED ORDER — KETOROLAC TROMETHAMINE 15 MG/ML IJ SOLN: 15.0000 mg | Freq: Four times a day (QID) | INTRAMUSCULAR | Status: DC | PRN

## 2017-01-24 MED ORDER — PANTOPRAZOLE SODIUM 40 MG IV SOLR
40.0000 mg | INTRAVENOUS | Status: DC
  Administered 2017-01-24: 40 mg via INTRAVENOUS
  Filled 2017-01-24: qty 40

## 2017-01-24 MED ORDER — IOPAMIDOL (ISOVUE-300) INJECTION 61%
100.0000 mL | Freq: Once | INTRAVENOUS | Status: AC | PRN
  Administered 2017-01-24: 100 mL via INTRAVENOUS

## 2017-01-24 MED ORDER — ACETAMINOPHEN 500 MG PO TABS
1000.0000 mg | ORAL_TABLET | Freq: Four times a day (QID) | ORAL | Status: DC
  Administered 2017-01-24: 1000 mg via ORAL
  Filled 2017-01-24: qty 2

## 2017-01-24 MED ORDER — HYDRALAZINE HCL 20 MG/ML IJ SOLN: 10.0000 mg | INTRAMUSCULAR | Status: DC | PRN

## 2017-01-24 MED ORDER — ONDANSETRON HCL 4 MG/2ML IJ SOLN: 4.0000 mg | Freq: Four times a day (QID) | INTRAMUSCULAR | Status: DC | PRN

## 2017-01-24 NOTE — ED Notes (Signed)
Pt stating that she began with n/v this evening. Pt stating that she was having abdominal pain that started this morning. Pt denying any fevers or diarrhea. Pt stating that she may be constipated but had a BM today after taking a laxative. Pt stating that she believes that she is more bloated and has "gas."

## 2017-01-24 NOTE — ED Notes (Signed)
Nurse assisted pt to restroom. Pt tolerated well at this time. Pt denying any needs at this time.

## 2017-01-24 NOTE — Discharge Instructions (Signed)
Advance to soft diet  Follow-up with primary care in 10 days Return to the emergency room should pain return or nausea vomiting etc. Resume all home medications

## 2017-01-24 NOTE — ED Notes (Signed)
Departing to room 215

## 2017-01-24 NOTE — Progress Notes (Signed)
Visited patient.  Discussed case with Dr. Dahlia Byes.  Patient currently describes absolutely no abdominal pain no nausea vomiting fevers or chills her pain that she brought her to the emergency room is completely gone.  She thinks she is passing gas.  History is reviewed and a CT scan is personally reviewed  Vital signs are stable afebrile Soft abdomen nondistended nontympanitic and completely nontender  We will give clear liquids reexamined later today and probably discharge.

## 2017-01-24 NOTE — ED Notes (Signed)
Attempted to call report to floor 

## 2017-01-24 NOTE — ED Triage Notes (Signed)
Pt to triage via [place], reports generalized abd pain x 1 day w/ vomiting.  Pt states unable to tolerate anything solid, tolerating small amounts of fluid.  Pt in NAD at this time, resp equal and unlabored, skin warm and dry.

## 2017-01-24 NOTE — Progress Notes (Signed)
Discharge teaching given to patient, patient verbalized understanding and had no questions. Patient IV removed. Patient will be transported home by family. All patient belongings gathered prior to leaving.  

## 2017-01-24 NOTE — ED Notes (Signed)
Called report to Andrea RN

## 2017-01-24 NOTE — ED Provider Notes (Signed)
[facility] Emergency Department Provider Note  ____________________________________________   First MD Initiated Contact with Patient 01/24/17 [phone]     (approximate)  I have reviewed the triage vital signs and the nursing notes.   HISTORY  Chief Complaint Abdominal Pain and Emesis   HPI Catherine Burgess is a 81 y.o. female Who self presents to the emergency department with 1 day of constant diffuse moderate severity abdominal pain nausea and vomiting.  Unable to keep any food down whatsoever. Her pain is moderate severity and intermittent.  It is not worsened by eating.  She has no history of abdominal surgeries.  Her pain is nonradiating.   Past Medical History:  Diagnosis Date  . Degenerative joint disease   . Hyperlipidemia   . Hypertension   . Osteoarthrosis   . Reflux esophagitis     Patient Active Problem List   Diagnosis Date Noted  . S/P total knee arthroplasty 12/07/2016  . History of anemia 10/13/2016  . Syncope 07/23/2016  . Malignant essential hypertension 07/23/2016  . Hypokalemia 07/23/2016  . Abnormal CT of brain 07/23/2016  . Vitamin D deficiency 10/28/2015  . Post menopausal syndrome 10/28/2015  . OP (osteoporosis) 10/28/2015  . Cervical disc disease 10/28/2015  . H/O total knee replacement 01/20/2015  . Vaginal atrophy 09/26/2014  . Osteoarthritis of both knees 07/31/2014  . GERD without esophagitis 07/31/2014  . HLD (hyperlipidemia) 07/31/2014  . Hypertension, benign 07/31/2014  . History of artificial joint 05/18/2013    Past Surgical History:  Procedure Laterality Date  . CATARACT EXTRACTION, BILATERAL    . HIP PINNING Left 1956  . REPLACEMENT TOTAL KNEE Left 11/12/2009  . TOTAL KNEE REVISION Left 12/07/2016   Procedure: TOTAL KNEE REVISION;  Surgeon: Dereck Leep, MD;  Location: [facility] ORS;  Service: Orthopedics;  Laterality: Left;    Prior to Admission medications   Medication Sig Start Date End Date  Taking? Authorizing Provider  acetaminophen (TYLENOL) 500 MG tablet Take 1 tablet (500 mg total) by mouth 2 (two) times daily. Patient taking differently: Take 500 mg by mouth every 6 (six) hours as needed for mild pain.  10/28/15  Yes Sowles, [name], MD  amLODipine (NORVASC) 5 MG tablet Take 1 tablet (5 mg total) by mouth daily. 10/13/16  Yes Sowles, [name], MD  atorvastatin (LIPITOR) 40 MG tablet take 1 tablet by mouth once daily 08/22/16  Yes Sowles, [name], MD  Calcium-Vitamin D 600-200 MG-UNIT tablet Take 1 tablet by mouth daily.    Yes [provider]  gabapentin (NEURONTIN) 300 MG capsule take 1 capsule by mouth two times a day Patient taking differently: Take 300 mg by mouth 2 (two) times daily.  07/01/16  Yes Sowles, [name], MD  losartan (COZAAR) 50 MG tablet Take 1 tablet (50 mg total) by mouth daily. 10/13/16  Yes Sowles, [name], MD  meloxicam (MOBIC) 15 MG tablet Take 1 tablet by mouth daily. 08/11/16  Yes Hooten, Laurice Record, MD  oxyCODONE (OXY IR/ROXICODONE) 5 MG immediate release tablet Take 1 tablet (5 mg total) by mouth every 3 (three) hours as needed for moderate pain ((score 4 to 6)). 12/08/16  Yes Watt Climes, PA  pantoprazole (PROTONIX) 40 MG tablet Take 1 tablet (40 mg total) by mouth daily. 10/13/16  Yes Sowles, [name], MD  ranitidine (ZANTAC) 150 MG tablet Take 150 mg by mouth 2 (two) times daily.   Yes [provider]  traMADol (ULTRAM) 50 MG tablet Take 1-2 tablets (50-100 mg total)  by mouth every 4 (four) hours as needed for moderate pain. 12/08/16  Yes Watt Climes, PA  traMADol-acetaminophen (ULTRACET) 37.5-325 MG tablet Take 1 tablet by mouth every 8 (eight) hours as needed. Patient not taking: Reported on 01/24/2017 03/05/16   Steele Sizer, MD    Allergies Lovastatin; Pravastatin; Rosuvastatin; and Statins  Family History  Problem Relation Age of Onset  . Emphysema Father        Smoker  . CAD Mother   . Stroke Mother   . Diabetes Brother   .  Seizures Brother   . Cancer Brother   . Diabetes Brother     Social History Social History   Tobacco Use  . Smoking status: Former Smoker    Last attempt to quit: 1966    Years since quitting: 52.9  . Smokeless tobacco: Never Used  Substance Use Topics  . Alcohol use: No    Alcohol/week: 0.0 oz  . Drug use: No    Review of Systems Constitutional: No fever/chills Eyes: No visual changes. ENT: No sore throat. Cardiovascular: Denies chest pain. Respiratory: Denies shortness of breath. Gastrointestinal: Positive for abdominal pain.  Positive for nausea, positive for vomiting.  No diarrhea.  No constipation. Genitourinary: Negative for dysuria. Musculoskeletal: Negative for back pain. Skin: Negative for rash. Neurological: Negative for headaches, focal weakness or numbness.   ____________________________________________   PHYSICAL EXAM:  VITAL SIGNS: ED Triage Vitals  Enc Vitals Group     BP 01/24/17 0323 140/75     Pulse Rate 01/24/17 0323 100     Resp 01/24/17 0323 16     Temp 01/24/17 0323 98.7 F (37.1 C)     Temp Source 01/24/17 0323 Oral     SpO2 01/24/17 0323 100 %     Weight 01/24/17 0324 140 lb (63.5 kg)     Height 01/24/17 0324 5\' 3"  (1.6 m)     Head Circumference --      Peak Flow --      Pain Score 01/24/17 0323 6     Pain Loc --      Pain Edu? --      Excl. in [place]? --     Constitutional: Alert and oriented x4 pleasant cooperative speaks in full clear sentences no diaphoresis Eyes: PERRL EOMI. Head: Atraumatic. Nose: No congestion/rhinnorhea. Mouth/Throat: No trismus Neck: No stridor.   Cardiovascular: Normal rate, regular rhythm. Grossly normal heart sounds.  Good peripheral circulation. Respiratory: Normal respiratory effort.  No retractions. Lungs CTAB and moving good air Gastrointestinal: Soft mild diffuse tenderness with no focality no rebound or guarding no peritonitis Musculoskeletal: No lower extremity edema   Neurologic:  Normal speech  and language. No gross focal neurologic deficits are appreciated. Skin:  Skin is warm, dry and intact. No rash noted. Psychiatric: Mood and affect are normal. Speech and behavior are normal.    ____________________________________________   DIFFERENTIAL includes but not limited to  Appendicitis, diverticulitis, pyelonephritis, nephrolithiasis, small bowel obstruction, volvulus ____________________________________________   LABS (all labs ordered are listed, but only abnormal results are displayed)  Labs Reviewed  COMPREHENSIVE METABOLIC PANEL - Abnormal; Notable for the following components:      Result Value   Glucose, Bld 135 (*)    BUN 21 (*)    Alkaline Phosphatase 133 (*)    All other components within normal limits  CBC - Abnormal; Notable for the following components:   MCV 79.8 (*)    RDW 16.5 (*)    Platelets 479 (*)  All other components within normal limits  URINALYSIS, COMPLETE (UACMP) WITH MICROSCOPIC - Abnormal; Notable for the following components:   Color, Urine STRAW (*)    APPearance CLEAR (*)    Squamous Epithelial / LPF 0-5 (*)    All other components within normal limits  LIPASE, BLOOD  LACTIC ACID, PLASMA    Blood work reviewed by me with no white count and normal lactic acid __________________________________________  EKG   ____________________________________________  RADIOLOGY  CT abdomen pelvis reviewed by me shows mesenteric volvulus ____________________________________________   PROCEDURES  Procedure(s) performed: no  Procedures  Critical Care performed: no  Observation: no ____________________________________________   INITIAL IMPRESSION / ASSESSMENT AND PLAN / ED COURSE  Pertinent labs & imaging results that were available during my care of the patient were reviewed by me and considered in my medical decision making (see chart for details).  On arrival the patient has a relatively benign abdominal exam although has  difficulty keeping food down and has been vomiting.  Given her advanced age she requires a CT scan with IV contrast.  I was called by radiology that the patient CT scan shows a mesenteric volvulus.  I discussed the case with on-call general surgeon Dr. Elza Rafter who is graciously agreed to admit the patient to his service for serial examinations.  The patient verbalizes understanding and agreement with the plan.      ____________________________________________   FINAL CLINICAL IMPRESSION(S) / ED DIAGNOSES  Final diagnoses:  Volvulus ([place])      NEW MEDICATIONS STARTED DURING THIS VISIT:  This SmartLink is deprecated. Use AVSMEDLIST instead to display the medication list for a patient.   Note:  This document was prepared using Dragon voice recognition software and may include unintentional dictation errors.     Darel Hong, MD 01/24/17 [id number]

## 2017-01-24 NOTE — ED Notes (Addendum)
Attempted to call report to floor 

## 2017-01-24 NOTE — H&P (Signed)
Patient ID: Catherine Burgess, female   DOB: [birth year], 81 y.o.   MRN: [id number]  HPI Catherine Burgess is a 81 y.o. female came into the emergency room for episodes of abdominal pain nausea and vomiting. She reports the pain started yesterday he was a"nagging" pain, diffuse and lasted a few hours. She had multiple episodes of emesis that was nonbilious. No fevers no chills. No previous abdominal operations. No previous episodes similar to this. She did have a bowel movement and is passing gas. She is able to perform more than 4 Mets of activity without any shortness of breath or chest pain. She had a left knee replacement 2 months ago. CT scan personal review there is evidence of some thickening of the mesentery and the radiologist questioning out of all loose within the mesentery. However on personal inspection there is some edema but is not a clear-cut image for a volvulus of the mesentery. Most importantly the patient is feeling very well with no abdominal pain currently. Labs are reviewed including a normal lactate, normal white count, normal creatinine.  HPI  Past Medical History:  Diagnosis Date  . Degenerative joint disease   . Hyperlipidemia   . Hypertension   . Osteoarthrosis   . Reflux esophagitis     Past Surgical History:  Procedure Laterality Date  . CATARACT EXTRACTION, BILATERAL    . HIP PINNING Left 1956  . REPLACEMENT TOTAL KNEE Left 11/12/2009  . TOTAL KNEE REVISION Left 12/07/2016   Procedure: TOTAL KNEE REVISION;  Surgeon: Dereck Leep, MD;  Location: [facility] ORS;  Service: Orthopedics;  Laterality: Left;    Family History  Problem Relation Age of Onset  . Emphysema Father        Smoker  . CAD Mother   . Stroke Mother   . Diabetes Brother   . Seizures Brother   . Cancer Brother   . Diabetes Brother     Social History Social History   Tobacco Use  . Smoking status: Former Smoker    Last attempt to quit: 1966    Years since quitting: 52.9  .  Smokeless tobacco: Never Used  Substance Use Topics  . Alcohol use: No    Alcohol/week: 0.0 oz  . Drug use: No    Allergies  Allergen Reactions  . Lovastatin Other (See Comments)    chest  . Pravastatin     Other reaction(s): Muscle Pain chest  . Rosuvastatin     Other reaction(s): Muscle Pain chest Other reaction(s): Muscle Pain chest  . Statins     Other reaction(s): Muscle Pain chest    No current facility-administered medications for this encounter.    Current Outpatient Medications  Medication Sig Dispense Refill  . acetaminophen (TYLENOL) 500 MG tablet Take 1 tablet (500 mg total) by mouth 2 (two) times daily. (Patient taking differently: Take 500 mg by mouth every 6 (six) hours as needed for mild pain. ) 180 tablet 0  . amLODipine (NORVASC) 5 MG tablet Take 1 tablet (5 mg total) by mouth daily. 90 tablet 1  . atorvastatin (LIPITOR) 40 MG tablet take 1 tablet by mouth once daily 90 tablet 0  . Calcium-Vitamin D 600-200 MG-UNIT tablet Take 1 tablet by mouth daily.     [name] gabapentin (NEURONTIN) 300 MG capsule take 1 capsule by mouth two times a day (Patient taking differently: Take 300 mg by mouth 2 (two) times daily. ) 180 capsule 1  . losartan (COZAAR) 50 MG tablet Take 1  tablet (50 mg total) by mouth daily. 90 tablet 1  . meloxicam (MOBIC) 15 MG tablet Take 1 tablet by mouth daily.  1  . oxyCODONE (OXY IR/ROXICODONE) 5 MG immediate release tablet Take 1 tablet (5 mg total) by mouth every 3 (three) hours as needed for moderate pain ((score 4 to 6)). 30 tablet 0  . pantoprazole (PROTONIX) 40 MG tablet Take 1 tablet (40 mg total) by mouth daily. 90 tablet 1  . ranitidine (ZANTAC) 150 MG tablet Take 150 mg by mouth 2 (two) times daily.    . traMADol (ULTRAM) 50 MG tablet Take 1-2 tablets (50-100 mg total) by mouth every 4 (four) hours as needed for moderate pain. 30 tablet 0  . traMADol-acetaminophen (ULTRACET) 37.5-325 MG tablet Take 1 tablet by mouth every 8 (eight) hours  as needed. (Patient not taking: Reported on 01/24/2017) 90 tablet 0     Review of Systems Full ROS  was asked and was negative except for the information on the HPI  Physical Exam Blood pressure 135/68, pulse 84, temperature 98.7 F (37.1 C), temperature source Oral, resp. rate 18, height 5\' 3"  (1.6 m), weight 63.5 kg (140 lb), SpO2 97 %. CONSTITUTIONAL: NAD EYES: Pupils are equal, round, and reactive to light, Sclera are non-icteric. EARS, NOSE, MOUTH AND THROAT: The oropharynx is clear. The oral mucosa is pink and moist. Hearing is intact to voice. LYMPH NODES:  Lymph nodes in the neck are normal. RESPIRATORY:  Lungs are clear. There is normal respiratory effort, with equal breath sounds bilaterally, and without pathologic use of accessory muscles. CARDIOVASCULAR: Heart is regular without murmurs, gallops, or rubs. GI: The abdomen is  soft, nontender, and nondistended. There are no palpable masses. There is no hepatosplenomegaly. There are normal bowel sounds in all quadrants. GU: Rectal deferred.   MUSCULOSKELETAL: Normal muscle strength and tone. No cyanosis or edema.   SKIN: Turgor is good and there are no pathologic skin lesions or ulcers. NEUROLOGIC: Motor and sensation is grossly normal. Cranial nerves are grossly intact. PSYCH:  Oriented to person, place and time. Affect is normal.  Data Reviewed  I have personally reviewed the patient's imaging, laboratory findings and medical records.    Assessment/Plan  81 year old female with acute onset of abdominal pain and vomiting. CT scan concerning for potential [place] within the mesentery. She clinically looks very well without any signs of peritonitis or signs of bowel ischemia. Clinically the picture is not consistent with what the CT scan report shows. Moreover I do think that the CT scan reading is not definitively conclusive. I have explained this to the patient in detail and my recommendation would be to admit her to the  hospital place her nothing by mouth, IV fluids and serial abdominal exams with a repeat x-ray in the morning. I do think that at some point in time within the next few weeks she may need another CT scan of the abdomen to rule out any potential mesenteric tumor such as a lymphoma. There is no indication for emergent surgical revision this time. Extensive counseling provided. Case discussed in detail with Dr. Althea Charon, MD FACS General Surgeon 01/24/2017, 6:48 AM

## 2017-01-24 NOTE — ED Notes (Signed)
Patient transported to CT 

## 2017-01-24 NOTE — Progress Notes (Signed)
Visited patient.  She is tolerating a diet.  She has no nausea vomiting fevers chills and absolutely no abdominal pain at this point she is passing gas  Vital signs reviewed and stable Abdomen is soft nondistended nontympanitic and nontender Calves are nontender  Patient doing very well.  The etiology of the abnormal CAT scan is unclear but her pain is completely resolved.  I suggested that she could stay longer and be observed but since her pain is completely resolved and she has reliable family members here in town she could be discharged with the understanding that if this were to recur she would come back to the hospital and might possibly require surgical intervention should it recur.  She understood and agreed with this plan

## 2017-02-12 ENCOUNTER — Ambulatory Visit: Payer: Medicare Other | Admitting: Family Medicine

## 2017-03-01 ENCOUNTER — Ambulatory Visit (INDEPENDENT_AMBULATORY_CARE_PROVIDER_SITE_OTHER): Payer: Medicare Other | Admitting: Family Medicine

## 2017-03-01 ENCOUNTER — Encounter: Payer: Self-pay | Admitting: Family Medicine

## 2017-03-01 VITALS — BP 118/62 | HR 88 | Temp 97.8°F | Resp 18 | Ht 62.0 in | Wt 136.6 lb

## 2017-03-01 DIAGNOSIS — M542 Cervicalgia: Secondary | ICD-10-CM | POA: Diagnosis not present

## 2017-03-01 DIAGNOSIS — M170 Bilateral primary osteoarthritis of knee: Secondary | ICD-10-CM

## 2017-03-01 DIAGNOSIS — E782 Mixed hyperlipidemia: Secondary | ICD-10-CM | POA: Diagnosis not present

## 2017-03-01 DIAGNOSIS — I10 Essential (primary) hypertension: Secondary | ICD-10-CM | POA: Diagnosis not present

## 2017-03-01 DIAGNOSIS — K5909 Other constipation: Secondary | ICD-10-CM | POA: Diagnosis not present

## 2017-03-01 DIAGNOSIS — K219 Gastro-esophageal reflux disease without esophagitis: Secondary | ICD-10-CM | POA: Diagnosis not present

## 2017-03-01 DIAGNOSIS — D649 Anemia, unspecified: Secondary | ICD-10-CM

## 2017-03-01 MED ORDER — ATORVASTATIN CALCIUM 40 MG PO TABS: 40.0000 mg | ORAL_TABLET | Freq: Every day | ORAL | 1 refills | Status: DC

## 2017-03-01 MED ORDER — RANITIDINE HCL 150 MG PO TABS: 150.0000 mg | ORAL_TABLET | Freq: Two times a day (BID) | ORAL | 1 refills | Status: DC

## 2017-03-01 MED ORDER — LOSARTAN POTASSIUM 100 MG PO TABS: 100.0000 mg | ORAL_TABLET | Freq: Every day | ORAL | 1 refills | Status: DC

## 2017-03-01 MED ORDER — POLYETHYLENE GLYCOL 3350 17 G PO PACK: 17.0000 g | PACK | Freq: Every day | ORAL | 0 refills | Status: DC

## 2017-03-01 MED ORDER — AMLODIPINE BESYLATE 2.5 MG PO TABS: 2.5000 mg | ORAL_TABLET | Freq: Every day | ORAL | 1 refills | Status: DC

## 2017-03-01 MED ORDER — GABAPENTIN 300 MG PO CAPS: 300.0000 mg | ORAL_CAPSULE | Freq: Two times a day (BID) | ORAL | 1 refills | Status: DC

## 2017-03-01 NOTE — Progress Notes (Signed)
Name: Catherine Burgess   MRN: [id number]    DOB: July 19, 1935   Date:03/01/2017       Progress Note  Subjective  Chief Complaint  Chief Complaint  Patient presents with  . Medication Refill    4 month F/U  . Hypertension    Denies any symptoms  . Hyperlipidemia  . Osteoarthritis    Knee pain has been off and on  . Gastroesophageal Reflux    Has weaned off of Ranitidine and just taking Pantoprazole.    HPI  HTN : BP is towards low end of normal , on Norvasc and Losartan, we will adjust dose , continue bp medication, no chest pain or palpitation   Hyperlipidemia: taking Atorvastatin, reviewed labs done 10/2015 , doing well , no myalgias .   OA: both knees, has daily pain, currently on Tylenol prn and Gabapentin. Sees Dr. Jovita Kussmaul s/p total left knee replacement, she has decrease rom of left knee and pain has been getting progressively worse, she had a revision  11/2016, still getting PT but rom is not very good  Hyperglycemia: she denies polyphagia, polyuria, she states she has polyuria. She is still likes bread, but trying to avoid bread and desserts. Last hgbA1C down to 6.1%  Neck pain: doing better, no radiculitis, taking gabapentin twice daily, denies mental fogginess or fatigue    GERD: no symptoms at this time, denies regurgitation or heartburn, she has not been trying to wean self off . She still has some protonix at home, admitted in Dece 2018 with volvulus and CT showed possible gastritis and esophagitis, however patient is asymptomatic and can continue to try weaning self off PPI   Syncope: admitted to [facility] after syncopal episode 07/2016 going to have EEG for 3 days soon. No other episodes. Seen by Dr. Manuella Ghazi   Constipation: she has noticed some constipation and needs refill of Miralax, she takes Tramadol prn and it makes symptoms worse  Patient Active Problem List   Diagnosis Date Noted  . Abdominal pain 01/24/2017  . Volvulus ([place])   . S/P total knee  arthroplasty 12/07/2016  . History of anemia 10/13/2016  . Syncope 07/23/2016  . Malignant essential hypertension 07/23/2016  . Hypokalemia 07/23/2016  . Abnormal CT of brain 07/23/2016  . Vitamin D deficiency 10/28/2015  . Post menopausal syndrome 10/28/2015  . OP (osteoporosis) 10/28/2015  . Cervical disc disease 10/28/2015  . H/O total knee replacement 01/20/2015  . Vaginal atrophy 09/26/2014  . Osteoarthritis of both knees 07/31/2014  . GERD without esophagitis 07/31/2014  . HLD (hyperlipidemia) 07/31/2014  . Hypertension, benign 07/31/2014  . History of artificial joint 05/18/2013    Past Surgical History:  Procedure Laterality Date  . CATARACT EXTRACTION, BILATERAL    . HIP PINNING Left 1956  . REPLACEMENT TOTAL KNEE Left 11/12/2009  . TOTAL KNEE REVISION Left 12/07/2016   Procedure: TOTAL KNEE REVISION;  Surgeon: Dereck Leep, MD;  Location: [facility] ORS;  Service: Orthopedics;  Laterality: Left;    Family History  Problem Relation Age of Onset  . Emphysema Father        Smoker  . CAD Mother   . Stroke Mother   . Diabetes Brother   . Seizures Brother   . Cancer Brother   . Diabetes Brother     Social History   Socioeconomic History  . Marital status: Single    Spouse name: Not on file  . Number of children: Not on file  . Years of education:  Not on file  . Highest education level: Not on file  Social Needs  . Financial resource strain: Not on file  . Food insecurity - worry: Not on file  . Food insecurity - inability: Not on file  . Transportation needs - medical: Not on file  . Transportation needs - non-medical: Not on file  Occupational History  . Not on file  Tobacco Use  . Smoking status: Former Smoker    Last attempt to quit: 1966    Years since quitting: 53.0  . Smokeless tobacco: Never Used  Substance and Sexual Activity  . Alcohol use: No    Alcohol/week: 0.0 oz  . Drug use: No  . Sexual activity: Not Currently  Other Topics Concern  .  Not on file  Social History Narrative  . Not on file     Current Outpatient Medications:  .  acetaminophen (TYLENOL) 500 MG tablet, Take 1 tablet (500 mg total) by mouth 2 (two) times daily. (Patient taking differently: Take 500 mg by mouth every 6 (six) hours as needed for mild pain. ), Disp: 180 tablet, Rfl: 0 .  amLODipine (NORVASC) 5 MG tablet, Take 1 tablet (5 mg total) by mouth daily., Disp: 90 tablet, Rfl: 1 .  aspirin EC 81 MG tablet, Take 81 mg by mouth., Disp: , Rfl:  .  atorvastatin (LIPITOR) 40 MG tablet, take 1 tablet by mouth once daily, Disp: 90 tablet, Rfl: 0 .  Calcium-Vitamin D 600-200 MG-UNIT tablet, Take 1 tablet by mouth daily. , Disp: , Rfl:  .  gabapentin (NEURONTIN) 300 MG capsule, take 1 capsule by mouth two times a day (Patient taking differently: Take 300 mg by mouth 2 (two) times daily. ), Disp: 180 capsule, Rfl: 1 .  losartan (COZAAR) 50 MG tablet, Take 1 tablet (50 mg total) by mouth daily., Disp: 90 tablet, Rfl: 1 .  pantoprazole (PROTONIX) 40 MG tablet, Take 1 tablet (40 mg total) by mouth daily., Disp: 90 tablet, Rfl: 1 .  traMADol (ULTRAM) 50 MG tablet, Take 1-2 tablets (50-100 mg total) by mouth every 4 (four) hours as needed for moderate pain., Disp: 30 tablet, Rfl: 0 .  ranitidine (ZANTAC) 150 MG tablet, Take 150 mg by mouth 2 (two) times daily., Disp: , Rfl:   Allergies  Allergen Reactions  . Lovastatin Other (See Comments)    chest  . Pravastatin     Other reaction(s): Muscle Pain chest  . Rosuvastatin     Other reaction(s): Muscle Pain chest Other reaction(s): Muscle Pain chest  . Statins     Other reaction(s): Muscle Pain chest     ROS  Constitutional: Negative for fever or weight change.  Respiratory: Negative for cough and shortness of breath.   Cardiovascular: Negative for chest pain or palpitations.  Gastrointestinal: Negative for abdominal pain, no bowel changes.  Musculoskeletal: Positive  for gait problem or joint swelling.   Skin: Negative for rash.  Neurological: Negative for dizziness or headache.  No other specific complaints in a complete review of systems (except as listed in HPI above).  Objective  Vitals:   03/01/17 1447  BP: 118/62  Pulse: 88  Resp: 18  Temp: 97.8 F (36.6 C)  TempSrc: Oral  SpO2: 97%  Weight: 136 lb 9.6 oz (62 kg)  Height: 5' 2"  (1.575 m)    Body mass index is 24.98 kg/m.  Physical Exam  Constitutional: Patient appears well-developed and well-nourished. No distress.  HEENT: head atraumatic, normocephalic, pupils equal and reactive  to light,  neck supple, throat within normal limits Cardiovascular: Normal rate, regular rhythm and normal heart sounds.  No murmur heard. No BLE edema. Pulmonary/Chest: Effort normal and breath sounds normal. No respiratory distress. Abdominal: Soft.  There is no tenderness. Psychiatric: Patient has a normal mood and affect. behavior is normal. Judgment and thought content normal. Muscular skeletal: using a walker, not able to flex knee beyond 60 degrees  Recent Results (from the past 2160 hour(s))  ABO/Rh     Status: None   Collection Time: 12/07/16 10:14 AM  Result Value Ref Range   ABO/RH(D) O POS   Body fluid culture     Status: None   Collection Time: 12/07/16  1:25 PM  Result Value Ref Range   Specimen Description KNEE LEFT    Special Requests NONE    Gram Stain NO ORGANISMS SEEN RARE WBC     Culture      NO GROWTH 3 DAYS Performed at [facility] [address] [place], [place] [address]    Report Status 12/11/2016 FINAL   Surgical pathology     Status: None   Collection Time: 12/07/16  1:35 PM  Result Value Ref Range   SURGICAL PATHOLOGY      Surgical Pathology CASE: [id number] PATIENT: Adrian Saran Caslin Surgical Pathology Report     SPECIMEN SUBMITTED: A. Synovium tissue, left knee B. Femoral tissue, left knee  CLINICAL HISTORY: None provided  PRE-OPERATIVE DIAGNOSIS: Status post left total  knee replacement with loosening of femoral implant and pain  POST-OPERATIVE DIAGNOSIS: Same as pre-op     DIAGNOSIS: A. SYNOVIAL TISSUE, LEFT KNEE; TOTAL KNEE REVISION: - DENSE FIBROSIS WITH CHONDROID METAPLASIA. - EXTENSIVE CALCIUM PYROPHOSPHATE DEPOSITS.  B. TISSUE FROM LEFT FEMUR; TOTAL KNEE REVISION: - FIBRINOUS DEBRIS AND ADJACENT FIBROUS TISSUE WITH FOREIGN BODY REACTION.  Comment: No acute inflammatory infiltrates (neutrophils) are identified in these specimens. Clinical correlation is recommended with regard to the significance of the synovial calcium pyrophosphate deposits.   GROSS DESCRIPTION: A. Labeled: synovial tissue left knee Tissue fragment(s): 1 Size: 5.3 x 1.5 x 1.1 cm Desc ription: rubbery Tan to purple gray tissue  Representative sections submitted in 1 cassette(s).   B. Labeled: left knee femoral tissue (pathology) Tissue fragment(s): multiple Size: aggregate, 3.5 x 2.6 x 1.1 cm Description: pink-tan rubbery fragments  Representative sections submitted in 1 cassette(s).  Final Diagnosis performed by Bryan Lemma, MD.  Electronically signed 12/09/2016 12:26:50PM    The electronic signature indicates that the named Attending Pathologist has evaluated the specimen  Technical component performed at [facility], [address], [place], [place] [address] Lab: [phone] Dir: Darrick Penna. Evette Doffing, MD  Professional component performed at [facility], [facility], [place], [place], [place] [address] Lab: [phone] Dir: Dellia Nims. Rubinas, MD    Aerobic/Anaerobic Culture (surgical/deep wound)     Status: None   Collection Time: 12/07/16  2:25 PM  Result Value Ref Range   Specimen Description TISSUE LEFT KNEE    Special Requests NONE    Gram Stain NO ORGANISMS SEEN FEW WBC SEEN FEW RBC     Culture      No growth aerobically or anaerobically. Performed at [facility], [place] [address], [place], [place] [address]     Report Status 12/13/2016 FINAL   Aerobic/Anaerobic Culture (surgical/deep wound)     Status: None   Collection Time: 12/07/16  2:27 PM  Result Value Ref Range   Specimen Description TISSUE LEFT KNEE UNDER  THE FEMORAL    Special Requests NONE    Gram Stain      NO ORGANISMS SEEN MODERATE WBC SEEN MODERATE RBC    Culture      No growth aerobically or anaerobically. Performed at [facility], [place] [address], [place], [place] [address]    Report Status 12/13/2016 FINAL   Body fluid culture     Status: None   Collection Time: 12/07/16  2:46 PM  Result Value Ref Range   Specimen Description KNEE LEFT KNEE FEMORAL MEDULLARY CANAL    Special Requests NONE    Gram Stain NO ORGANISMS SEEN RARE WBC SEEN NO RBC SEEN     Culture      NO GROWTH 3 DAYS Performed at [facility], [place] [address], [place],  [address]    Report Status 12/11/2016 FINAL   CBC     Status: Abnormal   Collection Time: 12/08/16  5:08 AM  Result Value Ref Range   WBC 10.0 3.6 - 11.0 K/uL   RBC 4.30 3.80 - 5.20 MIL/uL   Hemoglobin 11.3 (L) 12.0 - 16.0 g/dL   HCT 35.3 35.0 - 47.0 %   MCV 82.1 80.0 - 100.0 fL   MCH 26.3 26.0 - 34.0 pg   MCHC 32.0 32.0 - 36.0 g/dL   RDW 17.3 (H) 11.5 - 14.5 %   Platelets 271 150 - 440 K/uL  Basic metabolic panel     Status: Abnormal   Collection Time: 12/08/16  5:08 AM  Result Value Ref Range   Sodium 138 135 - 145 mmol/L   Potassium 3.3 (L) 3.5 - 5.1 mmol/L   Chloride 105 101 - 111 mmol/L   CO2 27 22 - 32 mmol/L   Glucose, Bld 150 (H) 65 - 99 mg/dL   BUN 16 6 - 20 mg/dL   Creatinine, Ser 0.98 0.44 - 1.00 mg/dL   Calcium 8.4 (L) 8.9 - 10.3 mg/dL   GFR calc non Af Amer 54 (L) >60 mL/min   GFR calc Af Amer >60 >60 mL/min    Comment: (NOTE) The eGFR has been calculated using the CKD EPI equation. This calculation has not been validated in all clinical situations. eGFR's persistently <60 mL/min signify possible Chronic Kidney Disease.    Anion gap 6 5 -  15  CBC     Status: Abnormal   Collection Time: 12/09/16  5:10 AM  Result Value Ref Range   WBC 9.2 3.6 - 11.0 K/uL   RBC 3.88 3.80 - 5.20 MIL/uL   Hemoglobin 10.4 (L) 12.0 - 16.0 g/dL   HCT 31.6 (L) 35.0 - 47.0 %   MCV 81.4 80.0 - 100.0 fL   MCH 26.8 26.0 - 34.0 pg   MCHC 32.9 32.0 - 36.0 g/dL   RDW 17.1 (H) 11.5 - 14.5 %   Platelets 270 150 - 440 K/uL  Basic metabolic panel     Status: Abnormal   Collection Time: 12/09/16  5:10 AM  Result Value Ref Range   Sodium 141 135 - 145 mmol/L   Potassium 4.0 3.5 - 5.1 mmol/L   Chloride 108 101 - 111 mmol/L   CO2 29 22 - 32 mmol/L   Glucose, Bld 137 (H) 65 - 99 mg/dL   BUN 18 6 - 20 mg/dL   Creatinine, Ser 0.82 0.44 - 1.00 mg/dL   Calcium 8.2 (L) 8.9 - 10.3 mg/dL   GFR calc non Af Amer >60 >60 mL/min   GFR  calc Af Amer >60 >60 mL/min    Comment: (NOTE) The eGFR has been calculated using the CKD EPI equation. This calculation has not been validated in all clinical situations. eGFR's persistently <60 mL/min signify possible Chronic Kidney Disease.    Anion gap 4 (L) 5 - 15  Lipase, blood     Status: None   Collection Time: 01/24/17  3:38 AM  Result Value Ref Range   Lipase 30 11 - 51 U/L  Comprehensive metabolic panel     Status: Abnormal   Collection Time: 01/24/17  3:38 AM  Result Value Ref Range   Sodium 139 135 - 145 mmol/L   Potassium 4.0 3.5 - 5.1 mmol/L   Chloride 102 101 - 111 mmol/L   CO2 28 22 - 32 mmol/L   Glucose, Bld 135 (H) 65 - 99 mg/dL   BUN 21 (H) 6 - 20 mg/dL   Creatinine, Ser 0.64 0.44 - 1.00 mg/dL   Calcium 9.4 8.9 - 10.3 mg/dL   Total Protein 7.1 6.5 - 8.1 g/dL   Albumin 3.7 3.5 - 5.0 g/dL   AST 24 15 - 41 U/L   ALT 18 14 - 54 U/L   Alkaline Phosphatase 133 (H) 38 - 126 U/L   Total Bilirubin 0.7 0.3 - 1.2 mg/dL   GFR calc non Af Amer >60 >60 mL/min   GFR calc Af Amer >60 >60 mL/min    Comment: (NOTE) The eGFR has been calculated using the CKD EPI equation. This calculation has not been validated in  all clinical situations. eGFR's persistently <60 mL/min signify possible Chronic Kidney Disease.    Anion gap 9 5 - 15  CBC     Status: Abnormal   Collection Time: 01/24/17  3:38 AM  Result Value Ref Range   WBC 9.6 3.6 - 11.0 K/uL   RBC 4.63 3.80 - 5.20 MIL/uL   Hemoglobin 12.1 12.0 - 16.0 g/dL   HCT 37.0 35.0 - 47.0 %   MCV 79.8 (L) 80.0 - 100.0 fL   MCH 26.1 26.0 - 34.0 pg   MCHC 32.6 32.0 - 36.0 g/dL   RDW 16.5 (H) 11.5 - 14.5 %   Platelets 479 (H) 150 - 440 K/uL  Urinalysis, Complete w Microscopic     Status: Abnormal   Collection Time: 01/24/17  4:27 AM  Result Value Ref Range   Color, Urine STRAW (A) YELLOW   APPearance CLEAR (A) CLEAR   Specific Gravity, Urine 1.006 1.005 - 1.030   pH 6.0 5.0 - 8.0   Glucose, UA NEGATIVE NEGATIVE mg/dL   Hgb urine dipstick NEGATIVE NEGATIVE   Bilirubin Urine NEGATIVE NEGATIVE   Ketones, ur NEGATIVE NEGATIVE mg/dL   Protein, ur NEGATIVE NEGATIVE mg/dL   Nitrite NEGATIVE NEGATIVE   Leukocytes, UA NEGATIVE NEGATIVE   RBC / HPF 0-5 0 - 5 RBC/hpf   WBC, UA 0-5 0 - 5 WBC/hpf   Bacteria, UA NONE SEEN NONE SEEN   Squamous Epithelial / LPF 0-5 (A) NONE SEEN  Lactic acid, plasma     Status: None   Collection Time: 01/24/17  5:10 AM  Result Value Ref Range   Lactic Acid, Venous 1.0 0.5 - 1.9 mmol/L      PHQ2/9: Depression screen [facility] 2/9 03/01/2017 10/05/2016 07/01/2016 03/05/2016 12/17/2015  Decreased Interest 0 0 0 0 0  Down, Depressed, Hopeless 0 0 0 0 0  PHQ - 2 Score 0 0 0 0 0     Fall Risk: Fall Risk  03/01/2017 01/08/2017 10/05/2016 07/01/2016 03/05/2016  Falls in the past year? No No No No No  Number falls in past yr: - - - - -  Injury with Fall? - - - - -  Follow up - - - - -    Functional Status Survey: Is the patient deaf or have difficulty hearing?: No Does the patient have difficulty seeing, even when wearing glasses/contacts?: No Does the patient have difficulty concentrating, remembering, or making decisions?: No Does  the patient have difficulty walking or climbing stairs?: Yes Does the patient have difficulty dressing or bathing?: No Does the patient have difficulty doing errands alone such as visiting a doctor's office or shopping?: No   Assessment & Plan  1. Essential hypertension  bp is towards low end of normal, we will adjust medication, go down on Norvasc from 49m to 2.5 mg and go up on Losartan to 100 mg - amLODipine (NORVASC) 2.5 MG tablet; Take 1 tablet (2.5 mg total) by mouth daily.  Dispense: 90 tablet; Refill: 1 - losartan (COZAAR) 100 MG tablet; Take 1 tablet (100 mg total) by mouth daily.  Dispense: 90 tablet; Refill: 1  2. HLD (hyperlipidemia)  - atorvastatin (LIPITOR) 40 MG tablet; Take 1 tablet (40 mg total) by mouth daily.  Dispense: 90 tablet; Refill: 1  3. Osteoarthritis of both knees, unspecified osteoarthritis type  - gabapentin (NEURONTIN) 300 MG capsule; Take 1 capsule (300 mg total) by mouth 2 (two) times daily.  Dispense: 180 capsule; Refill: 1  Using a walker since revision of left knee   4. Neck pain  - gabapentin (NEURONTIN) 300 MG capsule; Take 1 capsule (300 mg total) by mouth 2 (two) times daily.  Dispense: 180 capsule; Refill: 1  5. GERD without esophagitis  - ranitidine (ZANTAC) 150 MG tablet; Take 1 tablet (150 mg total) by mouth 2 (two) times daily.  Dispense: 180 tablet; Refill: 1   6. Anemia, unspecified type  Likely from surgery, level improved from 10 unitl Nov, she would like to hold off on rechecking labs until next visit

## 2017-03-06 DIAGNOSIS — Z96652 Presence of left artificial knee joint: Secondary | ICD-10-CM | POA: Insufficient documentation

## 2017-04-07 ENCOUNTER — Ambulatory Visit: Payer: Medicare Other | Admitting: Family Medicine

## 2017-04-23 ENCOUNTER — Telehealth: Payer: Self-pay | Admitting: Family Medicine

## 2017-04-23 NOTE — Telephone Encounter (Signed)
Copied from [facility] [id number]. Topic: Quick Communication - See Telephone Encounter >> Apr 23, 2017 12:55 PM Cleaster Corin, NT wrote: CRM for notification. See Telephone encounter for:   04/23/17. HR director dorris hill calling needing FMLA paper sent to her again Fax number [phone]

## 2017-04-26 ENCOUNTER — Encounter: Payer: Self-pay | Admitting: Family Medicine

## 2017-04-26 ENCOUNTER — Ambulatory Visit: Payer: Medicare Other | Admitting: Family Medicine

## 2017-04-26 VITALS — BP 118/54 | HR 92 | Temp 97.9°F | Resp 16 | Ht 62.0 in | Wt 138.2 lb

## 2017-04-26 DIAGNOSIS — K219 Gastro-esophageal reflux disease without esophagitis: Secondary | ICD-10-CM | POA: Diagnosis not present

## 2017-04-26 DIAGNOSIS — I10 Essential (primary) hypertension: Secondary | ICD-10-CM

## 2017-04-26 DIAGNOSIS — M7541 Impingement syndrome of right shoulder: Secondary | ICD-10-CM | POA: Diagnosis not present

## 2017-04-26 MED ORDER — PANTOPRAZOLE SODIUM 40 MG PO TBEC: 40.0000 mg | DELAYED_RELEASE_TABLET | Freq: Every day | ORAL | 1 refills | Status: DC

## 2017-04-26 MED ORDER — LOSARTAN POTASSIUM 100 MG PO TABS: 50.0000 mg | ORAL_TABLET | Freq: Every day | ORAL | 0 refills | Status: DC

## 2017-04-26 NOTE — Progress Notes (Signed)
Name: Catherine Burgess   MRN: [id number]    DOB: February 13, 1935   Date:04/26/2017       Progress Note  Subjective  Chief Complaint  Chief Complaint  Patient presents with  . Arm Pain    Onset-couple of months, Right Arm constantly hurts-has been taking Tylenol, Tramadol and was prescribed Prednisone which only helped periodically. Symptoms always come back- doctor at Urgent Care throught it was arthritis.     HPI  Right shoulder pain: she states over the past few months, shortly after she start PT for left knee revision surgery. She was using a walker first and switched to cane on right hand. She denies falls, or trauma that she can recall. Pain was getting progressively worse so she went to Urgent care and was given diagnosed of left shoulder arthritis on 03/05/2017 , she was given a prednisone taper for one week, and symptoms improved a little but still present. She states pain is worse with movement of shoulder, and also feels a pulling sensation with lateral bending or extension of neck, pain is sharp, shooting and sometimes tingling. No weakness on arm. No swelling, pain is worse on top of right shoulder. She had X-ray done at urgent care, that per patient showed OA, however result not on epic for review.   HTN: bp is towards low end of normal, she has lost some weight since surgery, we will adjust losartan to half pill daily and continue to monitor bp at home. No dizziness, chest pain or palpitation    GERD: she states Ranitidine is not working, she has noticed regurgitation and heartburn since she stopped pantoprazole, she would like to go back on PPI  Patient Active Problem List   Diagnosis Date Noted  . S/P revision of total knee, left 03/06/2017  . Abdominal pain 01/24/2017  . Volvulus ([place])   . S/P total knee arthroplasty 12/07/2016  . History of anemia 10/13/2016  . Syncope 07/23/2016  . Malignant essential hypertension 07/23/2016  . Hypokalemia 07/23/2016  . Abnormal CT of  brain 07/23/2016  . Vitamin D deficiency 10/28/2015  . Post menopausal syndrome 10/28/2015  . OP (osteoporosis) 10/28/2015  . Cervical disc disease 10/28/2015  . H/O total knee replacement 01/20/2015  . Vaginal atrophy 09/26/2014  . Osteoarthritis of both knees 07/31/2014  . GERD without esophagitis 07/31/2014  . HLD (hyperlipidemia) 07/31/2014  . Hypertension, benign 07/31/2014  . History of artificial joint 05/18/2013    Past Surgical History:  Procedure Laterality Date  . CATARACT EXTRACTION, BILATERAL    . HIP PINNING Left 1956  . REPLACEMENT TOTAL KNEE Left 11/12/2009  . TOTAL KNEE REVISION Left 12/07/2016   Procedure: TOTAL KNEE REVISION;  Surgeon: Dereck Leep, MD;  Location: [facility] ORS;  Service: Orthopedics;  Laterality: Left;    Family History  Problem Relation Age of Onset  . Emphysema Father        Smoker  . CAD Mother   . Stroke Mother   . Diabetes Brother   . Seizures Brother   . Cancer Brother   . Diabetes Brother     Social History   Socioeconomic History  . Marital status: Single    Spouse name: Not on file  . Number of children: Not on file  . Years of education: Not on file  . Highest education level: Not on file  Social Needs  . Financial resource strain: Not on file  . Food insecurity - worry: Not on file  . Food insecurity -  inability: Not on file  . Transportation needs - medical: Not on file  . Transportation needs - non-medical: Not on file  Occupational History  . Not on file  Tobacco Use  . Smoking status: Former Smoker    Last attempt to quit: 1966    Years since quitting: 53.2  . Smokeless tobacco: Never Used  Substance and Sexual Activity  . Alcohol use: No    Alcohol/week: 0.0 oz  . Drug use: No  . Sexual activity: Not Currently  Other Topics Concern  . Not on file  Social History Narrative  . Not on file     Current Outpatient Medications:  .  acetaminophen (TYLENOL) 500 MG tablet, Take 1 tablet (500 mg total) by  mouth 2 (two) times daily. (Patient taking differently: Take 500 mg by mouth every 6 (six) hours as needed for mild pain. ), Disp: 180 tablet, Rfl: 0 .  amLODipine (NORVASC) 2.5 MG tablet, Take 1 tablet (2.5 mg total) by mouth daily., Disp: 90 tablet, Rfl: 1 .  aspirin EC 81 MG tablet, Take 81 mg by mouth., Disp: , Rfl:  .  atorvastatin (LIPITOR) 40 MG tablet, Take 1 tablet (40 mg total) by mouth daily., Disp: 90 tablet, Rfl: 1 .  Calcium-Vitamin D 600-200 MG-UNIT tablet, Take 1 tablet by mouth daily. , Disp: , Rfl:  .  gabapentin (NEURONTIN) 300 MG capsule, Take 1 capsule (300 mg total) by mouth 2 (two) times daily., Disp: 180 capsule, Rfl: 1 .  losartan (COZAAR) 100 MG tablet, Take 0.5 tablets (50 mg total) by mouth daily., Disp: 90 tablet, Rfl: 0 .  pantoprazole (PROTONIX) 40 MG tablet, Take 1 tablet (40 mg total) by mouth daily., Disp: 90 tablet, Rfl: 1 .  polyethylene glycol (MIRALAX / GLYCOLAX) packet, Take 17 g by mouth daily., Disp: 100 each, Rfl: 0 .  traMADol (ULTRAM) 50 MG tablet, Take 1-2 tablets (50-100 mg total) by mouth every 4 (four) hours as needed for moderate pain., Disp: 30 tablet, Rfl: 0 .  ranitidine (ZANTAC) 150 MG tablet, Take 1 tablet (150 mg total) by mouth 2 (two) times daily. (Patient not taking: Reported on 04/26/2017), Disp: 180 tablet, Rfl: 1  Allergies  Allergen Reactions  . Lovastatin Other (See Comments)    chest  . Pravastatin     Other reaction(s): Muscle Pain chest  . Rosuvastatin     Other reaction(s): Muscle Pain chest Other reaction(s): Muscle Pain chest  . Statins     Other reaction(s): Muscle Pain chest     ROS  Constitutional: Negative for fever positive for mild  weight change.  Respiratory: Negative for cough and shortness of breath.   Cardiovascular: Negative for chest pain or palpitations.  Gastrointestinal: Negative for abdominal pain, no bowel changes.  Musculoskeletal: Positive  for gait problem - uses a cane,  And intermittent left  knee joint swelling.  Skin: Negative for rash.   Neurological: Negative for dizziness or headache.  No other specific complaints in a complete review of systems (except as listed in HPI above).  Objective  Vitals:   04/26/17 1430  BP: (!) 118/54  Pulse: 92  Resp: 16  Temp: 97.9 F (36.6 C)  TempSrc: Oral  SpO2: 97%  Weight: 138 lb 3.2 oz (62.7 kg)  Height: 5\' 2"  (1.575 m)    Body mass index is 25.28 kg/m.  Physical Exam  Constitutional: Patient appears well-developed and well-nourished. No distress.  HEENT: head atraumatic, normocephalic, pupils equal and reactive to light,  neck supple, throat within normal limits Cardiovascular: Normal rate, regular rhythm and normal heart sounds.  No murmur heard. No BLE edema. Pulmonary/Chest: Effort normal and breath sounds normal. No respiratory distress. Abdominal: Soft.  There is no tenderness. Muscular Skeletal: positive impingement sign on right side, also pain with internal rotation of right shoulder and during palpation of acromioclavicular joint, no hang atrophy or weakness noticed  Psychiatric: Patient has a normal mood and affect. behavior is normal. Judgment and thought content normal.  PHQ2/9: Depression screen [facility] 2/9 03/01/2017 10/05/2016 07/01/2016 03/05/2016 12/17/2015  Decreased Interest 0 0 0 0 0  Down, Depressed, Hopeless 0 0 0 0 0  PHQ - 2 Score 0 0 0 0 0     Fall Risk: Fall Risk  03/01/2017 01/08/2017 10/05/2016 07/01/2016 03/05/2016  Falls in the past year? No No No No No  Number falls in past yr: - - - - -  Injury with Fall? - - - - -  Follow up - - - - -    Assessment & Plan  1. Subacromial impingement of right shoulder  - Ambulatory referral to Orthopedic Surgery  2. Essential hypertension  - losartan (COZAAR) 100 MG tablet; Take 0.5 tablets (50 mg total) by mouth daily.  Dispense: 90 tablet; Refill: 0  3. GERD without esophagitis  - pantoprazole (PROTONIX) 40 MG tablet; Take 1 tablet (40 mg total) by  mouth daily.  Dispense: 90 tablet; Refill: 1

## 2017-04-26 NOTE — Patient Instructions (Signed)
Shoulder Exercises Ask your health care provider which exercises are safe for you. Do exercises exactly as told by your health care provider and adjust them as directed. It is normal to feel mild stretching, pulling, tightness, or discomfort as you do these exercises, but you should stop right away if you feel sudden pain or your pain gets worse.Do not begin these exercises until told by your health care provider. RANGE OF MOTION EXERCISES These exercises warm up your muscles and joints and improve the movement and flexibility of your shoulder. These exercises also help to relieve pain, numbness, and tingling. These exercises involve stretching your injured shoulder directly. Exercise A: Pendulum  1. Stand near a wall or a surface that you can hold onto for balance. 2. Bend at the waist and let your left / right arm hang straight down. Use your other arm to support you. Keep your back straight and do not lock your knees. 3. Relax your left / right arm and shoulder muscles, and move your hips and your trunk so your left / right arm swings freely. Your arm should swing because of the motion of your body, not because you are using your arm or shoulder muscles. 4. Keep moving your body so your arm swings in the following directions, as told by your health care provider: ? Side to side. ? Forward and backward. ? In clockwise and counterclockwise circles. 5. Continue each motion for __________ seconds, or for as long as told by your health care provider. 6. Slowly return to the starting position. Repeat __________ times. Complete this exercise __________ times a day. Exercise B:Flexion, Standing  1. Stand and hold a broomstick, a cane, or a similar object. Place your hands a little more than shoulder-width apart on the object. Your left / right hand should be palm-up, and your other hand should be palm-down. 2. Keep your elbow straight and keep your shoulder muscles relaxed. Push the stick down with  your healthy arm to raise your left / right arm in front of your body, and then over your head until you feel a stretch in your shoulder. ? Avoid shrugging your shoulder while you raise your arm. Keep your shoulder blade tucked down toward the middle of your back. 3. Hold for __________ seconds. 4. Slowly return to the starting position. Repeat __________ times. Complete this exercise __________ times a day. Exercise C: Abduction, Standing 1. Stand and hold a broomstick, a cane, or a similar object. Place your hands a little more than shoulder-width apart on the object. Your left / right hand should be palm-up, and your other hand should be palm-down. 2. While keeping your elbow straight and your shoulder muscles relaxed, push the stick across your body toward your left / right side. Raise your left / right arm to the side of your body and then over your head until you feel a stretch in your shoulder. ? Do not raise your arm above shoulder height, unless your health care provider tells you to do that. ? Avoid shrugging your shoulder while you raise your arm. Keep your shoulder blade tucked down toward the middle of your back. 3. Hold for __________ seconds. 4. Slowly return to the starting position. Repeat __________ times. Complete this exercise __________ times a day. Exercise D:Internal Rotation  1. Place your left / right hand behind your back, palm-up. 2. Use your other hand to dangle an exercise band, a towel, or a similar object over your shoulder. Grasp the band with   your left / right hand so you are holding onto both ends. 3. Gently pull up on the band until you feel a stretch in the front of your left / right shoulder. ? Avoid shrugging your shoulder while you raise your arm. Keep your shoulder blade tucked down toward the middle of your back. 4. Hold for __________ seconds. 5. Release the stretch by letting go of the band and lowering your hands. Repeat __________ times. Complete  this exercise __________ times a day. STRETCHING EXERCISES These exercises warm up your muscles and joints and improve the movement and flexibility of your shoulder. These exercises also help to relieve pain, numbness, and tingling. These exercises are done using your healthy shoulder to help stretch the muscles of your injured shoulder. Exercise E: Corner Stretch (External Rotation and Abduction)  1. Stand in a doorway with one of your feet slightly in front of the other. This is called a staggered stance. If you cannot reach your forearms to the door frame, stand facing a corner of a room. 2. Choose one of the following positions as told by your health care provider: ? Place your hands and forearms on the door frame above your head. ? Place your hands and forearms on the door frame at the height of your head. ? Place your hands on the door frame at the height of your elbows. 3. Slowly move your weight onto your front foot until you feel a stretch across your chest and in the front of your shoulders. Keep your head and chest upright and keep your abdominal muscles tight. 4. Hold for __________ seconds. 5. To release the stretch, shift your weight to your back foot. Repeat __________ times. Complete this stretch __________ times a day. Exercise F:Extension, Standing 1. Stand and hold a broomstick, a cane, or a similar object behind your back. ? Your hands should be a little wider than shoulder-width apart. ? Your palms should face away from your back. 2. Keeping your elbows straight and keeping your shoulder muscles relaxed, move the stick away from your body until you feel a stretch in your shoulder. ? Avoid shrugging your shoulders while you move the stick. Keep your shoulder blade tucked down toward the middle of your back. 3. Hold for __________ seconds. 4. Slowly return to the starting position. Repeat __________ times. Complete this exercise __________ times a day. STRENGTHENING  EXERCISES These exercises build strength and endurance in your shoulder. Endurance is the ability to use your muscles for a long time, even after they get tired. Exercise G:External Rotation  1. Sit in a stable chair without armrests. 2. Secure an exercise band at elbow height on your left / right side. 3. Place a soft object, such as a folded towel or a small pillow, between your left / right upper arm and your body to move your elbow a few inches away (about 10 cm) from your side. 4. Hold the end of the band so it is tight and there is no slack. 5. Keeping your elbow pressed against the soft object, move your left / right forearm out, away from your abdomen. Keep your body steady so only your forearm moves. 6. Hold for __________ seconds. 7. Slowly return to the starting position. Repeat __________ times. Complete this exercise __________ times a day. Exercise H:Shoulder Abduction  1. Sit in a stable chair without armrests, or stand. 2. Hold a __________ weight in your left / right hand, or hold an exercise band with both hands.   3. Start with your arms straight down and your left / right palm facing in, toward your body. 4. Slowly lift your left / right hand out to your side. Do not lift your hand above shoulder height unless your health care provider tells you that this is safe. ? Keep your arms straight. ? Avoid shrugging your shoulder while you do this movement. Keep your shoulder blade tucked down toward the middle of your back. 5. Hold for __________ seconds. 6. Slowly lower your arm, and return to the starting position. Repeat __________ times. Complete this exercise __________ times a day. Exercise I:Shoulder Extension 1. Sit in a stable chair without armrests, or stand. 2. Secure an exercise band to a stable object in front of you where it is at shoulder height. 3. Hold one end of the exercise band in each hand. Your palms should face each other. 4. Straighten your elbows and  lift your hands up to shoulder height. 5. Step back, away from the secured end of the exercise band, until the band is tight and there is no slack. 6. Squeeze your shoulder blades together as you pull your hands down to the sides of your thighs. Stop when your hands are straight down by your sides. Do not let your hands go behind your body. 7. Hold for __________ seconds. 8. Slowly return to the starting position. Repeat __________ times. Complete this exercise __________ times a day. Exercise J:Standing Shoulder Row 1. Sit in a stable chair without armrests, or stand. 2. Secure an exercise band to a stable object in front of you so it is at waist height. 3. Hold one end of the exercise band in each hand. Your palms should be in a thumbs-up position. 4. Bend each of your elbows to an "L" shape (about 90 degrees) and keep your upper arms at your sides. 5. Step back until the band is tight and there is no slack. 6. Slowly pull your elbows back behind you. 7. Hold for __________ seconds. 8. Slowly return to the starting position. Repeat __________ times. Complete this exercise __________ times a day. Exercise K:Shoulder Press-Ups  1. Sit in a stable chair that has armrests. Sit upright, with your feet flat on the floor. 2. Put your hands on the armrests so your elbows are bent and your fingers are pointing forward. Your hands should be about even with the sides of your body. 3. Push down on the armrests and use your arms to lift yourself off of the chair. Straighten your elbows and lift yourself up as much as you comfortably can. ? Move your shoulder blades down, and avoid letting your shoulders move up toward your ears. ? Keep your feet on the ground. As you get stronger, your feet should support less of your body weight as you lift yourself up. 4. Hold for __________ seconds. 5. Slowly lower yourself back into the chair. Repeat __________ times. Complete this exercise __________ times a  day. Exercise L: Wall Push-Ups  1. Stand so you are facing a stable wall. Your feet should be about one arm-length away from the wall. 2. Lean forward and place your palms on the wall at shoulder height. 3. Keep your feet flat on the floor as you bend your elbows and lean forward toward the wall. 4. Hold for __________ seconds. 5. Straighten your elbows to push yourself back to the starting position. Repeat __________ times. Complete this exercise __________ times a day. This information is not intended to replace advice   given to you by your health care provider. Make sure you discuss any questions you have with your health care provider. Document Released: 12/10/2004 Document Revised: 10/21/2015 Document Reviewed: 10/07/2014 [employer] Interactive Patient Education  2018 [employer]  

## 2017-04-26 NOTE — Telephone Encounter (Signed)
Patient came in for appointment and it was explained to her that we never received any FMLA paperwork and never completed paperwork.

## 2017-06-13 ENCOUNTER — Encounter: Payer: Self-pay | Admitting: Emergency Medicine

## 2017-06-13 ENCOUNTER — Emergency Department: Payer: Medicare Other

## 2017-06-13 ENCOUNTER — Other Ambulatory Visit: Payer: Self-pay

## 2017-06-13 ENCOUNTER — Emergency Department
Admission: EM | Admit: 2017-06-13 | Discharge: 2017-06-13 | Disposition: A | Discharge status: Home / Self Care | Payer: Medicare Other | Attending: Emergency Medicine | Admitting: Emergency Medicine

## 2017-06-13 DIAGNOSIS — N390 Urinary tract infection, site not specified: Secondary | ICD-10-CM | POA: Diagnosis not present

## 2017-06-13 DIAGNOSIS — R42 Dizziness and giddiness: Secondary | ICD-10-CM | POA: Diagnosis not present

## 2017-06-13 DIAGNOSIS — I10 Essential (primary) hypertension: Secondary | ICD-10-CM | POA: Insufficient documentation

## 2017-06-13 DIAGNOSIS — Z96652 Presence of left artificial knee joint: Secondary | ICD-10-CM | POA: Diagnosis not present

## 2017-06-13 DIAGNOSIS — R4020 Unspecified coma: Secondary | ICD-10-CM | POA: Diagnosis not present

## 2017-06-13 DIAGNOSIS — Z7982 Long term (current) use of aspirin: Secondary | ICD-10-CM | POA: Diagnosis not present

## 2017-06-13 DIAGNOSIS — Z87891 Personal history of nicotine dependence: Secondary | ICD-10-CM | POA: Diagnosis not present

## 2017-06-13 DIAGNOSIS — Z79899 Other long term (current) drug therapy: Secondary | ICD-10-CM | POA: Insufficient documentation

## 2017-06-13 DIAGNOSIS — R531 Weakness: Secondary | ICD-10-CM | POA: Insufficient documentation

## 2017-06-13 LAB — CBC
HEMATOCRIT: 38.9 % (ref 35.0–47.0)
HEMOGLOBIN: 12.8 g/dL (ref 12.0–16.0)
MCH: 26.6 pg (ref 26.0–34.0)
MCHC: 33.1 g/dL (ref 32.0–36.0)
MCV: 80.5 fL (ref 80.0–100.0)
Platelets: 356 10*3/uL (ref 150–440)
RBC: 4.83 MIL/uL (ref 3.80–5.20)
RDW: 18 % — AB (ref 11.5–14.5)
WBC: 10.1 10*3/uL (ref 3.6–11.0)

## 2017-06-13 LAB — BASIC METABOLIC PANEL
ANION GAP: 8 (ref 5–15)
BUN: 20 mg/dL (ref 6–20)
CHLORIDE: 105 mmol/L (ref 101–111)
CO2: 28 mmol/L (ref 22–32)
Calcium: 9.2 mg/dL (ref 8.9–10.3)
Creatinine, Ser: 0.73 mg/dL (ref 0.44–1.00)
GFR calc Af Amer: >60 mL/min (ref >60)
Glucose, Bld: 148 mg/dL — ABNORMAL HIGH (ref 65–99)
POTASSIUM: 3.6 mmol/L (ref 3.5–5.1)
SODIUM: 141 mmol/L (ref 135–145)

## 2017-06-13 LAB — URINALYSIS, COMPLETE (UACMP) WITH MICROSCOPIC
BILIRUBIN URINE: NEGATIVE
Bacteria, UA: NONE SEEN
GLUCOSE, UA: NEGATIVE mg/dL
Hgb urine dipstick: NEGATIVE
KETONES UR: NEGATIVE mg/dL
NITRITE: NEGATIVE
PROTEIN: NEGATIVE mg/dL
Specific Gravity, Urine: 1.016 (ref 1.005–1.030)
pH: 5 (ref 5.0–8.0)

## 2017-06-13 LAB — TROPONIN I

## 2017-06-13 MED ORDER — SODIUM CHLORIDE 0.9 % IV BOLUS
500.0000 mL | Freq: Once | INTRAVENOUS | Status: AC
  Administered 2017-06-13: 500 mL via INTRAVENOUS

## 2017-06-13 MED ORDER — NITROFURANTOIN MONOHYD MACRO 100 MG PO CAPS: 100.0000 mg | ORAL_CAPSULE | Freq: Two times a day (BID) | ORAL | 0 refills | Status: AC

## 2017-06-13 NOTE — ED Notes (Signed)
Pt up to [facility], states unable to void at this time.

## 2017-06-13 NOTE — ED Triage Notes (Addendum)
Pt says she was eating dinner at home, just prior to arrival, when she became dizzy; says she could hear her friend calling her name but she couldn't answer; vision was normal but she just couldn't speak; pt currently talking in complete coherent sentences but says her head feels "funny"; pt adds her friend says her eyes rolled back in her head;

## 2017-06-13 NOTE — Discharge Instructions (Addendum)
Return to the emergency room for any new or worrisome symptoms.  You would prefer to go home which is not unreasonable, it does limit our ability to monitor you.  Please follow closely with primary care doctor.  If you feel lightheaded have chest pain shortness of breath nausea vomiting diarrhea abdominal pain any bleeding fever chills or any other concerns please return to the department.  Stay with family tonight.  Be sure that you drink plenty of fluids.

## 2017-06-13 NOTE — ED Provider Notes (Addendum)
[facility] Emergency Department Provider Note  ____________________________________________   I have reviewed the triage vital signs and the nursing notes. Where available I have reviewed prior notes and, if possible and indicated, outside hospital notes.    HISTORY  Chief Complaint Dizziness    HPI Catherine Burgess is a 82 y.o. female who presents today complaining of a few minutes of feeling lightheaded.  She did not pass out or have a seizure.  She felt like she was feeling funny.  She denied any seizure activity did not fall did not hit her head.  Patient states she went to church twice today, she usually drinks a lot of water but did not drink any today, and she was spending the entire day when she was in a church in a house that was heated to 80 degrees which the family finds to be very hot.  Patient denies any chest pain shortness of breath nausea vomiting dysuria urinary frequency and at this time she feels fine with no complaints.     Past Medical History:  Diagnosis Date  . Degenerative joint disease   . Hyperlipidemia   . Hypertension   . Osteoarthrosis   . Reflux esophagitis     Patient Active Problem List   Diagnosis Date Noted  . S/P revision of total knee, left 03/06/2017  . Abdominal pain 01/24/2017  . Volvulus ([place])   . S/P total knee arthroplasty 12/07/2016  . History of anemia 10/13/2016  . Syncope 07/23/2016  . Malignant essential hypertension 07/23/2016  . Hypokalemia 07/23/2016  . Abnormal CT of brain 07/23/2016  . Vitamin D deficiency 10/28/2015  . Post menopausal syndrome 10/28/2015  . OP (osteoporosis) 10/28/2015  . Cervical disc disease 10/28/2015  . H/O total knee replacement 01/20/2015  . Vaginal atrophy 09/26/2014  . Osteoarthritis of both knees 07/31/2014  . GERD without esophagitis 07/31/2014  . HLD (hyperlipidemia) 07/31/2014  . Hypertension, benign 07/31/2014  . History of artificial joint 05/18/2013     Past Surgical History:  Procedure Laterality Date  . CATARACT EXTRACTION, BILATERAL    . HIP PINNING Left 1956  . REPLACEMENT TOTAL KNEE Left 11/12/2009  . TOTAL KNEE REVISION Left 12/07/2016   Procedure: TOTAL KNEE REVISION;  Surgeon: Dereck Leep, MD;  Location: [facility] ORS;  Service: Orthopedics;  Laterality: Left;    Prior to Admission medications   Medication Sig Start Date End Date Taking? Authorizing Provider  acetaminophen (TYLENOL) 500 MG tablet Take 1 tablet (500 mg total) by mouth 2 (two) times daily. Patient taking differently: Take 500 mg by mouth every 6 (six) hours as needed for mild pain.  10/28/15   Steele Sizer, MD  amLODipine (NORVASC) 2.5 MG tablet Take 1 tablet (2.5 mg total) by mouth daily. 03/01/17   Steele Sizer, MD  aspirin EC 81 MG tablet Take 81 mg by mouth.    [provider]  atorvastatin (LIPITOR) 40 MG tablet Take 1 tablet (40 mg total) by mouth daily. 03/01/17   Steele Sizer, MD  Calcium-Vitamin D 600-200 MG-UNIT tablet Take 1 tablet by mouth daily.     [provider]  gabapentin (NEURONTIN) 300 MG capsule Take 1 capsule (300 mg total) by mouth 2 (two) times daily. 03/01/17   Steele Sizer, MD  losartan (COZAAR) 100 MG tablet Take 0.5 tablets (50 mg total) by mouth daily. 04/26/17   Steele Sizer, MD  pantoprazole (PROTONIX) 40 MG tablet Take 1 tablet (40 mg total) by mouth daily. 04/26/17  Steele Sizer, MD  polyethylene glycol (MIRALAX / GLYCOLAX) packet Take 17 g by mouth daily. 03/01/17   Steele Sizer, MD  traMADol (ULTRAM) 50 MG tablet Take 1-2 tablets (50-100 mg total) by mouth every 4 (four) hours as needed for moderate pain. 12/08/16   Watt Climes, PA    Allergies Lovastatin; Pravastatin; Rosuvastatin; and Statins  Family History  Problem Relation Age of Onset  . Emphysema Father        Smoker  . CAD Mother   . Stroke Mother   . Diabetes Brother   . Seizures Brother   . Cancer Brother   . Diabetes  Brother     Social History Social History   Tobacco Use  . Smoking status: Former Smoker    Last attempt to quit: 1966    Years since quitting: 53.3  . Smokeless tobacco: Never Used  Substance Use Topics  . Alcohol use: No    Alcohol/week: 0.0 oz  . Drug use: No    Review of Systems Constitutional: No fever/chills Eyes: No visual changes. ENT: No sore throat. No stiff neck no neck pain Cardiovascular: Denies chest pain. Respiratory: Denies shortness of breath. Gastrointestinal:   no vomiting.  No diarrhea.  No constipation. Genitourinary: Negative for dysuria. Musculoskeletal: Negative lower extremity swelling Skin: Negative for rash. Neurological: Negative for severe headaches, focal weakness or numbness.   ____________________________________________   PHYSICAL EXAM:  VITAL SIGNS: ED Triage Vitals  Enc Vitals Group     BP 06/13/17 2011 120/67     Pulse Rate 06/13/17 2011 76     Resp 06/13/17 2011 18     Temp 06/13/17 2011 98.1 F (36.7 C)     Temp Source 06/13/17 2011 Oral     SpO2 06/13/17 2011 97 %     Weight 06/13/17 2013 120 lb (54.4 kg)     Height 06/13/17 2013 5\' 3"  (1.6 m)     Head Circumference --      Peak Flow --      Pain Score 06/13/17 2013 0     Pain Loc --      Pain Edu? --      Excl. in [place]? --     Constitutional: Alert and oriented. Well appearing and in no acute distress. Eyes: Conjunctivae are normal Head: Atraumatic HEENT: No congestion/rhinnorhea. Mucous membranes are moist.  Oropharynx non-erythematous Neck:   Nontender with no meningismus, no masses, no stridor Cardiovascular: Normal rate, regular rhythm. Grossly normal heart sounds.  Good peripheral circulation. Respiratory: Normal respiratory effort.  No retractions. Lungs CTAB. Abdominal: Soft and nontender. No distention. No guarding no rebound Back:  There is no focal tenderness or step off.  there is no midline tenderness there are no lesions noted. there is no CVA  tenderness Musculoskeletal: No lower extremity tenderness, no upper extremity tenderness. No joint effusions, no DVT signs strong distal pulses no edema Neurologic:  Normal speech and language. No gross focal neurologic deficits are appreciated.  Skin:  Skin is warm, dry and intact. No rash noted. Psychiatric: Mood and affect are normal. Speech and behavior are normal.  ____________________________________________   LABS (all labs ordered are listed, but only abnormal results are displayed)  Labs Reviewed  CBC - Abnormal; Notable for the following components:      Result Value   RDW 18.0 (*)    All other components within normal limits  BASIC METABOLIC PANEL  URINALYSIS, COMPLETE (UACMP) WITH MICROSCOPIC  TROPONIN I  CBG MONITORING,  ED    Pertinent labs  results that were available during my care of the patient were reviewed by me and considered in my medical decision making (see chart for details). ____________________________________________  EKG  I personally interpreted any EKGs ordered by me or triage Normal sinus rhythm, rate 77 bpm no acute ST elevation or depression, normal axis unremarkable EKG ____________________________________________  RADIOLOGY  Pertinent labs & imaging results that were available during my care of the patient were reviewed by me and considered in my medical decision making (see chart for details). If possible, patient and/or family made aware of any abnormal findings.  No results found. ____________________________________________    PROCEDURES  Procedure(s) performed: None  Procedures  Critical Care performed: None  ____________________________________________   INITIAL IMPRESSION / ASSESSMENT AND PLAN / ED COURSE  Pertinent labs & imaging results that were available during my care of the patient were reviewed by me and considered in my medical decision making (see chart for details).  Patient here with presyncopal symptoms after  a busy day of going to church and staying in a very hot house without eating or drinking.  Neurologically her NIH stroke scale is 0 her abdomen is benign her lungs are clear her vitals are normal, I suspect she just overdid it today with insufficient p.o.  We are giving her IV fluids she has no complaints at this time will closely watch her.  ----------------------------------------- 10:49 PM on 06/13/2017 -----------------------------------------  Laughing and joking in the room still looks remarkably well has no complaints at no time had any chest pain work-up is very reassuring here.  She is given [place] a urine sample she feels fine.  I have offered admission but patient and family much prefer to go home.  Do not think this is unreasonable.  They will stay with her tonight.  She will follow closely with Dr. tomorrow.  We will check orthostatics prior to discharge.  Urinalysis is pending.    ____________________________________________   FINAL CLINICAL IMPRESSION(S) / ED DIAGNOSES  Final diagnoses:  None      This chart was dictated using voice recognition software.  Despite best efforts to proofread,  errors can occur which can change meaning.      Schuyler Amor, MD 06/13/17 2121    Schuyler Amor, MD 06/13/17 2250

## 2017-06-15 LAB — URINE CULTURE

## 2017-06-28 DIAGNOSIS — J069 Acute upper respiratory infection, unspecified: Secondary | ICD-10-CM | POA: Diagnosis not present

## 2017-06-29 ENCOUNTER — Ambulatory Visit: Payer: Medicare Other | Admitting: Family Medicine

## 2017-07-01 ENCOUNTER — Ambulatory Visit: Payer: Medicare Other | Admitting: Family Medicine

## 2017-07-02 ENCOUNTER — Encounter: Payer: Self-pay | Admitting: Nurse Practitioner

## 2017-07-02 ENCOUNTER — Ambulatory Visit (INDEPENDENT_AMBULATORY_CARE_PROVIDER_SITE_OTHER): Payer: Medicare Other | Admitting: Nurse Practitioner

## 2017-07-02 VITALS — BP 124/76 | HR 87 | Temp 98.1°F | Resp 16 | Ht 62.0 in | Wt 137.0 lb

## 2017-07-02 DIAGNOSIS — E559 Vitamin D deficiency, unspecified: Secondary | ICD-10-CM

## 2017-07-02 DIAGNOSIS — Z5181 Encounter for therapeutic drug level monitoring: Secondary | ICD-10-CM

## 2017-07-02 DIAGNOSIS — M542 Cervicalgia: Secondary | ICD-10-CM | POA: Diagnosis not present

## 2017-07-02 DIAGNOSIS — I10 Essential (primary) hypertension: Secondary | ICD-10-CM | POA: Diagnosis not present

## 2017-07-02 DIAGNOSIS — E782 Mixed hyperlipidemia: Secondary | ICD-10-CM

## 2017-07-02 DIAGNOSIS — M170 Bilateral primary osteoarthritis of knee: Secondary | ICD-10-CM

## 2017-07-02 MED ORDER — ATORVASTATIN CALCIUM 40 MG PO TABS: 40.0000 mg | ORAL_TABLET | Freq: Every day | ORAL | 1 refills | Status: DC

## 2017-07-02 MED ORDER — GABAPENTIN 300 MG PO CAPS: 300.0000 mg | ORAL_CAPSULE | Freq: Two times a day (BID) | ORAL | 1 refills | Status: DC

## 2017-07-02 NOTE — Patient Instructions (Signed)
DASH Eating Plan DASH stands for "Dietary Approaches to Stop Hypertension." The DASH eating plan is a healthy eating plan that has been shown to reduce high blood pressure (hypertension). It may also reduce your risk for type 2 diabetes, heart disease, and stroke. The DASH eating plan may also help with weight loss. What are tips for following this plan? General guidelines  Avoid eating more than 2,300 mg (milligrams) of salt (sodium) a day. If you have hypertension, you may need to reduce your sodium intake to 1,500 mg a day.  Limit alcohol intake to no more than 1 drink a day for nonpregnant women and 2 drinks a day for men. One drink equals 12 oz of beer, 5 oz of wine, or 1 oz of hard liquor.  Work with your health care provider to maintain a healthy body weight or to lose weight. Ask what an ideal weight is for you.  Get at least 30 minutes of exercise that causes your heart to beat faster (aerobic exercise) most days of the week. Activities may include walking, swimming, or biking.  Work with your health care provider or diet and nutrition specialist (dietitian) to adjust your eating plan to your individual calorie needs. Reading food labels  Check food labels for the amount of sodium per serving. Choose foods with less than 5 percent of the Daily Value of sodium. Generally, foods with less than 300 mg of sodium per serving fit into this eating plan.  To find whole grains, look for the word "whole" as the first word in the ingredient list. Shopping  Buy products labeled as "low-sodium" or "no salt added."  Buy fresh foods. Avoid canned foods and premade or frozen meals. Cooking  Avoid adding salt when cooking. Use salt-free seasonings or herbs instead of table salt or sea salt. Check with your health care provider or pharmacist before using salt substitutes.  Do not fry foods. Cook foods using healthy methods such as baking, boiling, grilling, and broiling instead.  Cook with  heart-healthy oils, such as olive, canola, soybean, or sunflower oil. Meal planning   Eat a balanced diet that includes: ? 5 or more servings of fruits and vegetables each day. At each meal, try to fill half of your plate with fruits and vegetables. ? Up to 6-8 servings of whole grains each day. ? Less than 6 oz of lean meat, poultry, or fish each day. A 3-oz serving of meat is about the same size as a deck of cards. One egg equals 1 oz. ? 2 servings of low-fat dairy each day. ? A serving of nuts, seeds, or beans 5 times each week. ? Heart-healthy fats. Healthy fats called Omega-3 fatty acids are found in foods such as flaxseeds and coldwater fish, like sardines, salmon, and mackerel.  Limit how much you eat of the following: ? Canned or prepackaged foods. ? Food that is high in trans fat, such as fried foods. ? Food that is high in saturated fat, such as fatty meat. ? Sweets, desserts, sugary drinks, and other foods with added sugar. ? Full-fat dairy products.  Do not salt foods before eating.  Try to eat at least 2 vegetarian meals each week.  Eat more home-cooked food and less restaurant, buffet, and fast food.  When eating at a restaurant, ask that your food be prepared with less salt or no salt, if possible. What foods are recommended? The items listed may not be a complete list. Talk with your dietitian about what   dietary choices are best for you. Grains Whole-grain or whole-wheat bread. Whole-grain or whole-wheat pasta. Brown rice. Oatmeal. Quinoa. Bulgur. Whole-grain and low-sodium cereals. Pita bread. Low-fat, low-sodium crackers. Whole-wheat flour tortillas. Vegetables Fresh or frozen vegetables (raw, steamed, roasted, or grilled). Low-sodium or reduced-sodium tomato and vegetable juice. Low-sodium or reduced-sodium tomato sauce and tomato paste. Low-sodium or reduced-sodium canned vegetables. Fruits All fresh, dried, or frozen fruit. Canned fruit in natural juice (without  added sugar). Meat and other protein foods Skinless chicken or turkey. Ground chicken or turkey. Pork with fat trimmed off. Fish and seafood. Egg whites. Dried beans, peas, or lentils. Unsalted nuts, nut butters, and seeds. Unsalted canned beans. Lean cuts of beef with fat trimmed off. Low-sodium, lean deli meat. Dairy Low-fat (1%) or fat-free (skim) milk. Fat-free, low-fat, or reduced-fat cheeses. Nonfat, low-sodium ricotta or cottage cheese. Low-fat or nonfat yogurt. Low-fat, low-sodium cheese. Fats and oils Soft margarine without trans fats. Vegetable oil. Low-fat, reduced-fat, or light mayonnaise and salad dressings (reduced-sodium). Canola, safflower, olive, soybean, and sunflower oils. Avocado. Seasoning and other foods Herbs. Spices. Seasoning mixes without salt. Unsalted popcorn and pretzels. Fat-free sweets. What foods are not recommended? The items listed may not be a complete list. Talk with your dietitian about what dietary choices are best for you. Grains Baked goods made with fat, such as croissants, muffins, or some breads. Dry pasta or rice meal packs. Vegetables Creamed or fried vegetables. Vegetables in a cheese sauce. Regular canned vegetables (not low-sodium or reduced-sodium). Regular canned tomato sauce and paste (not low-sodium or reduced-sodium). Regular tomato and vegetable juice (not low-sodium or reduced-sodium). Pickles. Olives. Fruits Canned fruit in a light or heavy syrup. Fried fruit. Fruit in cream or butter sauce. Meat and other protein foods Fatty cuts of meat. Ribs. Fried meat. Bacon. Sausage. Bologna and other processed lunch meats. Salami. Fatback. Hotdogs. Bratwurst. Salted nuts and seeds. Canned beans with added salt. Canned or smoked fish. Whole eggs or egg yolks. Chicken or turkey with skin. Dairy Whole or 2% milk, cream, and half-and-half. Whole or full-fat cream cheese. Whole-fat or sweetened yogurt. Full-fat cheese. Nondairy creamers. Whipped toppings.  Processed cheese and cheese spreads. Fats and oils Butter. Stick margarine. Lard. Shortening. Ghee. Bacon fat. Tropical oils, such as coconut, palm kernel, or palm oil. Seasoning and other foods Salted popcorn and pretzels. Onion salt, garlic salt, seasoned salt, table salt, and sea salt. Worcestershire sauce. Tartar sauce. Barbecue sauce. Teriyaki sauce. Soy sauce, including reduced-sodium. Steak sauce. Canned and packaged gravies. Fish sauce. Oyster sauce. Cocktail sauce. Horseradish that you find on the shelf. Ketchup. Mustard. Meat flavorings and tenderizers. Bouillon cubes. Hot sauce and Tabasco sauce. Premade or packaged marinades. Premade or packaged taco seasonings. Relishes. Regular salad dressings. Where to find more information:  National Heart, Lung, and Blood Institute: www.nhlbi.nih.gov  American Heart Association: www.heart.org Summary  The DASH eating plan is a healthy eating plan that has been shown to reduce high blood pressure (hypertension). It may also reduce your risk for type 2 diabetes, heart disease, and stroke.  With the DASH eating plan, you should limit salt (sodium) intake to 2,300 mg a day. If you have hypertension, you may need to reduce your sodium intake to 1,500 mg a day.  When on the DASH eating plan, aim to eat more fresh fruits and vegetables, whole grains, lean proteins, low-fat dairy, and heart-healthy fats.  Work with your health care provider or diet and nutrition specialist (dietitian) to adjust your eating plan to your individual   calorie needs. This information is not intended to replace advice given to you by your health care provider. Make sure you discuss any questions you have with your health care provider. Document Released: 01/15/2011 Document Revised: 01/20/2016 Document Reviewed: 01/20/2016 Elsevier Interactive Patient Education  2018 Elsevier Inc.  

## 2017-07-02 NOTE — Progress Notes (Addendum)
Name: Catherine Burgess   MRN: [id number]    DOB: 05-Mar-1935   Date:07/02/2017       Progress Note  Subjective  Chief Complaint  Chief Complaint  Patient presents with  . Hypertension  . Follow-up    3 month recheck  . Hyperlipidemia    medication refill    HPI Was seen in ER for lightheadedness on 5/5- blood work and UA were normal- patient went home. States hasn't had any episodes like that since and has felt fine think she might have got overheated. She recently seen at [facility] for sinusitis taking doxycycline presently- feeling better.   Hypertension Patient taking amlodipine 2.5mg  tables and losartan 50 mg daily, no missed doses. Denies cp, blurry vision, headache, leg swelling  BP Readings from Last 3 Encounters:  07/02/17 124/76  06/13/17 136/77  04/26/17 (!) 118/54    Hyperlipidemia Taking atorvastatin 40mg  daily with no missed doses. Takes it at night.  Lab Results  Component Value Date   CHOL 111 10/13/2016   HDL 46 (L) 10/13/2016   LDLCALC 49 10/13/2016   TRIG 79 10/13/2016   CHOLHDL 2.4 10/13/2016   Osteoarthritis: bilateral knees and right shoulder, has had left TKR and injection in right knee. Sees Dr. Sallee Provencal- ortho for shoulder who recommended pain medication. Patient has been practicing her physical therapy techniques at home and going to the gym.   Patient Active Problem List   Diagnosis Date Noted  . S/P revision of total knee, left 03/06/2017  . Abdominal pain 01/24/2017  . Volvulus ([place])   . S/P total knee arthroplasty 12/07/2016  . History of anemia 10/13/2016  . Syncope 07/23/2016  . Malignant essential hypertension 07/23/2016  . Hypokalemia 07/23/2016  . Abnormal CT of brain 07/23/2016  . Vitamin D deficiency 10/28/2015  . Post menopausal syndrome 10/28/2015  . OP (osteoporosis) 10/28/2015  . Cervical disc disease 10/28/2015  . H/O total knee replacement 01/20/2015  . Vaginal atrophy 09/26/2014  . Osteoarthritis of both knees 07/31/2014  .  GERD without esophagitis 07/31/2014  . HLD (hyperlipidemia) 07/31/2014  . Hypertension, benign 07/31/2014  . History of artificial joint 05/18/2013    Past Medical History:  Diagnosis Date  . Degenerative joint disease   . Hyperlipidemia   . Hypertension   . Osteoarthrosis   . Reflux esophagitis     Past Surgical History:  Procedure Laterality Date  . CATARACT EXTRACTION, BILATERAL    . HIP PINNING Left 1956  . REPLACEMENT TOTAL KNEE Left 11/12/2009  . TOTAL KNEE REVISION Left 12/07/2016   Procedure: TOTAL KNEE REVISION;  Surgeon: Dereck Leep, MD;  Location: [facility] ORS;  Service: Orthopedics;  Laterality: Left;    Social History   Tobacco Use  . Smoking status: Former Smoker    Last attempt to quit: 1966    Years since quitting: 53.4  . Smokeless tobacco: Never Used  Substance Use Topics  . Alcohol use: No    Alcohol/week: 0.0 oz     Current Outpatient Medications:  .  acetaminophen (TYLENOL) 500 MG tablet, Take 1 tablet (500 mg total) by mouth 2 (two) times daily. (Patient taking differently: Take 500 mg by mouth every 6 (six) hours as needed for mild pain. ), Disp: 180 tablet, Rfl: 0 .  amLODipine (NORVASC) 2.5 MG tablet, Take 1 tablet (2.5 mg total) by mouth daily., Disp: 90 tablet, Rfl: 1 .  aspirin EC 81 MG tablet, Take 81 mg by mouth., Disp: , Rfl:  .  atorvastatin (  LIPITOR) 40 MG tablet, Take 1 tablet (40 mg total) by mouth daily., Disp: 90 tablet, Rfl: 1 .  Calcium-Vitamin D 600-200 MG-UNIT tablet, Take 1 tablet by mouth daily. , Disp: , Rfl:  .  gabapentin (NEURONTIN) 300 MG capsule, Take 1 capsule (300 mg total) by mouth 2 (two) times daily., Disp: 180 capsule, Rfl: 1 .  losartan (COZAAR) 100 MG tablet, Take 0.5 tablets (50 mg total) by mouth daily., Disp: 90 tablet, Rfl: 0 .  pantoprazole (PROTONIX) 40 MG tablet, Take 1 tablet (40 mg total) by mouth daily., Disp: 90 tablet, Rfl: 1 .  polyethylene glycol (MIRALAX / GLYCOLAX) packet, Take 17 g by mouth daily.,  Disp: 100 each, Rfl: 0 .  traMADol (ULTRAM) 50 MG tablet, Take 1-2 tablets (50-100 mg total) by mouth every 4 (four) hours as needed for moderate pain., Disp: 30 tablet, Rfl: 0  Allergies  Allergen Reactions  . Lovastatin Other (See Comments)    chest  . Pravastatin     Other reaction(s): Muscle Pain chest  . Rosuvastatin     Other reaction(s): Muscle Pain chest Other reaction(s): Muscle Pain chest  . Statins     Other reaction(s): Muscle Pain chest    ROS  Constitutional: Negative for fever or weight change.  Respiratory: Negative for cough and shortness of breath.   Cardiovascular: Negative for chest pain or palpitations.  Gastrointestinal: Negative for abdominal pain, no bowel changes.  Musculoskeletal: Negative for gait problem or joint swelling.  Skin: Negative for rash.  Neurological: Negative for dizziness or headache.  No other specific complaints in a complete review of systems (except as listed in HPI above).  Objective  Vitals:   07/02/17 1345  BP: 124/76  Pulse: 87  Resp: 16  Temp: 98.1 F (36.7 C)  TempSrc: Oral  SpO2: 98%  Weight: 137 lb (62.1 kg)  Height: 5\' 2"  (1.575 m)    Body mass index is 25.06 kg/m.  Nursing Note and Vital Signs reviewed.  Physical Exam  Constitutional: Patient appears well-developed and well-nourished.  No distress.  HEENT: head atraumatic, normocephalic, pupils equal and reactive to light,  no maxillary or frontal sinus tenderness,oropharynx pink and moist without exudate, no nasal discharge Cardiovascular: Normal rate, regular rhythm, S1/S2 present.  No murmur or rub heard.  Pulmonary/Chest: Effort normal and breath sounds clear. No respiratory distress or retractions. Abdominal: Soft and non-tender, bowel sounds present, no masses palpated  Psychiatric: Patient has a normal mood and affect. behavior is normal. Judgment and thought content normal.  No results found for this or any previous visit (from the past 72  hour(s)).  Assessment & Plan  1. Essential hypertension Stable continue meds, dash diet  - COMPLETE METABOLIC PANEL WITH GFR  2. Mixed hyperlipidemia - Patient ate sausage biscuit and nabs ar 8am- labs collected around 3pm (non-fasting) - Lipid Profile - atorvastatin (LIPITOR) 40 MG tablet; Take 1 tablet (40 mg total) by mouth daily.  Dispense: 90 tablet; Refill: 1  3. Primary osteoarthritis of both knees - pt exercise, ortho follow up   4. Vitamin D deficiency Continue supplements  - Vitamin D (25 hydroxy)  5. Medication monitoring encounter  - COMPLETE METABOLIC PANEL WITH GFR  6. Osteoarthritis of both knees, unspecified osteoarthritis type -ortho follow up  - gabapentin (NEURONTIN) 300 MG capsule; Take 1 capsule (300 mg total) by mouth 2 (two) times daily.  Dispense: 180 capsule; Refill: 1  7. Neck pain -ortho follow up  - gabapentin (NEURONTIN) 300 MG  capsule; Take 1 capsule (300 mg total) by mouth 2 (two) times daily.  Dispense: 180 capsule; Refill: 1     -Red flags and when to present for emergency care or RTC including fever >101.54F, chest pain, shortness of breath, new/worsening/un-resolving symptoms,  reviewed with patient at time of visit. Follow up and care instructions discussed and provided in AVS.  ------------------------------------------ I have reviewed this encounter including the documentation in this note and/or discussed this patient with the provider, Suezanne Cheshire DNP AGNP-C. I am certifying that I agree with the content of this note as supervising physician. Enid Derry, [facility] 07/06/2017, 1:23 PM

## 2017-07-03 LAB — COMPLETE METABOLIC PANEL WITH GFR
AG RATIO: 1.7 (calc) (ref 1.0–2.5)
ALBUMIN MSPROF: 3.8 g/dL (ref 3.6–5.1)
ALT: 13 U/L (ref 6–29)
AST: 16 U/L (ref 10–35)
Alkaline phosphatase (APISO): 151 U/L — ABNORMAL HIGH (ref 33–130)
BUN: 21 mg/dL (ref 7–25)
CO2: 29 mmol/L (ref 20–32)
Calcium: 9.5 mg/dL (ref 8.6–10.4)
Chloride: 104 mmol/L (ref 98–110)
Creat: 0.73 mg/dL (ref 0.60–0.93)
GFR, EST AFRICAN AMERICAN: 91 mL/min/{1.73_m2} (ref >=60)
GFR, EST NON AFRICAN AMERICAN: 78 mL/min/{1.73_m2} (ref >=60)
GLOBULIN: 2.3 g/dL (ref 1.9–3.7)
Glucose, Bld: 105 mg/dL (ref 65–139)
POTASSIUM: 4.1 mmol/L (ref 3.5–5.3)
SODIUM: 141 mmol/L (ref 135–146)
TOTAL PROTEIN: 6.1 g/dL (ref 6.1–8.1)
Total Bilirubin: 0.5 mg/dL (ref 0.2–1.2)

## 2017-07-03 LAB — LIPID PANEL
Cholesterol: 118 mg/dL (ref <200)
HDL: 45 mg/dL — AB (ref >50)
LDL CHOLESTEROL (CALC): 55 mg/dL
Non-HDL Cholesterol (Calc): 73 mg/dL (calc) (ref <130)
TRIGLYCERIDES: 95 mg/dL (ref <150)
Total CHOL/HDL Ratio: 2.6 (calc) (ref <5.0)

## 2017-07-03 LAB — VITAMIN D 25 HYDROXY (VIT D DEFICIENCY, FRACTURES): Vit D, 25-Hydroxy: 26 ng/mL — ABNORMAL LOW (ref 30–100)

## 2017-07-06 ENCOUNTER — Other Ambulatory Visit: Payer: Self-pay | Admitting: Nurse Practitioner

## 2017-07-06 DIAGNOSIS — R748 Abnormal levels of other serum enzymes: Secondary | ICD-10-CM

## 2017-07-07 DIAGNOSIS — H43813 Vitreous degeneration, bilateral: Secondary | ICD-10-CM | POA: Diagnosis not present

## 2017-07-07 NOTE — Progress Notes (Signed)
Spoke to [place] in lab. Test will be added.

## 2017-08-03 DIAGNOSIS — K219 Gastro-esophageal reflux disease without esophagitis: Secondary | ICD-10-CM | POA: Diagnosis not present

## 2017-08-03 DIAGNOSIS — I10 Essential (primary) hypertension: Secondary | ICD-10-CM | POA: Diagnosis not present

## 2017-08-03 DIAGNOSIS — R002 Palpitations: Secondary | ICD-10-CM | POA: Diagnosis not present

## 2017-08-03 DIAGNOSIS — E782 Mixed hyperlipidemia: Secondary | ICD-10-CM | POA: Diagnosis not present

## 2017-08-03 DIAGNOSIS — R0602 Shortness of breath: Secondary | ICD-10-CM | POA: Diagnosis not present

## 2017-09-01 ENCOUNTER — Other Ambulatory Visit: Payer: Self-pay | Admitting: Family Medicine

## 2017-09-01 DIAGNOSIS — I10 Essential (primary) hypertension: Secondary | ICD-10-CM

## 2017-09-02 DIAGNOSIS — Z96652 Presence of left artificial knee joint: Secondary | ICD-10-CM | POA: Diagnosis not present

## 2017-09-06 DIAGNOSIS — M19011 Primary osteoarthritis, right shoulder: Secondary | ICD-10-CM | POA: Diagnosis not present

## 2017-09-06 DIAGNOSIS — M7581 Other shoulder lesions, right shoulder: Secondary | ICD-10-CM | POA: Diagnosis not present

## 2017-09-09 ENCOUNTER — Telehealth: Payer: Self-pay

## 2017-09-09 NOTE — Telephone Encounter (Signed)
Called pt to sched AWV w/ [facility]. LVM requesting returned call.  

## 2017-09-13 NOTE — Telephone Encounter (Signed)
Spoke with patient and scheduled appt with [facility] for the end of August

## 2017-09-13 NOTE — Telephone Encounter (Signed)
Pt returned call to schedule AWV. P[facility] unable to do.

## 2017-10-04 ENCOUNTER — Other Ambulatory Visit: Payer: Self-pay | Admitting: Family Medicine

## 2017-10-04 ENCOUNTER — Encounter: Payer: Self-pay | Admitting: Family Medicine

## 2017-10-04 ENCOUNTER — Ambulatory Visit (INDEPENDENT_AMBULATORY_CARE_PROVIDER_SITE_OTHER): Payer: Medicare Other | Admitting: Family Medicine

## 2017-10-04 VITALS — BP 144/82 | HR 84 | Temp 98.2°F | Resp 16 | Ht 62.0 in | Wt 139.9 lb

## 2017-10-04 DIAGNOSIS — E559 Vitamin D deficiency, unspecified: Secondary | ICD-10-CM

## 2017-10-04 DIAGNOSIS — M542 Cervicalgia: Secondary | ICD-10-CM

## 2017-10-04 DIAGNOSIS — M7541 Impingement syndrome of right shoulder: Secondary | ICD-10-CM | POA: Insufficient documentation

## 2017-10-04 DIAGNOSIS — I10 Essential (primary) hypertension: Secondary | ICD-10-CM

## 2017-10-04 DIAGNOSIS — D320 Benign neoplasm of cerebral meninges: Secondary | ICD-10-CM

## 2017-10-04 DIAGNOSIS — E782 Mixed hyperlipidemia: Secondary | ICD-10-CM

## 2017-10-04 DIAGNOSIS — Z8673 Personal history of transient ischemic attack (TIA), and cerebral infarction without residual deficits: Secondary | ICD-10-CM | POA: Insufficient documentation

## 2017-10-04 DIAGNOSIS — M170 Bilateral primary osteoarthritis of knee: Secondary | ICD-10-CM | POA: Diagnosis not present

## 2017-10-04 DIAGNOSIS — K219 Gastro-esophageal reflux disease without esophagitis: Secondary | ICD-10-CM | POA: Diagnosis not present

## 2017-10-04 DIAGNOSIS — R748 Abnormal levels of other serum enzymes: Secondary | ICD-10-CM

## 2017-10-04 LAB — HEPATIC FUNCTION PANEL
AG RATIO: 1.5 (calc) (ref 1.0–2.5)
ALBUMIN MSPROF: 4 g/dL (ref 3.6–5.1)
ALT: 14 U/L (ref 6–29)
AST: 17 U/L (ref 10–35)
Alkaline phosphatase (APISO): 159 U/L — ABNORMAL HIGH (ref 33–130)
BILIRUBIN INDIRECT: 0.3 mg/dL (ref 0.2–1.2)
Bilirubin, Direct: 0.1 mg/dL (ref 0.0–0.2)
GLOBULIN: 2.6 g/dL (ref 1.9–3.7)
Total Bilirubin: 0.4 mg/dL (ref 0.2–1.2)
Total Protein: 6.6 g/dL (ref 6.1–8.1)

## 2017-10-04 MED ORDER — LOSARTAN POTASSIUM 50 MG PO TABS: 50.0000 mg | ORAL_TABLET | Freq: Every day | ORAL | 1 refills | Status: DC

## 2017-10-04 MED ORDER — ATORVASTATIN CALCIUM 40 MG PO TABS: 40.0000 mg | ORAL_TABLET | Freq: Every day | ORAL | 1 refills | Status: DC

## 2017-10-04 MED ORDER — AMLODIPINE BESYLATE 2.5 MG PO TABS: 2.5000 mg | ORAL_TABLET | Freq: Every day | ORAL | 1 refills | Status: DC

## 2017-10-04 MED ORDER — PANTOPRAZOLE SODIUM 40 MG PO TBEC: 40.0000 mg | DELAYED_RELEASE_TABLET | Freq: Every day | ORAL | 1 refills | Status: DC

## 2017-10-04 MED ORDER — GABAPENTIN 300 MG PO CAPS: 300.0000 mg | ORAL_CAPSULE | Freq: Two times a day (BID) | ORAL | 1 refills | Status: DC

## 2017-10-04 NOTE — Progress Notes (Signed)
Name: Catherine Burgess   MRN: [id number]    DOB: Jun 20, 1935   Date:10/04/2017       Progress Note  Subjective  Chief Complaint  Chief Complaint  Patient presents with  . Medication Refill  . Hypertension    Headaches  . Nasal Congestion    Onset-Sunday 1 week ago, sore throat, sinus drainage-has been taking NyQuil, Halls and Benadryl.   . Hyperlipidemia  . Osteoarthritis  . Gastroesophageal Reflux  . Constipation    HPI  Abnormal MRI brain from 2018 also abnormal CT brain from 06/2017. Last visit to [facility] for dizziness, and diagnosed with dehydration. CT showed calcified meningioma, discussed referral to neurologist but she would like to hold off for now, no symptoms. Denies headaches, dizziness or memory changes.   Nasal congestion: mild cold symptoms going on for one week, no fever or chills. Taking otc medication,  advised saline spray   GERD: doing well with pantoprazole, denies heart burn , regurgitation or blood in stools.   OA/ shoulder pain: under the care of Ortho, taking gabapentin and tylenol, still uses a quad cane to help with ambulation.   Hyperlipidemia: taking medication, denies side effects, labs reviewed. We will recheck hepatic panel since alkaline phosphatase was elevated  HTN: bp slightly elevated, taking otc medication, bp at home usually at goal, continue current dose of medication. Denies palpitation or chest pain   Patient Active Problem List   Diagnosis Date Noted  . Volvulus ([place])   . History of anemia 10/13/2016  . Syncope 07/23/2016  . Hypokalemia 07/23/2016  . Abnormal CT of brain 07/23/2016  . Vitamin D deficiency 10/28/2015  . Post menopausal syndrome 10/28/2015  . OP (osteoporosis) 10/28/2015  . Cervical disc disease 10/28/2015  . H/O total knee replacement 01/20/2015  . Vaginal atrophy 09/26/2014  . Osteoarthritis of both knees 07/31/2014  . GERD without esophagitis 07/31/2014  . HLD (hyperlipidemia) 07/31/2014  . Hypertension  07/31/2014    Past Surgical History:  Procedure Laterality Date  . CATARACT EXTRACTION, BILATERAL    . HIP PINNING Left 1956  . REPLACEMENT TOTAL KNEE Left 11/12/2009  . TOTAL KNEE REVISION Left 12/07/2016   Procedure: TOTAL KNEE REVISION;  Surgeon: Dereck Leep, MD;  Location: [facility] ORS;  Service: Orthopedics;  Laterality: Left;    Family History  Problem Relation Age of Onset  . Emphysema Father        Smoker  . CAD Mother   . Stroke Mother   . Diabetes Brother   . Seizures Brother   . Cancer Brother   . Diabetes Brother     Social History   Socioeconomic History  . Marital status: Single    Spouse name: Not on file  . Number of children: Not on file  . Years of education: Not on file  . Highest education level: Not on file  Occupational History  . Not on file  Social Needs  . Financial resource strain: Not on file  . Food insecurity:    Worry: Not on file    Inability: Not on file  . Transportation needs:    Medical: Not on file    Non-medical: Not on file  Tobacco Use  . Smoking status: Former Smoker    Last attempt to quit: 1966    Years since quitting: 53.6  . Smokeless tobacco: Never Used  Substance and Sexual Activity  . Alcohol use: No    Alcohol/week: 0.0 standard drinks  . Drug use: No  .  Sexual activity: Not Currently  Lifestyle  . Physical activity:    Days per week: Not on file    Minutes per session: Not on file  . Stress: Not on file  Relationships  . Social connections:    Talks on phone: Not on file    Gets together: Not on file    Attends religious service: Not on file    Active member of club or organization: Not on file    Attends meetings of clubs or organizations: Not on file    Relationship status: Not on file  . Intimate partner violence:    Fear of current or ex partner: Not on file    Emotionally abused: Not on file    Physically abused: Not on file    Forced sexual activity: Not on file  Other Topics Concern  . Not  on file  Social History Narrative  . Not on file     Current Outpatient Medications:  .  acetaminophen (TYLENOL) 500 MG tablet, Take 1 tablet (500 mg total) by mouth 2 (two) times daily. (Patient taking differently: Take 500 mg by mouth every 6 (six) hours as needed for mild pain. ), Disp: 180 tablet, Rfl: 0 .  amLODipine (NORVASC) 2.5 MG tablet, TAKE 1 TABLET BY MOUTH ONCE DAILY, Disp: 90 tablet, Rfl: 0 .  aspirin EC 81 MG tablet, Take 81 mg by mouth., Disp: , Rfl:  .  atorvastatin (LIPITOR) 40 MG tablet, Take 1 tablet (40 mg total) by mouth daily., Disp: 90 tablet, Rfl: 1 .  Calcium-Vitamin D 600-200 MG-UNIT tablet, Take 1 tablet by mouth daily. , Disp: , Rfl:  .  gabapentin (NEURONTIN) 300 MG capsule, Take 1 capsule (300 mg total) by mouth 2 (two) times daily., Disp: 180 capsule, Rfl: 1 .  losartan (COZAAR) 100 MG tablet, Take 0.5 tablets (50 mg total) by mouth daily., Disp: 90 tablet, Rfl: 0 .  pantoprazole (PROTONIX) 40 MG tablet, Take 1 tablet (40 mg total) by mouth daily., Disp: 90 tablet, Rfl: 1 .  polyethylene glycol (MIRALAX / GLYCOLAX) packet, Take 17 g by mouth daily., Disp: 100 each, Rfl: 0  Allergies  Allergen Reactions  . Lovastatin Other (See Comments)    chest  . Pravastatin     Other reaction(s): Muscle Pain chest  . Rosuvastatin     Other reaction(s): Muscle Pain chest Other reaction(s): Muscle Pain chest  . Statins     Other reaction(s): Muscle Pain chest     ROS  Constitutional: Negative for fever or weight change.  Respiratory: Negative for cough and shortness of breath.   Cardiovascular: Negative for chest pain or palpitations.  Gastrointestinal: Negative for abdominal pain, no bowel changes.  Musculoskeletal: Positive for gait problem and left knee joint swelling.  Skin: Negative for rash.  Neurological: Negative for dizziness or headache.  No other specific complaints in a complete review of systems (except as listed in HPI  above).  Objective  Vitals:   10/04/17 1048  BP: (!) 144/82  Pulse: 84  Resp: 16  Temp: 98.2 F (36.8 C)  TempSrc: Oral  SpO2: 98%  Weight: 139 lb 14.4 oz (63.5 kg)  Height: 5\' 2"  (1.575 m)    Body mass index is 25.59 kg/m.  Physical Exam  Constitutional: Patient appears well-developed and well-nourished. Overweight. No distress.  HEENT: head atraumatic, normocephalic, pupils equal and reactive to light, normal TM bilaterally , neck supple, throat within normal limits Cardiovascular: Normal rate, regular rhythm and normal heart  sounds.  No murmur heard. No BLE edema. Pulmonary/Chest: Effort normal and breath sounds normal. No respiratory distress. Muscular skeletal: decrease rom of left knee , uses a quad cane , decrease rom of right shoulder  Abdominal: Soft.  There is no tenderness. Psychiatric: Patient has a normal mood and affect. behavior is normal. Judgment and thought content normal.  PHQ2/9: Depression screen [facility] 2/9 10/04/2017 03/01/2017 10/05/2016 07/01/2016 03/05/2016  Decreased Interest 0 0 0 0 0  Down, Depressed, Hopeless 0 0 0 0 0  PHQ - 2 Score 0 0 0 0 0     Fall Risk: Fall Risk  10/04/2017 03/01/2017 01/08/2017 10/05/2016 07/01/2016  Falls in the past year? No No No No No  Number falls in past yr: - - - - -  Injury with Fall? - - - - -  Follow up - - - - -     Functional Status Survey: Is the patient deaf or have difficulty hearing?: No Does the patient have difficulty seeing, even when wearing glasses/contacts?: Yes Does the patient have difficulty concentrating, remembering, or making decisions?: No Does the patient have difficulty walking or climbing stairs?: Yes Does the patient have difficulty dressing or bathing?: No Does the patient have difficulty doing errands alone such as visiting a doctor's office or shopping?: No    Assessment & Plan   1. Calcified cerebral meningioma ([place])  CT was done 07/2016 after syncopal episode, never seen by  neurologist since discharge but not having symptoms, no longer working, bp is at goal. Discussed referral but she would like to hold off for now   2. Essential hypertension  BP at home usually 130-80's, we will continue current dose of medications and monitor. Because of her age 39's is fine - amLODipine (NORVASC) 2.5 MG tablet; Take 1 tablet (2.5 mg total) by mouth daily.  Dispense: 90 tablet; Refill: 1 - losartan (COZAAR) 50 MG tablet; Take 1 tablet (50 mg total) by mouth daily.  Dispense: 90 tablet; Refill: 1  3. GERD without esophagitis  - pantoprazole (PROTONIX) 40 MG tablet; Take 1 tablet (40 mg total) by mouth daily.  Dispense: 90 tablet; Refill: 1  4. Osteoarthritis of both knees, unspecified osteoarthritis type  - gabapentin (NEURONTIN) 300 MG capsule; Take 1 capsule (300 mg total) by mouth 2 (two) times daily.  Dispense: 180 capsule; Refill: 1  5. Neck pain  - gabapentin (NEURONTIN) 300 MG capsule; Take 1 capsule (300 mg total) by mouth 2 (two) times daily.  Dispense: 180 capsule; Refill: 1  6. Mixed hyperlipidemia  - atorvastatin (LIPITOR) 40 MG tablet; Take 1 tablet (40 mg total) by mouth daily.  Dispense: 90 tablet; Refill: 1  7. Elevated alkaline phosphatase level  - Hepatic function panel  8. Vitamin D deficiency  Continue vitamin D supplementation   9. Primary osteoarthritis of both knees   10. Subacromial impingement of right shoulder

## 2017-10-07 ENCOUNTER — Ambulatory Visit (INDEPENDENT_AMBULATORY_CARE_PROVIDER_SITE_OTHER): Payer: Medicare Other

## 2017-10-07 VITALS — BP 116/62 | HR 78 | Temp 98.3°F | Resp 12 | Ht 62.0 in | Wt 138.9 lb

## 2017-10-07 DIAGNOSIS — M8589 Other specified disorders of bone density and structure, multiple sites: Secondary | ICD-10-CM

## 2017-10-07 DIAGNOSIS — Z0000 Encounter for general adult medical examination without abnormal findings: Secondary | ICD-10-CM | POA: Diagnosis not present

## 2017-10-07 DIAGNOSIS — E2839 Other primary ovarian failure: Secondary | ICD-10-CM

## 2017-10-07 NOTE — Progress Notes (Addendum)
Subjective:   Catherine Burgess is a 82 y.o. female who presents for Medicare Annual (Subsequent) preventive examination.  Review of Systems:   N/A Cardiac Risk Factors include: advanced age (>65men, >36 women);dyslipidemia;hypertension;sedentary lifestyle     Objective:     Vitals: BP 116/62 (BP Location: Left Arm, Patient Position: Sitting, Cuff Size: Normal)   Pulse 78   Temp 98.3 F (36.8 C) (Oral)   Resp 12   Ht 5\' 2"  (1.575 m)   Wt 138 lb 14.4 oz (63 kg)   SpO2 98%   BMI 25.41 kg/m   Body mass index is 25.41 kg/m.  Advanced Directives 10/07/2017 01/24/2017 12/07/2016 12/07/2016 11/24/2016 10/05/2016 08/28/2016  Does Patient Have a Medical Advance Directive? Yes Yes No No No Yes Yes  Type of Paramedic of [place];Living will [place];Living will - - - Out of facility DNR (pink MOST or yellow form);[place];Living will [place];Living will  Does patient want to make changes to medical advance directive? - No - Patient declined - - - - -  Copy of [place] in Chart? No - copy requested No - copy requested - - - No - copy requested No - copy requested  Would patient like information on creating a medical advance directive? - - No - Patient declined No - Patient declined No - Patient declined - -    Tobacco Social History   Tobacco Use  Smoking Status Former Smoker  . Packs/day: 0.25  . Years: 1.00  . Pack years: 0.25  . Types: Cigarettes  . Last attempt to quit: 1966  . Years since quitting: 53.6  Smokeless Tobacco Never Used  Tobacco Comment   smoking cessation materials not required     Counseling given: No Comment: smoking cessation materials not required  Clinical Intake:  Pre-visit preparation completed: Yes  Pain : No/denies pain   BMI - recorded: 25.41 Nutritional Status: BMI 25 -29 Overweight Nutritional Risks: None Diabetes: No  How often do  you need to have someone help you when you read instructions, pamphlets, or other written materials from your doctor or pharmacy?: 1 - Never  Interpreter Needed?: No  Information entered by :: AEversole, LPN  Past Medical History:  Diagnosis Date  . Degenerative joint disease   . Hyperlipidemia   . Hypertension   . Malignant essential hypertension 07/23/2016  . Osteoarthrosis   . Reflux esophagitis    Past Surgical History:  Procedure Laterality Date  . CATARACT EXTRACTION, BILATERAL    . HIP PINNING Left 1956  . REPLACEMENT TOTAL KNEE Left 11/12/2009  . TOTAL KNEE REVISION Left 12/07/2016   Procedure: TOTAL KNEE REVISION;  Surgeon: Dereck Leep, MD;  Location: [facility] ORS;  Service: Orthopedics;  Laterality: Left;   Family History  Problem Relation Age of Onset  . Emphysema Father        Smoker  . CAD Mother   . Stroke Mother   . Diabetes Brother   . Seizures Brother   . Cancer Brother        unknown  . Diabetes Brother    Social History   Socioeconomic History  . Marital status: Divorced    Spouse name: Not on file  . Number of children: 1  . Years of education: Not on file  . Highest education level: 12th grade  Occupational History  . Occupation: Retired  Scientific laboratory technician  . Financial resource strain: Not hard  at all  . Food insecurity:    Worry: Never true    Inability: Never true  . Transportation needs:    Medical: No    Non-medical: No  Tobacco Use  . Smoking status: Former Smoker    Packs/day: 0.25    Years: 1.00    Pack years: 0.25    Types: Cigarettes    Last attempt to quit: 1966    Years since quitting: 53.6  . Smokeless tobacco: Never Used  . Tobacco comment: smoking cessation materials not required  Substance and Sexual Activity  . Alcohol use: No    Alcohol/week: 0.0 standard drinks  . Drug use: No  . Sexual activity: Not Currently  Lifestyle  . Physical activity:    Days per week: 0 days    Minutes per session: 0 min  . Stress: Not  at all  Relationships  . Social connections:    Talks on phone: Patient refused    Gets together: Patient refused    Attends religious service: Patient refused    Active member of club or organization: Patient refused    Attends meetings of clubs or organizations: Patient refused    Relationship status: Divorced  Other Topics Concern  . Not on file  Social History Narrative  . Not on file    Outpatient Encounter Medications as of 10/07/2017  Medication Sig  . acetaminophen (TYLENOL) 500 MG tablet Take 1 tablet (500 mg total) by mouth 2 (two) times daily. (Patient taking differently: Take 500 mg by mouth every 6 (six) hours as needed for mild pain. )  . amLODipine (NORVASC) 2.5 MG tablet Take 1 tablet (2.5 mg total) by mouth daily.  [name] aspirin EC 81 MG tablet Take 81 mg by mouth.  [name] atorvastatin (LIPITOR) 40 MG tablet Take 1 tablet (40 mg total) by mouth daily.  . Calcium-Vitamin D 600-200 MG-UNIT tablet Take 1 tablet by mouth daily.   [name] gabapentin (NEURONTIN) 300 MG capsule Take 1 capsule (300 mg total) by mouth 2 (two) times daily.  [name] losartan (COZAAR) 50 MG tablet Take 1 tablet (50 mg total) by mouth daily.  . pantoprazole (PROTONIX) 40 MG tablet TAKE 1 TABLET BY MOUTH EVERY DAY  . polyethylene glycol (MIRALAX / GLYCOLAX) packet Take 17 g by mouth daily.   No facility-administered encounter medications on file as of 10/07/2017.     Activities of Daily Living In your present state of health, do you have any difficulty performing the following activities: 10/07/2017 10/04/2017  Hearing? N N  Comment denies hearing aids -  Vision? N Y  Comment wears eyeglasses -  Difficulty concentrating or making decisions? N N  Walking or climbing stairs? N Y  Comment - -  Dressing or bathing? N N  Doing errands, shopping? N [place] and eating ? N -  Comment full set upper and lower dentures -  Using the Toilet? N -  In the past six months, have you accidently leaked  urine? Y -  Comment urgency -  Do you have problems with loss of bowel control? N -  Managing your Medications? N -  Managing your Finances? N -  Housekeeping or managing your Housekeeping? N -  Some recent data might be hidden    Patient Care Team: Steele Sizer, MD as PCP - General (Family Medicine) Leandrew Koyanagi, MD as Consulting Physician (Ophthalmology) Yolonda Kida, MD as Consulting Physician (Cardiology) Marry Guan, Laurice Record, MD as Consulting  Physician (Orthopedic Surgery) Rubie Maid, MD as Consulting Physician (Obstetrics and Gynecology) Lang Snow, NP as Nurse Practitioner (Neurology)    Assessment:   This is a routine wellness examination for Catherine Burgess.  Exercise Activities and Dietary recommendations Current Exercise Habits: The patient does not participate in regular exercise at present, Exercise limited by: None identified  Goals    . Increase water intake     Recommend increasing water intake to 4-5 glasses a day.    . Prevent falls     Recommend to remove any items from the home that may cause slips or trips.       Fall Risk Fall Risk  10/07/2017 10/04/2017 03/01/2017 01/08/2017 10/05/2016  Falls in the past year? No No No No No  Number falls in past yr: - - - - -  Injury with Fall? - - - - -  Risk for fall due to : Impaired vision - - - -  Risk for fall due to: Comment wears eyeglasses - - - -  Follow up - - - - -   [place]: Is your home free of loose throw rugs in walkways, pet beds, electrical cords, etc? Yes Is there adequate lighting in your home to reduce risk of falls?  Yes Are there stairs in or around your home WITH handrails? No stairs  ASSISTIVE DEVICES UTILIZED TO PREVENT FALLS: Use of a cane, walker or w/c? Yes,cane Grab bars in the bathroom? Yes  Shower chair or a place to sit while bathing? Yes An elevated toilet seat or a handicapped toilet? Yes  Timed Get Up and Go Performed: Yes.  Pt ambulated 10 feet within 25 sec. Gait slow, steady and without the use of an assistive device. No intervention required at this time. Fall risk prevention has been discussed.  Community Resource Referral:  [place] Referral not required at this time.   Depression Screen PHQ 2/9 Scores 10/07/2017 10/04/2017 03/01/2017 10/05/2016  PHQ - 2 Score 0 0 0 0  PHQ- 9 Score 0 - - -     Cognitive Function     6CIT Screen 10/07/2017  What Year? 0 points  What month? 3 points  What time? 0 points  Count back from 20 0 points  Months in reverse 0 points  Repeat phrase 8 points  Total Score 11    Immunization History  Administered Date(s) Administered  . Influenza, High Dose Seasonal PF 10/13/2016  . Influenza-Unspecified 10/31/2014, 12/05/2015  . Pneumococcal Conjugate-13 04/26/2013  . Pneumococcal Polysaccharide-23 10/13/2016    Qualifies for Shingles Vaccine? Yes. Due for Shingrix. Education has been provided regarding the importance of this vaccine. Pt has been advised to call insurance company to determine out of pocket expense. Advised may also receive vaccine at local pharmacy or Health Dept. Verbalized acceptance and understanding.  Screening Tests Health Maintenance  Topic Date Due  . INFLUENZA VACCINE  05/27/2018 (Originally 09/09/2017)  . TETANUS/TDAP  08/24/2022  . DEXA SCAN  Completed  . PNA vac Low Risk Adult  Completed    Cancer Screenings: Lung: Low Dose CT Chest recommended if Age 40-80 years, 30 pack-year currently smoking OR have quit w/in 15years. Patient does not qualify. Breast Screening: No longer required  Up to date of Bone Density/Dexa? No. Completed 09/07/12. Results reflect osteopenia. Repeat every 2 years. Ordered today. Pt provided with contact info and advised to schedule appt Colorectal: No longer required  Additional Screenings: Hepatitis C Screening: Does not qualify  Plan:  I have personally reviewed and addressed the Medicare Annual  Wellness questionnaire and have noted the following in the patient's chart:  A. Medical and social history B. Use of alcohol, tobacco or illicit drugs  C. Current medications and supplements D. Functional ability and status E.  Nutritional status F.  Physical activity G. Advance directives H. List of other physicians I.  Hospitalizations, surgeries, and ER visits in previous 12 months J.  [place] such as hearing and vision if needed, cognitive and depression L. Referrals and appointments  In addition, I have reviewed and discussed with patient certain preventive protocols, quality metrics, and best practice recommendations. A written personalized care plan for preventive services as well as general preventive health recommendations were provided to patient.  See attached scanned questionnaire for additional information.   Signed,  Aleatha Borer, LPN Nurse Health Advisor

## 2017-10-07 NOTE — Patient Instructions (Addendum)
Catherine Burgess , Thank you for taking time to come for your Medicare Wellness Visit. I appreciate your ongoing commitment to your health goals. Please review the following plan we discussed and let me know if I can assist you in the future.   Screening recommendations/referrals: Colorectal Screening: No longer required Mammogram: No longer required Bone Density: Please call to schedule your appointment  Vision and Dental Exams: Recommended annual ophthalmology exams for early detection of glaucoma and other disorders of the eye Recommended annual dental exams for proper oral hygiene  Vaccinations: Influenza vaccine: Declined Pneumococcal vaccine: Up to date Tdap vaccine: Up to date Shingles vaccine: Please call your insurance company to determine your out of pocket expense for the Shingrix vaccine. You may also receive this vaccine at your local pharmacy or Health Dept.    Advanced directives: Please bring a copy of your POA (Power of Attorney) and/or Living Will to your next appointment.  Goals: Recommend to remove any items from the home that may cause slips or trips.  Next appointment: Please schedule your Annual Wellness Visit with your Nurse Health Advisor in one year.  Preventive Care 78 Years and Older, Female Preventive care refers to lifestyle choices and visits with your health care provider that can promote health and wellness. What does preventive care include?  A yearly physical exam. This is also called an annual well check.  Dental exams once or twice a year.  Routine eye exams. Ask your health care provider how often you should have your eyes checked.  Personal lifestyle choices, including:  Daily care of your teeth and gums.  Regular physical activity.  Eating a healthy diet.  Avoiding tobacco and drug use.  Limiting alcohol use.  Practicing safe sex.  Taking low-dose aspirin every day.  Taking vitamin and mineral supplements as recommended by your  health care provider. What happens during an annual well check? The services and screenings done by your health care provider during your annual well check will depend on your age, overall health, lifestyle risk factors, and family history of disease. Counseling  Your health care provider may ask you questions about your:  Alcohol use.  Tobacco use.  Drug use.  Emotional well-being.  Home and relationship well-being.  Sexual activity.  Eating habits.  History of falls.  Memory and ability to understand (cognition).  Work and work Statistician.  Reproductive health. Screening  You may have the following tests or measurements:  Height, weight, and BMI.  Blood pressure.  Lipid and cholesterol levels. These may be checked every 5 years, or more frequently if you are over 45 years old.  Skin check.  Lung cancer screening. You may have this screening every year starting at age 37 if you have a 30-pack-year history of smoking and currently smoke or have quit within the past 15 years.  Fecal occult blood test (FOBT) of the stool. You may have this test every year starting at age 77.  Flexible sigmoidoscopy or colonoscopy. You may have a sigmoidoscopy every 5 years or a colonoscopy every 10 years starting at age 80.  Hepatitis C blood test.  Hepatitis B blood test.  Sexually transmitted disease (STD) testing.  Diabetes screening. This is done by checking your blood sugar (glucose) after you have not eaten for a while (fasting). You may have this done every 1-3 years.  Bone density scan. This is done to screen for osteoporosis. You may have this done starting at age 57.  Mammogram. This may be  done every 1-2 years. Talk to your health care provider about how often you should have regular mammograms. Talk with your health care provider about your test results, treatment options, and if necessary, the need for more tests. Vaccines  Your health care provider may recommend  certain vaccines, such as:  Influenza vaccine. This is recommended every year.  Tetanus, diphtheria, and acellular pertussis (Tdap, Td) vaccine. You may need a Td booster every 10 years.  Zoster vaccine. You may need this after age 44.  Pneumococcal 13-valent conjugate (PCV13) vaccine. One dose is recommended after age 55.  Pneumococcal polysaccharide (PPSV23) vaccine. One dose is recommended after age 68. Talk to your health care provider about which screenings and vaccines you need and how often you need them. This information is not intended to replace advice given to you by your health care provider. Make sure you discuss any questions you have with your health care provider. Document Released: 02/22/2015 Document Revised: 10/16/2015 Document Reviewed: 11/27/2014 Elsevier Interactive Patient Education  2017 [place] Prevention in the Home Falls can cause injuries. They can happen to people of all ages. There are many things you can do to make your home safe and to help prevent falls. What can I do on the outside of my home?  Regularly fix the edges of walkways and driveways and fix any cracks.  Remove anything that might make you trip as you walk through a door, such as a raised step or threshold.  Trim any bushes or trees on the path to your home.  Use bright outdoor lighting.  Clear any walking paths of anything that might make someone trip, such as rocks or tools.  Regularly check to see if handrails are loose or broken. Make sure that both sides of any steps have handrails.  Any raised decks and porches should have guardrails on the edges.  Have any leaves, snow, or ice cleared regularly.  Use sand or salt on walking paths during winter.  Clean up any spills in your garage right away. This includes oil or grease spills. What can I do in the bathroom?  Use night lights.  Install grab bars by the toilet and in the tub and shower. Do not use towel bars as  grab bars.  Use non-skid mats or decals in the tub or shower.  If you need to sit down in the shower, use a plastic, non-slip stool.  Keep the floor dry. Clean up any water that spills on the floor as soon as it happens.  Remove soap buildup in the tub or shower regularly.  Attach bath mats securely with double-sided non-slip rug tape.  Do not have throw rugs and other things on the floor that can make you trip. What can I do in the bedroom?  Use night lights.  Make sure that you have a light by your bed that is easy to reach.  Do not use any sheets or blankets that are too big for your bed. They should not hang down onto the floor.  Have a firm chair that has side arms. You can use this for support while you get dressed.  Do not have throw rugs and other things on the floor that can make you trip. What can I do in the kitchen?  Clean up any spills right away.  Avoid walking on wet floors.  Keep items that you use a lot in easy-to-reach places.  If you need to reach something above you, use  a strong step stool that has a grab bar.  Keep electrical cords out of the way.  Do not use floor polish or wax that makes floors slippery. If you must use wax, use non-skid floor wax.  Do not have throw rugs and other things on the floor that can make you trip. What can I do with my stairs?  Do not leave any items on the stairs.  Make sure that there are handrails on both sides of the stairs and use them. Fix handrails that are broken or loose. Make sure that handrails are as long as the stairways.  Check any carpeting to make sure that it is firmly attached to the stairs. Fix any carpet that is loose or worn.  Avoid having throw rugs at the top or bottom of the stairs. If you do have throw rugs, attach them to the floor with carpet tape.  Make sure that you have a light switch at the top of the stairs and the bottom of the stairs. If you do not have them, ask someone to add them  for you. What else can I do to help prevent falls?  Wear shoes that:  Do not have high heels.  Have rubber bottoms.  Are comfortable and fit you well.  Are closed at the toe. Do not wear sandals.  If you use a stepladder:  Make sure that it is fully opened. Do not climb a closed stepladder.  Make sure that both sides of the stepladder are locked into place.  Ask someone to hold it for you, if possible.  Clearly mark and make sure that you can see:  Any grab bars or handrails.  First and last steps.  Where the edge of each step is.  Use tools that help you move around (mobility aids) if they are needed. These include:  Canes.  Walkers.  Scooters.  Crutches.  Turn on the lights when you go into a dark area. Replace any light bulbs as soon as they burn out.  Set up your furniture so you have a clear path. Avoid moving your furniture around.  If any of your floors are uneven, fix them.  If there are any pets around you, be aware of where they are.  Review your medicines with your doctor. Some medicines can make you feel dizzy. This can increase your chance of falling. Ask your doctor what other things that you can do to help prevent falls. This information is not intended to replace advice given to you by your health care provider. Make sure you discuss any questions you have with your health care provider. Document Released: 11/22/2008 Document Revised: 07/04/2015 Document Reviewed: 03/02/2014 Elsevier Interactive Patient Education  2017 [employer].

## 2017-10-08 DIAGNOSIS — M7581 Other shoulder lesions, right shoulder: Secondary | ICD-10-CM | POA: Diagnosis not present

## 2017-10-08 DIAGNOSIS — M19011 Primary osteoarthritis, right shoulder: Secondary | ICD-10-CM | POA: Diagnosis not present

## 2017-10-24 ENCOUNTER — Emergency Department
Admission: EM | Admit: 2017-10-24 | Discharge: 2017-10-24 | Disposition: A | Discharge status: Home / Self Care | Payer: Medicare Other | Attending: Emergency Medicine | Admitting: Emergency Medicine

## 2017-10-24 ENCOUNTER — Other Ambulatory Visit: Payer: Self-pay

## 2017-10-24 ENCOUNTER — Encounter: Payer: Self-pay | Admitting: Emergency Medicine

## 2017-10-24 ENCOUNTER — Emergency Department: Payer: Medicare Other

## 2017-10-24 DIAGNOSIS — Z8673 Personal history of transient ischemic attack (TIA), and cerebral infarction without residual deficits: Secondary | ICD-10-CM | POA: Insufficient documentation

## 2017-10-24 DIAGNOSIS — M542 Cervicalgia: Secondary | ICD-10-CM | POA: Diagnosis not present

## 2017-10-24 DIAGNOSIS — Z87891 Personal history of nicotine dependence: Secondary | ICD-10-CM | POA: Insufficient documentation

## 2017-10-24 DIAGNOSIS — Z7982 Long term (current) use of aspirin: Secondary | ICD-10-CM | POA: Insufficient documentation

## 2017-10-24 DIAGNOSIS — Z79899 Other long term (current) drug therapy: Secondary | ICD-10-CM | POA: Insufficient documentation

## 2017-10-24 DIAGNOSIS — Z96652 Presence of left artificial knee joint: Secondary | ICD-10-CM | POA: Diagnosis not present

## 2017-10-24 DIAGNOSIS — I10 Essential (primary) hypertension: Secondary | ICD-10-CM | POA: Insufficient documentation

## 2017-10-24 DIAGNOSIS — R200 Anesthesia of skin: Secondary | ICD-10-CM | POA: Diagnosis present

## 2017-10-24 MED ORDER — PREDNISONE 20 MG PO TABS
60.0000 mg | ORAL_TABLET | Freq: Once | ORAL | Status: AC
  Administered 2017-10-24: 60 mg via ORAL
  Filled 2017-10-24: qty 3

## 2017-10-24 MED ORDER — PREDNISONE 50 MG PO TABS: ORAL_TABLET | ORAL | 0 refills | Status: DC

## 2017-10-24 MED ORDER — CYCLOBENZAPRINE HCL 5 MG PO TABS: 5.0000 mg | ORAL_TABLET | Freq: Three times a day (TID) | ORAL | 0 refills | Status: AC | PRN

## 2017-10-24 MED ORDER — CYCLOBENZAPRINE HCL 10 MG PO TABS
5.0000 mg | ORAL_TABLET | Freq: Once | ORAL | Status: AC
  Administered 2017-10-24: 5 mg via ORAL
  Filled 2017-10-24: qty 1

## 2017-10-24 NOTE — ED Triage Notes (Addendum)
Here for neck pain that started Friday.  Thinks may have slept wrong. Denies any chest pain, SHOB or radiation to left arm.  Has arthritis in neck per son and they think it has flared. Pain worse when moving.  Has been seeing ortho for right arm pain and pt thinks may have strained neck r/t pain in right arm.  VSS. NAD.  No cardiac hx.

## 2017-10-24 NOTE — ED Provider Notes (Signed)
[facility] Emergency Department Provider Note  ____________________________________________  Time seen: Approximately 4:53 PM  I have reviewed the triage vital signs and the nursing notes.   HISTORY  Chief Complaint Neck Pain    HPI Catherine Burgess is a 82 y.o. female presents to the emergency department with 8 out of 10 posterior neck pain for approximately 24 hours.  Patient states that she has had similar pain several years ago.  Patient reports that she intermittently has numbness and tingling in her second and fourth digits of her right hand but not her left hand.  She denies changes in strength.  No falls or other recent forms of trauma.  Patient has tried meloxicam and tenacity prescribed by her primary care provider, which have not relieved her symptoms.   Past Medical History:  Diagnosis Date  . Degenerative joint disease   . Hyperlipidemia   . Hypertension   . Malignant essential hypertension 07/23/2016  . Osteoarthrosis   . Reflux esophagitis     Patient Active Problem List   Diagnosis Date Noted  . Calcified cerebral meningioma ([place]) 10/04/2017  . History of CVA (cerebrovascular accident) without residual deficits 10/04/2017  . Subacromial impingement of right shoulder 10/04/2017  . Volvulus ([place])   . History of anemia 10/13/2016  . Syncope 07/23/2016  . Hypokalemia 07/23/2016  . Abnormal CT of brain 07/23/2016  . Vitamin D deficiency 10/28/2015  . Post menopausal syndrome 10/28/2015  . OP (osteoporosis) 10/28/2015  . Cervical disc disease 10/28/2015  . H/O total knee replacement 01/20/2015  . Vaginal atrophy 09/26/2014  . Osteoarthritis of both knees 07/31/2014  . GERD without esophagitis 07/31/2014  . HLD (hyperlipidemia) 07/31/2014  . Hypertension 07/31/2014    Past Surgical History:  Procedure Laterality Date  . CATARACT EXTRACTION, BILATERAL    . HIP PINNING Left 1956  . REPLACEMENT TOTAL KNEE Left 11/12/2009  . TOTAL  KNEE REVISION Left 12/07/2016   Procedure: TOTAL KNEE REVISION;  Surgeon: Dereck Leep, MD;  Location: [facility] ORS;  Service: Orthopedics;  Laterality: Left;    Prior to Admission medications   Medication Sig Start Date End Date Taking? Authorizing Provider  acetaminophen (TYLENOL) 500 MG tablet Take 1 tablet (500 mg total) by mouth 2 (two) times daily. Patient taking differently: Take 500 mg by mouth every 6 (six) hours as needed for mild pain.  10/28/15   Steele Sizer, MD  amLODipine (NORVASC) 2.5 MG tablet Take 1 tablet (2.5 mg total) by mouth daily. 10/04/17   Steele Sizer, MD  aspirin EC 81 MG tablet Take 81 mg by mouth.    [provider]  atorvastatin (LIPITOR) 40 MG tablet Take 1 tablet (40 mg total) by mouth daily. 10/04/17   Steele Sizer, MD  Calcium-Vitamin D 600-200 MG-UNIT tablet Take 1 tablet by mouth daily.     [provider]  cyclobenzaprine (FLEXERIL) 5 MG tablet Take 1 tablet (5 mg total) by mouth 3 (three) times daily as needed for up to 3 days for muscle spasms. 10/24/17 10/27/17  Lannie Fields, PA-C  gabapentin (NEURONTIN) 300 MG capsule Take 1 capsule (300 mg total) by mouth 2 (two) times daily. 10/04/17   Steele Sizer, MD  losartan (COZAAR) 50 MG tablet Take 1 tablet (50 mg total) by mouth daily. 10/04/17   Steele Sizer, MD  pantoprazole (PROTONIX) 40 MG tablet TAKE 1 TABLET BY MOUTH EVERY DAY 10/04/17   Steele Sizer, MD  polyethylene glycol (MIRALAX / GLYCOLAX) packet Take 17  g by mouth daily. 03/01/17   Steele Sizer, MD  predniSONE (DELTASONE) 50 MG tablet Take one 50 mg tablet once daily for the next four days. 10/24/17   Lannie Fields, PA-C    Allergies Lovastatin; Pravastatin; Rosuvastatin; and Statins  Family History  Problem Relation Age of Onset  . Emphysema Father        Smoker  . CAD Mother   . Stroke Mother   . Diabetes Brother   . Seizures Brother   . Cancer Brother        unknown  . Diabetes Brother     Social  History Social History   Tobacco Use  . Smoking status: Former Smoker    Packs/day: 0.25    Years: 1.00    Pack years: 0.25    Types: Cigarettes    Last attempt to quit: 1966    Years since quitting: 53.7  . Smokeless tobacco: Never Used  . Tobacco comment: smoking cessation materials not required  Substance Use Topics  . Alcohol use: No    Alcohol/week: 0.0 standard drinks  . Drug use: No     Review of Systems  Constitutional: No fever/chills Eyes: No visual changes. No discharge ENT: No upper respiratory complaints. Cardiovascular: no chest pain. Respiratory: no cough. No SOB. Gastrointestinal: No abdominal pain.  No nausea, no vomiting.  No diarrhea.  No constipation. Genitourinary: Negative for dysuria. No hematuria Musculoskeletal Patient has neck pain.  Skin: Negative for rash, abrasions, lacerations, ecchymosis. Neurological: Negative for headaches, focal weakness or numbness.   ____________________________________________   PHYSICAL EXAM:  VITAL SIGNS: ED Triage Vitals [10/24/17 1530]  Enc Vitals Group     BP (!) 158/73     Pulse Rate 100     Resp 16     Temp 99.2 F (37.3 C)     Temp Source Oral     SpO2 96 %     Weight 138 lb 14.2 oz (63 kg)     Height 5\' 2"  (1.575 m)     Head Circumference      Peak Flow      Pain Score 9     Pain Loc      Pain Edu?      Excl. in [place]?      Constitutional: Alert and oriented. Well appearing and in no acute distress. Eyes: Conjunctivae are normal. PERRL. EOMI. Head: Atraumatic. ENT:      Ears: TMs are pearly.      Nose: No congestion/rhinnorhea.      Mouth/Throat: Mucous membranes are moist.  Neck: No stridor.  Patient has difficulty performing lateral rotation of the neck.  Patient performs flexion and extension at the neck without difficulty.  No midline C-spine tenderness.  Patient has paraspinal muscle tenderness to palpation along the C-spine. Cardiovascular: Normal rate, regular rhythm. Normal S1 and  S2.  Good peripheral circulation. Respiratory: Normal respiratory effort without tachypnea or retractions. Lungs CTAB. Good air entry to the bases with no decreased or absent breath sounds. Musculoskeletal: Full range of motion to all extremities. No gross deformities appreciated. Neurologic:  Normal speech and language. No gross focal neurologic deficits are appreciated.  Skin:  Skin is warm, dry and intact. No rash noted. Psychiatric: Mood and affect are normal. Speech and behavior are normal. Patient exhibits appropriate insight and judgement.   ____________________________________________   LABS (all labs ordered are listed, but only abnormal results are displayed)  Labs Reviewed - No data to display ____________________________________________  EKG   ____________________________________________  RADIOLOGY I personally viewed and evaluated these images as part of my medical decision making, as well as reviewing the written report by the radiologist.  Dg Cervical Spine 2-3 Views  Result Date: 10/24/2017 CLINICAL DATA:  Neck pain 2-3 days. EXAM: CERVICAL SPINE - 2-3 VIEW COMPARISON:  None. FINDINGS: Vertebral body alignment and heights are normal. There is mild to moderate spondylosis throughout the cervical spine. Minimal disc space narrowing at the C4-5, C5-6 and C6-7 levels. Prevertebral soft tissues are normal. There is uncovertebral joint spurring and mild facet arthropathy. Atlantoaxial articulation is normal. IMPRESSION: Mild to moderate spondylosis of the cervical spinal with mild multilevel disc disease. Electronically Signed   By: Marin Olp M.D.   On: 10/24/2017 16:14    ____________________________________________    PROCEDURES  Procedure(s) performed:    Procedures    Medications  cyclobenzaprine (FLEXERIL) tablet 5 mg (5 mg Oral Given 10/24/17 1652)  predniSONE (DELTASONE) tablet 60 mg (60 mg Oral Given 10/24/17 1651)      ____________________________________________   INITIAL IMPRESSION / ASSESSMENT AND PLAN / ED COURSE  Pertinent labs & imaging results that were available during my care of the patient were reviewed by me and considered in my medical decision making (see chart for details).  Review of the [place] CSRS was performed in accordance of the [place] prior to dispensing any controlled drugs.      Assessment and Plan:  Neck pain:  Patient presents to the emergency department with neck pain for the past 24 hours.  X-ray examination of the cervical spine reveals some degenerative changes but no other acute abnormalities.  Patient was given Flexeril and prednisone in the emergency department.  Patient was observed over the course of 45 minutes.  Patient reports that her pain resolved.  Patient was discharged with a short course of prednisone and Flexeril.  She was advised to follow-up with primary care as needed.  All patient questions were answered.    ____________________________________________  FINAL CLINICAL IMPRESSION(S) / ED DIAGNOSES  Final diagnoses:  Neck pain      NEW MEDICATIONS STARTED DURING THIS VISIT:  ED Discharge Orders         Ordered    predniSONE (DELTASONE) 50 MG tablet     10/24/17 1755    cyclobenzaprine (FLEXERIL) 5 MG tablet  3 times daily PRN     10/24/17 1755              This chart was dictated using voice recognition software/Dragon. Despite best efforts to proofread, errors can occur which can change the meaning. Any change was purely unintentional.    Lannie Fields, PA-C 10/24/17 Azzie Almas, [place], MD 10/24/17 [phone]

## 2017-10-24 NOTE — ED Triage Notes (Signed)
First Nurse Note:  C/O neck pain.  Patient states she has had similar symptoms in the past and pain is related to muscle pain/ stiffness.

## 2017-10-25 ENCOUNTER — Other Ambulatory Visit: Payer: Self-pay

## 2017-10-29 ENCOUNTER — Ambulatory Visit (INDEPENDENT_AMBULATORY_CARE_PROVIDER_SITE_OTHER): Payer: Medicare Other | Admitting: Family Medicine

## 2017-10-29 ENCOUNTER — Encounter: Payer: Self-pay | Admitting: Family Medicine

## 2017-10-29 VITALS — BP 138/76 | HR 92 | Temp 98.4°F | Resp 14 | Ht 62.0 in | Wt 143.8 lb

## 2017-10-29 DIAGNOSIS — M542 Cervicalgia: Secondary | ICD-10-CM

## 2017-10-29 DIAGNOSIS — M25511 Pain in right shoulder: Secondary | ICD-10-CM | POA: Diagnosis not present

## 2017-10-29 MED ORDER — MELOXICAM 7.5 MG PO TABS: 7.5000 mg | ORAL_TABLET | Freq: Every day | ORAL | 0 refills | Status: DC

## 2017-10-29 MED ORDER — TIZANIDINE HCL 2 MG PO TABS: 2.0000 mg | ORAL_TABLET | Freq: Three times a day (TID) | ORAL | 0 refills | Status: DC | PRN

## 2017-10-29 NOTE — Progress Notes (Signed)
Name: Catherine Burgess   MRN: [id number]    DOB: 20-Nov-1935   Date:10/29/2017       Progress Note  Subjective  Chief Complaint  Chief Complaint  Patient presents with  . Neck Pain    neck pain seen in ER on Sunday    HPI  Pt presents to follow up on ER visit from 10/24/2013 where she was seen for neck pain.  Was given Prednisone 60 day taper and Flexeril.  C-spine Xray showed mild to moderal spondylosis with "mild multilevel disc disease" per the impression report.  Soft tissue normal, some vertebral joint spurring per findings report.    She notes today that she completed Prednisone and this did not seem to make a big difference.  She is out of flexeril, and felt like this helped a little bit but not much.  We discussed risk/benefit of NSAID use at age 82, she has not history of gastric ulcers, does have GERD that is well controlled with protonix, no blood in stool or dark and tarry stools.  We will do low dose meloxicam at 7.5mg .  Recent kidney function was May 2019 and was normal.  She has an appointment with Dr. Sallee Provencal ([place]) next Wednesday  - in 5 days, and requests help with her pain until then.  Patient Active Problem List   Diagnosis Date Noted  . Calcified cerebral meningioma ([place]) 10/04/2017  . History of CVA (cerebrovascular accident) without residual deficits 10/04/2017  . Subacromial impingement of right shoulder 10/04/2017  . Volvulus ([place])   . History of anemia 10/13/2016  . Syncope 07/23/2016  . Hypokalemia 07/23/2016  . Abnormal CT of brain 07/23/2016  . Vitamin D deficiency 10/28/2015  . Post menopausal syndrome 10/28/2015  . OP (osteoporosis) 10/28/2015  . Cervical disc disease 10/28/2015  . H/O total knee replacement 01/20/2015  . Vaginal atrophy 09/26/2014  . Osteoarthritis of both knees 07/31/2014  . GERD without esophagitis 07/31/2014  . HLD (hyperlipidemia) 07/31/2014  . Hypertension 07/31/2014    Past Surgical History:  Procedure Laterality  Date  . CATARACT EXTRACTION, BILATERAL    . HIP PINNING Left 1956  . REPLACEMENT TOTAL KNEE Left 11/12/2009  . TOTAL KNEE REVISION Left 12/07/2016   Procedure: TOTAL KNEE REVISION;  Surgeon: Dereck Leep, MD;  Location: [facility] ORS;  Service: Orthopedics;  Laterality: Left;    Family History  Problem Relation Age of Onset  . Emphysema Father        Smoker  . CAD Mother   . Stroke Mother   . Diabetes Brother   . Seizures Brother   . Cancer Brother        unknown  . Diabetes Brother     Social History   Socioeconomic History  . Marital status: Divorced    Spouse name: Not on file  . Number of children: 1  . Years of education: Not on file  . Highest education level: 12th grade  Occupational History  . Occupation: Retired  Scientific laboratory technician  . Financial resource strain: Not hard at all  . Food insecurity:    Worry: Never true    Inability: Never true  . Transportation needs:    Medical: No    Non-medical: No  Tobacco Use  . Smoking status: Former Smoker    Packs/day: 0.25    Years: 1.00    Pack years: 0.25    Types: Cigarettes    Last attempt to quit: 1966    Years since  quitting: 53.7  . Smokeless tobacco: Never Used  . Tobacco comment: smoking cessation materials not required  Substance and Sexual Activity  . Alcohol use: No    Alcohol/week: 0.0 standard drinks  . Drug use: No  . Sexual activity: Not Currently  Lifestyle  . Physical activity:    Days per week: 0 days    Minutes per session: 0 min  . Stress: Not at all  Relationships  . Social connections:    Talks on phone: Patient refused    Gets together: Patient refused    Attends religious service: Patient refused    Active member of club or organization: Patient refused    Attends meetings of clubs or organizations: Patient refused    Relationship status: Divorced  . Intimate partner violence:    Fear of current or ex partner: No    Emotionally abused: No    Physically abused: No    Forced sexual  activity: No  Other Topics Concern  . Not on file  Social History Narrative  . Not on file     Current Outpatient Medications:  .  acetaminophen (TYLENOL) 500 MG tablet, Take 1 tablet (500 mg total) by mouth 2 (two) times daily. (Patient taking differently: Take 500 mg by mouth every 6 (six) hours as needed for mild pain. ), Disp: 180 tablet, Rfl: 0 .  amLODipine (NORVASC) 2.5 MG tablet, Take 1 tablet (2.5 mg total) by mouth daily., Disp: 90 tablet, Rfl: 1 .  aspirin EC 81 MG tablet, Take 81 mg by mouth., Disp: , Rfl:  .  atorvastatin (LIPITOR) 40 MG tablet, Take 1 tablet (40 mg total) by mouth daily., Disp: 90 tablet, Rfl: 1 .  Calcium-Vitamin D 600-200 MG-UNIT tablet, Take 1 tablet by mouth daily. , Disp: , Rfl:  .  gabapentin (NEURONTIN) 300 MG capsule, Take 1 capsule (300 mg total) by mouth 2 (two) times daily., Disp: 180 capsule, Rfl: 1 .  losartan (COZAAR) 50 MG tablet, Take 1 tablet (50 mg total) by mouth daily., Disp: 90 tablet, Rfl: 1 .  pantoprazole (PROTONIX) 40 MG tablet, TAKE 1 TABLET BY MOUTH EVERY DAY, Disp: 90 tablet, Rfl: 0 .  polyethylene glycol (MIRALAX / GLYCOLAX) packet, Take 17 g by mouth daily., Disp: 100 each, Rfl: 0 .  predniSONE (DELTASONE) 50 MG tablet, Take one 50 mg tablet once daily for the next four days., Disp: 4 tablet, Rfl: 0  Allergies  Allergen Reactions  . Lovastatin Other (See Comments)    chest  . Pravastatin     Other reaction(s): Muscle Pain chest  . Rosuvastatin     Other reaction(s): Muscle Pain chest Other reaction(s): Muscle Pain chest  . Statins     Other reaction(s): Muscle Pain chest    I personally reviewed active problem list, medication list, allergies with the patient/caregiver today.   ROS Ten systems reviewed and is negative except as mentioned in HPI   Objective  Vitals:   10/29/17 1500  BP: 138/76  Pulse: 92  Resp: 14  Temp: 98.4 F (36.9 C)  TempSrc: Oral  SpO2: 96%  Weight: 143 lb 12.8 oz (65.2 kg)    Height: 5\' 2"  (1.575 m)    Body mass index is 26.3 kg/m.  Physical Exam Constitutional: Patient appears well-developed and well-nourished. No distress.  HENT: Head: Normocephalic and atraumatic. Ears: bilateral TMs with no erythema or effusion; Nose: Nose normal. Mouth/Throat: Oropharynx is clear and moist. No oropharyngeal exudate or tonsillar swelling.  Eyes: Conjunctivae  and EOM are normal. No scleral icterus.  Pupils are equal, round, and reactive to light.  Neck: Normal range of motion. Neck supple. No JVD present. No thyromegaly present.  Cardiovascular: Normal rate, regular rhythm and normal heart sounds.  No murmur heard. No BLE edema. Pulmonary/Chest: Effort normal and breath sounds normal. No respiratory distress. Abdominal: Soft. Bowel sounds are normal, no distension. There is no tenderness. No masses. Musculoskeletal: Normal range of motion, no joint effusions. No gross deformities.  Neck and BUE with normal AROM.  RIGHT trapezius is tense, but no palpable spasm, no shoulder, neck/spinal deformity. Neurological: Pt is alert and oriented to person, place, and time. No cranial nerve deficit. Coordination, balance, strength, speech and gait are normal.  Skin: Skin is warm and dry. No rash noted. No erythema.  Psychiatric: Patient has a normal mood and affect. behavior is normal. Judgment and thought content normal.  No results found for this or any previous visit (from the past 72 hour(s)).  PHQ2/9: Depression screen [facility] 2/9 10/29/2017 10/07/2017 10/04/2017 03/01/2017 10/05/2016  Decreased Interest 0 0 0 0 0  Down, Depressed, Hopeless 0 0 0 0 0  PHQ - 2 Score 0 0 0 0 0  Altered sleeping 0 0 - - -  Tired, decreased energy 0 0 - - -  Change in appetite 0 0 - - -  Feeling bad or failure about yourself  0 0 - - -  Trouble concentrating 0 0 - - -  Moving slowly or fidgety/restless 0 0 - - -  Suicidal thoughts 0 0 - - -  PHQ-9 Score 0 0 - - -  Difficult doing work/chores Not  difficult at all Not difficult at all - - -   Fall Risk: Fall Risk  10/29/2017 10/07/2017 10/04/2017 03/01/2017 01/08/2017  Falls in the past year? No No No No No  Number falls in past yr: - - - - -  Injury with Fall? - - - - -  Risk for fall due to : Impaired balance/gait;Impaired vision Impaired vision - - -  Risk for fall due to: Comment - wears eyeglasses - - -  Follow up - - - - -   Assessment & Plan  1. Neck pain - meloxicam (MOBIC) 7.5 MG tablet; Take 1 tablet (7.5 mg total) by mouth daily.  Dispense: 6 tablet; Refill: 0 - tiZANidine (ZANAFLEX) 2 MG tablet; Take 1 tablet (2 mg total) by mouth every 8 (eight) hours as needed for muscle spasms.  Dispense: 20 tablet; Refill: 0  2. Right shoulder pain, unspecified chronicity - tiZANidine (ZANAFLEX) 2 MG tablet; Take 1 tablet (2 mg total) by mouth every 8 (eight) hours as needed for muscle spasms.  Dispense: 20 tablet; Refill: 0

## 2017-11-03 DIAGNOSIS — M47812 Spondylosis without myelopathy or radiculopathy, cervical region: Secondary | ICD-10-CM | POA: Diagnosis not present

## 2017-11-11 DIAGNOSIS — M542 Cervicalgia: Secondary | ICD-10-CM | POA: Diagnosis not present

## 2017-11-11 DIAGNOSIS — M47812 Spondylosis without myelopathy or radiculopathy, cervical region: Secondary | ICD-10-CM | POA: Diagnosis not present

## 2017-11-19 DIAGNOSIS — M542 Cervicalgia: Secondary | ICD-10-CM | POA: Diagnosis not present

## 2017-11-23 DIAGNOSIS — M542 Cervicalgia: Secondary | ICD-10-CM | POA: Diagnosis not present

## 2017-12-01 DIAGNOSIS — M47812 Spondylosis without myelopathy or radiculopathy, cervical region: Secondary | ICD-10-CM | POA: Diagnosis not present

## 2017-12-01 DIAGNOSIS — M542 Cervicalgia: Secondary | ICD-10-CM | POA: Diagnosis not present

## 2017-12-29 ENCOUNTER — Ambulatory Visit: Payer: Medicare Other | Admitting: Family Medicine

## 2017-12-29 DIAGNOSIS — R208 Other disturbances of skin sensation: Secondary | ICD-10-CM | POA: Diagnosis not present

## 2018-01-04 ENCOUNTER — Other Ambulatory Visit: Payer: Self-pay | Admitting: Family Medicine

## 2018-01-04 DIAGNOSIS — I10 Essential (primary) hypertension: Secondary | ICD-10-CM

## 2018-01-04 MED ORDER — LOSARTAN POTASSIUM 50 MG PO TABS: 50.0000 mg | ORAL_TABLET | Freq: Every day | ORAL | 0 refills | Status: DC

## 2018-01-04 NOTE — Telephone Encounter (Signed)
Copied from [place] [id number]. Topic: General - Other >> Jan 04, 2018 10:16 AM Lennox Solders wrote: Reason for CRM: pt never pick up losartan 50 mg due to she had losartan 100 mg and was cutting pills in half. Pt needs losartan 50 mg #90 w/refills send to [facility] [address] [place] [address]. Pt is out of med

## 2018-01-09 DIAGNOSIS — J069 Acute upper respiratory infection, unspecified: Secondary | ICD-10-CM | POA: Diagnosis not present

## 2018-01-12 DIAGNOSIS — M7581 Other shoulder lesions, right shoulder: Secondary | ICD-10-CM | POA: Diagnosis not present

## 2018-01-12 DIAGNOSIS — M47812 Spondylosis without myelopathy or radiculopathy, cervical region: Secondary | ICD-10-CM | POA: Diagnosis not present

## 2018-01-17 DIAGNOSIS — D239 Other benign neoplasm of skin, unspecified: Secondary | ICD-10-CM | POA: Diagnosis not present

## 2018-01-17 DIAGNOSIS — G629 Polyneuropathy, unspecified: Secondary | ICD-10-CM | POA: Diagnosis not present

## 2018-01-17 DIAGNOSIS — L299 Pruritus, unspecified: Secondary | ICD-10-CM | POA: Diagnosis not present

## 2018-01-20 ENCOUNTER — Ambulatory Visit (INDEPENDENT_AMBULATORY_CARE_PROVIDER_SITE_OTHER): Payer: Medicare Other | Admitting: Family Medicine

## 2018-01-20 ENCOUNTER — Other Ambulatory Visit: Payer: Self-pay | Admitting: Family Medicine

## 2018-01-20 ENCOUNTER — Encounter: Payer: Self-pay | Admitting: Family Medicine

## 2018-01-20 VITALS — BP 180/70 | HR 90 | Temp 98.5°F | Resp 16 | Ht 62.0 in | Wt 148.6 lb

## 2018-01-20 DIAGNOSIS — E782 Mixed hyperlipidemia: Secondary | ICD-10-CM | POA: Diagnosis not present

## 2018-01-20 DIAGNOSIS — Z23 Encounter for immunization: Secondary | ICD-10-CM | POA: Diagnosis not present

## 2018-01-20 DIAGNOSIS — M170 Bilateral primary osteoarthritis of knee: Secondary | ICD-10-CM | POA: Diagnosis not present

## 2018-01-20 DIAGNOSIS — R202 Paresthesia of skin: Secondary | ICD-10-CM

## 2018-01-20 DIAGNOSIS — D320 Benign neoplasm of cerebral meninges: Secondary | ICD-10-CM | POA: Diagnosis not present

## 2018-01-20 DIAGNOSIS — M542 Cervicalgia: Secondary | ICD-10-CM

## 2018-01-20 DIAGNOSIS — I10 Essential (primary) hypertension: Secondary | ICD-10-CM | POA: Diagnosis not present

## 2018-01-20 DIAGNOSIS — K219 Gastro-esophageal reflux disease without esophagitis: Secondary | ICD-10-CM

## 2018-01-20 MED ORDER — PANTOPRAZOLE SODIUM 40 MG PO TBEC: 40.0000 mg | DELAYED_RELEASE_TABLET | Freq: Every day | ORAL | 1 refills | Status: DC

## 2018-01-20 MED ORDER — LOSARTAN POTASSIUM 50 MG PO TABS: 50.0000 mg | ORAL_TABLET | Freq: Every day | ORAL | 1 refills | Status: DC

## 2018-01-20 MED ORDER — AMLODIPINE BESYLATE 2.5 MG PO TABS: 2.5000 mg | ORAL_TABLET | Freq: Every day | ORAL | 1 refills | Status: DC

## 2018-01-20 MED ORDER — ATORVASTATIN CALCIUM 40 MG PO TABS: 40.0000 mg | ORAL_TABLET | Freq: Every day | ORAL | 1 refills | Status: DC

## 2018-01-20 MED ORDER — GABAPENTIN 300 MG PO CAPS: 300.0000 mg | ORAL_CAPSULE | Freq: Two times a day (BID) | ORAL | 1 refills | Status: DC

## 2018-01-20 NOTE — Progress Notes (Signed)
Name: Catherine Burgess   MRN: [id number]    DOB: 1935-10-11   Date:01/20/2018       Progress Note  Subjective  Chief Complaint  Chief Complaint  Patient presents with  . Neck Pain    Follow up on neck pain. She has been evaluated by ortho and PT. She was given some exercises to do and her neck feels much better. Pain is gradually resolving.    HPI  Abnormal MRI brain from 2018 also abnormal CT brain from 06/2017. Last visit to [facility] for dizziness, and diagnosed with dehydration. CT showed calcified meningioma, discussed referral to neurologist but she would like to hold off for now, no symptoms. Denies headaches, dizziness or memory changes. She has noticed paresthesia on both hands, she states it has been a couple of months, no weakness associated, states seen by Dermatologist and improved with cream - she went for a rash on both hands, diagnosed with contact dermatitis  Nasal congestion: using flonase prn and doing better at this time, no rhinorrhea  GERD: doing well with pantoprazole, denies heart burn , regurgitation or blood in stools.. Denies change in bowel movements or appetite    OA/ shoulder pain:/neck pain under the care of Ortho, taking gabapentin and tylenol, still uses a quad cane to help with ambulation. She states still having PT and is doing better, pain level is down to zero right now  Hyperlipidemia: taking medication, denies side effects, labs reviewed, alkaline phosphatase was stable  HTN: bp elevated today, she has been out of losartan for over one week, she states when she takes all her medications bp at home in the 130's range, she states when on higher dose of losartan bp was low and she was getting dizzy.    Patient Active Problem List   Diagnosis Date Noted  . Calcified cerebral meningioma ([place]) 10/04/2017  . History of CVA (cerebrovascular accident) without residual deficits 10/04/2017  . Subacromial impingement of right shoulder 10/04/2017  . Volvulus  ([place])   . History of anemia 10/13/2016  . Syncope 07/23/2016  . Hypokalemia 07/23/2016  . Abnormal CT of brain 07/23/2016  . Vitamin D deficiency 10/28/2015  . Post menopausal syndrome 10/28/2015  . OP (osteoporosis) 10/28/2015  . Cervical disc disease 10/28/2015  . H/O total knee replacement 01/20/2015  . Vaginal atrophy 09/26/2014  . Osteoarthritis of both knees 07/31/2014  . GERD without esophagitis 07/31/2014  . HLD (hyperlipidemia) 07/31/2014  . Hypertension 07/31/2014    Past Surgical History:  Procedure Laterality Date  . CATARACT EXTRACTION, BILATERAL    . HIP PINNING Left 1956  . REPLACEMENT TOTAL KNEE Left 11/12/2009  . TOTAL KNEE REVISION Left 12/07/2016   Procedure: TOTAL KNEE REVISION;  Surgeon: Dereck Leep, MD;  Location: [facility] ORS;  Service: Orthopedics;  Laterality: Left;    Family History  Problem Relation Age of Onset  . Emphysema Father        Smoker  . CAD Mother   . Stroke Mother   . Diabetes Brother   . Seizures Brother   . Cancer Brother        unknown  . Diabetes Brother     Social History   Socioeconomic History  . Marital status: Divorced    Spouse name: Not on file  . Number of children: 1  . Years of education: Not on file  . Highest education level: 12th grade  Occupational History  . Occupation: Retired  Scientific laboratory technician  . Financial resource strain:  Not hard at all  . Food insecurity:    Worry: Never true    Inability: Never true  . Transportation needs:    Medical: No    Non-medical: No  Tobacco Use  . Smoking status: Former Smoker    Packs/day: 0.25    Years: 1.00    Pack years: 0.25    Types: Cigarettes    Last attempt to quit: 1966    Years since quitting: 53.9  . Smokeless tobacco: Never Used  . Tobacco comment: smoking cessation materials not required  Substance and Sexual Activity  . Alcohol use: No    Alcohol/week: 0.0 standard drinks  . Drug use: No  . Sexual activity: Not Currently  Lifestyle  . Physical  activity:    Days per week: 0 days    Minutes per session: 0 min  . Stress: Not at all  Relationships  . Social connections:    Talks on phone: Patient refused    Gets together: Patient refused    Attends religious service: Patient refused    Active member of club or organization: Patient refused    Attends meetings of clubs or organizations: Patient refused    Relationship status: Divorced  . Intimate partner violence:    Fear of current or ex partner: No    Emotionally abused: No    Physically abused: No    Forced sexual activity: No  Other Topics Concern  . Not on file  Social History Narrative  . Not on file     Current Outpatient Medications:  .  acetaminophen (TYLENOL) 500 MG tablet, Take 1 tablet (500 mg total) by mouth 2 (two) times daily. (Patient taking differently: Take 500 mg by mouth every 6 (six) hours as needed for mild pain. ), Disp: 180 tablet, Rfl: 0 .  amLODipine (NORVASC) 2.5 MG tablet, Take 1 tablet (2.5 mg total) by mouth daily., Disp: 90 tablet, Rfl: 1 .  aspirin EC 81 MG tablet, Take 81 mg by mouth., Disp: , Rfl:  .  atorvastatin (LIPITOR) 40 MG tablet, Take 1 tablet (40 mg total) by mouth daily., Disp: 90 tablet, Rfl: 1 .  Calcium-Vitamin D 600-200 MG-UNIT tablet, Take 1 tablet by mouth daily. , Disp: , Rfl:  .  fluticasone (FLONASE) 50 MCG/ACT nasal spray, Place into the nose., Disp: , Rfl:  .  gabapentin (NEURONTIN) 300 MG capsule, Take 1 capsule (300 mg total) by mouth 2 (two) times daily., Disp: 180 capsule, Rfl: 1 .  losartan (COZAAR) 50 MG tablet, Take 1 tablet (50 mg total) by mouth daily., Disp: 90 tablet, Rfl: 1 .  polyethylene glycol (MIRALAX / GLYCOLAX) packet, Take 17 g by mouth daily., Disp: 100 each, Rfl: 0 .  pantoprazole (PROTONIX) 40 MG tablet, Take 1 tablet (40 mg total) by mouth daily., Disp: 90 tablet, Rfl: 1 .  triamcinolone cream (KENALOG) 0.1 %, APP EXT AA TWICE DAILY. DO NOT U LONGER THAN 2 CONS WKS, Disp: , Rfl: 0  Allergies   Allergen Reactions  . Lovastatin Other (See Comments)    chest  . Pravastatin     Other reaction(s): Muscle Pain chest  . Rosuvastatin     Other reaction(s): Muscle Pain chest Other reaction(s): Muscle Pain chest  . Statins     Other reaction(s): Muscle Pain chest    I personally reviewed active problem list, medication list, allergies, family history, social history with the patient/caregiver today.   ROS  Constitutional: Negative for fever or weight change.  Respiratory: Negative for cough and shortness of breath.   Cardiovascular: Negative for chest pain or palpitations.  Gastrointestinal: Negative for abdominal pain, no bowel changes.  Musculoskeletal: Negative for gait problem or joint swelling.  Skin: Negative for rash.  Neurological: Negative for dizziness or headache.  No other specific complaints in a complete review of systems (except as listed in HPI above).  Objective  Vitals:   01/20/18 0951 01/20/18 1009  BP: (!) 160/80 (!) 180/70  Pulse: 90   Resp: 16   Temp: 98.5 F (36.9 C)   TempSrc: Oral   SpO2: 98%   Weight: 148 lb 9.6 oz (67.4 kg)   Height: 5\' 2"  (1.575 m)     Body mass index is 27.18 kg/m.  Physical Exam  Constitutional: Patient appears well-developed and well-nourished.No distress.  HEENT: head atraumatic, normocephalic, pupils equal and reactive to light,  neck supple, throat within normal limits Cardiovascular: Normal rate, regular rhythm and normal heart sounds.  No murmur heard. No BLE edema. Pulmonary/Chest: Effort normal and breath sounds normal. No respiratory distress. Neurological: normal grip, normal sensation on both hands.  Abdominal: Soft.  There is no tenderness. Psychiatric: Patient has a normal mood and affect. behavior is normal. Judgment and thought content normal.  PHQ2/9: Depression screen [facility] 2/9 10/29/2017 10/07/2017 10/04/2017 03/01/2017 10/05/2016  Decreased Interest 0 0 0 0 0  Down, Depressed, Hopeless 0 0 0 0 0   PHQ - 2 Score 0 0 0 0 0  Altered sleeping 0 0 - - -  Tired, decreased energy 0 0 - - -  Change in appetite 0 0 - - -  Feeling bad or failure about yourself  0 0 - - -  Trouble concentrating 0 0 - - -  Moving slowly or fidgety/restless 0 0 - - -  Suicidal thoughts 0 0 - - -  PHQ-9 Score 0 0 - - -  Difficult doing work/chores Not difficult at all Not difficult at all - - -    Fall Risk: Fall Risk  01/20/2018 10/29/2017 10/07/2017 10/04/2017 03/01/2017  Falls in the past year? 0 No No No No  Number falls in past yr: - - - - -  Injury with Fall? - - - - -  Risk for fall due to : - Impaired balance/gait;Impaired vision Impaired vision - -  Risk for fall due to: Comment - - wears eyeglasses - -  Follow up - - - - -    Functional Status Survey: Is the patient deaf or have difficulty hearing?: No Does the patient have difficulty seeing, even when wearing glasses/contacts?: No Does the patient have difficulty concentrating, remembering, or making decisions?: No Does the patient have difficulty walking or climbing stairs?: Yes Does the patient have difficulty dressing or bathing?: No Does the patient have difficulty doing errands alone such as visiting a doctor's office or shopping?: No    Assessment & Plan  1. Calcified cerebral meningioma [facility])  She does not want to have referral to neurologist   2. Flu vaccine need  - Flu vaccine HIGH DOSE PF  3. Essential hypertension  - amLODipine (NORVASC) 2.5 MG tablet; Take 1 tablet (2.5 mg total) by mouth daily.  Dispense: 90 tablet; Refill: 1 - losartan (COZAAR) 50 MG tablet; Take 1 tablet (50 mg total) by mouth daily.  Dispense: 90 tablet; Refill: 1  4. Mixed hyperlipidemia  - atorvastatin (LIPITOR) 40 MG tablet; Take 1 tablet (40 mg total) by mouth daily.  Dispense: 90  tablet; Refill: 1  5. Osteoarthritis of both knees, unspecified osteoarthritis type  - gabapentin (NEURONTIN) 300 MG capsule; Take 1 capsule (300 mg total) by mouth  2 (two) times daily.  Dispense: 180 capsule; Refill: 1  6. Neck pain  - gabapentin (NEURONTIN) 300 MG capsule; Take 1 capsule (300 mg total) by mouth 2 (two) times daily.  Dispense: 180 capsule; Refill: 1 Doing well now. Finishing PT   7. GERD without esophagitis  - pantoprazole (PROTONIX) 40 MG tablet; Take 1 tablet (40 mg total) by mouth daily.  Dispense: 90 tablet; Refill: 1  8. Paresthesia of both hands  - B12 and Folate Panel

## 2018-01-21 LAB — B12 AND FOLATE PANEL
FOLATE: 15.9 ng/mL
VITAMIN B 12: 446 pg/mL (ref 200–1100)

## 2018-01-24 NOTE — Addendum Note (Signed)
Addended by: Inda Coke on: 01/24/2018 05:16 PM   Modules accepted: Orders

## 2018-01-24 NOTE — Addendum Note (Signed)
Addended by: Steele Sizer F on: 01/24/2018 05:33 PM   Modules accepted: Orders

## 2018-02-14 ENCOUNTER — Ambulatory Visit: Payer: Medicare Other | Admitting: Family Medicine

## 2018-02-24 DIAGNOSIS — E559 Vitamin D deficiency, unspecified: Secondary | ICD-10-CM | POA: Diagnosis not present

## 2018-02-24 DIAGNOSIS — R200 Anesthesia of skin: Secondary | ICD-10-CM | POA: Diagnosis not present

## 2018-02-24 DIAGNOSIS — R252 Cramp and spasm: Secondary | ICD-10-CM | POA: Diagnosis not present

## 2018-03-02 ENCOUNTER — Other Ambulatory Visit: Payer: Self-pay | Admitting: Family Medicine

## 2018-03-02 DIAGNOSIS — R6889 Other general symptoms and signs: Secondary | ICD-10-CM | POA: Insufficient documentation

## 2018-03-03 DIAGNOSIS — T84038D Mechanical loosening of other internal prosthetic joint, subsequent encounter: Secondary | ICD-10-CM | POA: Diagnosis not present

## 2018-03-03 DIAGNOSIS — Z96652 Presence of left artificial knee joint: Secondary | ICD-10-CM | POA: Diagnosis not present

## 2018-03-14 DIAGNOSIS — M79604 Pain in right leg: Secondary | ICD-10-CM | POA: Diagnosis not present

## 2018-03-14 DIAGNOSIS — R252 Cramp and spasm: Secondary | ICD-10-CM | POA: Diagnosis not present

## 2018-03-14 DIAGNOSIS — M79605 Pain in left leg: Secondary | ICD-10-CM | POA: Diagnosis not present

## 2018-03-29 ENCOUNTER — Other Ambulatory Visit: Payer: Self-pay | Admitting: Family Medicine

## 2018-03-29 DIAGNOSIS — I10 Essential (primary) hypertension: Secondary | ICD-10-CM

## 2018-05-13 ENCOUNTER — Telehealth: Payer: Self-pay | Admitting: Family Medicine

## 2018-05-13 NOTE — Telephone Encounter (Signed)
Pt called and wants a muscle relaxer caller called in to her pharmacy , she said that she has taken mobic in the past. Please advise.

## 2018-05-16 NOTE — Telephone Encounter (Signed)
appt for 05-16-2018 by phone only

## 2018-05-17 ENCOUNTER — Encounter: Payer: Self-pay | Admitting: Family Medicine

## 2018-05-17 ENCOUNTER — Other Ambulatory Visit: Payer: Self-pay

## 2018-05-17 ENCOUNTER — Ambulatory Visit (INDEPENDENT_AMBULATORY_CARE_PROVIDER_SITE_OTHER): Payer: Medicare Other | Admitting: Family Medicine

## 2018-05-17 VITALS — BP 133/73 | HR 83

## 2018-05-17 DIAGNOSIS — M542 Cervicalgia: Secondary | ICD-10-CM | POA: Diagnosis not present

## 2018-05-17 MED ORDER — BACLOFEN 10 MG PO TABS: 10.0000 mg | ORAL_TABLET | Freq: Three times a day (TID) | ORAL | 0 refills | Status: DC

## 2018-05-17 MED ORDER — MELOXICAM 15 MG PO TABS: 15.0000 mg | ORAL_TABLET | Freq: Every day | ORAL | 0 refills | Status: DC

## 2018-05-17 NOTE — Progress Notes (Signed)
Name: Catherine Burgess   MRN: [id number]    DOB: 1935-06-26   Date:05/17/2018       Progress Note  Subjective  Chief Complaint  Chief Complaint  Patient presents with  . Neck Pain    neck pain is better than it was before. She sees Dr. Lonia Farber for her shoulder and neck pain.  [name] Headache    more of soreness  . Medication Refill    She reports that she is suppose to be taking Gabapentin 3 times daily. Once during the day and 2 tablets at night. She will not have enough medication to last her.    I connected with@ on 05/17/18 at 10:40 AM EDT by a video enabled telemedicine application and verified that I am speaking with the correct person using two identifiers.  I discussed the limitations of evaluation and management by telemedicine and the availability of in person appointments. The patient expressed understanding and agreed to proceed. Staff also discussed with the patient that there may be a patient responsible charge related to this service. Patient Location: home  Provider Location: [facility] Additional Individuals present: using grand-daughter's phone   HPI  Neck pain: she states she noticed pain 6 days ago. She states she noticed some pain on nuchal area 6 days ago, got progressively worse and radiated to the back of her neck. She states she could not move her neck for a few days, she states she found some meloxicam, using hot towels alternating with cold compresses  and also topical medication otc  and it helped, pain is down today to 5-6/10 She denies tingling on numbness , no weakness. She would like to have a refill of meloxicam and also a muscle relaxer    Patient Active Problem List   Diagnosis Date Noted  . Abnormal ankle brachial index (ABI) 03/02/2018  . Calcified cerebral meningioma ([place]) 10/04/2017  . History of CVA (cerebrovascular accident) without residual deficits 10/04/2017  . Subacromial impingement of right shoulder 10/04/2017  .  Volvulus ([place])   . History of anemia 10/13/2016  . Syncope 07/23/2016  . Hypokalemia 07/23/2016  . Abnormal CT of brain 07/23/2016  . Vitamin D deficiency 10/28/2015  . Post menopausal syndrome 10/28/2015  . OP (osteoporosis) 10/28/2015  . Cervical disc disease 10/28/2015  . H/O total knee replacement 01/20/2015  . Vaginal atrophy 09/26/2014  . Osteoarthritis of both knees 07/31/2014  . GERD without esophagitis 07/31/2014  . HLD (hyperlipidemia) 07/31/2014  . Hypertension 07/31/2014    Past Surgical History:  Procedure Laterality Date  . CATARACT EXTRACTION, BILATERAL    . HIP PINNING Left 1956  . REPLACEMENT TOTAL KNEE Left 11/12/2009  . TOTAL KNEE REVISION Left 12/07/2016   Procedure: TOTAL KNEE REVISION;  Surgeon: Dereck Leep, MD;  Location: [facility] ORS;  Service: Orthopedics;  Laterality: Left;    Family History  Problem Relation Age of Onset  . Emphysema Father        Smoker  . CAD Mother   . Stroke Mother   . Diabetes Brother   . Seizures Brother   . Cancer Brother        unknown  . Diabetes Brother     Social History   Socioeconomic History  . Marital status: Divorced    Spouse name: Not on file  . Number of children: 1  . Years of education: Not on file  . Highest education level: 12th grade  Occupational History  . Occupation: Retired  Social Needs  . Financial resource strain: Not hard at all  . Food insecurity:    Worry: Never true    Inability: Never true  . Transportation needs:    Medical: No    Non-medical: No  Tobacco Use  . Smoking status: Former Smoker    Packs/day: 0.25    Years: 1.00    Pack years: 0.25    Types: Cigarettes    Last attempt to quit: 1966    Years since quitting: 54.3  . Smokeless tobacco: Never Used  . Tobacco comment: smoking cessation materials not required  Substance and Sexual Activity  . Alcohol use: No    Alcohol/week: 0.0 standard drinks  . Drug use: No  . Sexual activity: Not Currently  Lifestyle  .  Physical activity:    Days per week: 0 days    Minutes per session: 0 min  . Stress: Not at all  Relationships  . Social connections:    Talks on phone: Patient refused    Gets together: Patient refused    Attends religious service: Patient refused    Active member of club or organization: Patient refused    Attends meetings of clubs or organizations: Patient refused    Relationship status: Divorced  . Intimate partner violence:    Fear of current or ex partner: No    Emotionally abused: No    Physically abused: No    Forced sexual activity: No  Other Topics Concern  . Not on file  Social History Narrative  . Not on file     Current Outpatient Medications:  .  acetaminophen (TYLENOL) 500 MG tablet, Take 1 tablet (500 mg total) by mouth 2 (two) times daily. (Patient taking differently: Take 500 mg by mouth every 6 (six) hours as needed for mild pain. ), Disp: 180 tablet, Rfl: 0 .  amLODipine (NORVASC) 2.5 MG tablet, Take 1 tablet (2.5 mg total) by mouth daily., Disp: 90 tablet, Rfl: 1 .  aspirin EC 81 MG tablet, Take 81 mg by mouth., Disp: , Rfl:  .  atorvastatin (LIPITOR) 40 MG tablet, Take 1 tablet (40 mg total) by mouth daily., Disp: 90 tablet, Rfl: 1 .  Calcium-Vitamin D 600-200 MG-UNIT tablet, Take 1 tablet by mouth daily. , Disp: , Rfl:  .  fluticasone (FLONASE) 50 MCG/ACT nasal spray, Place into the nose., Disp: , Rfl:  .  gabapentin (NEURONTIN) 300 MG capsule, Take 1 capsule (300 mg total) by mouth 2 (two) times daily., Disp: 180 capsule, Rfl: 1 .  losartan (COZAAR) 50 MG tablet, Take 1 tablet (50 mg total) by mouth daily., Disp: 90 tablet, Rfl: 1 .  pantoprazole (PROTONIX) 40 MG tablet, Take 1 tablet (40 mg total) by mouth daily., Disp: 90 tablet, Rfl: 1 .  polyethylene glycol (MIRALAX / GLYCOLAX) packet, Take 17 g by mouth daily., Disp: 100 each, Rfl: 0 .  triamcinolone cream (KENALOG) 0.1 %, APP EXT AA TWICE DAILY. DO NOT U LONGER THAN 2 CONS WKS, Disp: , Rfl: 0   Allergies  Allergen Reactions  . Lovastatin Other (See Comments)    chest  . Pravastatin     Other reaction(s): Muscle Pain chest  . Rosuvastatin     Other reaction(s): Muscle Pain chest Other reaction(s): Muscle Pain chest  . Statins     Other reaction(s): Muscle Pain chest    I personally reviewed active problem list, medication list, allergies, family history, social history with the patient/caregiver today.   ROS  Constitutional:  Negative for fever or weight change.  Respiratory: Negative for cough and shortness of breath.   Cardiovascular: Negative for chest pain or palpitations.  Gastrointestinal: Negative for abdominal pain, no bowel changes.  Musculoskeletal: Negative for gait problem or joint swelling.  Skin: Negative for rash.  Neurological: Negative for dizziness or headache.  No other specific complaints in a complete review of systems (except as listed in HPI above).  Objective  Virtual encounter, vitals not obtained.  There is no height or weight on file to calculate BMI.  Physical Exam  Decrease rom of neck, but able to move today, awake, alert and in no distress  PHQ2/9: Depression screen [facility] 2/9 05/17/2018 10/29/2017 10/07/2017 10/04/2017 03/01/2017  Decreased Interest 0 0 0 0 0  Down, Depressed, Hopeless 0 0 0 0 0  PHQ - 2 Score 0 0 0 0 0  Altered sleeping 0 0 0 - -  Tired, decreased energy 0 0 0 - -  Change in appetite 0 0 0 - -  Feeling bad or failure about yourself  0 0 0 - -  Trouble concentrating 0 0 0 - -  Moving slowly or fidgety/restless 0 0 0 - -  Suicidal thoughts 0 0 0 - -  PHQ-9 Score 0 0 0 - -  Difficult doing work/chores - Not difficult at all Not difficult at all - -   PHQ-2/9 Result is negative.    Fall Risk: Fall Risk  05/17/2018 01/20/2018 10/29/2017 10/07/2017 10/04/2017  Falls in the past year? 0 0 No No No  Number falls in past yr: 0 - - - -  Injury with Fall? 0 - - - -  Risk for fall due to : - - Impaired  balance/gait;Impaired vision Impaired vision -  Risk for fall due to: Comment - - - wears eyeglasses -  Follow up - - - - -     Assessment & Plan  1. Neck pain  - meloxicam (MOBIC) 15 MG tablet; Take 1 tablet (15 mg total) by mouth daily.  Dispense: 30 tablet; Refill: 0 - baclofen (LIORESAL) 10 MG tablet; Take 1-2 tablets (10-20 mg total) by mouth 3 (three) times daily.  Dispense: 30 each; Refill: 0  I discussed the assessment and treatment plan with the patient. The patient was provided an opportunity to ask questions and all were answered. The patient agreed with the plan and demonstrated an understanding of the instructions.  The patient was advised to call back or seek an in-person evaluation if the symptoms worsen or if the condition fails to improve as anticipated.  I provided 16  minutes of non-face-to-face time during this encounter.

## 2018-05-30 ENCOUNTER — Ambulatory Visit (INDEPENDENT_AMBULATORY_CARE_PROVIDER_SITE_OTHER): Payer: Medicare Other | Admitting: Family Medicine

## 2018-05-30 ENCOUNTER — Encounter: Payer: Self-pay | Admitting: Family Medicine

## 2018-05-30 ENCOUNTER — Other Ambulatory Visit: Payer: Self-pay

## 2018-05-30 VITALS — BP 146/74 | HR 85

## 2018-05-30 DIAGNOSIS — G5730 Lesion of lateral popliteal nerve, unspecified lower limb: Secondary | ICD-10-CM

## 2018-05-30 DIAGNOSIS — M170 Bilateral primary osteoarthritis of knee: Secondary | ICD-10-CM | POA: Diagnosis not present

## 2018-05-30 DIAGNOSIS — I10 Essential (primary) hypertension: Secondary | ICD-10-CM | POA: Diagnosis not present

## 2018-05-30 DIAGNOSIS — M542 Cervicalgia: Secondary | ICD-10-CM

## 2018-05-30 DIAGNOSIS — E782 Mixed hyperlipidemia: Secondary | ICD-10-CM | POA: Diagnosis not present

## 2018-05-30 DIAGNOSIS — M5090 Cervical disc disorder, unspecified, unspecified cervical region: Secondary | ICD-10-CM

## 2018-05-30 DIAGNOSIS — D320 Benign neoplasm of cerebral meninges: Secondary | ICD-10-CM | POA: Diagnosis not present

## 2018-05-30 DIAGNOSIS — K219 Gastro-esophageal reflux disease without esophagitis: Secondary | ICD-10-CM

## 2018-05-30 MED ORDER — ATORVASTATIN CALCIUM 40 MG PO TABS: 40.0000 mg | ORAL_TABLET | Freq: Every day | ORAL | 1 refills | Status: DC

## 2018-05-30 MED ORDER — LOSARTAN POTASSIUM 50 MG PO TABS: 50.0000 mg | ORAL_TABLET | Freq: Every day | ORAL | 1 refills | Status: DC

## 2018-05-30 MED ORDER — GABAPENTIN 300 MG PO CAPS: 300.0000 mg | ORAL_CAPSULE | Freq: Two times a day (BID) | ORAL | 0 refills | Status: DC

## 2018-05-30 MED ORDER — BACLOFEN 10 MG PO TABS: 10.0000 mg | ORAL_TABLET | Freq: Three times a day (TID) | ORAL | 0 refills | Status: DC

## 2018-05-30 MED ORDER — PANTOPRAZOLE SODIUM 40 MG PO TBEC: 40.0000 mg | DELAYED_RELEASE_TABLET | Freq: Every day | ORAL | 1 refills | Status: DC

## 2018-05-30 MED ORDER — AMLODIPINE BESYLATE 2.5 MG PO TABS: 2.5000 mg | ORAL_TABLET | Freq: Every day | ORAL | 1 refills | Status: DC

## 2018-05-30 NOTE — Progress Notes (Signed)
Name: Catherine Burgess   MRN: [id number]    DOB: [birth year]   Date:05/30/2018       Progress Note  Subjective  Chief Complaint  Chief Complaint  Patient presents with  . Hypertension  . Hyperlipidemia  . Gastroesophageal Reflux  . Medication Refill    I connected with  Catherine Burgess  on 05/30/18 at 11:20 AM EDT by a video enabled telemedicine application and verified that I am speaking with the correct person using two identifiers.  I discussed the limitations of evaluation and management by telemedicine and the availability of in person appointments. The patient expressed understanding and agreed to proceed. Staff also discussed with the patient that there may be a patient responsible charge related to this service. Patient Location: home Provider Location: [facility] Additional Individuals present: grandaughter - Catherine Burgess   HPI  Abnormal MRI brain from 2018 also abnormal CT brain from 06/2017. Last visit to [facility] for dizziness, and diagnosed with dehydration. CT showed calcified meningioma, she has an upcoming follow up with Dr. Manuella Ghazi in May .  Denies headaches, dizziness or memory changes. Occasionally has paresthesia on her hands, she has some down her ankles, but controlled with exercise  GERD: doing well with pantoprazole, denies heart burn , regurgitation or blood in stools. Denies change in bowel movements or appetite. She needs refill of PPI, discussed long term risk of medication   OA/ shoulder pain:/neck pain under the care of Ortho, taking gabapentin and tylenol, still uses a quad cane to help with ambulation. She had worsening of neck pain on April 7 th and had telemedicine visit with me. We gave her baclofen and nsaid's, she states she is doing better , she would like a refill to have at home to take prn only.   Hyperlipidemia: taking medication, denies side effects No myalgia, continue medication  HTN: bp was very high on her last office  visit ( in person - 01/2018 ), but usually at goal, at that time  she was out of losartan for over one week. she states when on higher dose of losartan bp was low and she was getting dizzy. However today bp is elevated again at 158/85, she rechecked at home after rest and it was 146/74 , she does not want to change at this time. She states at home usually 133-145. [name]   Patient Active Problem List   Diagnosis Date Noted  . Abnormal ankle brachial index (ABI) 03/02/2018  . Calcified cerebral meningioma ([place]) 10/04/2017  . History of CVA (cerebrovascular accident) without residual deficits 10/04/2017  . Subacromial impingement of right shoulder 10/04/2017  . Volvulus ([place])   . History of anemia 10/13/2016  . Syncope 07/23/2016  . Hypokalemia 07/23/2016  . Abnormal CT of brain 07/23/2016  . Vitamin D deficiency 10/28/2015  . Post menopausal syndrome 10/28/2015  . OP (osteoporosis) 10/28/2015  . Cervical disc disease 10/28/2015  . H/O total knee replacement 01/20/2015  . Vaginal atrophy 09/26/2014  . Osteoarthritis of both knees 07/31/2014  . GERD without esophagitis 07/31/2014  . HLD (hyperlipidemia) 07/31/2014  . Hypertension 07/31/2014    Past Surgical History:  Procedure Laterality Date  . CATARACT EXTRACTION, BILATERAL    . HIP PINNING Left 1956  . REPLACEMENT TOTAL KNEE Left 11/12/2009  . TOTAL KNEE REVISION Left 12/07/2016   Procedure: TOTAL KNEE REVISION;  Surgeon: Dereck Leep, MD;  Location: [facility] ORS;  Service: Orthopedics;  Laterality: Left;    Family History  Problem Relation Age of Onset  . Emphysema Father        Smoker  . CAD Mother   . Stroke Mother   . Diabetes Brother   . Seizures Brother   . Cancer Brother        unknown  . Diabetes Brother     Social History   Socioeconomic History  . Marital status: Divorced    Spouse name: Not on file  . Number of children: 1  . Years of education: Not on file  . Highest education level: 12th grade   Occupational History  . Occupation: Retired  Scientific laboratory technician  . Financial resource strain: Not hard at all  . Food insecurity:    Worry: Never true    Inability: Never true  . Transportation needs:    Medical: No    Non-medical: No  Tobacco Use  . Smoking status: Former Smoker    Packs/day: 0.25    Years: 1.00    Pack years: 0.25    Types: Cigarettes    Last attempt to quit: 1966    Years since quitting: 54.3  . Smokeless tobacco: Never Used  . Tobacco comment: smoking cessation materials not required  Substance and Sexual Activity  . Alcohol use: No    Alcohol/week: 0.0 standard drinks  . Drug use: No  . Sexual activity: Not Currently  Lifestyle  . Physical activity:    Days per week: 0 days    Minutes per session: 0 min  . Stress: Not at all  Relationships  . Social connections:    Talks on phone: Patient refused    Gets together: Patient refused    Attends religious service: Patient refused    Active member of club or organization: Patient refused    Attends meetings of clubs or organizations: Patient refused    Relationship status: Divorced  . Intimate partner violence:    Fear of current or ex partner: No    Emotionally abused: No    Physically abused: No    Forced sexual activity: No  Other Topics Concern  . Not on file  Social History Narrative  . Not on file     Current Outpatient Medications:  .  acetaminophen (TYLENOL) 500 MG tablet, Take 1 tablet (500 mg total) by mouth 2 (two) times daily. (Patient taking differently: Take 500 mg by mouth every 6 (six) hours as needed for mild pain. ), Disp: 180 tablet, Rfl: 0 .  amLODipine (NORVASC) 2.5 MG tablet, Take 1 tablet (2.5 mg total) by mouth daily., Disp: 90 tablet, Rfl: 1 .  aspirin EC 81 MG tablet, Take 81 mg by mouth., Disp: , Rfl:  .  Calcium-Vitamin D 600-200 MG-UNIT tablet, Take 1 tablet by mouth daily. , Disp: , Rfl:  .  fluticasone (FLONASE) 50 MCG/ACT nasal spray, Place into the nose., Disp: , Rfl:   .  gabapentin (NEURONTIN) 300 MG capsule, Take 1 capsule (300 mg total) by mouth 2 (two) times daily., Disp: 180 capsule, Rfl: 1 .  losartan (COZAAR) 50 MG tablet, Take 1 tablet (50 mg total) by mouth daily., Disp: 90 tablet, Rfl: 1 .  meloxicam (MOBIC) 15 MG tablet, Take 1 tablet (15 mg total) by mouth daily., Disp: 30 tablet, Rfl: 0 .  pantoprazole (PROTONIX) 40 MG tablet, Take 1 tablet (40 mg total) by mouth daily., Disp: 90 tablet, Rfl: 1 .  polyethylene glycol (MIRALAX / GLYCOLAX) packet, Take 17 g by mouth daily., Disp: 100 each, Rfl: 0 .  atorvastatin (LIPITOR) 40 MG tablet, Take 1 tablet (40 mg total) by mouth daily., Disp: 90 tablet, Rfl: 1 .  baclofen (LIORESAL) 10 MG tablet, Take 1-2 tablets (10-20 mg total) by mouth 3 (three) times daily. (Patient not taking: Reported on 05/30/2018), Disp: 30 each, Rfl: 0 .  triamcinolone cream (KENALOG) 0.1 %, APP EXT AA TWICE DAILY. DO NOT U LONGER THAN 2 CONS WKS, Disp: , Rfl: 0  Allergies  Allergen Reactions  . Lovastatin Other (See Comments)    chest  . Pravastatin     Other reaction(s): Muscle Pain chest  . Rosuvastatin     Other reaction(s): Muscle Pain chest Other reaction(s): Muscle Pain chest  . Statins     Other reaction(s): Muscle Pain chest    I personally reviewed active problem list, medication list, allergies, family history, social history with the patient/caregiver today.   ROS  Ten systems reviewed and is negative except as mentioned in HPI   Objective  Virtual encounter, vitals obtained at home and reviewed    Physical Exam  Awake, alert and oriented, no distress.   PHQ2/9: Depression screen [facility] 2/9 05/30/2018 05/17/2018 10/29/2017 10/07/2017 10/04/2017  Decreased Interest 0 0 0 0 0  Down, Depressed, Hopeless 0 0 0 0 0  PHQ - 2 Score 0 0 0 0 0  Altered sleeping 0 0 0 0 -  Tired, decreased energy 0 0 0 0 -  Change in appetite 0 0 0 0 -  Feeling bad or failure about yourself  0 0 0 0 -  Trouble concentrating  0 0 0 0 -  Moving slowly or fidgety/restless 0 0 0 0 -  Suicidal thoughts 0 0 0 0 -  PHQ-9 Score 0 0 0 0 -  Difficult doing work/chores - - Not difficult at all Not difficult at all -   PHQ-2/9 Result is negative.    Fall Risk: Fall Risk  05/30/2018 05/17/2018 01/20/2018 10/29/2017 10/07/2017  Falls in the past year? 0 0 0 No No  Number falls in past yr: 0 0 - - -  Injury with Fall? 0 0 - - -  Risk for fall due to : - - - Impaired balance/gait;Impaired vision Impaired vision  Risk for fall due to: Comment - - - - wears eyeglasses  Follow up - - - - -     Assessment & Plan  1. Essential hypertension  - amLODipine (NORVASC) 2.5 MG tablet; Take 1 tablet (2.5 mg total) by mouth daily.  Dispense: 90 tablet; Refill: 1 - losartan (COZAAR) 50 MG tablet; Take 1 tablet (50 mg total) by mouth daily.  Dispense: 90 tablet; Refill: 1  2. Osteoarthritis of both knees, unspecified osteoarthritis type  - gabapentin (NEURONTIN) 300 MG capsule; Take 1 capsule (300 mg total) by mouth 2 (two) times daily.  Dispense: 180 capsule; Refill: 1  3. Neck pain  - gabapentin (NEURONTIN) 300 MG capsule; Take 1 capsule (300 mg total) by mouth 2 (two) times daily.  Dispense: 180 capsule; Refill: 1 - meloxicam (MOBIC) 15 MG tablet; Take 1 tablet (15 mg total) by mouth daily.  Dispense: 30 tablet; Refill: 0  4. Mixed hyperlipidemia  - atorvastatin (LIPITOR) 40 MG tablet; Take 1 tablet (40 mg total) by mouth daily.  Dispense: 90 tablet; Refill: 1  5. GERD without esophagitis  - pantoprazole (PROTONIX) 40 MG tablet; Take 1 tablet (40 mg total) by mouth daily.  Dispense: 90 tablet; Refill: 1  6. Calcified cerebral meningioma ([place])  Seeing Dr. Manuella Ghazi   7. Cervical disc disease  - gabapentin (NEURONTIN) 300 MG capsule; Take 1 capsule (300 mg total) by mouth 2 (two) times daily. 300 mg in am and 600 mg in pm  Dispense: 270 capsule; Refill: 0  8. Disorder of common peroneal nerve  - gabapentin (NEURONTIN) 300 MG  capsule; Take 1 capsule (300 mg total) by mouth 2 (two) times daily. 300 mg in am and 600 mg in pm  Dispense: 270 capsule; Refill: 0 I discussed the assessment and treatment plan with the patient. The patient was provided an opportunity to ask questions and all were answered. The patient agreed with the plan and demonstrated an understanding of the instructions.  The patient was advised to call back or seek an in-person evaluation if the symptoms worsen or if the condition fails to improve as anticipated.  I provided 25 minutes of non-face-to-face time during this encounter.

## 2018-06-03 ENCOUNTER — Other Ambulatory Visit: Payer: Self-pay | Admitting: Family Medicine

## 2018-06-03 DIAGNOSIS — M170 Bilateral primary osteoarthritis of knee: Secondary | ICD-10-CM

## 2018-06-03 DIAGNOSIS — M542 Cervicalgia: Secondary | ICD-10-CM

## 2018-06-03 DIAGNOSIS — G5730 Lesion of lateral popliteal nerve, unspecified lower limb: Secondary | ICD-10-CM

## 2018-06-03 DIAGNOSIS — M5090 Cervical disc disorder, unspecified, unspecified cervical region: Secondary | ICD-10-CM

## 2018-06-14 ENCOUNTER — Other Ambulatory Visit: Payer: Self-pay | Admitting: Family Medicine

## 2018-06-14 DIAGNOSIS — M542 Cervicalgia: Secondary | ICD-10-CM

## 2018-06-14 NOTE — Telephone Encounter (Signed)
Refill request for general medication. Meloxicam to [facility].   Last office visit 05/30/2018   Follow up on 09/30/2018

## 2018-06-23 DIAGNOSIS — R200 Anesthesia of skin: Secondary | ICD-10-CM | POA: Diagnosis not present

## 2018-07-13 ENCOUNTER — Ambulatory Visit: Payer: Self-pay | Admitting: Family Medicine

## 2018-07-13 NOTE — Chronic Care Management (AMB) (Signed)
Chronic Care Management   Note  07/13/2018 Name: KALIEGH WILLADSEN MRN: [id number] DOB: 1935-08-08  Loreen Freud Hofbauer is a 83 y.o. year old female who is a primary care patient of Steele Sizer, MD. I reached out to [employer] by phone today in response to a referral sent by Ms. Messina P Cournoyer's health plan.    Ms. Dobratz was given information about Chronic Care Management services today including:  1. CCM service includes personalized support from designated clinical staff supervised by her physician, including individualized plan of care and coordination with other care providers 2. 24/7 contact phone numbers for assistance for urgent and routine care needs. 3. Service will only be billed when office clinical staff spend 20 minutes or more in a month to coordinate care. 4. Only one practitioner may furnish and bill the service in a calendar month. 5. The patient may stop CCM services at any time (effective at the end of the month) by phone call to the office staff. 6. The patient will be responsible for cost sharing (co-pay) of up to 20% of the service fee (after annual deductible is met).  Patient agreed to services and verbal consent obtained.   Follow up plan: Telephone appointment with CCM team member scheduled for: 07/18/2018  [place]  ??[email or web address]   ??[phone]

## 2018-07-18 ENCOUNTER — Other Ambulatory Visit: Payer: Self-pay

## 2018-07-18 ENCOUNTER — Ambulatory Visit (INDEPENDENT_AMBULATORY_CARE_PROVIDER_SITE_OTHER): Payer: Medicare Other

## 2018-07-18 DIAGNOSIS — I10 Essential (primary) hypertension: Secondary | ICD-10-CM | POA: Diagnosis not present

## 2018-07-18 DIAGNOSIS — M170 Bilateral primary osteoarthritis of knee: Secondary | ICD-10-CM | POA: Diagnosis not present

## 2018-07-18 NOTE — Patient Instructions (Addendum)
Thank you allowing the Chronic Care Management Team to be a part of your care! It was a pleasure speaking with you today!  1. Continue to take all your medications as prescribed 2. Please check your BP 3 times a week and record. We will discuss during our next telephone visit. 3. Continue to eat a low sodium diet. Extra salt will cause your Blood Pressure to rise and cause you to have some additional swelling in your legs. 4. Exercise as much as possible. I am so happy you are able to be active. Make sure you are walking early in the mornings or late in the evenings when it is not so hot or humid outdoors.  CCM (Chronic Care Management) Team   Trish Fountain RN, BSN Nurse Care Coordinator  [phone]  Ruben Reason PharmD  Clinical Pharmacist  [phone]   Elliot Gurney, LCSW Clinical Social Worker [phone]  Goals Addressed            This Visit's Progress   . I don't really check my blood pressure as often as I should (pt-stated)       Current Barriers:  [name] Knowledge Deficits related to basic understanding of hypertension pathophysiology and self care management  Nurse Case Manager Clinical Goal(s):   [name] Over the next 14 days, patient will demonstrate adherence to prescribed treatment plan for hypertension as evidenced by taking all medications as prescribed, monitoring and recording blood pressure as directed, adhering to low sodium/DASH diet  Interventions:  . Evaluation of current treatment plan related to hypertension self management and patient's adherence to plan as established by provider. . Provided education to patient re: stroke prevention, s/s of heart attack and stroke, DASH diet, complications of uncontrolled blood pressure . Reviewed medications with patient and discussed importance of compliance . Discussed plans with patient for ongoing care management follow up and provided patient with direct contact information for care management team . Advised  patient, providing education and rationale, to monitor blood pressure daily and record, calling PCP for findings outside established parameters.  . Reviewed scheduled/upcoming provider appointments including:   Patient Self Care Activities:  . Self administers medications as prescribed . Attends all scheduled provider appointments . Calls provider office for new concerns, questions, or BP outside discussed parameters . Checks BP and records as discussed . Follows a low sodium diet/DASH diet  Initial goal documentation        Print copy of patient instructions provided.   Telephone follow up appointment with care management team member scheduled for: 2 weeks  SYMPTOMS OF A STROKE   You have any symptoms of stroke. "BE FAST" is an easy way to remember the main warning signs: ? B - Balance. Signs are dizziness, sudden trouble walking, or loss of balance. ? E - Eyes. Signs are trouble seeing or a sudden change in how you see. ? F - Face. Signs are sudden weakness or loss of feeling of the face, or the face or eyelid drooping on one side. ? A - Arms. Signs are weakness or loss of feeling in an arm. This happens suddenly and usually on one side of the body. ? S - Speech. Signs are sudden trouble speaking, slurred speech, or trouble understanding what people say. ? T - Time. Time to call emergency services. Write down what time symptoms started.  You have other signs of stroke, such as: ? A sudden, very bad headache with no known cause. ? Feeling sick to your  stomach (nausea). ? Throwing up (vomiting). ? Jerky movements you cannot control (seizure).  SYMPTOMS OF A HEART ATTACK  What are the signs or symptoms? Symptoms of this condition include:  Chest pain. It may feel like: ? Crushing or squeezing. ? Tightness, pressure, fullness, or heaviness.  Pain in the arm, neck, jaw, back, or upper body.  Shortness of breath.  Heartburn.  Indigestion.  Nausea.  Cold  sweats.  Feeling tired.  Sudden lightheadedness.   Managing Your Hypertension Hypertension is commonly called high blood pressure. This is when the force of your blood pressing against the walls of your arteries is too strong. Arteries are blood vessels that carry blood from your heart throughout your body. Hypertension forces the heart to work harder to pump blood, and may cause the arteries to become narrow or stiff. Having untreated or uncontrolled hypertension can cause heart attack, stroke, kidney disease, and other problems. What are blood pressure readings? A blood pressure reading consists of a higher number over a lower number. Ideally, your blood pressure should be below 120/80. The first ("top") number is called the systolic pressure. It is a measure of the pressure in your arteries as your heart beats. The second ("bottom") number is called the diastolic pressure. It is a measure of the pressure in your arteries as the heart relaxes. What does my blood pressure reading mean? Blood pressure is classified into four stages. Based on your blood pressure reading, your health care provider may use the following stages to determine what type of treatment you need, if any. Systolic pressure and diastolic pressure are measured in a unit called mm Hg. Normal  Systolic pressure: below 354.  Diastolic pressure: below 80. Elevated  Systolic pressure: 562-563.  Diastolic pressure: below 80. Hypertension stage 1  Systolic pressure: 893-734.  Diastolic pressure: 28-76. Hypertension stage 2  Systolic pressure: 811 or above.  Diastolic pressure: 90 or above. What health risks are associated with hypertension? Managing your hypertension is an important responsibility. Uncontrolled hypertension can lead to:  A heart attack.  A stroke.  A weakened blood vessel (aneurysm).  Heart failure.  Kidney damage.  Eye damage.  Metabolic syndrome.  Memory and concentration  problems. What changes can I make to manage my hypertension? Hypertension can be managed by making lifestyle changes and possibly by taking medicines. Your health care provider will help you make a plan to bring your blood pressure within a normal range. Eating and drinking   Eat a diet that is high in fiber and potassium, and low in salt (sodium), added sugar, and fat. An example eating plan is called the DASH (Dietary Approaches to Stop Hypertension) diet. To eat this way: ? Eat plenty of fresh fruits and vegetables. Try to fill half of your plate at each meal with fruits and vegetables. ? Eat whole grains, such as whole wheat pasta, brown rice, or whole grain bread. Fill about one quarter of your plate with whole grains. ? Eat low-fat diary products. ? Avoid fatty cuts of meat, processed or cured meats, and poultry with skin. Fill about one quarter of your plate with lean proteins such as fish, chicken without skin, beans, eggs, and tofu. ? Avoid premade and processed foods. These tend to be higher in sodium, added sugar, and fat.  Reduce your daily sodium intake. Most people with hypertension should eat less than 1,500 mg of sodium a day.  Limit alcohol intake to no more than 1 drink a day for nonpregnant women and  2 drinks a day for men. One drink equals 12 oz of beer, 5 oz of wine, or 1 oz of hard liquor. Lifestyle  Work with your health care provider to maintain a healthy body weight, or to lose weight. Ask what an ideal weight is for you.  Get at least 30 minutes of exercise that causes your heart to beat faster (aerobic exercise) most days of the week. Activities may include walking, swimming, or biking.  Include exercise to strengthen your muscles (resistance exercise), such as weight lifting, as part of your weekly exercise routine. Try to do these types of exercises for 30 minutes at least 3 days a week.  Do not use any products that contain nicotine or tobacco, such as  cigarettes and e-cigarettes. If you need help quitting, ask your health care provider.  Control any long-term (chronic) conditions you have, such as high cholesterol or diabetes. Monitoring  Monitor your blood pressure at home as told by your health care provider. Your personal target blood pressure may vary depending on your medical conditions, your age, and other factors.  Have your blood pressure checked regularly, as often as told by your health care provider. Working with your health care provider  Review all the medicines you take with your health care provider because there may be side effects or interactions.  Talk with your health care provider about your diet, exercise habits, and other lifestyle factors that may be contributing to hypertension.  Visit your health care provider regularly. Your health care provider can help you create and adjust your plan for managing hypertension. Will I need medicine to control my blood pressure? Your health care provider may prescribe medicine if lifestyle changes are not enough to get your blood pressure under control, and if:  Your systolic blood pressure is 130 or higher.  Your diastolic blood pressure is 80 or higher. Take medicines only as told by your health care provider. Follow the directions carefully. Blood pressure medicines must be taken as prescribed. The medicine does not work as well when you skip doses. Skipping doses also puts you at risk for problems. Contact a health care provider if:  You think you are having a reaction to medicines you have taken.  You have repeated (recurrent) headaches.  You feel dizzy.  You have swelling in your ankles.  You have trouble with your vision. Get help right away if:  You develop a severe headache or confusion.  You have unusual weakness or numbness, or you feel faint.  You have severe pain in your chest or abdomen.  You vomit repeatedly.  You have trouble  breathing. Summary  Hypertension is when the force of blood pumping through your arteries is too strong. If this condition is not controlled, it may put you at risk for serious complications.  Your personal target blood pressure may vary depending on your medical conditions, your age, and other factors. For most people, a normal blood pressure is less than 120/80.  Hypertension is managed by lifestyle changes, medicines, or both. Lifestyle changes include weight loss, eating a healthy, low-sodium diet, exercising more, and limiting alcohol. This information is not intended to replace advice given to you by your health care provider. Make sure you discuss any questions you have with your health care provider. Document Released: 10/21/2011 Document Revised: 12/25/2015 Document Reviewed: 12/25/2015 Elsevier Interactive Patient Education  2019 [employer].    Low-Sodium Eating Plan Sodium, which is an element that makes up salt, helps you maintain  a healthy balance of fluids in your body. Too much sodium can increase your blood pressure and cause fluid and waste to be held in your body. Your health care provider or dietitian may recommend following this plan if you have high blood pressure (hypertension), kidney disease, liver disease, or heart failure. Eating less sodium can help lower your blood pressure, reduce swelling, and protect your heart, liver, and kidneys. What are tips for following this plan? General guidelines  Most people on this plan should limit their sodium intake to 1,500-2,000 mg (milligrams) of sodium each day. Reading food labels   The Nutrition Facts label lists the amount of sodium in one serving of the food. If you eat more than one serving, you must multiply the listed amount of sodium by the number of servings.  Choose foods with less than 140 mg of sodium per serving.  Avoid foods with 300 mg of sodium or more per serving. Shopping  Look for lower-sodium  products, often labeled as "low-sodium" or "no salt added."  Always check the sodium content even if foods are labeled as "unsalted" or "no salt added".  Buy fresh foods. ? Avoid canned foods and premade or frozen meals. ? Avoid canned, cured, or processed meats  Buy breads that have less than 80 mg of sodium per slice. Cooking  Eat more home-cooked food and less restaurant, buffet, and fast food.  Avoid adding salt when cooking. Use salt-free seasonings or herbs instead of table salt or sea salt. Check with your health care provider or pharmacist before using salt substitutes.  Cook with plant-based oils, such as canola, sunflower, or olive oil. Meal planning  When eating at a restaurant, ask that your food be prepared with less salt or no salt, if possible.  Avoid foods that contain MSG (monosodium glutamate). MSG is sometimes added to [place] food, bouillon, and some canned foods. What foods are recommended? The items listed may not be a complete list. Talk with your dietitian about what dietary choices are best for you. Grains Low-sodium cereals, including oats, puffed wheat and rice, and shredded wheat. Low-sodium crackers. Unsalted rice. Unsalted pasta. Low-sodium bread. Whole-grain breads and whole-grain pasta. Vegetables Fresh or frozen vegetables. "No salt added" canned vegetables. "No salt added" tomato sauce and paste. Low-sodium or reduced-sodium tomato and vegetable juice. Fruits Fresh, frozen, or canned fruit. Fruit juice. Meats and other protein foods Fresh or frozen (no salt added) meat, poultry, seafood, and fish. Low-sodium canned tuna and salmon. Unsalted nuts. Dried peas, beans, and lentils without added salt. Unsalted canned beans. Eggs. Unsalted nut butters. Dairy Milk. Soy milk. Cheese that is naturally low in sodium, such as ricotta cheese, fresh mozzarella, or Swiss cheese Low-sodium or reduced-sodium cheese. Cream cheese. Yogurt. Fats and oils Unsalted  butter. Unsalted margarine with no trans fat. Vegetable oils such as canola or olive oils. Seasonings and other foods Fresh and dried herbs and spices. Salt-free seasonings. Low-sodium mustard and ketchup. Sodium-free salad dressing. Sodium-free light mayonnaise. Fresh or refrigerated horseradish. Lemon juice. Vinegar. Homemade, reduced-sodium, or low-sodium soups. Unsalted popcorn and pretzels. Low-salt or salt-free chips. What foods are not recommended? The items listed may not be a complete list. Talk with your dietitian about what dietary choices are best for you. Grains Instant hot cereals. Bread stuffing, pancake, and biscuit mixes. Croutons. Seasoned rice or pasta mixes. Noodle soup cups. Boxed or frozen macaroni and cheese. Regular salted crackers. Self-rising flour. Vegetables Sauerkraut, pickled vegetables, and relishes. Olives. [place] fries. Onion rings.  Regular canned vegetables (not low-sodium or reduced-sodium). Regular canned tomato sauce and paste (not low-sodium or reduced-sodium). Regular tomato and vegetable juice (not low-sodium or reduced-sodium). Frozen vegetables in sauces. Meats and other protein foods Meat or fish that is salted, canned, smoked, spiced, or pickled. Bacon, ham, sausage, hotdogs, corned beef, chipped beef, packaged lunch meats, salt pork, jerky, pickled herring, anchovies, regular canned tuna, sardines, salted nuts. Dairy Processed cheese and cheese spreads. Cheese curds. Blue cheese. Feta cheese. String cheese. Regular cottage cheese. Buttermilk. Canned milk. Fats and oils Salted butter. Regular margarine. Ghee. Bacon fat. Seasonings and other foods Onion salt, garlic salt, seasoned salt, table salt, and sea salt. Canned and packaged gravies. Worcestershire sauce. Tartar sauce. Barbecue sauce. Teriyaki sauce. Soy sauce, including reduced-sodium. Steak sauce. Fish sauce. Oyster sauce. Cocktail sauce. Horseradish that you find on the shelf. Regular ketchup and  mustard. Meat flavorings and tenderizers. Bouillon cubes. Hot sauce and Tabasco sauce. Premade or packaged marinades. Premade or packaged taco seasonings. Relishes. Regular salad dressings. Salsa. Potato and tortilla chips. Corn chips and puffs. Salted popcorn and pretzels. Canned or dried soups. Pizza. Frozen entrees and pot pies. Summary  Eating less sodium can help lower your blood pressure, reduce swelling, and protect your heart, liver, and kidneys.  Most people on this plan should limit their sodium intake to 1,500-2,000 mg (milligrams) of sodium each day.  Canned, boxed, and frozen foods are high in sodium. Restaurant foods, fast foods, and pizza are also very high in sodium. You also get sodium by adding salt to food.  Try to cook at home, eat more fresh fruits and vegetables, and eat less fast food, canned, processed, or prepared foods. This information is not intended to replace advice given to you by your health care provider. Make sure you discuss any questions you have with your health care provider. Document Released: 07/18/2001 Document Revised: 01/20/2016 Document Reviewed: 01/20/2016 Elsevier Interactive Patient Education  2019 [employer].
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
## 2018-08-02 ENCOUNTER — Ambulatory Visit: Payer: Self-pay | Admitting: Pharmacist

## 2018-08-02 DIAGNOSIS — I10 Essential (primary) hypertension: Secondary | ICD-10-CM

## 2018-08-02 NOTE — Chronic Care Management (AMB) (Signed)
  Chronic Care Management   Note  08/02/2018 Name: Catherine Burgess MRN: [id number] DOB: 04-04-1935  83 y.o. year old female referred to [facility] clinical pharmacist via internal referral by [facility] Trish Fountain regarding patient's confusion surrounding HTN medication.   Was unable to reach patient via telephone today and have left HIPAA compliant voicemail asking patient to return my call. (unsuccessful outreach #1).  Follow up plan: A HIPPA compliant phone message was left for the patient providing contact information and requesting a return call.  The care management team will reach out to the patient again over the next 5-7 days.   Ruben Reason, PharmD Clinical Pharmacist [facility] [phone]

## 2018-08-05 ENCOUNTER — Telehealth: Payer: Self-pay

## 2018-08-05 ENCOUNTER — Ambulatory Visit: Payer: Self-pay

## 2018-08-05 ENCOUNTER — Other Ambulatory Visit: Payer: Self-pay

## 2018-08-05 ENCOUNTER — Ambulatory Visit (INDEPENDENT_AMBULATORY_CARE_PROVIDER_SITE_OTHER): Payer: Medicare Other | Admitting: Nurse Practitioner

## 2018-08-05 ENCOUNTER — Encounter: Payer: Self-pay | Admitting: Nurse Practitioner

## 2018-08-05 VITALS — BP 138/64 | HR 86 | Temp 98.6°F | Resp 14 | Ht 63.0 in | Wt 149.4 lb

## 2018-08-05 DIAGNOSIS — S80861A Insect bite (nonvenomous), right lower leg, initial encounter: Secondary | ICD-10-CM

## 2018-08-05 DIAGNOSIS — W57XXXA Bitten or stung by nonvenomous insect and other nonvenomous arthropods, initial encounter: Secondary | ICD-10-CM | POA: Diagnosis not present

## 2018-08-05 DIAGNOSIS — I10 Essential (primary) hypertension: Secondary | ICD-10-CM

## 2018-08-05 DIAGNOSIS — L309 Dermatitis, unspecified: Secondary | ICD-10-CM

## 2018-08-05 NOTE — Chronic Care Management (AMB) (Signed)
   Chronic Care Management   Unsuccessful Call Note 08/05/2018 Name: [name] MRN: [id number] DOB: 1935/05/20  Catherine P Jeffriesis a 83 y.o.year old femalewho is a primary care patient of Steele Sizer, MD. Catherine Burgess was referred to CCM services by her health plan. Catherine Burgess has a history of but not limited to HTN, Calcified cerebral meningioma, HLD, and CVA.    Was unable to reach patient via telephone today for follow up. I have left HIPAA compliant voicemail asking patient to return my call. (unsuccessful outreach #1).   Plan: Will follow-up within 14 business days via telephone.      Coltin Casher E. Rollene Rotunda, RN, BSN Nurse Care Coordinator [facility] Care Management  [phone]

## 2018-08-05 NOTE — Progress Notes (Signed)
Name: Catherine Burgess   MRN: [id number]    DOB: [birth year]   Date:08/05/2018       Progress Note  Subjective  Chief Complaint  Chief Complaint  Patient presents with  . Skin Problem    Tick bite, noticed this morning.     HPI  Patient pulled out tick on right lower leg this morning cleaned area with alcohol and iodine.  Patient states she was out in the garden yesterday evening thinks it got she got it there.  Denies pain, arthralgias, fever, chills, nausea, vomiting.  PHQ2/9: Depression screen [facility] 2/9 07/18/2018 05/30/2018 05/17/2018 10/29/2017 10/07/2017  Decreased Interest 0 0 0 0 0  Down, Depressed, Hopeless 0 0 0 0 0  PHQ - 2 Score 0 0 0 0 0  Altered sleeping - 0 0 0 0  Tired, decreased energy - 0 0 0 0  Change in appetite - 0 0 0 0  Feeling bad or failure about yourself  - 0 0 0 0  Trouble concentrating - 0 0 0 0  Moving slowly or fidgety/restless - 0 0 0 0  Suicidal thoughts - 0 0 0 0  PHQ-9 Score - 0 0 0 0  Difficult doing work/chores - - - Not difficult at all Not difficult at all     PHQ reviewed. Negative  Patient Active Problem List   Diagnosis Date Noted  . Abnormal ankle brachial index (ABI) 03/02/2018  . Calcified cerebral meningioma ([place]) 10/04/2017  . History of CVA (cerebrovascular accident) without residual deficits 10/04/2017  . Subacromial impingement of right shoulder 10/04/2017  . Volvulus ([place])   . History of anemia 10/13/2016  . Syncope 07/23/2016  . Hypokalemia 07/23/2016  . Abnormal CT of brain 07/23/2016  . Vitamin D deficiency 10/28/2015  . Post menopausal syndrome 10/28/2015  . OP (osteoporosis) 10/28/2015  . Cervical disc disease 10/28/2015  . H/O total knee replacement 01/20/2015  . Vaginal atrophy 09/26/2014  . Osteoarthritis of both knees 07/31/2014  . GERD without esophagitis 07/31/2014  . HLD (hyperlipidemia) 07/31/2014  . Hypertension 07/31/2014    Past Medical History:  Diagnosis Date  . Degenerative joint disease   .  Hyperlipidemia   . Hypertension   . Malignant essential hypertension 07/23/2016  . Osteoarthrosis   . Reflux esophagitis     Past Surgical History:  Procedure Laterality Date  . CATARACT EXTRACTION, BILATERAL    . HIP PINNING Left 1956  . REPLACEMENT TOTAL KNEE Left 11/12/2009  . TOTAL KNEE REVISION Left 12/07/2016   Procedure: TOTAL KNEE REVISION;  Surgeon: Dereck Leep, MD;  Location: [facility] ORS;  Service: Orthopedics;  Laterality: Left;    Social History   Tobacco Use  . Smoking status: Former Smoker    Packs/day: 0.25    Years: 1.00    Pack years: 0.25    Types: Cigarettes    Quit date: 1966    Years since quitting: 54.5  . Smokeless tobacco: Never Used  . Tobacco comment: smoking cessation materials not required  Substance Use Topics  . Alcohol use: No    Alcohol/week: 0.0 standard drinks     Current Outpatient Medications:  .  acetaminophen (TYLENOL) 500 MG tablet, Take 1 tablet (500 mg total) by mouth 2 (two) times daily. (Patient taking differently: Take 500 mg by mouth every 6 (six) hours as needed for mild pain. ), Disp: 180 tablet, Rfl: 0 .  amLODipine (NORVASC) 2.5 MG tablet, Take 1 tablet (2.5 mg total) by mouth daily.,  Disp: 90 tablet, Rfl: 1 .  aspirin EC 81 MG tablet, Take 81 mg by mouth., Disp: , Rfl:  .  atorvastatin (LIPITOR) 40 MG tablet, Take 1 tablet (40 mg total) by mouth daily., Disp: 90 tablet, Rfl: 1 .  baclofen (LIORESAL) 10 MG tablet, Take 1-2 tablets (10-20 mg total) by mouth 3 (three) times daily., Disp: 30 each, Rfl: 0 .  Calcium-Vitamin D 600-200 MG-UNIT tablet, Take 1 tablet by mouth daily. , Disp: , Rfl:  .  fluticasone (FLONASE) 50 MCG/ACT nasal spray, Place into the nose., Disp: , Rfl:  .  gabapentin (NEURONTIN) 300 MG capsule, TAKE ONE CAPSULE BY MOUTH TWICE DAILY (Patient taking differently: Take by mouth 2 (two) times daily. Take 300mg  in the morning and 600 mg at bedtime), Disp: 180 capsule, Rfl: 1 .  losartan (COZAAR) 50 MG tablet,  Take 1 tablet (50 mg total) by mouth daily., Disp: 90 tablet, Rfl: 1 .  meloxicam (MOBIC) 15 MG tablet, Take 1 tablet by mouth daily as needed., Disp: , Rfl:  .  pantoprazole (PROTONIX) 40 MG tablet, Take 1 tablet (40 mg total) by mouth daily., Disp: 90 tablet, Rfl: 1 .  polyethylene glycol (MIRALAX / GLYCOLAX) packet, Take 17 g by mouth daily., Disp: 100 each, Rfl: 0 .  triamcinolone cream (KENALOG) 0.1 %, APP EXT AA TWICE DAILY. DO NOT U LONGER THAN 2 CONS WKS, Disp: , Rfl: 0 .  vitamin B-12 (CYANOCOBALAMIN) 1000 MCG tablet, Take 1,000 mcg by mouth daily., Disp: , Rfl:   Allergies  Allergen Reactions  . Lovastatin Other (See Comments)    chest  . Pravastatin     Other reaction(s): Muscle Pain chest  . Rosuvastatin     Other reaction(s): Muscle Pain chest Other reaction(s): Muscle Pain chest  . Statins     Other reaction(s): Muscle Pain chest    ROS   No other specific complaints in a complete review of systems (except as listed in HPI above).  Objective  Vitals:   08/05/18 1156  BP: 138/64  Pulse: 86  Resp: 14  Temp: 98.6 F (37 C)  TempSrc: Oral  SpO2: 95%  Weight: 149 lb 6.4 oz (67.8 kg)  Height: 5\' 3"  (1.6 m)     Body mass index is 26.47 kg/m.  Nursing Note and Vital Signs reviewed.  Physical Exam Constitutional:      Appearance: Normal appearance.  Cardiovascular:     Rate and Rhythm: Normal rate.  Pulmonary:     Effort: Pulmonary effort is normal.  Skin:    General: Skin is warm and dry.       Neurological:     General: No focal deficit present.     Mental Status: She is alert and oriented to person, place, and time.  Psychiatric:        Mood and Affect: Mood normal.        Behavior: Behavior normal.        No results found for this or any previous visit (from the past 48 hour(s)).  Assessment & Plan 1. Tick bite of right lower leg, initial encounter No concerning infection discussed watching.  Can use ice to reduce pain and mild  swelling.  Can put Neosporin on area.  If she develops symptoms discussed return to care immediately  2. Dermatitis Cold compress on small insect bite   -Red flags and when to present for emergency care or RTC including fever >101.57F, chest pain, shortness of breath, new/worsening/un-resolving symptoms, reviewed  with patient at time of visit. Follow up and care instructions discussed and provided in AVS.

## 2018-08-05 NOTE — Patient Instructions (Addendum)
- You can use cold compress on the area to help with pain and swelling. You can put a antiboitic ointment like neosporin on it twice a day for a week.  - If you develop a rash, joint pains, fever, nausea, vomiting, abdominal pain please let us know immediately.  Tick Bite Information, Adult Ticks are insects that draw blood for food. Most ticks live in shrubs and grassy areas. They climb onto people and animals that brush against the leaves and grasses that they rest on. Then they bite, attaching themselves to the skin. Most ticks are harmless, but some ticks carry germs that can spread to a person through a bite and cause a disease. To reduce your risk of getting a disease from a tick bite, it is important to take steps to prevent tick bites. It is also important to check for ticks after being outdoors. If you find that a tick has attached to you, watch for symptoms of disease. How can I prevent tick bites? Take these steps to help prevent tick bites when you are outdoors in an area where ticks are found:  Use insect repellent that has DEET (20% or higher), picaridin, or IR3535 in it. Use it on: ? Skin that is showing. ? The top of your boots. ? Your pant legs. ? Your sleeve cuffs.  For repellent products that contain permethrin, follow product instructions. Use these products on: ? Clothing. ? Gear. ? Boots. ? Tents.  Wear protective clothing. Long sleeves and long pants offer the best protection from ticks.  Wear light-colored clothing so you can see ticks more easily.  Tuck your pant legs into your socks.  If you go walking on a trail, stay in the middle of the trail so your skin, hair, and clothing do not touch the bushes.  Avoid walking through areas with long grass.  Check for ticks on your clothing, hair, and skin often while you are outside, and check again before you go inside. Make sure to check the places that ticks attach themselves most often. These places include the  scalp, neck, armpits, waist, groin, and joint areas. Ticks that carry a disease called Lyme disease have to be attached to the skin for 24-48 hours. Checking for ticks every day will lessen your risk of this and other diseases.  When you come indoors, wash your clothes and take a shower or a bath right away. Dry your clothes in a dryer on high heat for at least 60 minutes. This will kill any ticks in your clothes. What is the proper way to remove a tick? If you find a tick on your body, remove it as soon as possible. Removing a tick sooner rather than later can prevent germs from passing from the tick to your body. To remove a tick that is crawling on your skin but has not bitten:  Go outdoors and brush the tick off.  Remove the tick with tape or a lint roller. To remove a tick that is attached to your skin:  Wash your hands.  If you have latex gloves, put them on.  Use tweezers, curved forceps, or a tick-removal tool to gently grasp the tick as close to your skin and the tick's head as possible.  Gently pull with steady, upward pressure until the tick lets go. When removing the tick: ? Take care to keep the tick's head attached to its body. ? Do not twist or jerk the tick. This can make the tick's head or  mouth break off. ? Do not squeeze or crush the tick's body. This could force disease-carrying fluids from the tick into your body. Do not try to remove a tick with heat, alcohol, petroleum jelly, or fingernail polish. Using these methods can cause the tick to salivate and regurgitate into your bloodstream, increasing your risk of getting a disease. What should I do after removing a tick?  Clean the bite area with soap and water, rubbing alcohol, or an iodine scrub.  If an antiseptic cream or ointment is available, apply a small amount to the bite site.  Wash and disinfect any instruments that you used to remove the tick. How should I dispose of a tick? To dispose of a live tick, use  one of these methods:  Place it in rubbing alcohol.  Place it in a sealed bag or container.  Wrap it tightly in tape.  Flush it down the toilet. Contact a health care provider if:  You have symptoms of a disease after a tick bite. Symptoms of a tick-borne disease can occur from moments after the tick bites to up to 30 days after a tick is removed. Symptoms include: ? Muscle, joint, or bone pain. ? Difficulty walking or moving your legs. ? Numbness in the legs. ? Paralysis. ? Red rash around the tick bite area that is shaped like a target or a "bull's-eye." ? Redness and swelling in the area of the tick bite. ? Fever. ? Repeated vomiting. ? Diarrhea. ? Weight loss. ? Tender, swollen lymph glands. ? Shortness of breath. ? Cough. ? Pain in the abdomen. ? Headache. ? Abnormal tiredness. ? A change in your level of consciousness. ? Confusion. Get help right away if:  You are not able to remove a tick.  A part of a tick breaks off and gets stuck in your skin.  Your symptoms get worse. Summary  Ticks may carry germs that can spread to a person through a bite and cause disease.  Wear protective clothing and use insect repellent to prevent tick bites. Follow product instructions.  If you find a tick on your body, remove it as soon as possible. If the tick is attached, do not try to remove with heat, alcohol, petroleum jelly, or fingernail polish.  Remove the attached tick using tweezers, curved forceps, or a tick-removal tool. Gently pull with steady, upward pressure until the tick lets go. Do not twist or jerk the tick. Do not squeeze or crush the tick's body.  If you have symptoms after being bitten by a tick, contact a health care provider. This information is not intended to replace advice given to you by your health care provider. Make sure you discuss any questions you have with your health care provider. Document Released: 01/24/2000 Document Revised: 01/08/2017  Document Reviewed: 11/08/2015 Elsevier Patient Education  2020 [employer].

## 2018-08-09 ENCOUNTER — Ambulatory Visit: Payer: Self-pay | Admitting: Pharmacist

## 2018-08-09 DIAGNOSIS — I10 Essential (primary) hypertension: Secondary | ICD-10-CM | POA: Diagnosis not present

## 2018-08-09 DIAGNOSIS — Z8673 Personal history of transient ischemic attack (TIA), and cerebral infarction without residual deficits: Secondary | ICD-10-CM

## 2018-08-10 NOTE — Chronic Care Management (AMB) (Signed)
  Chronic Care Management   Follow Up Note   08/10/2018 Name: TAKIAH MAIDEN MRN: [id number] DOB: [birth year]  Subjective Thereasa P Thole is a 83 y.o. year old female who is a primary care patient of Steele Sizer, MD. The CCM clinical pharmacist contacted the patient today regarding blood pressure medication via internal referral from [facility]. HIPAA identifiers verified.  Assessment: Review of patient status, including review of consultants reports, relevant laboratory and other test results, and collaboration with appropriate care team members and the patient's provider was performed as part of comprehensive patient evaluation and provision of chronic care management services.    Patient reported 7 recorded BP and pulse readings from past 2 weeks. Reviewed proper technique for home BP monitoring. Home BP readings ranged from 130-150/70-80 mm/Hg and pulse ranged from 50-69 bpm.   Goals Addressed            This Visit's Progress   . Blood pressure medicines (pt-stated)       Current Barriers:  [name] Knowledge Deficits related to medicines used to treat high blood pressure  Pharmacist Clinical Goal(s):  [name] Over the next 30 days, patient will demonstrate improved understanding of prescribed medications and rationale for usage as evidenced by patient report  Interventions: . Comprehensive medication review performed. [name] Collaboration with RN Care Manager re: blood pressure education . Education around the [place]  Patient Self Care Activities:  . Self administers medications as prescribed . Check blood pressure at home and record blood pressures in notebook  Initial goal documentation         Telephone follow up appointment with care management team member scheduled for: 30 days with PharmD   Ruben Reason, PharmD Clinical Pharmacist [facility] [phone]

## 2018-08-10 NOTE — Patient Instructions (Signed)
Thank you for taking the time to speak with me today!   Please call a member of the CCM (Chronic Care Management) Team with any questions or case management needs:   Vanetta Mulders, BSN Nurse Care Coordinator  [phone]  Ruben Reason, PharmD  Clinical Pharmacist  [phone]  Elliot Gurney, LCSW Clinical Social Worker [phone]  Goals Addressed            This Visit's Progress   . Blood pressure medicines (pt-stated)       Current Barriers:  [name] Knowledge Deficits related to medicines used to treat high blood pressure  Pharmacist Clinical Goal(s):  [name] Over the next 30 days, patient will demonstrate improved understanding of prescribed medications and rationale for usage as evidenced by patient report  Interventions: . Comprehensive medication review performed. [name] Collaboration with RN Care Manager re: blood pressure education . Education around the [place]  Patient Self Care Activities:  . Self administers medications as prescribed . Check blood pressure at home and record blood pressures in notebook  Initial goal documentation

## 2018-08-17 ENCOUNTER — Ambulatory Visit: Payer: Self-pay

## 2018-08-17 ENCOUNTER — Telehealth: Payer: Self-pay

## 2018-08-17 NOTE — Chronic Care Management (AMB) (Signed)
   Chronic Care Management   Unsuccessful Call Note 08/05/2018 Name: Catherine Burgess       MRN: [id number]       DOB: [birth year]  Mccartney P Jeffriesis a 83 y.o.year old femalewho is a primary care patient of Steele Sizer, MD. Ms. Wilms was referred to CCM services by her health plan. Ms. Surrette has a history of but not limited to HTN, Calcified cerebral meningioma, HLD, and CVA.   Was unable to reach patient via telephone today forfollow up. Ihave left HIPAA compliant voicemail asking patient to return my call. (unsuccessful outreach #[phone]).  Plan: Will follow-up within 14business days via telephone.    Lorrine Killilea E. Rollene Rotunda, RN, BSN Nurse Care Coordinator [facility] Care Management  [phone]

## 2018-08-24 ENCOUNTER — Ambulatory Visit (INDEPENDENT_AMBULATORY_CARE_PROVIDER_SITE_OTHER): Payer: Medicare Other

## 2018-08-24 DIAGNOSIS — I10 Essential (primary) hypertension: Secondary | ICD-10-CM | POA: Diagnosis not present

## 2018-08-24 NOTE — Chronic Care Management (AMB) (Signed)
Chronic Care Management   Follow Up Note   08/24/2018 Name: Catherine Burgess MRN: [id number] DOB: 1935-06-18  Referred by: Steele Sizer, MD Reason for referral : Chronic Care Management (patient returning call)   Subjective: "I think I am still doing real good but it is just too hot to walk right now"   Objective:  BP Readings from Last 3 Encounters:  08/05/18 138/64  05/30/18 (!) 146/74  05/17/18 133/73     Assessment: Catherine P Jeffriesis a 83 y.o.year old femalewho is a primary care patient of Steele Sizer, MD. [name] was referred to CCM services by her health plan. Ms. Roan has a history of but not limited to HTN, Calcified cerebral meningioma, HLD, and CVA. Patient previously established health goals but RN CM has been unable to successfully contact for follow up. Today, patient returned call and discussed her progression towards goals.  Review of patient status, including review of consultants reports, relevant laboratory and other test results, and collaboration with appropriate care team members and the patient's provider was performed as part of comprehensive patient evaluation and provision of chronic care management services.    Goals Addressed            This Visit's Progress   . I don't really check my blood pressure as often as I should (pt-stated)       Catherine Burgess continues to do well managing her high blood pressure. She is checking her pressure several times weekly and sometimes even daily. She is recording the readings and today reports an average of 130s/70s. She continues to monitor her salt intake. She denies significant swelling although the head and humidity has caused her intermittent lower extremity edema. She continues to take her medication as prescribed. She is very active and would like to walk daily but reports "it is just too hot". RN CM suggested she walk very early in the mornings or late in the evenings while it is still light  so that the humidity and temperatures are lower. She continues to social distance, wear a mask, and use hand hygiene. She is keeping a small circle of family close so that she does not feel isolated.   Current Barriers:  Catherine Burgess Knowledge Deficits related to basic understanding of hypertension pathophysiology and self care management  Nurse Case Manager Clinical Goal(s):   Catherine Burgess Over the next 14 days, patient will demonstrate adherence to prescribed treatment plan for hypertension as evidenced by taking all medications as prescribed, monitoring and recording blood pressure as directed, adhering to low sodium/DASH diet-goal met 08/24/2018 . Over the next 30 days, patient will continue to adhere to hypertension self care management plan of care  Interventions:  . Assessed for compliance with HTN self care management plan of care . Reviewed home BP log . Reviewed dietary choices and discussed importance of low sodium diet . Assess for for daily exercise and encouraged patient to walk daily as weather permits . Discussed plans with patient for ongoing care management follow up and provided patient with direct contact information for care management team . Advised patient, providing education and rationale, to monitor blood pressure daily and record, calling PCP for findings outside established parameters.   Patient Self Care Activities:  . Self administers medications as prescribed . Attends all scheduled provider appointments . Calls provider office for new concerns, questions, or BP outside discussed parameters . Checks BP and records as discussed . Follows a low sodium diet/DASH diet  Please see past updates  related to this goal by clicking on the "Past Updates" button in the selected goal          Telephone follow up appointment with care management team member scheduled for: 1 month     [name] Rotunda, RN, BSN Nurse Care Coordinator [facility] Care Management  [phone]

## 2018-08-24 NOTE — Patient Instructions (Addendum)
Thank you allowing the Chronic Care Management Team to be a part of your care! It was a pleasure speaking with you today!  1. Continue to take all your medications as prescribed 2. Please continue to  check your BP 3 times a week and record. We will discuss during our next telephone visit. 3. Continue to eat a low sodium diet. Extra salt will cause your Blood Pressure to rise and cause you to have some additional swelling in your legs. 4. Exercise as much as possible. I am so happy you are able to be active. Make sure you are walking early in the mornings or late in the evenings when it is not so hot or humid outdoors.  CCM (Chronic Care Management) Team   Trish Fountain RN, BSN Nurse Care Coordinator  [phone]  Ruben Reason PharmD  Clinical Pharmacist  [phone]   Elliot Gurney, LCSW Clinical Social Worker [phone]  Goals Addressed            This Visit's Progress   . I don't really check my blood pressure as often as I should (pt-stated)       Current Barriers:  [name] Knowledge Deficits related to basic understanding of hypertension pathophysiology and self care management  Nurse Case Manager Clinical Goal(s):   [name] Over the next 14 days, patient will demonstrate adherence to prescribed treatment plan for hypertension as evidenced by taking all medications as prescribed, monitoring and recording blood pressure as directed, adhering to low sodium/DASH diet-goal met 08/24/2018 . Over the next 30 days, patient will continue to adhere to hypertension self care management plan of care  Interventions:  . Assessed for compliance with HTN self care management plan of care . Reviewed home BP log . Reviewed dietary choices and discussed importance of low sodium diet . Assess for for daily exercise and encouraged patient to walk daily as weather permits . Discussed plans with patient for ongoing care management follow up and provided patient with direct contact information  for care management team . Advised patient, providing education and rationale, to monitor blood pressure daily and record, calling PCP for findings outside established parameters.   Patient Self Care Activities:  . Self administers medications as prescribed . Attends all scheduled provider appointments . Calls provider office for new concerns, questions, or BP outside discussed parameters . Checks BP and records as discussed . Follows a low sodium diet/DASH diet  Please see past updates related to this goal by clicking on the "Past Updates" button in the selected goal         The patient verbalized understanding of instructions provided today and declined a print copy of patient instruction materials.   Telephone follow up appointment with care management team member scheduled for: 30 days  SYMPTOMS OF A STROKE   You have any symptoms of stroke. "BE FAST" is an easy way to remember the main warning signs: ? B - Balance. Signs are dizziness, sudden trouble walking, or loss of balance. ? E - Eyes. Signs are trouble seeing or a sudden change in how you see. ? F - Face. Signs are sudden weakness or loss of feeling of the face, or the face or eyelid drooping on one side. ? A - Arms. Signs are weakness or loss of feeling in an arm. This happens suddenly and usually on one side of the body. ? S - Speech. Signs are sudden trouble speaking, slurred speech, or trouble understanding what people say. ? T -  Time. Time to call emergency services. Write down what time symptoms started.  You have other signs of stroke, such as: ? A sudden, very bad headache with no known cause. ? Feeling sick to your stomach (nausea). ? Throwing up (vomiting). ? Jerky movements you cannot control (seizure).  SYMPTOMS OF A HEART ATTACK  What are the signs or symptoms? Symptoms of this condition include:  Chest pain. It may feel like: ? Crushing or squeezing. ? Tightness, pressure, fullness, or  heaviness.  Pain in the arm, neck, jaw, back, or upper body.  Shortness of breath.  Heartburn.  Indigestion.  Nausea.  Cold sweats.  Feeling tired.  Sudden lightheadedness.

## 2018-09-02 ENCOUNTER — Telehealth: Payer: Self-pay

## 2018-09-07 ENCOUNTER — Telehealth: Payer: Self-pay

## 2018-09-07 ENCOUNTER — Ambulatory Visit: Payer: Self-pay | Admitting: Pharmacist

## 2018-09-07 DIAGNOSIS — I10 Essential (primary) hypertension: Secondary | ICD-10-CM

## 2018-09-07 NOTE — Chronic Care Management (AMB) (Signed)
  Chronic Care Management   Note  09/07/2018 Name: Catherine Burgess MRN: [id number] DOB: 05/03/1935  83 y.o. year old female referred to Chronic Care Management cliical pharmacy services by [facility] Trish Fountain for medication management. Outreach today was for 30 day pharmacy follow up.    Was unable to reach patient via telephone today and have left HIPAA compliant voicemail asking patient to return my call. (unsuccessful outreach #1).  Follow up plan: A HIPPA compliant phone message was left for the patient providing contact information and requesting a return call.  The care management team will reach out to the patient again over the next 5-7 days.   Ruben Reason, PharmD Clinical Pharmacist [facility] [phone]

## 2018-09-14 ENCOUNTER — Telehealth: Payer: Self-pay

## 2018-09-14 ENCOUNTER — Ambulatory Visit: Payer: Self-pay | Admitting: Pharmacist

## 2018-09-14 DIAGNOSIS — Z8673 Personal history of transient ischemic attack (TIA), and cerebral infarction without residual deficits: Secondary | ICD-10-CM

## 2018-09-14 DIAGNOSIS — I10 Essential (primary) hypertension: Secondary | ICD-10-CM

## 2018-09-14 NOTE — Chronic Care Management (AMB) (Signed)
  Chronic Care Management   Note  09/14/2018 Name: Catherine Burgess MRN: [id number] DOB: [birth year]  83 y.o. year old female referred to Chronic Care Management cliical pharmacy services by [facility] Trish Fountain for medication management. Outreach today was for 30 day pharmacy follow up.    Was unable to reach patient via telephone today and have left HIPAA compliant voicemail asking patient to return my call. (unsuccessful outreach #2).  Follow up plan: A HIPPA compliant phone message was left for the patient providing contact information and requesting a return call.  The care management team will reach out to the patient again over the next 5-7 days.   Ruben Reason, PharmD Clinical Pharmacist [facility] [phone]

## 2018-09-16 ENCOUNTER — Telehealth: Payer: Self-pay

## 2018-09-16 ENCOUNTER — Ambulatory Visit: Payer: Self-pay | Admitting: Pharmacist

## 2018-09-16 NOTE — Chronic Care Management (AMB) (Signed)
  Chronic Care Management   Note  09/16/2018 Name: Catherine Burgess MRN: [id number] DOB: 1935/02/28  83 y.o. year old female referred to Chronic Care Management team for medication managment by Dr. Steele Sizer   CCM team services are being closed due to three unsuccessful outreach attempts.   Patient has been provided CCM contact information if he/she wishes to engage with care managers in the future.    Ruben Reason, PharmD Clinical Pharmacist [facility] [phone]

## 2018-09-22 DIAGNOSIS — M25562 Pain in left knee: Secondary | ICD-10-CM | POA: Diagnosis not present

## 2018-09-22 DIAGNOSIS — M62830 Muscle spasm of back: Secondary | ICD-10-CM | POA: Diagnosis not present

## 2018-09-26 ENCOUNTER — Ambulatory Visit: Payer: Self-pay

## 2018-09-26 ENCOUNTER — Telehealth: Payer: Self-pay

## 2018-09-26 DIAGNOSIS — I10 Essential (primary) hypertension: Secondary | ICD-10-CM

## 2018-09-26 NOTE — Chronic Care Management (AMB) (Signed)
   Chronic Care Management   Unsuccessful Call Note 09/26/2018 Name: Catherine Burgess MRN: [id number] DOB: [birth year]   [name] a 83 y.o.year old femalewho is a primary care patient of Steele Sizer, MD. [name] was referred to CCM services by her health plan. [name] has a history of but not limited to HTN, Calcified cerebral meningioma, HLD, and CVA.    Was unable to reach patient via telephone today for monthly follow up. I have left HIPAA compliant voicemail asking patient to return my call. (unsuccessful outreach #1).   Plan: Will follow-up within 7 business days via telephone.      Bradie Sangiovanni E. Rollene Rotunda, RN, BSN Nurse Care Coordinator [facility] Care Management  [phone]

## 2018-09-28 ENCOUNTER — Ambulatory Visit (INDEPENDENT_AMBULATORY_CARE_PROVIDER_SITE_OTHER): Payer: Medicare Other

## 2018-09-28 ENCOUNTER — Other Ambulatory Visit: Payer: Self-pay

## 2018-09-28 DIAGNOSIS — I10 Essential (primary) hypertension: Secondary | ICD-10-CM | POA: Diagnosis not present

## 2018-09-28 NOTE — Chronic Care Management (AMB) (Signed)
  Chronic Care Management   Follow Up Note   09/28/2018 Name: Catherine Burgess MRN: [id number] DOB: December 12, 1935  Referred by: Steele Sizer, MD Reason for referral : Chronic Care Management (follow up HTN)   Subjective: "My blood pressure is real good"   Objective:  BP Readings from Last 3 Encounters:  08/05/18 138/64  05/30/18 (!) 146/74  05/17/18 133/73    Assessment: Catherine P Jeffriesis a 83 y.o.year old femalewho is a primary care patient of Steele Sizer, MD. Catherine Burgess was referred to CCM services by her health plan. Catherine Burgess has a history of but not limited to HTN, Calcified cerebral meningioma, HLD, and CVA.  Review of patient status, including review of consultants reports, relevant laboratory and other test results, and collaboration with appropriate care team members and the patient's provider was performed as part of comprehensive patient evaluation and provision of chronic care management services.    Goals Addressed            This Visit's Progress   . I don't really check my blood pressure as often as I should (pt-stated)       Catherine Burgess continues to do well managing her blood pressure. She continues to take her medications as prescribed and follow a low sodium diet. She is checking her BP several times a week and reports 120s-130s/60s-70s. She continues to be active and is able to run her own errands. She continues to follow Covid-19 infection prevention strategies.     Current Barriers:  [name] Knowledge Deficits related to basic understanding of hypertension pathophysiology and self care management  Nurse Case Manager Clinical Goal(s):  [name] Over the next 30 days, patient will continue to adhere to hypertension self care management plan of care-goal met 09/28/2018 . Over the next 90 days, patient will continue to adhere to hypertension self care management plan of care including checking and recording BP 3 x week, taking medications as prescribed,  exercising as tolerated, and adhering to a low sodium diet  Interventions:  . Assessed for compliance with HTN self care management plan of care . Reviewed home BP log . Reviewed dietary choices and discussed importance of low sodium diet . Assess for for daily exercise and encouraged patient to walk daily as weather permits . Discussed plans with patient for ongoing care management follow up and provided patient with direct contact information for care management team . Advised patient, providing education and rationale, to monitor blood pressure daily and record, calling PCP for findings outside established parameters.   Patient Self Care Activities:  . Self administers medications as prescribed . Attends all scheduled provider appointments . Calls provider office for new concerns, questions, or BP outside discussed parameters . Checks BP and records as discussed . Follows a low sodium diet/DASH diet  Please see past updates related to this goal by clicking on the "Past Updates" button in the selected goal          The care management team will reach out to the patient again over the next 90 days.  The patient has been provided with contact information for the care management team and has been advised to call with any health related questions or concerns.      Catherine Burgess E. Rollene Rotunda, RN, BSN Nurse Care Coordinator [facility] Care Management  [phone]

## 2018-09-29 DIAGNOSIS — Z961 Presence of intraocular lens: Secondary | ICD-10-CM | POA: Diagnosis not present

## 2018-09-29 NOTE — Patient Instructions (Signed)
Thank you allowing the Chronic Care Management Team to be a part of your care! It was a pleasure speaking with you today!  1. Continue to take ALL your medications as prescribed 2. Keep taking your BP several times a week and report numbers to Dr. [name] as discussed 3. Keep as active as possible each day. This will help maintain a healthy heart and blood pressure  CCM (Chronic Care Management) Team   Trish Fountain RN, BSN Nurse Care Coordinator  [phone]  Ruben Reason PharmD  Clinical Pharmacist  [phone]   [place], LCSW Clinical Social Worker [phone]  Goals Addressed            This Visit's Progress   . I don't really check my blood pressure as often as I should (pt-stated)       Current Barriers:  [name] Knowledge Deficits related to basic understanding of hypertension pathophysiology and self care management  Nurse Case Manager Clinical Goal(s):  [name] Over the next 30 days, patient will continue to adhere to hypertension self care management plan of care-goal met 09/28/2018 . Over the next 90 days, patient will continue to adhere to hypertension self care management plan of care including checking and recording BP 3 x week, taking medications as prescribed, exercising as tolerated, and adhering to a low sodium diet  Interventions:  . Assessed for compliance with HTN self care management plan of care . Reviewed home BP log . Reviewed dietary choices and discussed importance of low sodium diet . Assess for for daily exercise and encouraged patient to walk daily as weather permits . Discussed plans with patient for ongoing care management follow up and provided patient with direct contact information for care management team . Advised patient, providing education and rationale, to monitor blood pressure daily and record, calling PCP for findings outside established parameters.   Patient Self Care Activities:  . Self administers medications as  prescribed . Attends all scheduled provider appointments . Calls provider office for new concerns, questions, or BP outside discussed parameters . Checks BP and records as discussed . Follows a low sodium diet/DASH diet  Please see past updates related to this goal by clicking on the "Past Updates" button in the selected goal         The patient verbalized understanding of instructions provided today and declined a print copy of patient instruction materials.   The care management team will reach out to the patient again over the next 90 days.  The patient has been provided with contact information for the care management team and has been advised to call with any health related questions or concerns.   SYMPTOMS OF A STROKE   You have any symptoms of stroke. "BE FAST" is an easy way to remember the main warning signs: ? B - Balance. Signs are dizziness, sudden trouble walking, or loss of balance. ? E - Eyes. Signs are trouble seeing or a sudden change in how you see. ? F - Face. Signs are sudden weakness or loss of feeling of the face, or the face or eyelid drooping on one side. ? A - Arms. Signs are weakness or loss of feeling in an arm. This happens suddenly and usually on one side of the body. ? S - Speech. Signs are sudden trouble speaking, slurred speech, or trouble understanding what people say. ? T - Time. Time to call emergency services. Write down what time symptoms started.  You have other signs of  stroke, such as: ? A sudden, very bad headache with no known cause. ? Feeling sick to your stomach (nausea). ? Throwing up (vomiting). ? Jerky movements you cannot control (seizure).  SYMPTOMS OF A HEART ATTACK  What are the signs or symptoms? Symptoms of this condition include:  Chest pain. It may feel like: ? Crushing or squeezing. ? Tightness, pressure, fullness, or heaviness.  Pain in the arm, neck, jaw, back, or upper body.  Shortness of  breath.  Heartburn.  Indigestion.  Nausea.  Cold sweats.  Feeling tired.  Sudden lightheadedness.

## 2018-09-30 ENCOUNTER — Ambulatory Visit (INDEPENDENT_AMBULATORY_CARE_PROVIDER_SITE_OTHER): Payer: Medicare Other | Admitting: Family Medicine

## 2018-09-30 ENCOUNTER — Encounter: Payer: Self-pay | Admitting: Family Medicine

## 2018-09-30 ENCOUNTER — Other Ambulatory Visit: Payer: Self-pay

## 2018-09-30 VITALS — BP 140/70 | HR 76 | Temp 97.3°F | Resp 16 | Ht 63.0 in | Wt 149.7 lb

## 2018-09-30 DIAGNOSIS — E782 Mixed hyperlipidemia: Secondary | ICD-10-CM

## 2018-09-30 DIAGNOSIS — Z23 Encounter for immunization: Secondary | ICD-10-CM

## 2018-09-30 DIAGNOSIS — R739 Hyperglycemia, unspecified: Secondary | ICD-10-CM

## 2018-09-30 DIAGNOSIS — E559 Vitamin D deficiency, unspecified: Secondary | ICD-10-CM | POA: Diagnosis not present

## 2018-09-30 DIAGNOSIS — M170 Bilateral primary osteoarthritis of knee: Secondary | ICD-10-CM

## 2018-09-30 DIAGNOSIS — I10 Essential (primary) hypertension: Secondary | ICD-10-CM

## 2018-09-30 DIAGNOSIS — Z8673 Personal history of transient ischemic attack (TIA), and cerebral infarction without residual deficits: Secondary | ICD-10-CM

## 2018-09-30 DIAGNOSIS — R748 Abnormal levels of other serum enzymes: Secondary | ICD-10-CM

## 2018-09-30 DIAGNOSIS — K219 Gastro-esophageal reflux disease without esophagitis: Secondary | ICD-10-CM

## 2018-09-30 DIAGNOSIS — D320 Benign neoplasm of cerebral meninges: Secondary | ICD-10-CM

## 2018-09-30 DIAGNOSIS — M85851 Other specified disorders of bone density and structure, right thigh: Secondary | ICD-10-CM

## 2018-09-30 NOTE — Progress Notes (Addendum)
Name: Catherine Burgess   MRN: [id number]    DOB: 1936-01-13   Date:09/30/2018       Progress Note  Subjective  Chief Complaint  Chief Complaint  Patient presents with  . Hyperlipidemia  . Hypertension  . Gastroesophageal Reflux  . Medication Refill    HPI  Abnormal MRI brain from 2018 also abnormal CT brain from 06/2017. Last visit to [facility] for dizziness, and diagnosed with dehydration. CT showed calcified meningioma, discussed referral to neurologist but she would like to hold off for now, no symptoms. She has noticed some headaches, she is seeing Neurologist Dr. Manuella Ghazi every 6 months   Nasal congestion: using flonase prn and doing better at this time, no rhinorrhea . Doing well   GERD: doing well with pantoprazole, denies heart burn , regurgitation or blood in stools.. Denies change in bowel movements or appetite  , discussed long risk medication use and she will try to take it every other day   OA/ shoulder pain:/neck pain under the care of Ortho, taking gabapentin and tylenol, still uses a quad cane to help with ambulation. She was able to stop meloxicam. Recently had a flare of left side back pain, also left knee effusion and increase in pain, went to Urgent care and was given meloxicam and tizanidine and she will follow up with Ortho next week. Explained that tizanidine and baclofen are the same class of drugs , so she will go back to baclofen She is already off meloxicam   Hyperlipidemia: taking medication, denies side effects, labs reviewed, alkaline phosphatase was stable  HTN: bp is at goal today, she is tolerating medication well, bp at home around 130's, no chest pain or palpitation.  Osteopenia: discussed last test done in 2014, osteopenia right hip, we will place an order   Patient Active Problem List   Diagnosis Date Noted  . Abnormal ankle brachial index (ABI) 03/02/2018  . Calcified cerebral meningioma ([place]) 10/04/2017  . History of CVA (cerebrovascular accident)  without residual deficits 10/04/2017  . Subacromial impingement of right shoulder 10/04/2017  . Volvulus ([place])   . History of anemia 10/13/2016  . Syncope 07/23/2016  . Hypokalemia 07/23/2016  . Abnormal CT of brain 07/23/2016  . Vitamin D deficiency 10/28/2015  . Post menopausal syndrome 10/28/2015  . OP (osteoporosis) 10/28/2015  . Cervical disc disease 10/28/2015  . H/O total knee replacement 01/20/2015  . Vaginal atrophy 09/26/2014  . Osteoarthritis of both knees 07/31/2014  . GERD without esophagitis 07/31/2014  . HLD (hyperlipidemia) 07/31/2014  . Hypertension 07/31/2014    Past Surgical History:  Procedure Laterality Date  . CATARACT EXTRACTION, BILATERAL    . HIP PINNING Left 1956  . REPLACEMENT TOTAL KNEE Left 11/12/2009  . TOTAL KNEE REVISION Left 12/07/2016   Procedure: TOTAL KNEE REVISION;  Surgeon: Dereck Leep, MD;  Location: [facility] ORS;  Service: Orthopedics;  Laterality: Left;    Family History  Problem Relation Age of Onset  . Emphysema Father        Smoker  . CAD Mother   . Stroke Mother   . Diabetes Brother   . Seizures Brother   . Cancer Brother        unknown  . Diabetes Brother     Social History   Socioeconomic History  . Marital status: Divorced    Spouse name: Not on file  . Number of children: 1  . Years of education: Not on file  . Highest education level: 12th grade  Occupational History  . Occupation: Retired  Scientific laboratory technician  . Financial resource strain: Not hard at all  . Food insecurity    Worry: Never true    Inability: Never true  . Transportation needs    Medical: No    Non-medical: No  Tobacco Use  . Smoking status: Former Smoker    Packs/day: 0.25    Years: 1.00    Pack years: 0.25    Types: Cigarettes    Quit date: 1966    Years since quitting: 54.6  . Smokeless tobacco: Never Used  . Tobacco comment: smoking cessation materials not required  Substance and Sexual Activity  . Alcohol use: No    Alcohol/week: 0.0  standard drinks  . Drug use: No  . Sexual activity: Not Currently  Lifestyle  . Physical activity    Days per week: 0 days    Minutes per session: 0 min  . Stress: Not at all  Relationships  . Social Herbalist on phone: Patient refused    Gets together: Patient refused    Attends religious service: Patient refused    Active member of club or organization: Patient refused    Attends meetings of clubs or organizations: Patient refused    Relationship status: Divorced  . Intimate partner violence    Fear of current or ex partner: No    Emotionally abused: No    Physically abused: No    Forced sexual activity: No  Other Topics Concern  . Not on file  Social History Narrative  . Not on file     Current Outpatient Medications:  .  acetaminophen (TYLENOL) 500 MG tablet, Take 1 tablet (500 mg total) by mouth 2 (two) times daily. (Patient taking differently: Take 500 mg by mouth every 6 (six) hours as needed for mild pain. ), Disp: 180 tablet, Rfl: 0 .  amLODipine (NORVASC) 2.5 MG tablet, Take 1 tablet (2.5 mg total) by mouth daily., Disp: 90 tablet, Rfl: 1 .  aspirin EC 81 MG tablet, Take 81 mg by mouth., Disp: , Rfl:  .  atorvastatin (LIPITOR) 40 MG tablet, Take 1 tablet (40 mg total) by mouth daily., Disp: 90 tablet, Rfl: 1 .  baclofen (LIORESAL) 10 MG tablet, Take 1-2 tablets (10-20 mg total) by mouth 3 (three) times daily., Disp: 30 each, Rfl: 0 .  Calcium-Vitamin D 600-200 MG-UNIT tablet, Take 1 tablet by mouth daily. , Disp: , Rfl:  .  fluticasone (FLONASE) 50 MCG/ACT nasal spray, Place into the nose., Disp: , Rfl:  .  gabapentin (NEURONTIN) 300 MG capsule, TAKE ONE CAPSULE BY MOUTH TWICE DAILY (Patient taking differently: Take by mouth 2 (two) times daily. Take 300mg  in the morning and 600 mg at bedtime), Disp: 180 capsule, Rfl: 1 .  losartan (COZAAR) 50 MG tablet, Take 1 tablet (50 mg total) by mouth daily., Disp: 90 tablet, Rfl: 1 .  meloxicam (MOBIC) 15 MG tablet,  Take 1 tablet by mouth daily as needed., Disp: , Rfl:  .  pantoprazole (PROTONIX) 40 MG tablet, Take 1 tablet (40 mg total) by mouth daily., Disp: 90 tablet, Rfl: 1 .  polyethylene glycol (MIRALAX / GLYCOLAX) packet, Take 17 g by mouth daily., Disp: 100 each, Rfl: 0 .  tiZANidine (ZANAFLEX) 2 MG tablet, Take by mouth., Disp: , Rfl:  .  triamcinolone cream (KENALOG) 0.1 %, APP EXT AA TWICE DAILY. DO NOT U LONGER THAN 2 CONS WKS, Disp: , Rfl: 0 .  vitamin B-12 (  CYANOCOBALAMIN) 1000 MCG tablet, Take 1,000 mcg by mouth daily., Disp: , Rfl:   Allergies  Allergen Reactions  . Lovastatin Other (See Comments)    chest  . Pravastatin     Other reaction(s): Muscle Pain chest  . Rosuvastatin     Other reaction(s): Muscle Pain chest Other reaction(s): Muscle Pain chest  . Statins     Other reaction(s): Muscle Pain chest    I personally reviewed active problem list, medication list, allergies, family history, social history, health maintenance with the patient/caregiver today.   ROS  Constitutional: Negative for fever or weight change.  Respiratory: Negative for cough and shortness of breath.   Cardiovascular: Negative for chest pain or palpitations.  Gastrointestinal: Negative for abdominal pain, no bowel changes.  Musculoskeletal: Positive for gait problem and left knee  joint swelling.  Skin: Negative for rash.  Neurological: Negative for dizziness or headache.  No other specific complaints in a complete review of systems (except as listed in HPI above).  Objective  Vitals:   09/30/18 1003  BP: 140/70  Pulse: 76  Resp: 16  Temp: (!) 97.3 F (36.3 C)  TempSrc: Temporal  SpO2: 100%  Weight: 149 lb 11.2 oz (67.9 kg)  Height: 5\' 3"  (1.6 m)    Body mass index is 26.52 kg/m.  Physical Exam  Constitutional: Patient appears well-developed and well-nourished.  No distress.  HEENT: head atraumatic, normocephalic, pupils equal and reactive to light,  neck supple Cardiovascular:  Normal rate, regular rhythm and normal heart sounds.  No murmur heard. No BLE edema. Pulmonary/Chest: Effort normal and breath sounds normal. No respiratory distress. Abdominal: Soft.  There is no tenderness. Psychiatric: Patient has a normal mood and affect. behavior is normal. Judgment and thought content normal. Muscular skeletal: mild effusion, scar from surgery, decrease in rom and using cane   PHQ2/9: Depression screen [facility] 2/9 09/30/2018 08/05/2018 07/18/2018 05/30/2018 05/17/2018  Decreased Interest 0 0 0 0 0  Down, Depressed, Hopeless 0 0 0 0 0  PHQ - 2 Score 0 0 0 0 0  Altered sleeping 0 0 - 0 0  Tired, decreased energy 0 0 - 0 0  Change in appetite 0 0 - 0 0  Feeling bad or failure about yourself  0 0 - 0 0  Trouble concentrating 0 0 - 0 0  Moving slowly or fidgety/restless 0 0 - 0 0  Suicidal thoughts 0 0 - 0 0  PHQ-9 Score 0 0 - 0 0  Difficult doing work/chores - Not difficult at all - - -  Some recent data might be hidden    phq 9 is negative   Fall Risk: Fall Risk  09/30/2018 08/05/2018 07/18/2018 05/30/2018 05/17/2018  Falls in the past year? 0 0 0 0 0  Number falls in past yr: 0 0 - 0 0  Injury with Fall? 0 0 - 0 0  Risk for fall due to : - - - - -  Risk for fall due to: Comment - - - - -  Follow up - - - - -    Functional Status Survey: Is the patient deaf or have difficulty hearing?: No Does the patient have difficulty seeing, even when wearing glasses/contacts?: No Does the patient have difficulty concentrating, remembering, or making decisions?: No Does the patient have difficulty walking or climbing stairs?: No Does the patient have difficulty dressing or bathing?: No Does the patient have difficulty doing errands alone such as visiting a doctor's office or shopping?: No  Assessment & Plan  1. Essential hypertension  - COMPLETE METABOLIC PANEL WITH GFR - CBC with Differential/Platelet  2. History of CVA (cerebrovascular accident) without residual  deficits  On aspirin and statin therapy   3. GERD without esophagitis  Doing well on pantoprazole and will try to wean self off slowly   4. Calcified cerebral meningioma ([facility])  Under the care of Dr. Manuella Ghazi   5. Vitamin D deficiency  Continue supplements otc   6. Elevated alkaline phosphatase level  Recheck level   7. Primary osteoarthritis of both knees   8. Osteopenia of right hip  - DG Bone Density; Future  9. Hyperglycemia  - Hemoglobin A1c  10. Mixed hyperlipidemia  - Lipid panel  11. Needs flu shot  - Flu Vaccine QUAD High Dose(Fluad)

## 2018-09-30 NOTE — Patient Instructions (Signed)
Try to take pantoprazole every other day and monitor for symptoms of heartburn/reflux

## 2018-10-01 LAB — CBC WITH DIFFERENTIAL/PLATELET
Absolute Monocytes: 615 cells/uL (ref 200–950)
Basophils Absolute: 53 cells/uL (ref 0–200)
Basophils Relative: 0.7 %
Eosinophils Absolute: 120 cells/uL (ref 15–500)
Eosinophils Relative: 1.6 %
HCT: 44.9 % (ref 35.0–45.0)
Hemoglobin: 14.3 g/dL (ref 11.7–15.5)
Lymphs Abs: 1860 cells/uL (ref 850–3900)
MCH: 26.8 pg — ABNORMAL LOW (ref 27.0–33.0)
MCHC: 31.8 g/dL — ABNORMAL LOW (ref 32.0–36.0)
MCV: 84.2 fL (ref 80.0–100.0)
MPV: 10.4 fL (ref 7.5–12.5)
Monocytes Relative: 8.2 %
Neutro Abs: 4853 cells/uL (ref 1500–7800)
Neutrophils Relative %: 64.7 %
Platelets: 357 10*3/uL (ref 140–400)
RBC: 5.33 10*6/uL — ABNORMAL HIGH (ref 3.80–5.10)
RDW: 14.1 % (ref 11.0–15.0)
Total Lymphocyte: 24.8 %
WBC: 7.5 10*3/uL (ref 3.8–10.8)

## 2018-10-01 LAB — COMPLETE METABOLIC PANEL WITH GFR
AG Ratio: 1.9 (calc) (ref 1.0–2.5)
ALT: 17 U/L (ref 6–29)
AST: 14 U/L (ref 10–35)
Albumin: 3.9 g/dL (ref 3.6–5.1)
Alkaline phosphatase (APISO): 129 U/L (ref 37–153)
BUN: 16 mg/dL (ref 7–25)
CO2: 29 mmol/L (ref 20–32)
Calcium: 9 mg/dL (ref 8.6–10.4)
Chloride: 108 mmol/L (ref 98–110)
Creat: 0.81 mg/dL (ref 0.60–0.88)
GFR, Est African American: 79 mL/min/{1.73_m2} (ref >=60)
GFR, Est Non African American: 69 mL/min/{1.73_m2} (ref >=60)
Globulin: 2.1 g/dL (calc) (ref 1.9–3.7)
Glucose, Bld: 94 mg/dL (ref 65–99)
Potassium: 4.3 mmol/L (ref 3.5–5.3)
Sodium: 145 mmol/L (ref 135–146)
Total Bilirubin: 0.8 mg/dL (ref 0.2–1.2)
Total Protein: 6 g/dL — ABNORMAL LOW (ref 6.1–8.1)

## 2018-10-01 LAB — HEMOGLOBIN A1C
Hgb A1c MFr Bld: 6.4 % of total Hgb — ABNORMAL HIGH (ref <5.7)
Mean Plasma Glucose: 137 (calc)
eAG (mmol/L): 7.6 (calc)

## 2018-10-01 LAB — LIPID PANEL
Cholesterol: 128 mg/dL (ref <200)
HDL: 50 mg/dL (ref >=50)
LDL Cholesterol (Calc): 59 mg/dL (calc)
Non-HDL Cholesterol (Calc): 78 mg/dL (calc) (ref <130)
Total CHOL/HDL Ratio: 2.6 (calc) (ref <5.0)
Triglycerides: 105 mg/dL (ref <150)

## 2018-10-02 ENCOUNTER — Encounter: Payer: Self-pay | Admitting: Family Medicine

## 2018-10-02 DIAGNOSIS — R7303 Prediabetes: Secondary | ICD-10-CM | POA: Insufficient documentation

## 2018-10-05 DIAGNOSIS — M25562 Pain in left knee: Secondary | ICD-10-CM | POA: Diagnosis not present

## 2018-10-05 DIAGNOSIS — M25552 Pain in left hip: Secondary | ICD-10-CM | POA: Diagnosis not present

## 2018-10-07 DIAGNOSIS — H26491 Other secondary cataract, right eye: Secondary | ICD-10-CM | POA: Diagnosis not present

## 2018-10-11 ENCOUNTER — Ambulatory Visit (INDEPENDENT_AMBULATORY_CARE_PROVIDER_SITE_OTHER): Payer: Medicare Other

## 2018-10-11 VITALS — BP 130/72 | HR 73 | Temp 97.3°F | Resp 16 | Ht 63.0 in | Wt 148.1 lb

## 2018-10-11 DIAGNOSIS — Z0000 Encounter for general adult medical examination without abnormal findings: Secondary | ICD-10-CM | POA: Diagnosis not present

## 2018-10-11 NOTE — Progress Notes (Signed)
Subjective:   Catherine Burgess is a 83 y.o. female who presents for Medicare Annual (Subsequent) preventive examination.  Review of Systems:   Cardiac Risk Factors include: advanced age (>54mn, >>16women);dyslipidemia;hypertension     Objective:     Vitals: BP 130/72 (BP Location: Right Arm, Patient Position: Sitting, Cuff Size: Normal)   Pulse 73   Temp (!) 97.3 F (36.3 C) (Temporal)   Resp 16   Ht 5' 3" (1.6 m)   Wt 148 lb 1.6 oz (67.2 kg)   SpO2 97%   BMI 26.23 kg/m   Body mass index is 26.23 kg/m.  Advanced Directives 10/11/2018 07/18/2018 10/24/2017 10/07/2017 01/24/2017 12/07/2016 12/07/2016  Does Patient Have a Medical Advance Directive? _0  No No  Type of AParamedicof AN[place]han[place]Living will Living will - [place] Valley[place] will [place]itanicLiving will - -  Does patient want to make changes to medical advance directive? - No - Patient declined - - No - Patient declined - -  Copy of [place] Chart? No - copy requested - - No - copy requested No - copy requested - -  Would patient like information on creating a medical advance directive? - - - - - No - Patient declined No - Patient declined    Tobacco Social History   Tobacco Use  Smoking Status Former Smoker  . Packs/day: 0.25  . Years: 1.00  . Pack years: 0.25  . Types: Cigarettes  . Quit date: 1966  . Years since quitting: 54.7  Smokeless Tobacco Never Used  Tobacco Comment   smoking cessation materials not required     Counseling given: Not Answered Comment: smoking cessation materials not required   Clinical Intake:  Pre-visit preparation completed: Yes  Pain : No/denies pain     BMI - recorded: 26.23 Nutritional Status: BMI 25 -29 Overweight Nutritional Risks: None Diabetes: No  How often do you need to have someone help you when you read instructions, pamphlets, or other written materials from  your doctor or pharmacy?: 1 - Never  Interpreter Needed?: No  Information entered by :: KClemetine MarkerLPN  Past Medical History:  Diagnosis Date  . Degenerative joint disease   . GERD (gastroesophageal reflux disease)   . Hyperlipidemia   . Hypertension   . Malignant essential hypertension 07/23/2016  . Osteoarthrosis   . Reflux esophagitis    Past Surgical History:  Procedure Laterality Date  . CATARACT EXTRACTION, BILATERAL    . HIP PINNING Left 1956  . REPLACEMENT TOTAL KNEE Left 11/12/2009  . TOTAL KNEE REVISION Left 12/07/2016   Procedure: TOTAL KNEE REVISION;  Surgeon: HDereck Leep MD;  Location: [facility] ORS;  Service: Orthopedics;  Laterality: Left;   Family History  Problem Relation Age of Onset  . Emphysema Father        Smoker  . CAD Mother   . Stroke Mother   . Diabetes Brother   . Seizures Brother   . Cancer Brother        unknown  . Diabetes Brother    Social History   Socioeconomic History  . Marital status: Divorced    Spouse name: Not on file  . Number of children: 1  . Years of education: Not on file  . Highest education level: 12th grade  Occupational History  . Occupation: Retired  SScientific laboratory technician . Financial resource strain: Not hard at all  . Food  insecurity    Worry: Never true    Inability: Never true  . Transportation needs    Medical: No    Non-medical: No  Tobacco Use  . Smoking status: Former Smoker    Packs/day: 0.25    Years: 1.00    Pack years: 0.25    Types: Cigarettes    Quit date: 1966    Years since quitting: 54.7  . Smokeless tobacco: Never Used  . Tobacco comment: smoking cessation materials not required  Substance and Sexual Activity  . Alcohol use: No    Alcohol/week: 0.0 standard drinks  . Drug use: No  . Sexual activity: Not Currently  Lifestyle  . Physical activity    Days per week: 0 days    Minutes per session: 0 min  . Stress: Not at all  Relationships  . Social connections    Talks on phone: More than  three times a week    Gets together: More than three times a week    Attends religious service: More than 4 times per year    Active member of club or organization: No    Attends meetings of clubs or organizations: Never    Relationship status: Divorced  Other Topics Concern  . Not on file  Social History Narrative  . Not on file    Outpatient Encounter Medications as of 10/11/2018  Medication Sig  . acetaminophen (TYLENOL) 500 MG tablet Take 1 tablet (500 mg total) by mouth 2 (two) times daily. (Patient taking differently: Take 500 mg by mouth every 6 (six) hours as needed for mild pain. )  . amLODipine (NORVASC) 2.5 MG tablet Take 1 tablet (2.5 mg total) by mouth daily.  [name] aspirin EC 81 MG tablet Take 81 mg by mouth.  [name] atorvastatin (LIPITOR) 40 MG tablet Take 1 tablet (40 mg total) by mouth daily.  . baclofen (LIORESAL) 10 MG tablet Take 1-2 tablets (10-20 mg total) by mouth 3 (three) times daily.  . Calcium-Vitamin D 600-200 MG-UNIT tablet Take 1 tablet by mouth daily.   [name] gabapentin (NEURONTIN) 300 MG capsule TAKE ONE CAPSULE BY MOUTH TWICE DAILY (Patient taking differently: Take by mouth 2 (two) times daily. Take 370m in the morning and 600 mg at bedtime)  . losartan (COZAAR) 50 MG tablet Take 1 tablet (50 mg total) by mouth daily.  . pantoprazole (PROTONIX) 40 MG tablet Take 1 tablet (40 mg total) by mouth daily.  . vitamin B-12 (CYANOCOBALAMIN) 1000 MCG tablet Take 1,000 mcg by mouth daily.  . fluticasone (FLONASE) 50 MCG/ACT nasal spray Place into the nose.  . [DISCONTINUED] polyethylene glycol (MIRALAX / GLYCOLAX) packet Take 17 g by mouth daily. (Patient not taking: Reported on 10/11/2018)  . [DISCONTINUED] triamcinolone cream (KENALOG) 0.1 % APP EXT AA TWICE DAILY. DO NOT U LONGER THAN 2 CONS WKS   No facility-administered encounter medications on file as of 10/11/2018.     Activities of Daily Living In your present state of health, do you have any difficulty performing the  following activities: 10/11/2018 09/30/2018  Hearing? N N  Comment declines hearing aids -  Vision? N N  Difficulty concentrating or making decisions? N N  Walking or climbing stairs? N N  Dressing or bathing? N N  Doing errands, shopping? N N  Preparing Food and eating ? N -  Using the Toilet? N -  In the past six months, have you accidently leaked urine? Y -  Comment wears pads for protection -  Do you have problems with loss of bowel control? N -  Managing your Medications? N -  Managing your Finances? N -  Housekeeping or managing your Housekeeping? N -  Some recent data might be hidden    Patient Care Team: Steele Sizer, MD as PCP - General (Family Medicine) Leandrew Koyanagi, MD as Consulting Physician (Ophthalmology) Yolonda Kida, MD as Consulting Physician (Cardiology) Hooten, Laurice Record, MD as Consulting Physician (Orthopedic Surgery) Rubie Maid, MD as Consulting Physician (Obstetrics and Gynecology) Lang Snow, NP as Nurse Practitioner (Neurology) [place] (Acute Care) Benedetto Goad, RN as Case Manager    Assessment:   This is a routine wellness examination for [place].  Exercise Activities and Dietary recommendations Current Exercise Habits: The patient does not participate in regular exercise at present, Exercise limited by: orthopedic condition(s)  Goals    . Blood pressure medicines (pt-stated)     Current Barriers:  [name] Knowledge Deficits related to medicines used to treat high blood pressure  Pharmacist Clinical Goal(s):  [name] Over the next 30 days, patient will demonstrate improved understanding of prescribed medications and rationale for usage as evidenced by patient report  Interventions: . Comprehensive medication review performed. [name] Collaboration with RN Care Manager re: blood pressure education . Education around the [place]  Patient Self Care Activities:  . Self administers medications as prescribed  . Check blood pressure at home and record blood pressures in notebook  Initial goal documentation     . I don't really check my blood pressure as often as I should (pt-stated)     Current Barriers:  [name] Knowledge Deficits related to basic understanding of hypertension pathophysiology and self care management  Nurse Case Manager Clinical Goal(s):  [name] Over the next 30 days, patient will continue to adhere to hypertension self care management plan of care-goal met 09/28/2018 . Over the next 90 days, patient will continue to adhere to hypertension self care management plan of care including checking and recording BP 3 x week, taking medications as prescribed, exercising as tolerated, and adhering to a low sodium diet  Interventions:  . Assessed for compliance with HTN self care management plan of care . Reviewed home BP log . Reviewed dietary choices and discussed importance of low sodium diet . Assess for for daily exercise and encouraged patient to walk daily as weather permits . Discussed plans with patient for ongoing care management follow up and provided patient with direct contact information for care management team . Advised patient, providing education and rationale, to monitor blood pressure daily and record, calling PCP for findings outside established parameters.   Patient Self Care Activities:  . Self administers medications as prescribed . Attends all scheduled provider appointments . Calls provider office for new concerns, questions, or BP outside discussed parameters . Checks BP and records as discussed . Follows a low sodium diet/DASH diet  Please see past updates related to this goal by clicking on the "Past Updates" button in the selected goal      . Increase water intake     Recommend increasing water intake to 4-5 glasses a day.    . Prevent falls     Recommend to remove any items from the home that may cause slips or trips.       Fall Risk Fall Risk  10/11/2018  09/30/2018 08/05/2018 07/18/2018 05/30/2018  Falls in the past year? 0 0 0 0 0  Number falls in past yr: 0 0 0 -  0  Injury with Fall? 0 0 0 - 0  Risk for fall due to : Impaired balance/gait - - - -  Risk for fall due to: Comment - - - - -  Follow up Falls prevention discussed - - - -   FALL RISK PREVENTION PERTAINING TO THE HOME:  Any stairs in or around the home? Yes  If so, do they handrails? Yes   Home free of loose throw rugs in walkways, pet beds, electrical cords, etc? Yes  Adequate lighting in your home to reduce risk of falls? Yes   ASSISTIVE DEVICES UTILIZED TO PREVENT FALLS:  Life alert? No  Use of a cane, walker or w/c? Yes  Grab bars in the bathroom? Yes  Shower chair or bench in shower? No  Elevated toilet seat or a handicapped toilet? Yes   DME ORDERS:  DME order needed?  No   TIMED UP AND GO:  Was the test performed? Yes .  Length of time to ambulate 10 feet: 8 sec.   GAIT:  Appearance of gait:  Gait slow, steady and with the use of an assistive device.   Education: Fall risk prevention has been discussed.  Intervention(s) required? No    Depression Screen PHQ 2/9 Scores 10/11/2018 09/30/2018 08/05/2018 07/18/2018  PHQ - 2 Score 0 0 0 0  PHQ- 9 Score - 0 0 -     Cognitive Function     6CIT Screen 10/11/2018 10/07/2017  What Year? 0 points 0 points  What month? 0 points 3 points  What time? 0 points 0 points  Count back from 20 0 points 0 points  Months in reverse 0 points 0 points  Repeat phrase 4 points 8 points  Total Score 4 11    Immunization History  Administered Date(s) Administered  . Influenza, High Dose Seasonal PF 10/13/2016, 01/20/2018  . Influenza-Unspecified 10/31/2014, 12/05/2015  . Pneumococcal Conjugate-13 04/26/2013  . Pneumococcal Polysaccharide-23 10/13/2016    Qualifies for Shingles Vaccine? Yes . Due for Shingrix. Education has been provided regarding the importance of this vaccine. Pt has been advised to call insurance company  to determine out of pocket expense. Advised may also receive vaccine at local pharmacy or Health Dept. Verbalized acceptance and understanding.  Tdap: Up to date  Flu Vaccine: Up to date  Pneumococcal Vaccine: Up to date   Screening Tests Health Maintenance  Topic Date Due  . INFLUENZA VACCINE  09/10/2018  . TETANUS/TDAP  08/24/2022  . DEXA SCAN  Completed  . PNA vac Low Risk Adult  Completed    Cancer Screenings:  Colorectal Screening: No longer required.   Mammogram: Completed 05/23/14. No longer required.    Bone Density: Completed 09/07/12. Results reflect  OSTEOPENIA.  Repeat every 2 years. Pt scheduled for 11/01/18.   Lung Cancer Screening: (Low Dose CT Chest recommended if Age 96-80 years, 30 pack-year currently smoking OR have quit w/in 15years.) does not qualify.   Additional Screening:  Hepatitis C Screening: no longer required.   Vision Screening: Recommended annual ophthalmology exams for early detection of glaucoma and other disorders of the eye. Is the patient up to date with their annual eye exam?  Yes  Who is the provider or what is the name of the office in which the pt attends annual eye exams? [place] Screening: Recommended annual dental exams for proper oral hygiene  Community Resource Referral:  CRR required this visit?  No      Plan:  I have personally reviewed and addressed the Medicare Annual Wellness questionnaire and have noted the following in the patient's chart:  A. Medical and social history B. Use of alcohol, tobacco or illicit drugs  C. Current medications and supplements D. Functional ability and status E.  Nutritional status F.  Physical activity G. Advance directives H. List of other physicians I.  Hospitalizations, surgeries, and ER visits in previous 12 months J.  [place] such as hearing and vision if needed, cognitive and depression L. Referrals and appointments   In addition, I have  reviewed and discussed with patient certain preventive protocols, quality metrics, and best practice recommendations. A written personalized care plan for preventive services as well as general preventive health recommendations were provided to patient.   Signed,  Clemetine Marker, LPN Nurse Health Advisor   Nurse Notes: pt doing well and appreciative of visit today. Per pt received flu shot 09/30/18. Need documentation.

## 2018-10-11 NOTE — Addendum Note (Signed)
Addended by: Steele Sizer F on: 10/11/2018 04:40 PM   Modules accepted: Orders

## 2018-10-11 NOTE — Patient Instructions (Signed)
Catherine Burgess , Thank you for taking time to come for your Medicare Wellness Visit. I appreciate your ongoing commitment to your health goals. Please review the following plan we discussed and let me know if I can assist you in the future.   Screening recommendations/referrals: Colonoscopy: no longer required Mammogram: no longer required Bone Density: done 06/08/12. Scheduled for 11/01/18 Recommended yearly ophthalmology/optometry visit for glaucoma screening and checkup Recommended yearly dental visit for hygiene and checkup  Vaccinations: Influenza vaccine: done 09/30/18 Pneumococcal vaccine: done 10/13/16 Tdap vaccine: done 08/23/12 Shingles vaccine: Shingrix discussed. Please contact your pharmacy for coverage information.   Advanced directives: Please bring a copy of your health care power of attorney and living will to the office at your convenience.  Conditions/risks identified: Recommend maintaining healthy blood pressure  Next appointment: Please follow up in one year for your Medicare Annual Wellness visit.     Preventive Care 81 Years and Older, Female Preventive care refers to lifestyle choices and visits with your health care provider that can promote health and wellness. What does preventive care include?  A yearly physical exam. This is also called an annual well check.  Dental exams once or twice a year.  Routine eye exams. Ask your health care provider how often you should have your eyes checked.  Personal lifestyle choices, including:  Daily care of your teeth and gums.  Regular physical activity.  Eating a healthy diet.  Avoiding tobacco and drug use.  Limiting alcohol use.  Practicing safe sex.  Taking low-dose aspirin every day.  Taking vitamin and mineral supplements as recommended by your health care provider. What happens during an annual well check? The services and screenings done by your health care provider during your annual well check will  depend on your age, overall health, lifestyle risk factors, and family history of disease. Counseling  Your health care provider may ask you questions about your:  Alcohol use.  Tobacco use.  Drug use.  Emotional well-being.  Home and relationship well-being.  Sexual activity.  Eating habits.  History of falls.  Memory and ability to understand (cognition).  Work and work Statistician.  Reproductive health. Screening  You may have the following tests or measurements:  Height, weight, and BMI.  Blood pressure.  Lipid and cholesterol levels. These may be checked every 5 years, or more frequently if you are over 74 years old.  Skin check.  Lung cancer screening. You may have this screening every year starting at age 41 if you have a 30-pack-year history of smoking and currently smoke or have quit within the past 15 years.  Fecal occult blood test (FOBT) of the stool. You may have this test every year starting at age 52.  Flexible sigmoidoscopy or colonoscopy. You may have a sigmoidoscopy every 5 years or a colonoscopy every 10 years starting at age 56.  Hepatitis C blood test.  Hepatitis B blood test.  Sexually transmitted disease (STD) testing.  Diabetes screening. This is done by checking your blood sugar (glucose) after you have not eaten for a while (fasting). You may have this done every 1-3 years.  Bone density scan. This is done to screen for osteoporosis. You may have this done starting at age 98.  Mammogram. This may be done every 1-2 years. Talk to your health care provider about how often you should have regular mammograms. Talk with your health care provider about your test results, treatment options, and if necessary, the need for more tests. Vaccines  Your health care provider may recommend certain vaccines, such as:  Influenza vaccine. This is recommended every year.  Tetanus, diphtheria, and acellular pertussis (Tdap, Td) vaccine. You may need a  Td booster every 10 years.  Zoster vaccine. You may need this after age 24.  Pneumococcal 13-valent conjugate (PCV13) vaccine. One dose is recommended after age 20.  Pneumococcal polysaccharide (PPSV23) vaccine. One dose is recommended after age 38. Talk to your health care provider about which screenings and vaccines you need and how often you need them. This information is not intended to replace advice given to you by your health care provider. Make sure you discuss any questions you have with your health care provider. Document Released: 02/22/2015 Document Revised: 10/16/2015 Document Reviewed: 11/27/2014 Elsevier Interactive Patient Education  2017 [place] Prevention in the Home Falls can cause injuries. They can happen to people of all ages. There are many things you can do to make your home safe and to help prevent falls. What can I do on the outside of my home?  Regularly fix the edges of walkways and driveways and fix any cracks.  Remove anything that might make you trip as you walk through a door, such as a raised step or threshold.  Trim any bushes or trees on the path to your home.  Use bright outdoor lighting.  Clear any walking paths of anything that might make someone trip, such as rocks or tools.  Regularly check to see if handrails are loose or broken. Make sure that both sides of any steps have handrails.  Any raised decks and porches should have guardrails on the edges.  Have any leaves, snow, or ice cleared regularly.  Use sand or salt on walking paths during winter.  Clean up any spills in your garage right away. This includes oil or grease spills. What can I do in the bathroom?  Use night lights.  Install grab bars by the toilet and in the tub and shower. Do not use towel bars as grab bars.  Use non-skid mats or decals in the tub or shower.  If you need to sit down in the shower, use a plastic, non-slip stool.  Keep the floor dry. Clean  up any water that spills on the floor as soon as it happens.  Remove soap buildup in the tub or shower regularly.  Attach bath mats securely with double-sided non-slip rug tape.  Do not have throw rugs and other things on the floor that can make you trip. What can I do in the bedroom?  Use night lights.  Make sure that you have a light by your bed that is easy to reach.  Do not use any sheets or blankets that are too big for your bed. They should not hang down onto the floor.  Have a firm chair that has side arms. You can use this for support while you get dressed.  Do not have throw rugs and other things on the floor that can make you trip. What can I do in the kitchen?  Clean up any spills right away.  Avoid walking on wet floors.  Keep items that you use a lot in easy-to-reach places.  If you need to reach something above you, use a strong step stool that has a grab bar.  Keep electrical cords out of the way.  Do not use floor polish or wax that makes floors slippery. If you must use wax, use non-skid floor wax.  Do  not have throw rugs and other things on the floor that can make you trip. What can I do with my stairs?  Do not leave any items on the stairs.  Make sure that there are handrails on both sides of the stairs and use them. Fix handrails that are broken or loose. Make sure that handrails are as long as the stairways.  Check any carpeting to make sure that it is firmly attached to the stairs. Fix any carpet that is loose or worn.  Avoid having throw rugs at the top or bottom of the stairs. If you do have throw rugs, attach them to the floor with carpet tape.  Make sure that you have a light switch at the top of the stairs and the bottom of the stairs. If you do not have them, ask someone to add them for you. What else can I do to help prevent falls?  Wear shoes that:  Do not have high heels.  Have rubber bottoms.  Are comfortable and fit you well.  Are  closed at the toe. Do not wear sandals.  If you use a stepladder:  Make sure that it is fully opened. Do not climb a closed stepladder.  Make sure that both sides of the stepladder are locked into place.  Ask someone to hold it for you, if possible.  Clearly mark and make sure that you can see:  Any grab bars or handrails.  First and last steps.  Where the edge of each step is.  Use tools that help you move around (mobility aids) if they are needed. These include:  Canes.  Walkers.  Scooters.  Crutches.  Turn on the lights when you go into a dark area. Replace any light bulbs as soon as they burn out.  Set up your furniture so you have a clear path. Avoid moving your furniture around.  If any of your floors are uneven, fix them.  If there are any pets around you, be aware of where they are.  Review your medicines with your doctor. Some medicines can make you feel dizzy. This can increase your chance of falling. Ask your doctor what other things that you can do to help prevent falls. This information is not intended to replace advice given to you by your health care provider. Make sure you discuss any questions you have with your health care provider. Document Released: 11/22/2008 Document Revised: 07/04/2015 Document Reviewed: 03/02/2014 Elsevier Interactive Patient Education  2017 [employer].

## 2018-11-01 ENCOUNTER — Encounter: Payer: Self-pay | Admitting: Family Medicine

## 2018-11-01 ENCOUNTER — Ambulatory Visit
Admission: RE | Admit: 2018-11-01 | Discharge: 2018-11-01 | Disposition: A | Discharge status: Home / Self Care | Payer: Medicare Other | Source: Ambulatory Visit | Attending: Family Medicine | Admitting: Family Medicine

## 2018-11-01 DIAGNOSIS — Z780 Asymptomatic menopausal state: Secondary | ICD-10-CM | POA: Diagnosis not present

## 2018-11-01 DIAGNOSIS — M85851 Other specified disorders of bone density and structure, right thigh: Secondary | ICD-10-CM | POA: Diagnosis not present

## 2018-11-01 DIAGNOSIS — M8589 Other specified disorders of bone density and structure, multiple sites: Secondary | ICD-10-CM | POA: Diagnosis not present

## 2018-11-08 ENCOUNTER — Other Ambulatory Visit: Payer: Self-pay | Admitting: Family Medicine

## 2018-11-08 DIAGNOSIS — I10 Essential (primary) hypertension: Secondary | ICD-10-CM

## 2018-12-29 DIAGNOSIS — R200 Anesthesia of skin: Secondary | ICD-10-CM | POA: Diagnosis not present

## 2019-01-12 DIAGNOSIS — Z8673 Personal history of transient ischemic attack (TIA), and cerebral infarction without residual deficits: Secondary | ICD-10-CM | POA: Diagnosis not present

## 2019-01-12 DIAGNOSIS — K219 Gastro-esophageal reflux disease without esophagitis: Secondary | ICD-10-CM | POA: Diagnosis not present

## 2019-01-12 DIAGNOSIS — I159 Secondary hypertension, unspecified: Secondary | ICD-10-CM | POA: Diagnosis not present

## 2019-01-12 DIAGNOSIS — Z01818 Encounter for other preprocedural examination: Secondary | ICD-10-CM | POA: Diagnosis not present

## 2019-01-12 DIAGNOSIS — E785 Hyperlipidemia, unspecified: Secondary | ICD-10-CM | POA: Diagnosis not present

## 2019-01-26 ENCOUNTER — Other Ambulatory Visit: Payer: Self-pay | Admitting: Family Medicine

## 2019-01-26 DIAGNOSIS — E782 Mixed hyperlipidemia: Secondary | ICD-10-CM

## 2019-01-30 ENCOUNTER — Other Ambulatory Visit: Payer: Self-pay

## 2019-01-30 ENCOUNTER — Ambulatory Visit (INDEPENDENT_AMBULATORY_CARE_PROVIDER_SITE_OTHER): Payer: Medicare Other | Admitting: Family Medicine

## 2019-01-30 ENCOUNTER — Encounter: Payer: Self-pay | Admitting: Family Medicine

## 2019-01-30 VITALS — BP 144/78 | HR 76 | Temp 96.9°F | Resp 16 | Ht 63.0 in | Wt 150.5 lb

## 2019-01-30 DIAGNOSIS — E559 Vitamin D deficiency, unspecified: Secondary | ICD-10-CM

## 2019-01-30 DIAGNOSIS — M542 Cervicalgia: Secondary | ICD-10-CM

## 2019-01-30 DIAGNOSIS — I10 Essential (primary) hypertension: Secondary | ICD-10-CM

## 2019-01-30 DIAGNOSIS — G629 Polyneuropathy, unspecified: Secondary | ICD-10-CM

## 2019-01-30 DIAGNOSIS — M170 Bilateral primary osteoarthritis of knee: Secondary | ICD-10-CM | POA: Diagnosis not present

## 2019-01-30 DIAGNOSIS — K219 Gastro-esophageal reflux disease without esophagitis: Secondary | ICD-10-CM | POA: Diagnosis not present

## 2019-01-30 DIAGNOSIS — D320 Benign neoplasm of cerebral meninges: Secondary | ICD-10-CM | POA: Diagnosis not present

## 2019-01-30 DIAGNOSIS — M8589 Other specified disorders of bone density and structure, multiple sites: Secondary | ICD-10-CM

## 2019-01-30 MED ORDER — BACLOFEN 10 MG PO TABS: 10.0000 mg | ORAL_TABLET | Freq: Three times a day (TID) | ORAL | 0 refills | Status: DC

## 2019-01-30 NOTE — Progress Notes (Signed)
Name: Catherine Burgess   MRN: [id number]    DOB: 08-Oct-1935   Date:01/30/2019       Progress Note  Subjective  Chief Complaint  Chief Complaint  Patient presents with  . Hypertension  . Hyperlipidemia  . Gastroesophageal Reflux  . Medication Refill    She would like a refill on her Baclofen and has questions about a compounded medication for Pheripheral Neuropathy. Neurologist increased Gabapentin to 3 x daily needs refills, she did not send her refills.    HPI  Abnormal MRI brain from 2018 also abnormal CT brain from 06/2017. Last visit to [facility] for dizziness, and diagnosed with dehydration. CT showed calcified meningioma She has noticed some headaches, she is seeing Neurologist Dr. Manuella Ghazi every 6 months. No more episodes of syncope and is doing well  Neuropathy: seen by neurologist , advised to increase gabapentin 300 mg in am and 600 mg at night , she is also using a  compound medication but does not seem to improve symptoms. She also has pain on wrists and neurologist doesn't think carpal tunnel .    Last visit with neurologist was 12/2018.   GERD: doing well with pantoprazole, denies heart burn , regurgitation or blood in stools.. Denies change in bowel movements or appetite, she is now only taking it every other day and still working well for her   OA/ shoulder pain:/neck pain/left knee OAunder the care of Ortho, taking gabapentin and tylenol, still uses a quad cane to help with ambulation - only uses outside the house to help with balance. She was able to stop meloxicam.   Hyperlipidemia: taking medication, last labs done 09/2018. LDL down to 59   HTN:  she is tolerating medication well, bp at home around 130's, no chest pain or palpitation.BP here was a little high when she arrived, we will recheck before she goes home  Osteopenia: discussed last test done 10/2018 , osteopenia right hip and wrist. FRAX was 7.4% overall risk of fracture and 2.1 % of hip fracture. Continue  high calcium diet and vitamin D supplementation   Patient Active Problem List   Diagnosis Date Noted  . Pre-diabetes 10/02/2018  . Abnormal ankle brachial index (ABI) 03/02/2018  . Calcified cerebral meningioma ([place]) 10/04/2017  . History of CVA (cerebrovascular accident) without residual deficits 10/04/2017  . Subacromial impingement of right shoulder 10/04/2017  . Volvulus ([place])   . History of anemia 10/13/2016  . Syncope 07/23/2016  . Hypokalemia 07/23/2016  . Abnormal CT of brain 07/23/2016  . Vitamin D deficiency 10/28/2015  . Post menopausal syndrome 10/28/2015  . Osteopenia after menopause 10/28/2015  . Cervical disc disease 10/28/2015  . H/O total knee replacement 01/20/2015  . Vaginal atrophy 09/26/2014  . Osteoarthritis of both knees 07/31/2014  . GERD without esophagitis 07/31/2014  . HLD (hyperlipidemia) 07/31/2014  . Hypertension 07/31/2014    Past Surgical History:  Procedure Laterality Date  . CATARACT EXTRACTION, BILATERAL    . HIP PINNING Left 1956  . REPLACEMENT TOTAL KNEE Left 11/12/2009  . TOTAL KNEE REVISION Left 12/07/2016   Procedure: TOTAL KNEE REVISION;  Surgeon: Dereck Leep, MD;  Location: [facility] ORS;  Service: Orthopedics;  Laterality: Left;    Family History  Problem Relation Age of Onset  . Emphysema Father        Smoker  . CAD Mother   . Stroke Mother   . Diabetes Brother   . Seizures Brother   . Cancer Brother  unknown  . Diabetes Brother     Social History   Socioeconomic History  . Marital status: Divorced    Spouse name: Not on file  . Number of children: 1  . Years of education: Not on file  . Highest education level: 12th grade  Occupational History  . Occupation: Retired  Tobacco Use  . Smoking status: Former Smoker    Packs/day: 0.25    Years: 1.00    Pack years: 0.25    Types: Cigarettes    Quit date: 1966    Years since quitting: 55.0  . Smokeless tobacco: Never Used  . Tobacco comment: smoking  cessation materials not required  Substance and Sexual Activity  . Alcohol use: No    Alcohol/week: 0.0 standard drinks  . Drug use: No  . Sexual activity: Not Currently  Other Topics Concern  . Not on file  Social History Narrative  . Not on file   Social Determinants of Health   Financial Resource Strain:   . Difficulty of Paying Living Expenses: Not on file  Food Insecurity:   . Worried About Charity fundraiser in the Last Year: Not on file  . Ran Out of Food in the Last Year: Not on file  Transportation Needs:   . Lack of Transportation (Medical): Not on file  . Lack of Transportation (Non-Medical): Not on file  Physical Activity:   . Days of Exercise per Week: Not on file  . Minutes of Exercise per Session: Not on file  Stress:   . Feeling of Stress : Not on file  Social Connections: Unknown  . Frequency of Communication with Friends and Family: More than three times a week  . Frequency of Social Gatherings with Friends and Family: More than three times a week  . Attends Religious Services: More than 4 times per year  . Active Member of Clubs or Organizations: No  . Attends Archivist Meetings: Never  . Marital Status: Not on file  Intimate Partner Violence:   . Fear of Current or Ex-Partner: Not on file  . Emotionally Abused: Not on file  . Physically Abused: Not on file  . Sexually Abused: Not on file     Current Outpatient Medications:  .  acetaminophen (TYLENOL) 500 MG tablet, Take 1 tablet (500 mg total) by mouth 2 (two) times daily. (Patient taking differently: Take 500 mg by mouth every 6 (six) hours as needed for mild pain. ), Disp: 180 tablet, Rfl: 0 .  amLODipine (NORVASC) 2.5 MG tablet, TAKE 1 TABLET(2.5 MG) BY MOUTH DAILY, Disp: 90 tablet, Rfl: 1 .  aspirin EC 81 MG tablet, Take 81 mg by mouth., Disp: , Rfl:  .  atorvastatin (LIPITOR) 40 MG tablet, TAKE 1 TABLET(40 MG) BY MOUTH DAILY, Disp: 90 tablet, Rfl: 1 .  baclofen (LIORESAL) 10 MG  tablet, Take 1-2 tablets (10-20 mg total) by mouth 3 (three) times daily., Disp: 30 each, Rfl: 0 .  Calcium-Vitamin D 600-200 MG-UNIT tablet, Take 1 tablet by mouth daily. , Disp: , Rfl:  .  fluticasone (FLONASE) 50 MCG/ACT nasal spray, Place into the nose., Disp: , Rfl:  .  gabapentin (NEURONTIN) 300 MG capsule, TAKE ONE CAPSULE BY MOUTH TWICE DAILY (Patient taking differently: Take by mouth 2 (two) times daily. Take 300mg  in the morning and 600 mg at bedtime), Disp: 180 capsule, Rfl: 1 .  losartan (COZAAR) 50 MG tablet, TAKE 1 TABLET(50 MG) BY MOUTH DAILY, Disp: 90 tablet, Rfl: 1 .  pantoprazole (PROTONIX) 40 MG tablet, Take 1 tablet (40 mg total) by mouth daily., Disp: 90 tablet, Rfl: 1 .  vitamin B-12 (CYANOCOBALAMIN) 1000 MCG tablet, Take 1,000 mcg by mouth daily., Disp: , Rfl:   Allergies  Allergen Reactions  . Lovastatin Other (See Comments)    chest  . Pravastatin     Other reaction(s): Muscle Pain chest  . Rosuvastatin     Other reaction(s): Muscle Pain chest Other reaction(s): Muscle Pain chest  . Statins     Other reaction(s): Muscle Pain chest    I personally reviewed active problem list, medication list, allergies, family history, social history, health maintenance with the patient/caregiver today.   ROS  Constitutional: Negative for fever or weight change.  Respiratory: Negative for cough and shortness of breath.   Cardiovascular: Negative for chest pain or palpitations.  Gastrointestinal: Negative for abdominal pain, no bowel changes.  Musculoskeletal: Positive  for gait problem and intermittent  joint swelling.  Skin: Negative for rash.  Neurological: Negative for dizziness or headache.  No other specific complaints in a complete review of systems (except as listed in HPI above).  Objective  Vitals:   01/30/19 0926  BP: (!) 144/78  Pulse: 76  Resp: 16  Temp: (!) 96.9 F (36.1 C)  TempSrc: Temporal  SpO2: 96%  Weight: 150 lb 8 oz (68.3 kg)  Height: 5'  3" (1.6 m)    Body mass index is 26.66 kg/m.  Physical Exam  Constitutional: Patient appears well-developed and well-nourished. Obese  No distress.  HEENT: head atraumatic, normocephalic, pupils equal and reactive to light Cardiovascular: Normal rate, regular rhythm and normal heart sounds.  No murmur heard. No BLE edema. Pulmonary/Chest: Effort normal and breath sounds normal. No respiratory distress. Abdominal: Soft.  There is no tenderness. Psychiatric: Patient has a normal mood and affect. behavior is normal. Judgment and thought content normal.    PHQ2/9: Depression screen [facility] 2/9 01/30/2019 10/11/2018 09/30/2018 08/05/2018 07/18/2018  Decreased Interest 0 0 0 0 0  Down, Depressed, Hopeless 0 0 0 0 0  PHQ - 2 Score 0 0 0 0 0  Altered sleeping 0 - 0 0 -  Tired, decreased energy 0 - 0 0 -  Change in appetite 0 - 0 0 -  Feeling bad or failure about yourself  0 - 0 0 -  Trouble concentrating 0 - 0 0 -  Moving slowly or fidgety/restless 0 - 0 0 -  Suicidal thoughts 0 - 0 0 -  PHQ-9 Score 0 - 0 0 -  Difficult doing work/chores - - - Not difficult at all -  Some recent data might be hidden    phq 9 is negative   Fall Risk: Fall Risk  10/11/2018 09/30/2018 08/05/2018 07/18/2018 05/30/2018  Falls in the past year? 0 0 0 0 0  Number falls in past yr: 0 0 0 - 0  Injury with Fall? 0 0 0 - 0  Risk for fall due to : Impaired balance/gait - - - -  Risk for fall due to: Comment - - - - -  Follow up Falls prevention discussed - - - -     Functional Status Survey: Is the patient deaf or have difficulty hearing?: No Does the patient have difficulty seeing, even when wearing glasses/contacts?: No Does the patient have difficulty concentrating, remembering, or making decisions?: No Does the patient have difficulty walking or climbing stairs?: Yes Does the patient have difficulty dressing or bathing?: No Does the patient  have difficulty doing errands alone such as visiting a doctor's office or  shopping?: No   Assessment & Plan  1. Essential hypertension  Continue medication  2. Vitamin D deficiency   3. Calcified cerebral meningioma ([place])  Keep follow up with  Neurologist   4. GERD without esophagitis   5. Primary osteoarthritis of both knees  Continue cane   6. Osteopenia of multiple sites   7. Peripheral polyneuropathy  Still has pain, taking gabapentin  8. Neck pain  - baclofen (LIORESAL) 10 MG tablet; Take 1-2 tablets (10-20 mg total) by mouth 3 (three) times daily.  Dispense: 30 each; Refill: 0

## 2019-03-04 DIAGNOSIS — U071 COVID-19: Secondary | ICD-10-CM | POA: Diagnosis not present

## 2019-03-04 DIAGNOSIS — J069 Acute upper respiratory infection, unspecified: Secondary | ICD-10-CM | POA: Diagnosis not present

## 2019-03-04 DIAGNOSIS — R05 Cough: Secondary | ICD-10-CM | POA: Diagnosis not present

## 2019-03-22 ENCOUNTER — Telehealth: Payer: Self-pay | Admitting: Family Medicine

## 2019-04-04 ENCOUNTER — Other Ambulatory Visit: Payer: Self-pay | Admitting: Family Medicine

## 2019-04-04 DIAGNOSIS — K219 Gastro-esophageal reflux disease without esophagitis: Secondary | ICD-10-CM
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
## 2019-04-27 DIAGNOSIS — L298 Other pruritus: Secondary | ICD-10-CM | POA: Diagnosis not present

## 2019-05-02 DIAGNOSIS — Z96652 Presence of left artificial knee joint: Secondary | ICD-10-CM | POA: Diagnosis not present

## 2019-05-02 DIAGNOSIS — T84038D Mechanical loosening of other internal prosthetic joint, subsequent encounter: Secondary | ICD-10-CM | POA: Diagnosis not present

## 2019-05-07 ENCOUNTER — Other Ambulatory Visit: Payer: Self-pay | Admitting: Family Medicine

## 2019-05-07 DIAGNOSIS — I10 Essential (primary) hypertension: Secondary | ICD-10-CM

## 2019-05-07 NOTE — Telephone Encounter (Signed)
Requested Prescriptions  Pending Prescriptions Disp Refills  . amLODipine (NORVASC) 2.5 MG tablet [Pharmacy Med Name: AMLODIPINE BESYLATE 2.5MG  TABLETS] 90 tablet 0    Sig: TAKE 1 TABLET(2.5 MG) BY MOUTH DAILY     Cardiovascular:  Calcium Channel Blockers Failed - 05/07/2019  8:17 AM      Failed - Last BP in normal range    BP Readings from Last 1 Encounters:  01/30/19 (!) 144/78         Passed - Valid encounter within last 6 months    Recent Outpatient Visits          3 months ago Essential hypertension   [facility] [place], [name], MD   7 months ago Essential hypertension   [facility] Steele Sizer, MD   9 months ago Tick bite of right lower leg, initial encounter   [place], NP   11 months ago Calcified cerebral meningioma [facility])   [facility], [name], MD   11 months ago Neck pain   [facility] [place], [name], MD      Future Appointments            In 3 weeks Steele Sizer, MD [facility], [place]   In 5 months  [facility]           . losartan (COZAAR) 50 MG tablet [Pharmacy Med Name: LOSARTAN 50MG  TABLETS] 90 tablet 0    Sig: TAKE 1 TABLET(50 MG) BY MOUTH DAILY     Cardiovascular:  Angiotensin Receptor Blockers Failed - 05/07/2019  8:17 AM      Failed - Cr in normal range and within 180 days    Creat  Date Value Ref Range Status  09/30/2018 0.81 0.60 - 0.88 mg/dL Final    Comment:    For patients >61 years of age, the reference limit for Creatinine is approximately 13% higher for people identified as African-American. .          Failed - K in normal range and within 180 days    Potassium  Date Value Ref Range Status  09/30/2018 4.3 3.5 - 5.3 mmol/L Final  01/22/2014 3.8 3.5 - 5.1 mmol/L Final         Failed - Last BP in normal range    BP Readings from Last 1 Encounters:   01/30/19 (!) 144/78         Passed - Patient is not pregnant      Passed - Valid encounter within last 6 months    Recent Outpatient Visits          3 months ago Essential hypertension   [facility] [place], [name], MD   7 months ago Essential hypertension   [facility] Steele Sizer, MD   9 months ago Tick bite of right lower leg, initial encounter   [place], NP   11 months ago Calcified cerebral meningioma [facility])   [facility] Steele Sizer, MD   11 months ago Neck pain   [facility] Steele Sizer, MD      Future Appointments            In 3 weeks [name], [name], MD [facility], [place]   In 5 months  [facility], [facility]

## 2019-05-31 ENCOUNTER — Encounter: Payer: Self-pay | Admitting: Family Medicine

## 2019-05-31 ENCOUNTER — Ambulatory Visit (INDEPENDENT_AMBULATORY_CARE_PROVIDER_SITE_OTHER): Payer: Medicare Other | Admitting: Family Medicine

## 2019-05-31 ENCOUNTER — Other Ambulatory Visit: Payer: Self-pay

## 2019-05-31 VITALS — BP 134/76 | HR 76 | Temp 97.1°F | Resp 16 | Ht 63.0 in | Wt 149.7 lb

## 2019-05-31 DIAGNOSIS — I10 Essential (primary) hypertension: Secondary | ICD-10-CM

## 2019-05-31 DIAGNOSIS — R21 Rash and other nonspecific skin eruption: Secondary | ICD-10-CM

## 2019-05-31 DIAGNOSIS — R739 Hyperglycemia, unspecified: Secondary | ICD-10-CM

## 2019-05-31 DIAGNOSIS — K219 Gastro-esophageal reflux disease without esophagitis: Secondary | ICD-10-CM

## 2019-05-31 DIAGNOSIS — M170 Bilateral primary osteoarthritis of knee: Secondary | ICD-10-CM

## 2019-05-31 DIAGNOSIS — E782 Mixed hyperlipidemia: Secondary | ICD-10-CM

## 2019-05-31 DIAGNOSIS — E559 Vitamin D deficiency, unspecified: Secondary | ICD-10-CM | POA: Diagnosis not present

## 2019-05-31 DIAGNOSIS — M85851 Other specified disorders of bone density and structure, right thigh: Secondary | ICD-10-CM

## 2019-05-31 DIAGNOSIS — M8589 Other specified disorders of bone density and structure, multiple sites: Secondary | ICD-10-CM

## 2019-05-31 DIAGNOSIS — Z8673 Personal history of transient ischemic attack (TIA), and cerebral infarction without residual deficits: Secondary | ICD-10-CM

## 2019-05-31 DIAGNOSIS — G629 Polyneuropathy, unspecified: Secondary | ICD-10-CM

## 2019-05-31 MED ORDER — LORATADINE 10 MG PO TABS: 10.0000 mg | ORAL_TABLET | Freq: Every day | ORAL | 0 refills | Status: DC

## 2019-05-31 MED ORDER — AMLODIPINE BESYLATE 2.5 MG PO TABS: 2.5000 mg | ORAL_TABLET | Freq: Every day | ORAL | 0 refills | Status: DC

## 2019-05-31 MED ORDER — ATORVASTATIN CALCIUM 40 MG PO TABS: 40.0000 mg | ORAL_TABLET | Freq: Every day | ORAL | 0 refills | Status: DC

## 2019-05-31 MED ORDER — LOSARTAN POTASSIUM 50 MG PO TABS: 50.0000 mg | ORAL_TABLET | Freq: Every day | ORAL | 0 refills | Status: DC

## 2019-05-31 NOTE — Progress Notes (Signed)
Name: Catherine Burgess   MRN: [id number]    DOB: [birth year]   Date:05/31/2019       Progress Note  Subjective  Chief Complaint  Chief Complaint  Patient presents with  . Hypertension  . Hyperlipidemia  . Gastroesophageal Reflux  . Prediabetes  . Rash    She has ad rash on her face, arm and leg x 1 month. She went to [facility] and was prescribed a cream Cerave and referred to Dermatology.    HPI  Rash: going on for a couple of months, on arms, legs and face now, itchy and stinging. She went to Urgent and was given Nizoral and not sure if it is helping. She has an appointment with Dermatologist next week at [facility], advised to avoid benadryl and take loratadine instead of itching  Abnormal MRI brain from 2018 also abnormal CT brain from 06/2017. Last visit to [facility] for dizziness, and diagnosed with dehydration. CT showed calcified meningioma, discussed referral to neurologist but she would like to hold off for now, no symptoms. She has occasional headaches and sees Dr. Manuella Ghazi once a year now   AR: she states not having any problems at this time, she uses nasal spray prn only   GERD: doing well with pantoprazole, denies heart burn , regurgitation or blood in stools.. Denies change in bowel movements or appetite, she is now only taking medication every other day and symptoms are still controlled   OA/ shoulder pain:/neck painunder the care of Ortho, taking gabapentin and tylenol, still uses a quad cane to help with ambulation. She was able to stop meloxicam. Recently had a flare of left side back pain, also left knee effusion and increase in pain, went to Urgent care and was given meloxicam and tizanidine and she will follow up with Ortho next week. She takes baclofen prn only, rom of right shoulder is better.   Hyperlipidemia: taking medication, denies myalgia, no chest pain   HTN: bp is at goal today, she is tolerating medication well, bp at home around 130's, no chest pain or  palpitation.  Osteopenia: discussed last test done in 2014, osteopenia right hip, we will place an order   Neuropathy: using compound medication and it has helped, no weakness  History of CVA/ no sequela, she takes aspirin and cholesterol medication  Patient Active Problem List   Diagnosis Date Noted  . Pre-diabetes 10/02/2018  . Abnormal ankle brachial index (ABI) 03/02/2018  . Calcified cerebral meningioma ([place]) 10/04/2017  . History of CVA (cerebrovascular accident) without residual deficits 10/04/2017  . Subacromial impingement of right shoulder 10/04/2017  . Volvulus ([place])   . History of anemia 10/13/2016  . Syncope 07/23/2016  . Hypokalemia 07/23/2016  . Abnormal CT of brain 07/23/2016  . Vitamin D deficiency 10/28/2015  . Post menopausal syndrome 10/28/2015  . Osteopenia after menopause 10/28/2015  . Cervical disc disease 10/28/2015  . H/O total knee replacement 01/20/2015  . Vaginal atrophy 09/26/2014  . Osteoarthritis of both knees 07/31/2014  . GERD without esophagitis 07/31/2014  . HLD (hyperlipidemia) 07/31/2014  . Hypertension 07/31/2014    Past Surgical History:  Procedure Laterality Date  . CATARACT EXTRACTION, BILATERAL    . HIP PINNING Left 1956  . REPLACEMENT TOTAL KNEE Left 11/12/2009  . TOTAL KNEE REVISION Left 12/07/2016   Procedure: TOTAL KNEE REVISION;  Surgeon: Dereck Leep, MD;  Location: [facility] ORS;  Service: Orthopedics;  Laterality: Left;    Family History  Problem Relation Age of Onset  .  Emphysema Father        Smoker  . CAD Mother   . Stroke Mother   . Diabetes Brother   . Seizures Brother   . Cancer Brother        unknown  . Diabetes Brother     Social History   Tobacco Use  . Smoking status: Former Smoker    Packs/day: 0.25    Years: 1.00    Pack years: 0.25    Types: Cigarettes    Quit date: 1966    Years since quitting: 55.3  . Smokeless tobacco: Never Used  . Tobacco comment: smoking cessation materials not  required  Substance Use Topics  . Alcohol use: No    Alcohol/week: 0.0 standard drinks     Current Outpatient Medications:  .  acetaminophen (TYLENOL) 500 MG tablet, Take 1 tablet (500 mg total) by mouth 2 (two) times daily. (Patient taking differently: Take 500 mg by mouth every 6 (six) hours as needed for mild pain. ), Disp: 180 tablet, Rfl: 0 .  amLODipine (NORVASC) 2.5 MG tablet, TAKE 1 TABLET(2.5 MG) BY MOUTH DAILY, Disp: 90 tablet, Rfl: 0 .  aspirin EC 81 MG tablet, Take 81 mg by mouth., Disp: , Rfl:  .  atorvastatin (LIPITOR) 40 MG tablet, TAKE 1 TABLET(40 MG) BY MOUTH DAILY, Disp: 90 tablet, Rfl: 1 .  baclofen (LIORESAL) 10 MG tablet, Take 1-2 tablets (10-20 mg total) by mouth 3 (three) times daily., Disp: 30 each, Rfl: 0 .  Calcium-Vitamin D 600-200 MG-UNIT tablet, Take 1 tablet by mouth daily. , Disp: , Rfl:  .  fluticasone (FLONASE) 50 MCG/ACT nasal spray, Place into the nose., Disp: , Rfl:  .  gabapentin (NEURONTIN) 300 MG capsule, TAKE ONE CAPSULE BY MOUTH TWICE DAILY (Patient taking differently: Take by mouth 2 (two) times daily. Take 300mg  in the morning and 600 mg at bedtime), Disp: 180 capsule, Rfl: 1 .  losartan (COZAAR) 50 MG tablet, TAKE 1 TABLET(50 MG) BY MOUTH DAILY, Disp: 90 tablet, Rfl: 0 .  pantoprazole (PROTONIX) 40 MG tablet, TAKE 1 TABLET(40 MG) BY MOUTH DAILY, Disp: 90 tablet, Rfl: 1 .  vitamin B-12 (CYANOCOBALAMIN) 1000 MCG tablet, Take 1,000 mcg by mouth daily., Disp: , Rfl:   Allergies  Allergen Reactions  . Lovastatin Other (See Comments)    chest  . Pravastatin     Other reaction(s): Muscle Pain chest  . Rosuvastatin     Other reaction(s): Muscle Pain chest Other reaction(s): Muscle Pain chest  . Statins     Other reaction(s): Muscle Pain chest    I personally reviewed active problem list, medication list, allergies, family history, social history, health maintenance with the patient/caregiver today.   ROS  Constitutional: Negative for fever  or weight change.  Respiratory: Negative for cough and shortness of breath.   Cardiovascular: Negative for chest pain or palpitations.  Gastrointestinal: Negative for abdominal pain, no bowel changes.  Musculoskeletal: Negative for gait problem or joint swelling.  Skin: positive  for rash.  Neurological: Negative for dizziness, poistive for intermittent headache.  No other specific complaints in a complete review of systems (except as listed in HPI above).   Objective  Vitals:   05/31/19 1041 05/31/19 1048  BP: (!) 144/66 134/76  Pulse: 76   Resp: 16   Temp: (!) 97.1 F (36.2 C)   TempSrc: Temporal   SpO2: 98%   Weight: 149 lb 11.2 oz (67.9 kg)   Height: 5\' 3"  (1.6 m)  Body mass index is 26.52 kg/m.  Physical Exam  Constitutional: Patient appears well-developed and well-nourished. Overweight. No distress.  HEENT: head atraumatic, normocephalic, pupils equal and reactive to light Cardiovascular: Normal rate, regular rhythm and normal heart sounds.  No murmur heard. No BLE edema. Pulmonary/Chest: Effort normal and breath sounds normal. No respiratory distress. Abdominal: Soft.  There is no tenderness. Psychiatric: Patient has a normal mood and affect. behavior is normal. Judgment and thought content normal. Skin: well demarcated rash / round on right arm, on right leg is well demarcated raised but linear, also has some puffiness on her face  PHQ2/9: Depression screen [facility] 2/9 05/31/2019 01/30/2019 10/11/2018 09/30/2018 08/05/2018  Decreased Interest 0 0 0 0 0  Down, Depressed, Hopeless 0 0 0 0 0  PHQ - 2 Score 0 0 0 0 0  Altered sleeping 0 0 - 0 0  Tired, decreased energy 0 0 - 0 0  Change in appetite 0 0 - 0 0  Feeling bad or failure about yourself  0 0 - 0 0  Trouble concentrating 0 0 - 0 0  Moving slowly or fidgety/restless 0 0 - 0 0  Suicidal thoughts 0 0 - 0 0  PHQ-9 Score 0 0 - 0 0  Difficult doing work/chores - - - - Not difficult at all  Some recent data might  be hidden    phq 9 is negative   Fall Risk: Fall Risk  05/31/2019 10/11/2018 09/30/2018 08/05/2018 07/18/2018  Falls in the past year? 0 0 0 0 0  Number falls in past yr: 0 0 0 0 -  Injury with Fall? 0 0 0 0 -  Risk for fall due to : - Impaired balance/gait - - -  Risk for fall due to: Comment - - - - -  Follow up - Falls prevention discussed - - -    Functional Status Survey: Is the patient deaf or have difficulty hearing?: No Does the patient have difficulty seeing, even when wearing glasses/contacts?: No Does the patient have difficulty concentrating, remembering, or making decisions?: No Does the patient have difficulty walking or climbing stairs?: No Does the patient have difficulty dressing or bathing?: No Does the patient have difficulty doing errands alone such as visiting a doctor's office or shopping?: No   Assessment & Plan  1. Rash in adult  - loratadine (CLARITIN) 10 MG tablet; Take 1 tablet (10 mg total) by mouth daily.  Dispense: 30 tablet; Refill: 0  2. Essential hypertension  - losartan (COZAAR) 50 MG tablet; Take 1 tablet (50 mg total) by mouth daily.  Dispense: 90 tablet; Refill: 0 - amLODipine (NORVASC) 2.5 MG tablet; Take 1 tablet (2.5 mg total) by mouth daily.  Dispense: 90 tablet; Refill: 0  3. Vitamin D deficiency  Continue supplementation   4. Osteopenia of multiple sites   5. Primary osteoarthritis of both knees   6. GERD without esophagitis  Doing well on every other day   7. Peripheral polyneuropathy  Using compound medication and doing well   8. History of CVA (cerebrovascular accident) without residual deficits   9. Hyperglycemia  Discussed pre-diabetes and low sugar diet  10. Osteopenia of right hip   11. Mixed hyperlipidemia  - atorvastatin (LIPITOR) 40 MG tablet; Take 1 tablet (40 mg total) by mouth daily.  Dispense: 90 tablet; Refill: 0

## 2019-06-08 DIAGNOSIS — R21 Rash and other nonspecific skin eruption: Secondary | ICD-10-CM | POA: Diagnosis not present

## 2019-06-08 DIAGNOSIS — L299 Pruritus, unspecified: Secondary | ICD-10-CM | POA: Diagnosis not present

## 2019-07-13 DIAGNOSIS — L817 Pigmented purpuric dermatosis: Secondary | ICD-10-CM | POA: Diagnosis not present

## 2019-07-14 ENCOUNTER — Other Ambulatory Visit: Payer: Self-pay | Admitting: Family Medicine

## 2019-07-14 DIAGNOSIS — I10 Essential (primary) hypertension: Secondary | ICD-10-CM

## 2019-07-14 MED ORDER — AMLODIPINE BESYLATE 2.5 MG PO TABS: 2.5000 mg | ORAL_TABLET | Freq: Every day | ORAL | 0 refills | Status: DC

## 2019-07-14 NOTE — Telephone Encounter (Signed)
Medication Refill - Medication: Amlodipine  Has the patient contacted their pharmacy? Yes.   (Agent: If no, request that the patient contact the pharmacy for the refill.) (Agent: If yes, when and what did the pharmacy advise?)  Preferred Pharmacy (with phone number or street name): [facility] on [address]: Please be advised that RX refills may take up to 3 business days. We ask that you follow-up with your pharmacy.

## 2019-07-30 DIAGNOSIS — R079 Chest pain, unspecified: Secondary | ICD-10-CM | POA: Diagnosis not present

## 2019-08-16 ENCOUNTER — Other Ambulatory Visit: Payer: Self-pay | Admitting: Family Medicine

## 2019-08-16 DIAGNOSIS — E782 Mixed hyperlipidemia: Secondary | ICD-10-CM

## 2019-08-16 MED ORDER — ATORVASTATIN CALCIUM 40 MG PO TABS: 40.0000 mg | ORAL_TABLET | Freq: Every day | ORAL | 0 refills | Status: DC

## 2019-08-16 NOTE — Telephone Encounter (Signed)
Patient calling to request a refill on atorvastatin (LIPITOR) 40 MG tablet [id number] - [address], [place] AT [facility] OF [address] [place] Phone:  [phone]  Fax:  [phone]

## 2019-08-18 ENCOUNTER — Other Ambulatory Visit: Payer: Self-pay | Admitting: Family Medicine

## 2019-08-18 DIAGNOSIS — I10 Essential (primary) hypertension: Secondary | ICD-10-CM

## 2019-09-29 NOTE — Progress Notes (Signed)
Name: Catherine Burgess   MRN: [id number]    DOB: [birth year]   Date:10/02/2019       Progress Note  Subjective  Chief Complaint  Chief Complaint  Patient presents with  . Follow-up  . Hypertension  . Ear Pain    both    HPI  Pigmented purpuric dermatosis:  Started end of 2020, seen by Dermatologist she is using topical medication, moving around, no longer on her leg, but has a spot on her face, discussed follow up  Abnormal MRI brain from 2018 also abnormal CT brain from 06/2017. Last visit to [facility] for dizziness, and diagnosed with dehydration. CT showed calcified meningioma, she denies headaches, she saw Dr. Manuella Ghazi but they just discussed neuropathy and is taking higher dose gabapentin, discussed reviewing CT during her next office visit   AR: she states not having any problems at this time, she uses nasal spray prn only   Ear fullness going on intermittently, discussed using nasal steroid more often, we will also lavage her ears since during exam she was found to have ear wax  GERD: doing well with pantoprazole, denies heart burn , regurgitation or blood in stools.. Denies change in bowel movements or appetite, she is now only taking medication every other day and symptoms are still controlled , unchanged does not need refills today   OA/s/p left knee replacement: only has about 30 degree of flexion and has mild pain, aching, uses a cane to assist with gait, still sees ortho prn for other joint pains and stable.   Hyperlipidemia: taking medication, denies myalgia, no chest pain   HTN: bpis at goal today, she is tolerating medication well,  no chest pain, dizziness  or palpitation.  Osteopenia: reviewed last bone density 2020, continue high calcium diet , and take vitamin D 2000 units daily , also needs to continue being physically active  FRAX* RESULTS:  (version: 3.5) 10-year Probability of Fracture1 Major Osteoporotic Fracture2 Hip Fracture 7.4% 2.1% Population: [place]  (Black) Risk Factors: None  Neuropathy: using gabapetin 300 mg in am and 600 mg in the pm and is doing better, usually pain is on her legs, but wrists are doing better, she also uses compound cream   History of CVA/ no sequela, she takes aspirin and cholesterol medication, recheck labs    Patient Active Problem List   Diagnosis Date Noted  . Pre-diabetes 10/02/2018  . Abnormal ankle brachial index (ABI) 03/02/2018  . Calcified cerebral meningioma ([place]) 10/04/2017  . History of CVA (cerebrovascular accident) without residual deficits 10/04/2017  . Subacromial impingement of right shoulder 10/04/2017  . Volvulus ([place])   . History of anemia 10/13/2016  . Syncope 07/23/2016  . Hypokalemia 07/23/2016  . Abnormal CT of brain 07/23/2016  . Vitamin D deficiency 10/28/2015  . Post menopausal syndrome 10/28/2015  . Osteopenia after menopause 10/28/2015  . Cervical disc disease 10/28/2015  . H/O total knee replacement 01/20/2015  . Vaginal atrophy 09/26/2014  . Osteoarthritis of both knees 07/31/2014  . GERD without esophagitis 07/31/2014  . HLD (hyperlipidemia) 07/31/2014  . Hypertension 07/31/2014    Past Surgical History:  Procedure Laterality Date  . CATARACT EXTRACTION, BILATERAL    . HIP PINNING Left 1956  . REPLACEMENT TOTAL KNEE Left 11/12/2009  . TOTAL KNEE REVISION Left 12/07/2016   Procedure: TOTAL KNEE REVISION;  Surgeon: Dereck Leep, MD;  Location: [facility] ORS;  Service: Orthopedics;  Laterality: Left;    Family History  Problem Relation Age of Onset  .  Emphysema Father        Smoker  . CAD Mother   . Stroke Mother   . Diabetes Brother   . Seizures Brother   . Cancer Brother        unknown  . Diabetes Brother     Social History   Tobacco Use  . Smoking status: Former Smoker    Packs/day: 0.25    Years: 1.00    Pack years: 0.25    Types: Cigarettes    Quit date: 1966    Years since quitting: 55.6  . Smokeless tobacco: Never Used  . Tobacco  comment: smoking cessation materials not required  Substance Use Topics  . Alcohol use: No    Alcohol/week: 0.0 standard drinks     Current Outpatient Medications:  .  acetaminophen (TYLENOL) 500 MG tablet, Take 1 tablet (500 mg total) by mouth 2 (two) times daily. (Patient taking differently: Take 500 mg by mouth every 6 (six) hours as needed for mild pain. ), Disp: 180 tablet, Rfl: 0 .  amLODipine (NORVASC) 2.5 MG tablet, Take 1 tablet (2.5 mg total) by mouth daily., Disp: 90 tablet, Rfl: 0 .  aspirin EC 81 MG tablet, Take 81 mg by mouth., Disp: , Rfl:  .  atorvastatin (LIPITOR) 40 MG tablet, Take 1 tablet (40 mg total) by mouth daily., Disp: 90 tablet, Rfl: 0 .  Calcium-Vitamin D 600-200 MG-UNIT tablet, Take 1 tablet by mouth daily. , Disp: , Rfl:  .  fluticasone (FLONASE) 50 MCG/ACT nasal spray, Place into the nose., Disp: , Rfl:  .  gabapentin (NEURONTIN) 300 MG capsule, TAKE ONE CAPSULE BY MOUTH TWICE DAILY (Patient taking differently: Take by mouth 2 (two) times daily. Take 300mg  in the morning and 600 mg at bedtime), Disp: 180 capsule, Rfl: 1 .  loratadine (CLARITIN) 10 MG tablet, Take 1 tablet (10 mg total) by mouth daily., Disp: 30 tablet, Rfl: 0 .  losartan (COZAAR) 50 MG tablet, TAKE 1 TABLET(50 MG) BY MOUTH DAILY, Disp: 90 tablet, Rfl: 0 .  pantoprazole (PROTONIX) 40 MG tablet, TAKE 1 TABLET(40 MG) BY MOUTH DAILY, Disp: 90 tablet, Rfl: 1 .  vitamin B-12 (CYANOCOBALAMIN) 1000 MCG tablet, Take 1,000 mcg by mouth daily., Disp: , Rfl:  .  baclofen (LIORESAL) 10 MG tablet, Take 1-2 tablets (10-20 mg total) by mouth 3 (three) times daily. (Patient not taking: Reported on 10/02/2019), Disp: 30 each, Rfl: 0  Allergies  Allergen Reactions  . Lovastatin Other (See Comments)    chest  . Pravastatin     Other reaction(s): Muscle Pain chest  . Rosuvastatin     Other reaction(s): Muscle Pain chest Other reaction(s): Muscle Pain chest  . Statins     Other reaction(s): Muscle  Pain chest    I personally reviewed active problem list, medication list, allergies, family history, social history, health maintenance with the patient/caregiver today.  Ten systems reviewed and is negative except as mentioned in HPI   Objective  Vitals:   10/02/19 1043  BP: 130/70  Pulse: 71  Resp: 14  Temp: 98.1 F (36.7 C)  TempSrc: Oral  SpO2: 100%  Weight: 143 lb 3.2 oz (65 kg)  Height: 5\' 3"  (1.6 m)    Body mass index is 25.37 kg/m.  Constitutional: Patient appears well-developed and well-nourished. Overweight. No distress.  HEENT: head atraumatic, normocephalic, pupils equal and reactive to light, neck supple, cerumen impaction both ear canals.  Cardiovascular: Normal rate, regular rhythm and normal heart sounds.  No  murmur heard. No BLE edema. Pulmonary/Chest: Effort normal and breath sounds normal. No respiratory distress. Abdominal: Soft.  There is no tenderness. Muscular skeletal: decrease rom of left knee, no effusion , using a cane Psychiatric: Patient has a normal mood and affect. behavior is normal. Judgment and thought content normal.    PHQ2/9: Depression screen [facility] 2/9 10/02/2019 05/31/2019 01/30/2019 10/11/2018 09/30/2018  Decreased Interest 0 0 0 0 0  Down, Depressed, Hopeless 0 0 0 0 0  PHQ - 2 Score 0 0 0 0 0  Altered sleeping 0 0 0 - 0  Tired, decreased energy 0 0 0 - 0  Change in appetite 0 0 0 - 0  Feeling bad or failure about yourself  0 0 0 - 0  Trouble concentrating 0 0 0 - 0  Moving slowly or fidgety/restless 0 0 0 - 0  Suicidal thoughts 0 0 0 - 0  PHQ-9 Score 0 0 0 - 0  Difficult doing work/chores - - - - -  Some recent data might be hidden    phq 9 is negative   Fall Risk: Fall Risk  10/02/2019 05/31/2019 10/11/2018 09/30/2018 08/05/2018  Falls in the past year? 0 0 0 0 0  Number falls in past yr: 0 0 0 0 0  Injury with Fall? 0 0 0 0 0  Risk for fall due to : - - Impaired balance/gait - -  Risk for fall due to: Comment - - - - -   Follow up - - Falls prevention discussed - -     Assessment & Plan  1. Essential hypertension  - amLODipine (NORVASC) 2.5 MG tablet; Take 1 tablet (2.5 mg total) by mouth daily.  Dispense: 90 tablet; Refill: 1 - losartan (COZAAR) 50 MG tablet; Take 1 tablet (50 mg total) by mouth daily.  Dispense: 90 tablet; Refill: 1 - CBC - Comprehensive metabolic panel  2. Mixed hyperlipidemia  - atorvastatin (LIPITOR) 40 MG tablet; Take 1 tablet (40 mg total) by mouth daily.  Dispense: 90 tablet; Refill: 0 - Lipid panel  3. Hyperglycemia  - Hemoglobin A1c  4. Peripheral polyneuropathy  Under the care of Dr. Manuella Ghazi on gabapentin   5. History of CVA (cerebrovascular accident) without residual deficits  Continue statin therapy , bp control and aspirin  6. GERD without esophagitis  Controlled   7. Vitamin D deficiency  Continue vitamin D supplementation   8. Osteopenia of multiple sites   9. Calcified cerebral meningioma ([place])  Needs to discuss it with Dr. Manuella Ghazi

## 2019-10-02 ENCOUNTER — Other Ambulatory Visit: Payer: Self-pay

## 2019-10-02 ENCOUNTER — Ambulatory Visit (INDEPENDENT_AMBULATORY_CARE_PROVIDER_SITE_OTHER): Payer: Medicare Other | Admitting: Family Medicine

## 2019-10-02 ENCOUNTER — Encounter: Payer: Self-pay | Admitting: Family Medicine

## 2019-10-02 VITALS — BP 130/70 | HR 71 | Temp 98.1°F | Resp 14 | Ht 63.0 in | Wt 143.2 lb

## 2019-10-02 DIAGNOSIS — R739 Hyperglycemia, unspecified: Secondary | ICD-10-CM

## 2019-10-02 DIAGNOSIS — E782 Mixed hyperlipidemia: Secondary | ICD-10-CM

## 2019-10-02 DIAGNOSIS — Z8673 Personal history of transient ischemic attack (TIA), and cerebral infarction without residual deficits: Secondary | ICD-10-CM

## 2019-10-02 DIAGNOSIS — M8589 Other specified disorders of bone density and structure, multiple sites: Secondary | ICD-10-CM

## 2019-10-02 DIAGNOSIS — K219 Gastro-esophageal reflux disease without esophagitis: Secondary | ICD-10-CM

## 2019-10-02 DIAGNOSIS — D320 Benign neoplasm of cerebral meninges: Secondary | ICD-10-CM

## 2019-10-02 DIAGNOSIS — I10 Essential (primary) hypertension: Secondary | ICD-10-CM

## 2019-10-02 DIAGNOSIS — E559 Vitamin D deficiency, unspecified: Secondary | ICD-10-CM

## 2019-10-02 DIAGNOSIS — G629 Polyneuropathy, unspecified: Secondary | ICD-10-CM

## 2019-10-02 MED ORDER — ATORVASTATIN CALCIUM 40 MG PO TABS: 40.0000 mg | ORAL_TABLET | Freq: Every day | ORAL | 0 refills | Status: DC

## 2019-10-02 MED ORDER — AMLODIPINE BESYLATE 2.5 MG PO TABS: 2.5000 mg | ORAL_TABLET | Freq: Every day | ORAL | 1 refills | Status: DC

## 2019-10-02 MED ORDER — LOSARTAN POTASSIUM 50 MG PO TABS: 50.0000 mg | ORAL_TABLET | Freq: Every day | ORAL | 1 refills | Status: DC

## 2019-10-02 NOTE — Patient Instructions (Signed)
Earwax Buildup, Adult The ears produce a substance called earwax that helps keep bacteria out of the ear and protects the skin in the ear canal. Occasionally, earwax can build up in the ear and cause discomfort or hearing loss. What increases the risk? This condition is more likely to develop in people who:  Are female.  Are elderly.  Naturally produce more earwax.  Clean their ears often with cotton swabs.  Use earplugs often.  Use in-ear headphones often.  Wear hearing aids.  Have narrow ear canals.  Have earwax that is overly thick or sticky.  Have eczema.  Are dehydrated.  Have excess hair in the ear canal. What are the signs or symptoms? Symptoms of this condition include:  Reduced or muffled hearing.  A feeling of fullness in the ear or feeling that the ear is plugged.  Fluid coming from the ear.  Ear pain.  Ear itch.  Ringing in the ear.  Coughing.  An obvious piece of earwax that can be seen inside the ear canal. How is this diagnosed? This condition may be diagnosed based on:  Your symptoms.  Your medical history.  An ear exam. During the exam, your health care provider will look into your ear with an instrument called an otoscope. You may have tests, including a hearing test. How is this treated? This condition may be treated by:  Using ear drops to soften the earwax.  Having the earwax removed by a health care provider. The health care provider may: ? Flush the ear with water. ? Use an instrument that has a loop on the end (curette). ? Use a suction device.  Surgery to remove the wax buildup. This may be done in severe cases. Follow these instructions at home:   Take over-the-counter and prescription medicines only as told by your health care provider.  Do not put any objects, including cotton swabs, into your ear. You can clean the opening of your ear canal with a washcloth or facial tissue.  Follow instructions from your health care  provider about cleaning your ears. Do not over-clean your ears.  Drink enough fluid to keep your urine clear or pale yellow. This will help to thin the earwax.  Keep all follow-up visits as told by your health care provider. If earwax builds up in your ears often or if you use hearing aids, consider seeing your health care provider for routine, preventive ear cleanings. Ask your health care provider how often you should schedule your cleanings.  If you have hearing aids, clean them according to instructions from the manufacturer and your health care provider. Contact a health care provider if:  You have ear pain.  You develop a fever.  You have blood, pus, or other fluid coming from your ear.  You have hearing loss.  You have ringing in your ears that does not go away.  Your symptoms do not improve with treatment.  You feel like the room is spinning (vertigo). Summary  Earwax can build up in the ear and cause discomfort or hearing loss.  The most common symptoms of this condition include reduced or muffled hearing and a feeling of fullness in the ear or feeling that the ear is plugged.  This condition may be diagnosed based on your symptoms, your medical history, and an ear exam.  This condition may be treated by using ear drops to soften the earwax or by having the earwax removed by a health care provider.  Do not put any   objects, including cotton swabs, into your ear. You can clean the opening of your ear canal with a washcloth or facial tissue. This information is not intended to replace advice given to you by your health care provider. Make sure you discuss any questions you have with your health care provider. Document Revised: 01/08/2017 Document Reviewed: 04/08/2016 Elsevier Patient Education  2020 [employer]. Hypertension, Adult Hypertension is another name for high blood pressure. High blood pressure forces your heart to work harder to pump blood. This can cause  problems over time. There are two numbers in a blood pressure reading. There is a top number (systolic) over a bottom number (diastolic). It is best to have a blood pressure that is below 120/80. Healthy choices can help lower your blood pressure, or you may need medicine to help lower it. What are the causes? The cause of this condition is not known. Some conditions may be related to high blood pressure. What increases the risk?  Smoking.  Having type 2 diabetes mellitus, high cholesterol, or both.  Not getting enough exercise or physical activity.  Being overweight.  Having too much fat, sugar, calories, or salt (sodium) in your diet.  Drinking too much alcohol.  Having long-term (chronic) kidney disease.  Having a family history of high blood pressure.  Age. Risk increases with age.  Race. You may be at higher risk if you are African American.  Gender. Men are at higher risk than women before age 37. After age 89, women are at higher risk than men.  Having obstructive sleep apnea.  Stress. What are the signs or symptoms?  High blood pressure may not cause symptoms. Very high blood pressure (hypertensive crisis) may cause: ? Headache. ? Feelings of worry or nervousness (anxiety). ? Shortness of breath. ? Nosebleed. ? A feeling of being sick to your stomach (nausea). ? Throwing up (vomiting). ? Changes in how you see. ? Very bad chest pain. ? Seizures. How is this treated?  This condition is treated by making healthy lifestyle changes, such as: ? Eating healthy foods. ? Exercising more. ? Drinking less alcohol.  Your health care provider may prescribe medicine if lifestyle changes are not enough to get your blood pressure under control, and if: ? Your top number is above 130. ? Your bottom number is above 80.  Your personal target blood pressure may vary. Follow these instructions at home: Eating and drinking   If told, follow the DASH eating plan. To  follow this plan: ? Fill one half of your plate at each meal with fruits and vegetables. ? Fill one fourth of your plate at each meal with whole grains. Whole grains include whole-wheat pasta, brown rice, and whole-grain bread. ? Eat or drink low-fat dairy products, such as skim milk or low-fat yogurt. ? Fill one fourth of your plate at each meal with low-fat (lean) proteins. Low-fat proteins include fish, chicken without skin, eggs, beans, and tofu. ? Avoid fatty meat, cured and processed meat, or chicken with skin. ? Avoid pre-made or processed food.  Eat less than 1,500 mg of salt each day.  Do not drink alcohol if: ? Your doctor tells you not to drink. ? You are pregnant, may be pregnant, or are planning to become pregnant.  If you drink alcohol: ? Limit how much you use to:  0-1 drink a day for women.  0-2 drinks a day for men. ? Be aware of how much alcohol is in your drink. In the [place]  one drink equals one 12 oz bottle of beer (355 mL), one 5 oz glass of wine (148 mL), or one 1 oz glass of hard liquor (44 mL). Lifestyle   Work with your doctor to stay at a healthy weight or to lose weight. Ask your doctor what the best weight is for you.  Get at least 30 minutes of exercise most days of the week. This may include walking, swimming, or biking.  Get at least 30 minutes of exercise that strengthens your muscles (resistance exercise) at least 3 days a week. This may include lifting weights or doing Pilates.  Do not use any products that contain nicotine or tobacco, such as cigarettes, e-cigarettes, and chewing tobacco. If you need help quitting, ask your doctor.  Check your blood pressure at home as told by your doctor.  Keep all follow-up visits as told by your doctor. This is important. Medicines  Take over-the-counter and prescription medicines only as told by your doctor. Follow directions carefully.  Do not skip doses of blood pressure medicine. The medicine does not  work as well if you skip doses. Skipping doses also puts you at risk for problems.  Ask your doctor about side effects or reactions to medicines that you should watch for. Contact a doctor if you:  Think you are having a reaction to the medicine you are taking.  Have headaches that keep coming back (recurring).  Feel dizzy.  Have swelling in your ankles.  Have trouble with your vision. Get help right away if you:  Get a very bad headache.  Start to feel mixed up (confused).  Feel weak or numb.  Feel faint.  Have very bad pain in your: ? Chest. ? Belly (abdomen).  Throw up more than once.  Have trouble breathing. Summary  Hypertension is another name for high blood pressure.  High blood pressure forces your heart to work harder to pump blood.  For most people, a normal blood pressure is less than 120/80.  Making healthy choices can help lower blood pressure. If your blood pressure does not get lower with healthy choices, you may need to take medicine. This information is not intended to replace advice given to you by your health care provider. Make sure you discuss any questions you have with your health care provider. Document Revised: 10/06/2017 Document Reviewed: 10/06/2017 Elsevier Patient Education  2020 [employer].

## 2019-10-03 LAB — CBC
HCT: 44.5 % (ref 35.0–45.0)
Hemoglobin: 14.5 g/dL (ref 11.7–15.5)
MCH: 29.1 pg (ref 27.0–33.0)
MCHC: 32.6 g/dL (ref 32.0–36.0)
MCV: 89.2 fL (ref 80.0–100.0)
MPV: 10.5 fL (ref 7.5–12.5)
Platelets: 306 10*3/uL (ref 140–400)
RBC: 4.99 10*6/uL (ref 3.80–5.10)
RDW: 13.2 % (ref 11.0–15.0)
WBC: 6.8 10*3/uL (ref 3.8–10.8)

## 2019-10-03 LAB — COMPREHENSIVE METABOLIC PANEL
AG Ratio: 1.6 (calc) (ref 1.0–2.5)
ALT: 17 U/L (ref 6–29)
AST: 19 U/L (ref 10–35)
Albumin: 3.9 g/dL (ref 3.6–5.1)
Alkaline phosphatase (APISO): 118 U/L (ref 37–153)
BUN: 12 mg/dL (ref 7–25)
CO2: 28 mmol/L (ref 20–32)
Calcium: 9.1 mg/dL (ref 8.6–10.4)
Chloride: 106 mmol/L (ref 98–110)
Creat: 0.74 mg/dL (ref 0.60–0.88)
Globulin: 2.4 g/dL (calc) (ref 1.9–3.7)
Glucose, Bld: 96 mg/dL (ref 65–99)
Potassium: 3.9 mmol/L (ref 3.5–5.3)
Sodium: 144 mmol/L (ref 135–146)
Total Bilirubin: 1 mg/dL (ref 0.2–1.2)
Total Protein: 6.3 g/dL (ref 6.1–8.1)

## 2019-10-17 ENCOUNTER — Other Ambulatory Visit: Payer: Self-pay

## 2019-10-17 ENCOUNTER — Ambulatory Visit (INDEPENDENT_AMBULATORY_CARE_PROVIDER_SITE_OTHER): Payer: Medicare Other

## 2019-10-17 VITALS — BP 120/70 | HR 82 | Temp 98.3°F | Resp 16 | Ht 63.0 in | Wt 143.9 lb

## 2019-10-17 DIAGNOSIS — Z23 Encounter for immunization: Secondary | ICD-10-CM

## 2019-10-17 DIAGNOSIS — Z0000 Encounter for general adult medical examination without abnormal findings: Secondary | ICD-10-CM | POA: Diagnosis not present

## 2019-10-17 NOTE — Progress Notes (Signed)
Subjective:   Catherine Burgess is a 84 y.o. female who presents for Medicare Annual (Subsequent) preventive examination.  Review of Systems     Cardiac Risk Factors include: advanced age (>56mn, >>85women);dyslipidemia;hypertension     Objective:    Today's Vitals   10/17/19 1049  BP: 120/70  Pulse: 82  Resp: 16  Temp: 98.3 F (36.8 C)  TempSrc: Oral  SpO2: 100%  Weight: 143 lb 14.4 oz (65.3 kg)  Height: _0  (1.6 m)   Body mass index is 25.49 kg/m.  Advanced Directives 10/17/2019 10/11/2018 07/18/2018 10/24/2017 10/07/2017 01/24/2017 12/07/2016  Does Patient Have a Medical Advance Directive? _1  Yes No  Type of [place] will Living will - [place] will -  Does patient want to make changes to medical advance directive? - - No - Patient declined - - No - Patient declined -  Copy of [place] Chart? No - copy requested No - copy requested - - No - copy requested No - copy requested -  Would patient like information on creating a medical advance directive? - - - - - - No - Patient declined    Current Medications (verified) Outpatient Encounter Medications as of 10/17/2019  Medication Sig  . acetaminophen (TYLENOL) 500 MG tablet Take 1 tablet (500 mg total) by mouth 2 (two) times daily. (Patient taking differently: Take 500 mg by mouth every 6 (six) hours as needed for mild pain. )  . amLODipine (NORVASC) 2.5 MG tablet Take 1 tablet (2.5 mg total) by mouth daily. (Patient taking differently: Take 2.5 mg by mouth daily. Patient taking one in am and 2 tablets in pm)  . aspirin EC 81 MG tablet Take 81 mg by mouth.  .Marland Kitchenatorvastatin (LIPITOR) 40 MG tablet Take 1 tablet (40 mg total) by mouth daily.  . baclofen (LIORESAL) 10 MG tablet Take 1-2 tablets (10-20 mg total) by mouth 3 (three) times daily.    . Calcium-Vitamin D 600-200 MG-UNIT tablet Take 1 tablet by mouth daily.   . fluticasone (FLONASE) 50 MCG/ACT nasal spray Place into the nose.  . gabapentin (NEURONTIN) 300 MG capsule TAKE ONE CAPSULE BY MOUTH TWICE DAILY (Patient taking differently: Take by mouth 2 (two) times daily. Take 3029min the morning and 600 mg at bedtime)  . loratadine (CLARITIN) 10 MG tablet Take 1 tablet (10 mg total) by mouth daily.  . Marland Kitchenosartan (COZAAR) 50 MG tablet Take 1 tablet (50 mg total) by mouth daily.  . pantoprazole (PROTONIX) 40 MG tablet TAKE 1 TABLET(40 MG) BY MOUTH DAILY  . vitamin B-12 (CYANOCOBALAMIN) 1000 MCG tablet Take 1,000 mcg by mouth daily.   No facility-administered encounter medications on file as of 10/17/2019.    Allergies (verified) Lovastatin, Pravastatin, Rosuvastatin, and Statins   History: Past Medical History:  Diagnosis Date  . Degenerative joint disease   . GERD (gastroesophageal reflux disease)   . Hyperlipidemia   . Hypertension   . Malignant essential hypertension 07/23/2016  . Osteoarthrosis   . Reflux esophagitis    Past Surgical History:  Procedure Laterality Date  . CATARACT EXTRACTION, BILATERAL    . HIP PINNING Left 1956  . REPLACEMENT TOTAL KNEE Left 11/12/2009  . TOTAL KNEE REVISION Left 12/07/2016   Procedure: TOTAL KNEE REVISION;  Surgeon: HoDereck LeepMD;  Location: [facility] ORS;  Service: Orthopedics;  Laterality:  Left;   Family History  Problem Relation Age of Onset  . Emphysema Father        Smoker  . CAD Mother   . Stroke Mother   . Diabetes Brother   . Seizures Brother   . Cancer Brother        unknown  . Diabetes Brother    Social History   Socioeconomic History  . Marital status: Divorced    Spouse name: Not on file  . Number of children: 1  . Years of education: Not on file  . Highest education level: 12th grade  Occupational History  . Occupation: Retired  Tobacco Use  . Smoking status: Former Smoker    Packs/day: 0.25     Years: 1.00    Pack years: 0.25    Types: Cigarettes    Quit date: 1966    Years since quitting: 55.7  . Smokeless tobacco: Never Used  . Tobacco comment: smoking cessation materials not required  Vaping Use  . Vaping Use: Never used  Substance and Sexual Activity  . Alcohol use: No    Alcohol/week: 0.0 standard drinks  . Drug use: No  . Sexual activity: Not Currently  Other Topics Concern  . Not on file  Social History Narrative   Pt lives alone.    Social Determinants of Health   Financial Resource Strain: Low Risk   . Difficulty of Paying Living Expenses: Not hard at all  Food Insecurity: No Food Insecurity  . Worried About Charity fundraiser in the Last Year: Never true  . Ran Out of Food in the Last Year: Never true  Transportation Needs: No Transportation Needs  . Lack of Transportation (Medical): No  . Lack of Transportation (Non-Medical): No  Physical Activity: Inactive  . Days of Exercise per Week: 0 days  . Minutes of Exercise per Session: 0 min  Stress: No Stress Concern Present  . Feeling of Stress : Not at all  Social Connections: Moderately Integrated  . Frequency of Communication with Friends and Family: More than three times a week  . Frequency of Social Gatherings with Friends and Family: More than three times a week  . Attends Religious Services: More than 4 times per year  . Active Member of Clubs or Organizations: Yes  . Attends Archivist Meetings: Never  . Marital Status: Divorced    Tobacco Counseling Counseling given: Not Answered Comment: smoking cessation materials not required   Clinical Intake:  Pre-visit preparation completed: Yes  Pain : No/denies pain     BMI - recorded: 25.49 Nutritional Status: BMI 25 -29 Overweight Nutritional Risks: None Diabetes: No  How often do you need to have someone help you when you read instructions, pamphlets, or other written materials from your doctor or pharmacy?: 1 -  Never    Interpreter Needed?: No  Information entered by :: Clemetine Marker LPN   Activities of Daily Living In your present state of health, do you have any difficulty performing the following activities: 10/17/2019 05/31/2019  Hearing? N N  Comment declines hearing aids -  Vision? Y N  Difficulty concentrating or making decisions? N N  Walking or climbing stairs? Y N  Dressing or bathing? N N  Doing errands, shopping? N N  Preparing Food and eating ? N -  Using the Toilet? N -  In the past six months, have you accidently leaked urine? N -  Do you have problems with loss of bowel control? N -  Managing your Medications? N -  Managing your Finances? N -  Housekeeping or managing your Housekeeping? N -  Some recent data might be hidden    Patient Care Team: Steele Sizer, MD as PCP - General (Family Medicine) Leandrew Koyanagi, MD as Consulting Physician (Ophthalmology) Yolonda Kida, MD as Consulting Physician (Cardiology) Marry Guan, Laurice Record, MD as Consulting Physician (Orthopedic Surgery) Rubie Maid, MD as Consulting Physician (Obstetrics and Gynecology) Lang Snow, NP as Nurse Practitioner (Neurology) [place] (Acute Care) Benedetto Goad, RN as Case Manager  Indicate any recent Medical Services you may have received from other than [facility] providers in the past year (date may be approximate).     Assessment:   This is a routine wellness examination for Catherine Burgess.  Hearing/Vision screen  Hearing Screening   _0  _1  _2  _3  _4  _5  _6  _7  _8   Right ear:           Left ear:           Comments: Pt denies hearing difficulty  Vision Screening Comments: [facility] vision screenings at [facility]. Wallace Going  Dietary issues and exercise activities discussed: Current Exercise Habits: The patient does not participate in regular exercise at present, Exercise limited by: orthopedic  condition(s);neurologic condition(s)  Goals    .  Blood pressure medicines (pt-stated)      Current Barriers:  [name] Knowledge Deficits related to medicines used to treat high blood pressure  Pharmacist Clinical Goal(s):  [name] Over the next 30 days, patient will demonstrate improved understanding of prescribed medications and rationale for usage as evidenced by patient report  Interventions: . Comprehensive medication review performed. [name] Collaboration with RN Care Manager re: blood pressure education . Education around the [place]  Patient Self Care Activities:  . Self administers medications as prescribed . Check blood pressure at home and record blood pressures in notebook  Initial goal documentation     .  I don't really check my blood pressure as often as I should (pt-stated)      Current Barriers:  [name] Knowledge Deficits related to basic understanding of hypertension pathophysiology and self care management  Nurse Case Manager Clinical Goal(s):  [name] Over the next 30 days, patient will continue to adhere to hypertension self care management plan of care-goal met 09/28/2018 . Over the next 90 days, patient will continue to adhere to hypertension self care management plan of care including checking and recording BP 3 x week, taking medications as prescribed, exercising as tolerated, and adhering to a low sodium diet  Interventions:  . Assessed for compliance with HTN self care management plan of care . Reviewed home BP log . Reviewed dietary choices and discussed importance of low sodium diet . Assess for for daily exercise and encouraged patient to walk daily as weather permits . Discussed plans with patient for ongoing care management follow up and provided patient with direct contact information for care management team . Advised patient, providing education and rationale, to monitor blood pressure daily and record, calling PCP for findings outside established parameters.   Patient  Self Care Activities:  . Self administers medications as prescribed . Attends all scheduled provider appointments . Calls provider office for new concerns, questions, or BP outside discussed parameters . Checks BP and records as discussed . Follows a low sodium diet/DASH diet  Please see past updates related to this goal by clicking on the "Past Updates" button in the selected goal      .  Increase water intake      Recommend increasing water intake to 4-5 glasses a day.    .  Prevent falls      Recommend to remove any items from the home that may cause slips or trips.      Depression Screen PHQ 2/9 Scores 10/17/2019 10/02/2019 05/31/2019 01/30/2019 10/11/2018 09/30/2018 08/05/2018  PHQ - 2 Score 0 0 0 0 0 0 0  PHQ- 9 Score - 0 0 0 - 0 0    Fall Risk Fall Risk  10/17/2019 10/02/2019 05/31/2019 10/11/2018 09/30/2018  Falls in the past year? 0 0 0 0 0  Number falls in past yr: 0 0 0 0 0  Injury with Fall? 0 0 0 0 0  Risk for fall due to : Impaired vision;Impaired balance/gait - - Impaired balance/gait -  Risk for fall due to: Comment - - - - -  Follow up Falls prevention discussed - - Falls prevention discussed -    Any stairs in or around the home? Yes  If so, are there any without handrails? No  Home free of loose throw rugs in walkways, pet beds, electrical cords, etc? Yes  Adequate lighting in your home to reduce risk of falls? Yes   ASSISTIVE DEVICES UTILIZED TO PREVENT FALLS:  Life alert? No  Use of a cane, walker or w/c? No  Grab bars in the bathroom? Yes  Shower chair or bench in shower? No  Elevated toilet seat or a handicapped toilet? Yes   TIMED UP AND GO:  Was the test performed? Yes .  Length of time to ambulate 10 feet: 7 sec.   Gait slow and steady without use of assistive device  Cognitive Function:     6CIT Screen 10/17/2019 10/11/2018 10/07/2017  What Year? 0 points 0 points 0 points  What month? 0 points 0 points 3 points  What time? 0 points 0 points 0 points   Count back from 20 0 points 0 points 0 points  Months in reverse 0 points 0 points 0 points  Repeat phrase 6 points 4 points 8 points  Total Score _0 Immunizations Immunization History  Administered Date(s) Administered  . Fluad Quad(high Dose 65+) 09/30/2018, 10/17/2019  . Influenza, High Dose Seasonal PF 10/13/2016, 01/20/2018  . Influenza-Unspecified 10/31/2014, 12/05/2015  . PFIZER SARS-COV-2 Vaccination 03/03/2019, 03/25/2019  . PPD Test 08/02/2019  . Pneumococcal Conjugate-13 04/26/2013  . Pneumococcal Polysaccharide-23 10/13/2016  . Tdap 08/23/2012   Qualifies for Shingles Vaccine: Yes  Zostavax completed: NO. Due for Shingrix. Education has been provided regarding the importance of this vaccine. Pt has been advised to call insurance company to determine out of pocket expense. Advised may also receive vaccine at local pharmacy or Health Dept. Verbalized acceptance and understanding.  Tdap: Up to date  Flu Vaccine: Due for Flu vaccine. Does the patient want to receive this vaccine today?  Yes .   Pneumococcal Vaccine: Up to date  Covid-19 Vaccine: Up to date  Screening Tests Health Maintenance  Topic Date Due  . TETANUS/TDAP  08/24/2022  . INFLUENZA VACCINE  Completed  . DEXA SCAN  Completed  . COVID-19 Vaccine  Completed  . PNA vac Low Risk Adult  Completed    Health Maintenance  There are no preventive care reminders to display for this patient.  Colorectal cancer screening: No longer required.    Mammogram status: No longer required.    Bone Density status: Completed 11/01/18. Results reflect: Bone  density results: OSTEOPENIA. Repeat every 2 years.  Lung Cancer Screening: (Low Dose CT Chest recommended if Age 7-80 years, 30 pack-year currently smoking OR have quit w/in 15years.) does not qualify.   Additional Screening:  Hepatitis C Screening: no longer required  Vision Screening: Recommended annual ophthalmology exams for early detection of  glaucoma and other disorders of the eye. Is the patient up to date with their annual eye exam?  Yes  Who is the provider or what is the name of the office in which the patient attends annual eye exams? [place] Screening: Recommended annual dental exams for proper oral hygiene  Community Resource Referral / Chronic Care Management: CRR required this visit?  No   CCM required this visit?  No      Plan:     I have personally reviewed and noted the following in the patient's chart:   . Medical and social history . Use of alcohol, tobacco or illicit drugs  . Current medications and supplements . Functional ability and status . Nutritional status . Physical activity . Advanced directives . List of other physicians . Hospitalizations, surgeries, and ER visits in previous 12 months . Vitals . Screenings to include cognitive, depression, and falls . Referrals and appointments  In addition, I have reviewed and discussed with patient certain preventive protocols, quality metrics, and best practice recommendations. A written personalized care plan for preventive services as well as general preventive health recommendations were provided to patient.     Clemetine Marker, LPN   08/14/4235   Nurse Notes: pt doing well and appreciative of visit today.

## 2019-10-17 NOTE — Patient Instructions (Addendum)
Catherine Burgess , Thank you for taking time to come for your Medicare Wellness Visit. I appreciate your ongoing commitment to your health goals. Please review the following plan we discussed and let me know if I can assist you in the future.   Screening recommendations/referrals: Colonoscopy: no longer required Mammogram: no longer required Bone Density: done 11/01/18 Recommended yearly ophthalmology/optometry visit for glaucoma screening and checkup Recommended yearly dental visit for hygiene and checkup  Vaccinations: Influenza vaccine: done today Pneumococcal vaccine: done 10/13/16 Tdap vaccine: done 08/23/12 Shingles vaccine: Shingrix discussed. Please contact your pharmacy for coverage information.  Covid-19: done 03/03/19 & 03/25/19  Advanced directives: Please bring a copy of your health care power of attorney and living will to the office at your convenience.  Conditions/risks identified: Recommend increasing physical activity as tolerated.   Next appointment: Follow up in one year for your annual wellness visit.    Preventive Care 69 Years and Older, Female Preventive care refers to lifestyle choices and visits with your health care provider that can promote health and wellness. What does preventive care include?  A yearly physical exam. This is also called an annual well check.  Dental exams once or twice a year.  Routine eye exams. Ask your health care provider how often you should have your eyes checked.  Personal lifestyle choices, including:  Daily care of your teeth and gums.  Regular physical activity.  Eating a healthy diet.  Avoiding tobacco and drug use.  Limiting alcohol use.  Practicing safe sex.  Taking low-dose aspirin every day.  Taking vitamin and mineral supplements as recommended by your health care provider. What happens during an annual well check? The services and screenings done by your health care provider during your annual well check will  depend on your age, overall health, lifestyle risk factors, and family history of disease. Counseling  Your health care provider may ask you questions about your:  Alcohol use.  Tobacco use.  Drug use.  Emotional well-being.  Home and relationship well-being.  Sexual activity.  Eating habits.  History of falls.  Memory and ability to understand (cognition).  Work and work Statistician.  Reproductive health. Screening  You may have the following tests or measurements:  Height, weight, and BMI.  Blood pressure.  Lipid and cholesterol levels. These may be checked every 5 years, or more frequently if you are over 13 years old.  Skin check.  Lung cancer screening. You may have this screening every year starting at age 79 if you have a 30-pack-year history of smoking and currently smoke or have quit within the past 15 years.  Fecal occult blood test (FOBT) of the stool. You may have this test every year starting at age 32.  Flexible sigmoidoscopy or colonoscopy. You may have a sigmoidoscopy every 5 years or a colonoscopy every 10 years starting at age 27.  Hepatitis C blood test.  Hepatitis B blood test.  Sexually transmitted disease (STD) testing.  Diabetes screening. This is done by checking your blood sugar (glucose) after you have not eaten for a while (fasting). You may have this done every 1-3 years.  Bone density scan. This is done to screen for osteoporosis. You may have this done starting at age 68.  Mammogram. This may be done every 1-2 years. Talk to your health care provider about how often you should have regular mammograms. Talk with your health care provider about your test results, treatment options, and if necessary, the need for more tests.  Vaccines  Your health care provider may recommend certain vaccines, such as:  Influenza vaccine. This is recommended every year.  Tetanus, diphtheria, and acellular pertussis (Tdap, Td) vaccine. You may need a  Td booster every 10 years.  Zoster vaccine. You may need this after age 39.  Pneumococcal 13-valent conjugate (PCV13) vaccine. One dose is recommended after age 33.  Pneumococcal polysaccharide (PPSV23) vaccine. One dose is recommended after age 1. Talk to your health care provider about which screenings and vaccines you need and how often you need them. This information is not intended to replace advice given to you by your health care provider. Make sure you discuss any questions you have with your health care provider. Document Released: 02/22/2015 Document Revised: 10/16/2015 Document Reviewed: 11/27/2014 Elsevier Interactive Patient Education  2017 [place] Prevention in the Home Falls can cause injuries. They can happen to people of all ages. There are many things you can do to make your home safe and to help prevent falls. What can I do on the outside of my home?  Regularly fix the edges of walkways and driveways and fix any cracks.  Remove anything that might make you trip as you walk through a door, such as a raised step or threshold.  Trim any bushes or trees on the path to your home.  Use bright outdoor lighting.  Clear any walking paths of anything that might make someone trip, such as rocks or tools.  Regularly check to see if handrails are loose or broken. Make sure that both sides of any steps have handrails.  Any raised decks and porches should have guardrails on the edges.  Have any leaves, snow, or ice cleared regularly.  Use sand or salt on walking paths during winter.  Clean up any spills in your garage right away. This includes oil or grease spills. What can I do in the bathroom?  Use night lights.  Install grab bars by the toilet and in the tub and shower. Do not use towel bars as grab bars.  Use non-skid mats or decals in the tub or shower.  If you need to sit down in the shower, use a plastic, non-slip stool.  Keep the floor dry. Clean  up any water that spills on the floor as soon as it happens.  Remove soap buildup in the tub or shower regularly.  Attach bath mats securely with double-sided non-slip rug tape.  Do not have throw rugs and other things on the floor that can make you trip. What can I do in the bedroom?  Use night lights.  Make sure that you have a light by your bed that is easy to reach.  Do not use any sheets or blankets that are too big for your bed. They should not hang down onto the floor.  Have a firm chair that has side arms. You can use this for support while you get dressed.  Do not have throw rugs and other things on the floor that can make you trip. What can I do in the kitchen?  Clean up any spills right away.  Avoid walking on wet floors.  Keep items that you use a lot in easy-to-reach places.  If you need to reach something above you, use a strong step stool that has a grab bar.  Keep electrical cords out of the way.  Do not use floor polish or wax that makes floors slippery. If you must use wax, use non-skid floor wax.  Do not have throw rugs and other things on the floor that can make you trip. What can I do with my stairs?  Do not leave any items on the stairs.  Make sure that there are handrails on both sides of the stairs and use them. Fix handrails that are broken or loose. Make sure that handrails are as long as the stairways.  Check any carpeting to make sure that it is firmly attached to the stairs. Fix any carpet that is loose or worn.  Avoid having throw rugs at the top or bottom of the stairs. If you do have throw rugs, attach them to the floor with carpet tape.  Make sure that you have a light switch at the top of the stairs and the bottom of the stairs. If you do not have them, ask someone to add them for you. What else can I do to help prevent falls?  Wear shoes that:  Do not have high heels.  Have rubber bottoms.  Are comfortable and fit you well.  Are  closed at the toe. Do not wear sandals.  If you use a stepladder:  Make sure that it is fully opened. Do not climb a closed stepladder.  Make sure that both sides of the stepladder are locked into place.  Ask someone to hold it for you, if possible.  Clearly mark and make sure that you can see:  Any grab bars or handrails.  First and last steps.  Where the edge of each step is.  Use tools that help you move around (mobility aids) if they are needed. These include:  Canes.  Walkers.  Scooters.  Crutches.  Turn on the lights when you go into a dark area. Replace any light bulbs as soon as they burn out.  Set up your furniture so you have a clear path. Avoid moving your furniture around.  If any of your floors are uneven, fix them.  If there are any pets around you, be aware of where they are.  Review your medicines with your doctor. Some medicines can make you feel dizzy. This can increase your chance of falling. Ask your doctor what other things that you can do to help prevent falls. This information is not intended to replace advice given to you by your health care provider. Make sure you discuss any questions you have with your health care provider. Document Released: 11/22/2008 Document Revised: 07/04/2015 Document Reviewed: 03/02/2014 Elsevier Interactive Patient Education  2017 [place].  Influenza (Flu) Vaccine (Inactivated or Recombinant): What You Need to Know 1. Why get vaccinated? Influenza vaccine can prevent influenza (flu). Flu is a contagious disease that spreads around the [place] every year, usually between October and May. Anyone can get the flu, but it is more dangerous for some people. Infants and young children, people 28 years of age and older, pregnant women, and people with certain health conditions or a weakened immune system are at greatest risk of flu complications. Pneumonia, bronchitis, sinus infections and ear infections are  examples of flu-related complications. If you have a medical condition, such as heart disease, cancer or diabetes, flu can make it worse. Flu can cause fever and chills, sore throat, muscle aches, fatigue, cough, headache, and runny or stuffy nose. Some people may have vomiting and diarrhea, though this is more common in children than adults. Each year thousands of people in the [place] die from flu, and many more are hospitalized. Flu vaccine prevents millions of  illnesses and flu-related visits to the doctor each year. 2. Influenza vaccine CDC recommends everyone 63 months of age and older get vaccinated every flu season. Children 6 months through 30 years of age may need 2 doses during a single flu season. Everyone else needs only 1 dose each flu season. It takes about 2 weeks for protection to develop after vaccination. There are many flu viruses, and they are always changing. Each year a new flu vaccine is made to protect against three or four viruses that are likely to cause disease in the upcoming flu season. Even when the vaccine doesn't exactly match these viruses, it may still provide some protection. Influenza vaccine does not cause flu. Influenza vaccine may be given at the same time as other vaccines. 3. Talk with your health care provider Tell your vaccine provider if the person getting the vaccine:  Has had an allergic reaction after a previous dose of influenza vaccine, or has any severe, life-threatening allergies.  Has ever had Guillain-Barr Syndrome (also called GBS). In some cases, your health care provider may decide to postpone influenza vaccination to a future visit. People with minor illnesses, such as a cold, may be vaccinated. People who are moderately or severely ill should usually wait until they recover before getting influenza vaccine. Your health care provider can give you more information. 4. Risks of a vaccine reaction  Soreness, redness, and swelling where  shot is given, fever, muscle aches, and headache can happen after influenza vaccine.  There may be a very small increased risk of Guillain-Barr Syndrome (GBS) after inactivated influenza vaccine (the flu shot). Young children who get the flu shot along with pneumococcal vaccine (PCV13), and/or DTaP vaccine at the same time might be slightly more likely to have a seizure caused by fever. Tell your health care provider if a child who is getting flu vaccine has ever had a seizure. People sometimes faint after medical procedures, including vaccination. Tell your provider if you feel dizzy or have vision changes or ringing in the ears. As with any medicine, there is a very remote chance of a vaccine causing a severe allergic reaction, other serious injury, or death. 5. What if there is a serious problem? An allergic reaction could occur after the vaccinated person leaves the clinic. If you see signs of a severe allergic reaction (hives, swelling of the face and throat, difficulty breathing, a fast heartbeat, dizziness, or weakness), call 9-1-1 and get the person to the nearest hospital. For other signs that concern you, call your health care provider. Adverse reactions should be reported to the Vaccine Adverse Event Reporting System (VAERS). Your health care provider will usually file this report, or you can do it yourself. Visit the VAERS website at www.vaers.SamedayNews.es or call (601) 130-3150.VAERS is only for reporting reactions, and VAERS staff do not give medical advice. 6. The National Vaccine Injury Compensation Program The [employer] Vaccine Injury Compensation Program (VICP) is a federal program that was created to compensate people who may have been injured by certain vaccines. Visit the VICP website at GoldCloset.com.ee or call 312-168-3011 to learn about the program and about filing a claim. There is a time limit to file a claim for compensation. 7. How can I learn more?  Ask your  healthcare provider.  Call your local or state health department.  Contact the Centers for Disease Control and Prevention (CDC): ? Call (281) 792-4645 (1-800-CDC-INFO) or ? Visit CDC's https://gibson.com/ Vaccine Information Statement (Interim) Inactivated Influenza Vaccine (09/23/2017) This information is  not intended to replace advice given to you by your health care provider. Make sure you discuss any questions you have with your health care provider. Document Revised: 05/17/2018 Document Reviewed: 09/27/2017 Elsevier Patient Education  [place].

## 2019-11-27 DIAGNOSIS — H43813 Vitreous degeneration, bilateral: Secondary | ICD-10-CM | POA: Diagnosis not present

## 2019-12-04 ENCOUNTER — Emergency Department
Admission: EM | Admit: 2019-12-04 | Discharge: 2019-12-04 | Disposition: A | Discharge status: Home / Self Care | Payer: No Typology Code available for payment source | Attending: Emergency Medicine | Admitting: Emergency Medicine

## 2019-12-04 ENCOUNTER — Emergency Department: Payer: No Typology Code available for payment source

## 2019-12-04 ENCOUNTER — Other Ambulatory Visit: Payer: Self-pay

## 2019-12-04 DIAGNOSIS — Z79899 Other long term (current) drug therapy: Secondary | ICD-10-CM | POA: Insufficient documentation

## 2019-12-04 DIAGNOSIS — I10 Essential (primary) hypertension: Secondary | ICD-10-CM | POA: Insufficient documentation

## 2019-12-04 DIAGNOSIS — Z96652 Presence of left artificial knee joint: Secondary | ICD-10-CM | POA: Diagnosis not present

## 2019-12-04 DIAGNOSIS — M542 Cervicalgia: Secondary | ICD-10-CM | POA: Insufficient documentation

## 2019-12-04 DIAGNOSIS — W228XXA Striking against or struck by other objects, initial encounter: Secondary | ICD-10-CM | POA: Diagnosis not present

## 2019-12-04 DIAGNOSIS — Y9389 Activity, other specified: Secondary | ICD-10-CM | POA: Insufficient documentation

## 2019-12-04 DIAGNOSIS — S0990XD Unspecified injury of head, subsequent encounter: Secondary | ICD-10-CM | POA: Diagnosis not present

## 2019-12-04 DIAGNOSIS — Z87891 Personal history of nicotine dependence: Secondary | ICD-10-CM | POA: Insufficient documentation

## 2019-12-04 DIAGNOSIS — Y9289 Other specified places as the place of occurrence of the external cause: Secondary | ICD-10-CM | POA: Diagnosis not present

## 2019-12-04 DIAGNOSIS — Z7982 Long term (current) use of aspirin: Secondary | ICD-10-CM | POA: Diagnosis not present

## 2019-12-04 DIAGNOSIS — R519 Headache, unspecified: Secondary | ICD-10-CM | POA: Diagnosis present

## 2019-12-04 NOTE — ED Provider Notes (Signed)
[facility] Emergency Department Provider Note ____________________________________________   First MD Initiated Contact with Patient 12/04/19 0902     (approximate)  I have reviewed the triage vital signs and the nursing notes.   HISTORY  Chief Complaint Fall    HPI Catherine Burgess is a 84 y.o. female with PMH as noted below who presents with persistent headache and neck pain after a fall last week.  The patient states that she fell from standing height 5 days ago.  She did not lose consciousness.  She was seen at urgent care and examined.  At that time the decision was made that she did not need any imaging.  She reports persistent intermittent pain to the top of her head, as well as to both sides of her neck.  She has no numbness or weakness.  She denies any dizziness or lightheadedness, difficulty concentrating, nausea, or other acute symptoms.  She has been taking Tylenol with moderate relief.  Past Medical History:  Diagnosis Date  . Degenerative joint disease   . GERD (gastroesophageal reflux disease)   . Hyperlipidemia   . Hypertension   . Malignant essential hypertension 07/23/2016  . Osteoarthrosis   . Reflux esophagitis     Patient Active Problem List   Diagnosis Date Noted  . Pre-diabetes 10/02/2018  . Abnormal ankle brachial index (ABI) 03/02/2018  . Calcified cerebral meningioma ([place]) 10/04/2017  . History of CVA (cerebrovascular accident) without residual deficits 10/04/2017  . Subacromial impingement of right shoulder 10/04/2017  . Volvulus ([place])   . History of anemia 10/13/2016  . Syncope 07/23/2016  . Hypokalemia 07/23/2016  . Abnormal CT of brain 07/23/2016  . Vitamin D deficiency 10/28/2015  . Post menopausal syndrome 10/28/2015  . Osteopenia after menopause 10/28/2015  . Cervical disc disease 10/28/2015  . H/O total knee replacement 01/20/2015  . Vaginal atrophy 09/26/2014  . Osteoarthritis of both knees 07/31/2014  .  GERD without esophagitis 07/31/2014  . HLD (hyperlipidemia) 07/31/2014  . Hypertension 07/31/2014    Past Surgical History:  Procedure Laterality Date  . CATARACT EXTRACTION, BILATERAL    . HIP PINNING Left 1956  . REPLACEMENT TOTAL KNEE Left 11/12/2009  . TOTAL KNEE REVISION Left 12/07/2016   Procedure: TOTAL KNEE REVISION;  Surgeon: Dereck Leep, MD;  Location: [facility] ORS;  Service: Orthopedics;  Laterality: Left;    Prior to Admission medications   Medication Sig Start Date End Date Taking? Authorizing Provider  acetaminophen (TYLENOL) 500 MG tablet Take 1 tablet (500 mg total) by mouth 2 (two) times daily. Patient taking differently: Take 500 mg by mouth every 6 (six) hours as needed for mild pain.  10/28/15   Steele Sizer, MD  amLODipine (NORVASC) 2.5 MG tablet Take 1 tablet (2.5 mg total) by mouth daily. Patient taking differently: Take 2.5 mg by mouth daily. Patient taking one in am and 2 tablets in pm 10/02/19   Steele Sizer, MD  aspirin EC 81 MG tablet Take 81 mg by mouth.    [provider]  atorvastatin (LIPITOR) 40 MG tablet Take 1 tablet (40 mg total) by mouth daily. 10/02/19   Steele Sizer, MD  baclofen (LIORESAL) 10 MG tablet Take 1-2 tablets (10-20 mg total) by mouth 3 (three) times daily. 01/30/19   Steele Sizer, MD  Calcium-Vitamin D 600-200 MG-UNIT tablet Take 1 tablet by mouth daily.     [provider]  fluticasone (FLONASE) 50 MCG/ACT nasal spray Place into the nose. 01/09/18  [provider]  gabapentin (NEURONTIN) 300 MG capsule TAKE ONE CAPSULE BY MOUTH TWICE DAILY Patient taking differently: Take by mouth 2 (two) times daily. Take 300mg  in the morning and 600 mg at bedtime 06/05/18   Steele Sizer, MD  loratadine (CLARITIN) 10 MG tablet Take 1 tablet (10 mg total) by mouth daily. 05/31/19   Steele Sizer, MD  losartan (COZAAR) 50 MG tablet Take 1 tablet (50 mg total) by mouth daily. 10/02/19   Steele Sizer, MD    pantoprazole (PROTONIX) 40 MG tablet TAKE 1 TABLET(40 MG) BY MOUTH DAILY 04/04/19   Steele Sizer, MD  vitamin B-12 (CYANOCOBALAMIN) 1000 MCG tablet Take 1,000 mcg by mouth daily.    [provider]    Allergies Lovastatin, Pravastatin, Rosuvastatin, and Statins  Family History  Problem Relation Age of Onset  . Emphysema Father        Smoker  . CAD Mother   . Stroke Mother   . Diabetes Brother   . Seizures Brother   . Cancer Brother        unknown  . Diabetes Brother     Social History Social History   Tobacco Use  . Smoking status: Former Smoker    Packs/day: 0.25    Years: 1.00    Pack years: 0.25    Types: Cigarettes    Quit date: 1966    Years since quitting: 55.8  . Smokeless tobacco: Never Used  . Tobacco comment: smoking cessation materials not required  Vaping Use  . Vaping Use: Never used  Substance Use Topics  . Alcohol use: No    Alcohol/week: 0.0 standard drinks  . Drug use: No    Review of Systems  Constitutional: No fever. Eyes: No redness. ENT: Positive for mild neck pain. Cardiovascular: Denies chest pain. Respiratory: Denies shortness of breath. Gastrointestinal: No vomiting or diarrhea.  Genitourinary: Negative for dysuria.  Musculoskeletal: Negative for back pain. Skin: Negative for rash. Neurological: Positive for headaches.   ____________________________________________   PHYSICAL EXAM:  VITAL SIGNS: ED Triage Vitals  Enc Vitals Group     BP 12/04/19 0826 (!) 154/63     Pulse Rate 12/04/19 0826 70     Resp 12/04/19 0826 16     Temp 12/04/19 0827 98.3 F (36.8 C)     Temp Source 12/04/19 0826 Oral     SpO2 12/04/19 0826 100 %     Weight 12/04/19 0827 145 lb (65.8 kg)     Height 12/04/19 0827 5\' 3"  (1.6 m)     Head Circumference --      Peak Flow --      Pain Score 12/04/19 0826 5     Pain Loc --      Pain Edu? --      Excl. in [place]? --     Constitutional: Alert and oriented. Well appearing and in no acute  distress. Eyes: Conjunctivae are normal.  EOMI.  PERRLA. Head: Atraumatic. Nose: No congestion/rhinnorhea. Mouth/Throat: Mucous membranes are moist.   Neck: Normal range of motion.  No midline spinal tenderness.  Bilateral mild paraspinal tenderness. Cardiovascular: Normal rate, regular rhythm. Good peripheral circulation. Respiratory: Normal respiratory effort.  No retractions.  Gastrointestinal: No distention.  Musculoskeletal: Extremities warm and well perfused.  Neurologic:  Normal speech and language.  Motor and sensory intact in all extremities.  Normal coordination.  Normal gait.  No gross focal neurologic deficits are appreciated.  Skin:  Skin is warm and dry. No rash noted. Psychiatric:  Mood and affect are normal. Speech and behavior are normal.  ____________________________________________   LABS (all labs ordered are listed, but only abnormal results are displayed)  Labs Reviewed - No data to display ____________________________________________  EKG  ____________________________________________  RADIOLOGY  CT head: No ICH or other acute abnormality CT cervical spine: No acute fracture  ____________________________________________   PROCEDURES  Procedure(s) performed: No  Procedures  Critical Care performed: No ____________________________________________   INITIAL IMPRESSION / ASSESSMENT AND PLAN / ED COURSE  Pertinent labs & imaging results that were available during my care of the patient were reviewed by me and considered in my medical decision making (see chart for details).  84 year old female presents for persistent intermittent symptoms of headache and neck pain after a fall and head injury 5 days ago.  I reviewed the past medical records in [place]; the patient was seen at urgent care and based on shared decision making, no imaging was obtained at that time.  On exam, the patient is well-appearing.  Her vital signs are normal.  The  physical exam is unremarkable.  There is no midline spinal tenderness.  Neurologic exam is normal.  Overall presentation is consistent with symptoms after a minor head injury or mild concussion.  Given the patient's age and the persistent nature of the symptoms we obtained CT head and C-spine from triage.  Both are negative for acute findings.  At this time, the patient is stable for discharge home.  I counseled her on the results of the work-up.  I recommend continued Tylenol as needed and PMD follow-up.  Return precautions given, and she expresses understanding.  ____________________________________________   FINAL CLINICAL IMPRESSION(S) / ED DIAGNOSES  Final diagnoses:  Minor head injury, subsequent encounter      NEW MEDICATIONS STARTED DURING THIS VISIT:  New Prescriptions   No medications on file     Note:  This document was prepared using Dragon voice recognition software and may include unintentional dictation errors.    Arta Silence, MD 12/04/19 [phone]

## 2019-12-04 NOTE — ED Notes (Signed)
All workers comp paperwork has already been filed per pt.

## 2019-12-04 NOTE — ED Notes (Signed)
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

## 2019-12-04 NOTE — ED Triage Notes (Signed)
Pt states she fell last week while at work and hit the back of her head, states she was seen at the urgent care, states she continues to have head and neck pain. Pt is ambulatory to triage, a/ox4

## 2019-12-19 DIAGNOSIS — M542 Cervicalgia: Secondary | ICD-10-CM | POA: Diagnosis not present

## 2019-12-19 DIAGNOSIS — R5383 Other fatigue: Secondary | ICD-10-CM | POA: Diagnosis not present

## 2019-12-19 DIAGNOSIS — R52 Pain, unspecified: Secondary | ICD-10-CM | POA: Diagnosis not present

## 2019-12-19 DIAGNOSIS — T8089XA Other complications following infusion, transfusion and therapeutic injection, initial encounter: Secondary | ICD-10-CM | POA: Diagnosis not present

## 2019-12-28 DIAGNOSIS — R200 Anesthesia of skin: Secondary | ICD-10-CM | POA: Diagnosis not present

## 2020-01-18 DIAGNOSIS — I10 Essential (primary) hypertension: Secondary | ICD-10-CM | POA: Diagnosis not present

## 2020-01-18 DIAGNOSIS — R42 Dizziness and giddiness: Secondary | ICD-10-CM | POA: Diagnosis not present

## 2020-01-18 DIAGNOSIS — R0789 Other chest pain: Secondary | ICD-10-CM | POA: Diagnosis not present

## 2020-01-18 DIAGNOSIS — I499 Cardiac arrhythmia, unspecified: Secondary | ICD-10-CM | POA: Diagnosis not present

## 2020-01-18 DIAGNOSIS — Z8673 Personal history of transient ischemic attack (TIA), and cerebral infarction without residual deficits: Secondary | ICD-10-CM | POA: Diagnosis not present

## 2020-04-03 NOTE — Progress Notes (Unsigned)
Name: Catherine Burgess   MRN: [id number]    DOB: 1935-10-21   Date:04/05/2020       Progress Note  Subjective  Chief Complaint  Follow up   HPI   Pigmented purpuric dermatosis:  Started end of 2020, seen by Dermatologist she is using topical medication, she states having some itching around her mouth, she states medication is not working , advised her to go back for follow up  Abnormal MRI brain from 2018 also abnormal CT brain from 06/2017. CT showed calcified meningioma, she denies headaches, she saw Dr. Manuella Ghazi but they just discussed neuropathy and is taking higher dose gabapentin, she states neurologist asked for B12 levels, she continues to have tingling and burning on tip of fingers but no weakness  AR: she states not having any problems at this time, she uses nasal spray prn only . Denies rhinorrhea or nasal congestion   GERD: she is now taking it daily secondary of heartburn and indigestion. She states has change in bowel movements going on for months, also has some Left lower quadrant pain intermittent , discussed referral to GI but she refused, offered to check labs such as sed rate, CRP and CBC and she agreed to that, referral will be placed if labs abnormal . She has a history of volvulus, denies blood in stools.   OA/s/p left knee replacement: only has about 30 degree of flexion and has mild pain, aching, she did not use her cane to come in today, only uses it prn   Hyperlipidemia: taking medication, denies myalgia, no chest pain , she is due for repeat labs   HTN: bpis at goal today, she is tolerating medication well,  no chest pain, dizziness  or palpitation.  Osteopenia: reviewed last bone density 2020, continue high calcium diet , and take vitamin D 2000 units daily , also needs to continue being physically active. Unchanged   Hyperglycemia : last A1C was 6.4 %, she denies polyphagia, polydipsia or polyuria, we will recheck levels today   Patient Active Problem List    Diagnosis Date Noted  . Pre-diabetes 10/02/2018  . Abnormal ankle brachial index (ABI) 03/02/2018  . Calcified cerebral meningioma ([place]) 10/04/2017  . History of CVA (cerebrovascular accident) without residual deficits 10/04/2017  . Subacromial impingement of right shoulder 10/04/2017  . Volvulus ([place])   . History of anemia 10/13/2016  . Syncope 07/23/2016  . Hypokalemia 07/23/2016  . Abnormal CT of brain 07/23/2016  . Vitamin D deficiency 10/28/2015  . Post menopausal syndrome 10/28/2015  . Osteopenia after menopause 10/28/2015  . Cervical disc disease 10/28/2015  . H/O total knee replacement 01/20/2015  . Vaginal atrophy 09/26/2014  . Osteoarthritis of both knees 07/31/2014  . GERD without esophagitis 07/31/2014  . HLD (hyperlipidemia) 07/31/2014  . Hypertension 07/31/2014    Past Surgical History:  Procedure Laterality Date  . CATARACT EXTRACTION, BILATERAL    . HIP PINNING Left 1956  . REPLACEMENT TOTAL KNEE Left 11/12/2009  . TOTAL KNEE REVISION Left 12/07/2016   Procedure: TOTAL KNEE REVISION;  Surgeon: Dereck Leep, MD;  Location: [facility] ORS;  Service: Orthopedics;  Laterality: Left;    Family History  Problem Relation Age of Onset  . Emphysema Father        Smoker  . CAD Mother   . Stroke Mother   . Diabetes Brother   . Seizures Brother   . Cancer Brother        unknown  . Diabetes Brother  Social History   Tobacco Use  . Smoking status: Former Smoker    Packs/day: 0.25    Years: 1.00    Pack years: 0.25    Types: Cigarettes    Quit date: 1966    Years since quitting: 56.1  . Smokeless tobacco: Never Used  . Tobacco comment: smoking cessation materials not required  Substance Use Topics  . Alcohol use: No    Alcohol/week: 0.0 standard drinks     Current Outpatient Medications:  .  acetaminophen (TYLENOL) 500 MG tablet, Take 1 tablet (500 mg total) by mouth 2 (two) times daily. (Patient taking differently: Take 500 mg by mouth every 6  (six) hours as needed for mild pain.), Disp: 180 tablet, Rfl: 0 .  amLODipine (NORVASC) 2.5 MG tablet, Take 1 tablet (2.5 mg total) by mouth daily. (Patient taking differently: Take 2.5 mg by mouth daily. Patient taking one in am and 2 tablets in pm), Disp: 90 tablet, Rfl: 1 .  aspirin EC 81 MG tablet, Take 81 mg by mouth., Disp: , Rfl:  .  atorvastatin (LIPITOR) 40 MG tablet, Take 1 tablet (40 mg total) by mouth daily., Disp: 90 tablet, Rfl: 0 .  baclofen (LIORESAL) 10 MG tablet, Take 1-2 tablets (10-20 mg total) by mouth 3 (three) times daily., Disp: 30 each, Rfl: 0 .  Calcium-Vitamin D 600-200 MG-UNIT tablet, Take 1 tablet by mouth daily. , Disp: , Rfl:  .  famotidine (PEPCID) 40 MG tablet, Take 40 mg by mouth daily., Disp: , Rfl:  .  fluticasone (FLONASE) 50 MCG/ACT nasal spray, Place into the nose., Disp: , Rfl:  .  gabapentin (NEURONTIN) 300 MG capsule, TAKE ONE CAPSULE BY MOUTH TWICE DAILY (Patient taking differently: Take by mouth 2 (two) times daily. Take 300mg  in the morning and 600 mg at bedtime), Disp: 180 capsule, Rfl: 1 .  loratadine (CLARITIN) 10 MG tablet, Take 1 tablet (10 mg total) by mouth daily., Disp: 30 tablet, Rfl: 0 .  losartan (COZAAR) 50 MG tablet, Take 1 tablet (50 mg total) by mouth daily., Disp: 90 tablet, Rfl: 1 .  pantoprazole (PROTONIX) 40 MG tablet, TAKE 1 TABLET(40 MG) BY MOUTH DAILY, Disp: 90 tablet, Rfl: 1 .  vitamin B-12 (CYANOCOBALAMIN) 1000 MCG tablet, Take 1,000 mcg by mouth daily., Disp: , Rfl:   Allergies  Allergen Reactions  . Lovastatin Other (See Comments)    chest  . Pravastatin     Other reaction(s): Muscle Pain chest  . Rosuvastatin     Other reaction(s): Muscle Pain chest Other reaction(s): Muscle Pain chest  . Statins     Other reaction(s): Muscle Pain chest    I personally reviewed active problem list, medication list, allergies, family history, social history, health maintenance, notes from last encounter with the patient/caregiver  today.   ROS  Constitutional: Negative for fever , positive for mild  weight change.  Respiratory: Negative for cough and shortness of breath.   Cardiovascular: Negative for chest pain or palpitations.  Gastrointestinal: Negative for abdominal pain, no bowel changes.  Musculoskeletal: Negative for gait problem or joint swelling.  Skin: Negative for rash.  Neurological: Negative for dizziness or headache.  No other specific complaints in a complete review of systems (except as listed in HPI above).  Objective  Vitals:   04/05/20 1026  BP: 130/68  Pulse: 79  Resp: 16  Temp: 98.4 F (36.9 C)  TempSrc: Oral  SpO2: 96%  Weight: 141 lb (64 kg)  Height: 5\' 3"  (1.6  m)    Body mass index is 24.98 kg/m.  Physical Exam  Constitutional: Patient appears well-developed and well-nourished.  No distress.  HEENT: head atraumatic, normocephalic, pupils equal and reactive to light,  neck supple Cardiovascular: Normal rate, regular rhythm and normal heart sounds.  No murmur heard. No BLE edema. Pulmonary/Chest: Effort normal and breath sounds normal. No respiratory distress. Abdominal: Soft.  There is no tenderness. Skin: hyperpigmentation on face, no rash  Psychiatric: Patient has a normal mood and affect. behavior is normal. Judgment and thought content normal.  PHQ2/9: Depression screen [facility] 2/9 04/05/2020 10/17/2019 10/02/2019 05/31/2019 01/30/2019  Decreased Interest 0 0 0 0 0  Down, Depressed, Hopeless 0 0 0 0 0  PHQ - 2 Score 0 0 0 0 0  Altered sleeping - - 0 0 0  Tired, decreased energy - - 0 0 0  Change in appetite - - 0 0 0  Feeling bad or failure about yourself  - - 0 0 0  Trouble concentrating - - 0 0 0  Moving slowly or fidgety/restless - - 0 0 0  Suicidal thoughts - - 0 0 0  PHQ-9 Score - - 0 0 0  Difficult doing work/chores - - - - -  Some recent data might be hidden    phq 9 is negative  Fall Risk: Fall Risk  04/05/2020 10/17/2019 10/02/2019 05/31/2019 10/11/2018  Falls  in the past year? 1 0 0 0 0  Number falls in past yr: 0 0 0 0 0  Injury with Fall? 0 0 0 0 0  Risk for fall due to : - Impaired vision;Impaired balance/gait - - Impaired balance/gait  Risk for fall due to: Comment - - - - -  Follow up - Falls prevention discussed - - Falls prevention discussed     Functional Status Survey: Is the patient deaf or have difficulty hearing?: Yes Does the patient have difficulty seeing, even when wearing glasses/contacts?: Yes Does the patient have difficulty concentrating, remembering, or making decisions?: No Does the patient have difficulty walking or climbing stairs?: No Does the patient have difficulty dressing or bathing?: No Does the patient have difficulty doing errands alone such as visiting a doctor's office or shopping?: No    Assessment & Plan  1. Hyperglycemia  - Hemoglobin A1c  2. Essential hypertension  - COMPLETE METABOLIC PANEL WITH GFR - losartan (COZAAR) 50 MG tablet; Take 1 tablet (50 mg total) by mouth daily.  Dispense: 90 tablet; Refill: 1 - amLODipine (NORVASC) 2.5 MG tablet; Take 1 tablet (2.5 mg total) by mouth daily.  Dispense: 90 tablet; Refill: 1  3. Peripheral polyneuropathy  - Vitamin B12  4. Vitamin D deficiency   5. GERD without esophagitis  - pantoprazole (PROTONIX) 40 MG tablet; Take 1 tablet (40 mg total) by mouth daily.  Dispense: 90 tablet; Refill: 1  6. History of CVA (cerebrovascular accident) without residual deficits  - Lipid panel  7. Osteopenia of multiple sites   8. Calcified cerebral meningioma ([place])  Keep follow up with Dr. Manuella Ghazi  9. Primary osteoarthritis of both knees   10. Cervical disc disease  Takes baclofen seldom   11. Neck pain   12. Dermatitis   13. Mixed hyperlipidemia  - Lipid panel - atorvastatin (LIPITOR) 40 MG tablet; Take 1 tablet (40 mg total) by mouth daily.  Dispense: 90 tablet; Refill: 1  14. Recent change in frequency of bowel movements   15. Change  in bowel movement  - CBC  with Differential/Platelet - Sedimentation rate - C-reactive protein  16. Rash in adult  Referred back to dermatologist   17. Facial rash  - desonide (DESOWEN) 0.05 % cream; Apply topically 2 (two) times daily.  Dispense: 30 g; Refill: 0 - Ambulatory referral to Dermatology There are no diagnoses linked to this encounter.

## 2020-04-05 ENCOUNTER — Ambulatory Visit (INDEPENDENT_AMBULATORY_CARE_PROVIDER_SITE_OTHER): Payer: Medicare Other | Admitting: Family Medicine

## 2020-04-05 ENCOUNTER — Other Ambulatory Visit: Payer: Self-pay

## 2020-04-05 ENCOUNTER — Encounter: Payer: Self-pay | Admitting: Family Medicine

## 2020-04-05 VITALS — BP 130/68 | HR 79 | Temp 98.4°F | Resp 16 | Ht 63.0 in | Wt 141.0 lb

## 2020-04-05 DIAGNOSIS — E559 Vitamin D deficiency, unspecified: Secondary | ICD-10-CM

## 2020-04-05 DIAGNOSIS — D320 Benign neoplasm of cerebral meninges: Secondary | ICD-10-CM

## 2020-04-05 DIAGNOSIS — R739 Hyperglycemia, unspecified: Secondary | ICD-10-CM

## 2020-04-05 DIAGNOSIS — R194 Change in bowel habit: Secondary | ICD-10-CM

## 2020-04-05 DIAGNOSIS — M8589 Other specified disorders of bone density and structure, multiple sites: Secondary | ICD-10-CM | POA: Diagnosis not present

## 2020-04-05 DIAGNOSIS — G629 Polyneuropathy, unspecified: Secondary | ICD-10-CM

## 2020-04-05 DIAGNOSIS — M5090 Cervical disc disorder, unspecified, unspecified cervical region: Secondary | ICD-10-CM

## 2020-04-05 DIAGNOSIS — L309 Dermatitis, unspecified: Secondary | ICD-10-CM

## 2020-04-05 DIAGNOSIS — R198 Other specified symptoms and signs involving the digestive system and abdomen: Secondary | ICD-10-CM | POA: Diagnosis not present

## 2020-04-05 DIAGNOSIS — M542 Cervicalgia: Secondary | ICD-10-CM

## 2020-04-05 DIAGNOSIS — K219 Gastro-esophageal reflux disease without esophagitis: Secondary | ICD-10-CM | POA: Diagnosis not present

## 2020-04-05 DIAGNOSIS — I10 Essential (primary) hypertension: Secondary | ICD-10-CM | POA: Diagnosis not present

## 2020-04-05 DIAGNOSIS — E782 Mixed hyperlipidemia: Secondary | ICD-10-CM

## 2020-04-05 DIAGNOSIS — M170 Bilateral primary osteoarthritis of knee: Secondary | ICD-10-CM | POA: Diagnosis not present

## 2020-04-05 DIAGNOSIS — R21 Rash and other nonspecific skin eruption: Secondary | ICD-10-CM

## 2020-04-05 DIAGNOSIS — E538 Deficiency of other specified B group vitamins: Secondary | ICD-10-CM | POA: Diagnosis not present

## 2020-04-05 DIAGNOSIS — Z8673 Personal history of transient ischemic attack (TIA), and cerebral infarction without residual deficits: Secondary | ICD-10-CM

## 2020-04-05 MED ORDER — LOSARTAN POTASSIUM 50 MG PO TABS: 50.0000 mg | ORAL_TABLET | Freq: Every day | ORAL | 1 refills | Status: DC

## 2020-04-05 MED ORDER — DESONIDE 0.05 % EX CREA: TOPICAL_CREAM | Freq: Two times a day (BID) | CUTANEOUS | 0 refills | Status: DC

## 2020-04-05 MED ORDER — AMLODIPINE BESYLATE 2.5 MG PO TABS: 2.5000 mg | ORAL_TABLET | Freq: Every day | ORAL | 1 refills | Status: DC

## 2020-04-05 MED ORDER — ATORVASTATIN CALCIUM 40 MG PO TABS: 40.0000 mg | ORAL_TABLET | Freq: Every day | ORAL | 1 refills | Status: DC

## 2020-04-05 MED ORDER — PANTOPRAZOLE SODIUM 40 MG PO TBEC
40.0000 mg | DELAYED_RELEASE_TABLET | Freq: Every day | ORAL | 1 refills | Status: DC
Start: 2020-04-05 — End: 2020-10-20

## 2020-04-06 LAB — COMPLETE METABOLIC PANEL WITH GFR
AG Ratio: 1.7 (calc) (ref 1.0–2.5)
ALT: 15 U/L (ref 6–29)
AST: 16 U/L (ref 10–35)
Albumin: 3.8 g/dL (ref 3.6–5.1)
Alkaline phosphatase (APISO): 113 U/L (ref 37–153)
BUN: 14 mg/dL (ref 7–25)
CO2: 28 mmol/L (ref 20–32)
Calcium: 9.3 mg/dL (ref 8.6–10.4)
Chloride: 108 mmol/L (ref 98–110)
Creat: 0.66 mg/dL (ref 0.60–0.88)
GFR, Est African American: 95 mL/min/{1.73_m2} (ref >=60)
GFR, Est Non African American: 82 mL/min/{1.73_m2} (ref >=60)
Globulin: 2.3 g/dL (calc) (ref 1.9–3.7)
Glucose, Bld: 85 mg/dL (ref 65–99)
Potassium: 3.9 mmol/L (ref 3.5–5.3)
Sodium: 144 mmol/L (ref 135–146)
Total Bilirubin: 0.9 mg/dL (ref 0.2–1.2)
Total Protein: 6.1 g/dL (ref 6.1–8.1)

## 2020-04-06 LAB — CBC WITH DIFFERENTIAL/PLATELET
Absolute Monocytes: 546 cells/uL (ref 200–950)
Basophils Absolute: 47 cells/uL (ref 0–200)
Basophils Relative: 0.6 %
Eosinophils Absolute: 78 cells/uL (ref 15–500)
Eosinophils Relative: 1 %
HCT: 44.7 % (ref 35.0–45.0)
Hemoglobin: 14.6 g/dL (ref 11.7–15.5)
Lymphs Abs: 1482 cells/uL (ref 850–3900)
MCH: 27.9 pg (ref 27.0–33.0)
MCHC: 32.7 g/dL (ref 32.0–36.0)
MCV: 85.3 fL (ref 80.0–100.0)
MPV: 10.5 fL (ref 7.5–12.5)
Monocytes Relative: 7 %
Neutro Abs: 5647 cells/uL (ref 1500–7800)
Neutrophils Relative %: 72.4 %
Platelets: 293 10*3/uL (ref 140–400)
RBC: 5.24 10*6/uL — ABNORMAL HIGH (ref 3.80–5.10)
RDW: 13 % (ref 11.0–15.0)
Total Lymphocyte: 19 %
WBC: 7.8 10*3/uL (ref 3.8–10.8)

## 2020-04-06 LAB — LIPID PANEL
Cholesterol: 113 mg/dL (ref <200)
HDL: 44 mg/dL — ABNORMAL LOW (ref >=50)
LDL Cholesterol (Calc): 54 mg/dL (calc)
Non-HDL Cholesterol (Calc): 69 mg/dL (calc) (ref <130)
Total CHOL/HDL Ratio: 2.6 (calc) (ref <5.0)
Triglycerides: 72 mg/dL (ref <150)

## 2020-04-06 LAB — HEMOGLOBIN A1C
Hgb A1c MFr Bld: 6.3 % of total Hgb — ABNORMAL HIGH (ref <5.7)
Mean Plasma Glucose: 134 mg/dL
eAG (mmol/L): 7.4 mmol/L

## 2020-04-06 LAB — C-REACTIVE PROTEIN: CRP: 0.5 mg/L (ref <8.0)

## 2020-04-06 LAB — VITAMIN B12: Vitamin B-12: 1341 pg/mL — ABNORMAL HIGH (ref 200–1100)

## 2020-04-06 LAB — SEDIMENTATION RATE: Sed Rate: 2 mm/h (ref 0–30)

## 2020-04-17 ENCOUNTER — Other Ambulatory Visit: Payer: Self-pay

## 2020-04-17 ENCOUNTER — Ambulatory Visit: Payer: Medicare Other | Admitting: Dermatology

## 2020-04-17 DIAGNOSIS — L309 Dermatitis, unspecified: Secondary | ICD-10-CM | POA: Diagnosis not present

## 2020-04-17 DIAGNOSIS — L700 Acne vulgaris: Secondary | ICD-10-CM

## 2020-04-17 MED ORDER — ADAPALENE 0.3 % EX GEL
CUTANEOUS | 1 refills | Status: DC
Start: 2020-04-17 — End: 2020-04-22

## 2020-04-17 NOTE — Patient Instructions (Signed)
Dry Skin Care  What causes dry skin?  Dry skin is common and results from inadequate moisture in the outer skin layers. Dry skin usually results from the excessive loss of moisture from the skin surface. This occurs due to two major factors: 1. Normally the skin's oil glands deposit a layer of oil on the skin's surface. This layer of oil prevents the loss of moisture from the skin. Exposure to soaps, cleaners, solvents, and disinfectants removes this oily film, allowing water to escape. 2. Water loss from the skin increases when the humidity is low. During winter months we spend a lot of time indoors where the air is heated. Heated air has very low humidity. This also contributes to dry skin.  A tendency for dry skin may accompany such disorders as eczema. Also, as people age, the number of functioning oil glands decreases, and the tendency toward dry skin can be a sensation of skin tightness when emerging from the shower.  How do I manage dry skin?  1. Humidify your environment. This can be accomplished by using a humidifier in your bedroom at night during winter months. 2. Bathing can actually put moisture back into your skin if done right. Take the following steps while bathing to sooth dry skin:  Avoid hot water, which only dries the skin and makes itching worse. Use warm water.  Avoid washcloths or extensive rubbing or scrubbing.  Use mild soaps like unscented Dove, Oil of Olay, Cetaphil, Basis, or CeraVe.  If you take baths rather than showers, rinse off soap residue with clean water before getting out of tub.  Once out of the shower/tub, pat dry gently with a soft towel. Leave your skin damp.  While still damp, apply any medicated ointment/cream you were prescribed to the affected areas. After you apply your medicated ointment/cream, then apply your moisturizer to your whole body.This is the most important step in dry skin care. If this is omitted, your skin will continue to be  dry.  The choice of moisturizer is also very important. In general, lotion will not provider enough moisture to severely dry skin because it is water based. You should use an ointment or cream. Moisturizers should also be unscented. Good choices include Vaseline (plain petrolatum), Aquaphor, Cetaphil, CeraVe, Vanicream, DML Forte, Aveeno moisture, or Eucerin Cream.  Bath oils can be helpful, but do not replace the application of moisturizer after the bath. In addition, they make the tub slippery causing an increased risk for falls. Therefore, we do not recommend their use.  Topical retinoid medications like tretinoin/Retin-A, adapalene/Differin, tazarotene/Fabior, and Epiduo/Epiduo Forte can cause dryness and irritation when first started. Only apply a pea-sized amount to the entire affected area. Avoid applying it around the eyes, edges of mouth and creases at the nose. If you experience irritation, use a good moisturizer first and/or apply the medicine less often. If you are doing well with the medicine, you can increase how often you use it until you are applying every night. Be careful with sun protection while using this medication as it can make you sensitive to the sun. This medicine should not be used by pregnant women.

## 2020-04-17 NOTE — Progress Notes (Signed)
   New Patient Visit  Subjective  Catherine Burgess is a 85 y.o. female who presents for the following: Rash (Patient here to have rash on the face evaluated. It started 1-2 months ago and is itchy. She is using an OTC antifungal cream and it is helping a little. She recently had another rash that started on her legs and spread to her arms. She is using desonide cream to those areas as needed. She has not tried desonide cream to rash on face. She previously used triamcinolone on arms and legs without improvement. ). Rash on body is doing better now with desonide cream.   The following portions of the chart were reviewed this encounter and updated as appropriate:       Review of Systems:  No other skin or systemic complaints except as noted in HPI or Assessment and Plan.  Objective  Well appearing patient in no apparent distress; mood and affect are within normal limits.  A focused examination was performed including face, arms, legs. Relevant physical exam findings are noted in the Assessment and Plan.  Objective  face: Closed comedones left perioral chin  Objective  arms, legs: Xerosis on legs, arms.  Pt says rash has improved with topical steroid cream   Assessment & Plan  Acne vulgaris face  Comedonal  Start adapalene 0.3% gel Apply a pea-sized amount to AA face qhs as tolerated dsp 45g 1Rf.  Stop using cocoa butter cream on face (may cause pores to plug), start Neutrogena hydroboost gel/cream prior to applying adapalene   Topical retinoid medications like tretinoin/Retin-A, adapalene/Differin, tazarotene/Fabior, and Epiduo/Epiduo Forte can cause dryness and irritation when first started. Only apply a pea-sized amount to the entire affected area. Avoid applying it around the eyes, edges of mouth and creases at the nose. If you experience irritation, use a good moisturizer first and/or apply the medicine less often. If you are doing well with the medicine, you can increase  how often you use it until you are applying every night. Be careful with sun protection while using this medication as it can make you sensitive to the sun. This medicine should not be used by pregnant women.    Adapalene (DIFFERIN) 0.3 % gel - face  Dermatitis arms, legs  Improved  Continue desonide cream qd/bid prn flares. Patient has at home. Ok to use on face prn itch.  Recommend mild soap and moisturizing cream 1-2 times daily.    Return if symptoms worsen or fail to improve.   IJamesetta Orleans, CMA, am acting as scribe for Brendolyn Patty, MD .  Documentation: I have reviewed the above documentation for accuracy and completeness, and I agree with the above.  Brendolyn Patty MD

## 2020-04-18 ENCOUNTER — Telehealth: Payer: Self-pay

## 2020-04-18 NOTE — Telephone Encounter (Signed)
Copied from [place] [id number]. Topic: General - Other >> Apr 18, 2020  9:31 AM Catherine Burgess wrote: Reason for CRM: Pt called and asked to speak with Dr. [name] or her nurse about her stomach issues/ Pt mention that she was suppose to get some information for a specialist / please advise

## 2020-04-18 NOTE — Telephone Encounter (Signed)
Left a v/m for callback

## 2020-04-19 NOTE — Progress Notes (Signed)
Name: Catherine Burgess   MRN: [id number]    DOB: 04-25-1935   Date:04/22/2020       Progress Note  Subjective  Chief Complaint  Stomach pains  HPI   Abdominal pain/change in bowel movements: she is now taking it daily secondary of heartburn and indigestion. She states has change in bowel movements going on for months, also has some Left lower quadrant pain intermittent ,she came in last month and since last visit she states stools are very dark, black one time last week. Also has periods of constipation and diarrhea. Had a bowel movement today and it was not black. , pain today is on her epigastric area    Patient Active Problem List   Diagnosis Date Noted  . Pre-diabetes 10/02/2018  . Abnormal ankle brachial index (ABI) 03/02/2018  . Calcified cerebral meningioma ([place]) 10/04/2017  . History of CVA (cerebrovascular accident) without residual deficits 10/04/2017  . Subacromial impingement of right shoulder 10/04/2017  . Volvulus ([place])   . History of anemia 10/13/2016  . Syncope 07/23/2016  . Hypokalemia 07/23/2016  . Abnormal CT of brain 07/23/2016  . Vitamin D deficiency 10/28/2015  . Post menopausal syndrome 10/28/2015  . Osteopenia after menopause 10/28/2015  . Cervical disc disease 10/28/2015  . H/O total knee replacement 01/20/2015  . Vaginal atrophy 09/26/2014  . Osteoarthritis of both knees 07/31/2014  . GERD without esophagitis 07/31/2014  . HLD (hyperlipidemia) 07/31/2014  . Hypertension 07/31/2014    Past Surgical History:  Procedure Laterality Date  . CATARACT EXTRACTION, BILATERAL    . HIP PINNING Left 1956  . REPLACEMENT TOTAL KNEE Left 11/12/2009  . TOTAL KNEE REVISION Left 12/07/2016   Procedure: TOTAL KNEE REVISION;  Surgeon: Dereck Leep, MD;  Location: [facility] ORS;  Service: Orthopedics;  Laterality: Left;    Family History  Problem Relation Age of Onset  . Emphysema Father        Smoker  . CAD Mother   . Stroke Mother   . Diabetes Brother   .  Seizures Brother   . Cancer Brother        unknown  . Diabetes Brother     Social History   Tobacco Use  . Smoking status: Former Smoker    Packs/day: 0.25    Years: 1.00    Pack years: 0.25    Types: Cigarettes    Quit date: 1966    Years since quitting: 56.2  . Smokeless tobacco: Never Used  . Tobacco comment: smoking cessation materials not required  Substance Use Topics  . Alcohol use: No    Alcohol/week: 0.0 standard drinks     Current Outpatient Medications:  .  acetaminophen (TYLENOL) 500 MG tablet, Take 1 tablet (500 mg total) by mouth 2 (two) times daily. (Patient taking differently: Take 500 mg by mouth every 6 (six) hours as needed for mild pain.), Disp: 180 tablet, Rfl: 0 .  amLODipine (NORVASC) 2.5 MG tablet, Take 1 tablet (2.5 mg total) by mouth daily., Disp: 90 tablet, Rfl: 1 .  aspirin EC 81 MG tablet, Take 81 mg by mouth., Disp: , Rfl:  .  atorvastatin (LIPITOR) 40 MG tablet, Take 1 tablet (40 mg total) by mouth daily., Disp: 90 tablet, Rfl: 1 .  Calcium-Vitamin D 600-200 MG-UNIT tablet, Take 1 tablet by mouth daily. , Disp: , Rfl:  .  desonide (DESOWEN) 0.05 % cream, Apply topically 2 (two) times daily., Disp: 30 g, Rfl: 0 .  gabapentin (NEURONTIN) 300 MG capsule,  TAKE ONE CAPSULE BY MOUTH TWICE DAILY (Patient taking differently: Take by mouth 2 (two) times daily. Take 300mg  in the morning and 600 mg at bedtime), Disp: 180 capsule, Rfl: 1 .  losartan (COZAAR) 50 MG tablet, Take 1 tablet (50 mg total) by mouth daily., Disp: 90 tablet, Rfl: 1 .  pantoprazole (PROTONIX) 40 MG tablet, Take 1 tablet (40 mg total) by mouth daily., Disp: 90 tablet, Rfl: 1 .  vitamin B-12 (CYANOCOBALAMIN) 1000 MCG tablet, Take 1,000 mcg by mouth daily., Disp: , Rfl:  .  Adapalene (DIFFERIN) 0.3 % gel, Apply a pea-sized amount to affected area face every night as tolerated. (Patient not taking: Reported on 04/22/2020), Disp: 45 g, Rfl: 1 .  baclofen (LIORESAL) 10 MG tablet, Take 1-2  tablets (10-20 mg total) by mouth 3 (three) times daily. (Patient not taking: Reported on 04/22/2020), Disp: 30 each, Rfl: 0 .  fluticasone (FLONASE) 50 MCG/ACT nasal spray, Place into the nose. (Patient not taking: Reported on 04/22/2020), Disp: , Rfl:  .  loratadine (CLARITIN) 10 MG tablet, Take 1 tablet (10 mg total) by mouth daily. (Patient not taking: Reported on 04/22/2020), Disp: 30 tablet, Rfl: 0  Allergies  Allergen Reactions  . Lovastatin Other (See Comments)    chest  . Pravastatin     Other reaction(s): Muscle Pain chest  . Rosuvastatin     Other reaction(s): Muscle Pain chest Other reaction(s): Muscle Pain chest  . Statins     Other reaction(s): Muscle Pain chest    I personally reviewed active problem list, medication list, allergies, family history, social history, health maintenance with the patient/caregiver today.   ROS  Ten systems reviewed and is negative except as mentioned in HPI  Problems with gait, uses a cane   Objective  Vitals:   04/22/20 1110  BP: 128/80  Pulse: 75  Resp: 16  Temp: 98.2 F (36.8 C)  TempSrc: Oral  SpO2: 99%  Weight: 141 lb (64 kg)  Height: 5\' 3"  (1.6 m)    Body mass index is 24.98 kg/m.  Physical Exam  Constitutional: Patient appears well-developed and well-nourished.  No distress.  HEENT: head atraumatic, normocephalic, pupils equal and reactive to light,  neck supple, throat within normal limits Cardiovascular: Normal rate, regular rhythm and normal heart sounds.  No murmur heard. No BLE edema. Pulmonary/Chest: Effort normal and breath sounds normal. No respiratory distress. Abdominal: Soft.  There is no pain during exam   Psychiatric: Patient has a normal mood and affect. behavior is normal. Judgment and thought content normal.  Recent Results (from the past 2160 hour(s))  CBC with Differential/Platelet     Status: Abnormal   Collection Time: 04/05/20 11:18 AM  Result Value Ref Range   WBC 7.8 3.8 - 10.8  Thousand/uL   RBC 5.24 (H) 3.80 - 5.10 Million/uL   Hemoglobin 14.6 11.7 - 15.5 g/dL   HCT 44.7 35.0 - 45.0 %   MCV 85.3 80.0 - 100.0 fL   MCH 27.9 27.0 - 33.0 pg   MCHC 32.7 32.0 - 36.0 g/dL   RDW 13.0 11.0 - 15.0 %   Platelets 293 140 - 400 Thousand/uL   MPV 10.5 7.5 - 12.5 fL   Neutro Abs 5,647 1,500 - 7,800 cells/uL   Lymphs Abs 1,482 850 - 3,900 cells/uL   Absolute Monocytes 546 200 - 950 cells/uL   Eosinophils Absolute 78 15 - 500 cells/uL   Basophils Absolute 47 0 - 200 cells/uL   Neutrophils Relative % 72.4 %  Total Lymphocyte 19.0 %   Monocytes Relative 7.0 %   Eosinophils Relative 1.0 %   Basophils Relative 0.6 %  Sedimentation rate     Status: None   Collection Time: 04/05/20 11:18 AM  Result Value Ref Range   Sed Rate 2 0 - 30 mm/h  C-reactive protein     Status: None   Collection Time: 04/05/20 11:18 AM  Result Value Ref Range   CRP 0.5 <8.0 mg/L  Vitamin B12     Status: Abnormal   Collection Time: 04/05/20 11:18 AM  Result Value Ref Range   Vitamin B-12 1,341 (H) 200 - 1,100 pg/mL  Lipid panel     Status: Abnormal   Collection Time: 04/05/20 11:18 AM  Result Value Ref Range   Cholesterol 113 <200 mg/dL   HDL 44 (L) > OR = 50 mg/dL   Triglycerides 72 <150 mg/dL   LDL Cholesterol (Calc) 54 mg/dL (calc)    Comment: Reference range: <100 . Desirable range <100 mg/dL for primary prevention;   <70 mg/dL for patients with CHD or diabetic patients  with > or = 2 CHD risk factors. [name] LDL-C is now calculated using the Martin-Hopkins  calculation, which is a validated novel method providing  better accuracy than the Friedewald equation in the  estimation of LDL-C.  Cresenciano Genre et al. Annamaria Helling. 9024;097(35): 2061-2068  (http://education.[employer].com/faq/FAQ164)    Total CHOL/HDL Ratio 2.6 <5.0 (calc)   Non-HDL Cholesterol (Calc) 69 <130 mg/dL (calc)    Comment: For patients with diabetes plus 1 major ASCVD risk  factor, treating to a non-HDL-C goal of <100  mg/dL  (LDL-C of <70 mg/dL) is considered a therapeutic  option.   COMPLETE METABOLIC PANEL WITH GFR     Status: None   Collection Time: 04/05/20 11:18 AM  Result Value Ref Range   Glucose, Bld 85 65 - 99 mg/dL    Comment: .            Fasting reference interval .    BUN 14 7 - 25 mg/dL   Creat 0.66 0.60 - 0.88 mg/dL    Comment: For patients >1 years of age, the reference limit for Creatinine is approximately 13% higher for people identified as African-American. .    GFR, Est Non African American 82 > OR = 60 mL/min/1.68m2   GFR, Est African American 95 > OR = 60 mL/min/1.50m2   BUN/Creatinine Ratio NOT APPLICABLE 6 - 22 (calc)   Sodium 144 135 - 146 mmol/L   Potassium 3.9 3.5 - 5.3 mmol/L   Chloride 108 98 - 110 mmol/L   CO2 28 20 - 32 mmol/L   Calcium 9.3 8.6 - 10.4 mg/dL   Total Protein 6.1 6.1 - 8.1 g/dL   Albumin 3.8 3.6 - 5.1 g/dL   Globulin 2.3 1.9 - 3.7 g/dL (calc)   AG Ratio 1.7 1.0 - 2.5 (calc)   Total Bilirubin 0.9 0.2 - 1.2 mg/dL   Alkaline phosphatase (APISO) 113 37 - 153 U/L   AST 16 10 - 35 U/L   ALT 15 6 - 29 U/L  Hemoglobin A1c     Status: Abnormal   Collection Time: 04/05/20 11:18 AM  Result Value Ref Range   Hgb A1c MFr Bld 6.3 (H) <5.7 % of total Hgb    Comment: For someone without known diabetes, a hemoglobin  A1c value between 5.7% and 6.4% is consistent with prediabetes and should be confirmed with a  follow-up test. . For someone with  known diabetes, a value <7% indicates that their diabetes is well controlled. A1c targets should be individualized based on duration of diabetes, age, comorbid conditions, and other considerations. . This assay result is consistent with an increased risk of diabetes. . Currently, no consensus exists regarding use of hemoglobin A1c for diagnosis of diabetes for children. .    Mean Plasma Glucose 134 mg/dL   eAG (mmol/L) 7.4 mmol/L    PHQ2/9: Depression screen [facility] 2/9 04/22/2020 04/05/2020 10/17/2019  10/02/2019 05/31/2019  Decreased Interest 0 0 0 0 0  Down, Depressed, Hopeless 0 0 0 0 0  PHQ - 2 Score 0 0 0 0 0  Altered sleeping - - - 0 0  Tired, decreased energy - - - 0 0  Change in appetite - - - 0 0  Feeling bad or failure about yourself  - - - 0 0  Trouble concentrating - - - 0 0  Moving slowly or fidgety/restless - - - 0 0  Suicidal thoughts - - - 0 0  PHQ-9 Score - - - 0 0  Difficult doing work/chores - - - - -  Some recent data might be hidden    phq 9 is negative   Fall Risk: Fall Risk  04/22/2020 04/05/2020 10/17/2019 10/02/2019 05/31/2019  Falls in the past year? 1 1 0 0 0  Number falls in past yr: 0 0 0 0 0  Injury with Fall? 0 0 0 0 0  Risk for fall due to : - - Impaired vision;Impaired balance/gait - -  Risk for fall due to: Comment - - - - -  Follow up - - Falls prevention discussed - -     Functional Status Survey: Is the patient deaf or have difficulty hearing?: Yes Does the patient have difficulty seeing, even when wearing glasses/contacts?: Yes Does the patient have difficulty concentrating, remembering, or making decisions?: No Does the patient have difficulty walking or climbing stairs?: No Does the patient have difficulty dressing or bathing?: No Does the patient have difficulty doing errands alone such as visiting a doctor's office or shopping?: No    Assessment & Plan  1. Change in bowel movement  - Ambulatory referral to Gastroenterology  2. Black stools  - Ambulatory referral to Gastroenterology

## 2020-04-22 ENCOUNTER — Ambulatory Visit (INDEPENDENT_AMBULATORY_CARE_PROVIDER_SITE_OTHER): Payer: Medicare Other | Admitting: Family Medicine

## 2020-04-22 ENCOUNTER — Encounter: Payer: Self-pay | Admitting: Family Medicine

## 2020-04-22 ENCOUNTER — Other Ambulatory Visit: Payer: Self-pay

## 2020-04-22 VITALS — BP 128/80 | HR 75 | Temp 98.2°F | Resp 16 | Ht 63.0 in | Wt 141.0 lb

## 2020-04-22 DIAGNOSIS — K921 Melena: Secondary | ICD-10-CM | POA: Diagnosis not present

## 2020-04-22 DIAGNOSIS — R198 Other specified symptoms and signs involving the digestive system and abdomen: Secondary | ICD-10-CM | POA: Diagnosis not present

## 2020-04-23 ENCOUNTER — Telehealth: Payer: Self-pay

## 2020-04-23 ENCOUNTER — Ambulatory Visit: Payer: Medicare Other | Admitting: Family Medicine

## 2020-04-23 NOTE — Telephone Encounter (Signed)
Patient's Adapalene was denied by [employer]. Denied in media (pts needs to try and fail OTC Differin). Left message for patient to return my call.   Patient reported to PCP that she wasn't taking as of yesterday 04/22/20.

## 2020-04-24 NOTE — Telephone Encounter (Signed)
Patient advised to try and fail over the counter Differin per insurance denial.

## 2020-04-29 ENCOUNTER — Ambulatory Visit (INDEPENDENT_AMBULATORY_CARE_PROVIDER_SITE_OTHER): Payer: Medicare Other

## 2020-04-29 DIAGNOSIS — K921 Melena: Secondary | ICD-10-CM

## 2020-04-29 DIAGNOSIS — R198 Other specified symptoms and signs involving the digestive system and abdomen: Secondary | ICD-10-CM

## 2020-04-29 LAB — POC HEMOCCULT BLD/STL (HOME/3-CARD/SCREEN)
Card #1 Date: 3142022
Card #2 Date: 3152022
Card #2 Fecal Occult Blod, POC: NEGATIVE
Card #3 Date: 3172022
Card #3 Fecal Occult Blood, POC: NEGATIVE
Fecal Occult Blood, POC: NEGATIVE

## 2020-05-02 DIAGNOSIS — E119 Type 2 diabetes mellitus without complications: Secondary | ICD-10-CM | POA: Diagnosis not present

## 2020-05-02 DIAGNOSIS — Z96652 Presence of left artificial knee joint: Secondary | ICD-10-CM | POA: Diagnosis not present

## 2020-05-14 ENCOUNTER — Ambulatory Visit: Payer: Medicare Other

## 2020-05-29 ENCOUNTER — Other Ambulatory Visit: Payer: Self-pay | Admitting: Family Medicine

## 2020-05-29 DIAGNOSIS — E782 Mixed hyperlipidemia: Secondary | ICD-10-CM

## 2020-06-07 ENCOUNTER — Telehealth: Payer: Self-pay | Admitting: Family Medicine

## 2020-06-07 NOTE — Telephone Encounter (Signed)
Pt is calling to reschedule nurse visit. Please advise. Cb- [phone]

## 2020-06-07 NOTE — Telephone Encounter (Signed)
Lvm to let patient know that she does not need and nurse visit for a Hemoccult card. She just picks it up and then bringfs it back to the office.

## 2020-06-13 ENCOUNTER — Encounter: Payer: Self-pay | Admitting: Gastroenterology

## 2020-06-13 ENCOUNTER — Other Ambulatory Visit: Payer: Self-pay

## 2020-06-13 ENCOUNTER — Ambulatory Visit: Payer: Medicare Other | Admitting: Gastroenterology

## 2020-06-13 VITALS — BP 167/82 | HR 84 | Temp 98.5°F | Ht 63.0 in | Wt 144.0 lb

## 2020-06-13 DIAGNOSIS — R194 Change in bowel habit: Secondary | ICD-10-CM

## 2020-06-13 DIAGNOSIS — R1032 Left lower quadrant pain: Secondary | ICD-10-CM

## 2020-06-13 MED ORDER — NA SULFATE-K SULFATE-MG SULF 17.5-3.13-1.6 GM/177ML PO SOLN: 354.0000 mL | Freq: Once | ORAL | 0 refills | Status: AC

## 2020-06-13 NOTE — Progress Notes (Signed)
Cephas Darby, MD [address]  [place]  [place], [place] [address]  Main: [phone]  Fax: [phone]    Gastroenterology Consultation  Referring Provider:     Steele Sizer, MD Primary Care Physician:  Steele Sizer, MD Primary Gastroenterologist:  Dr. Cephas Darby Reason for Consultation:     Altered bowel habits        HPI:   Catherine Burgess is a 85 y.o. female referred by Dr. Steele Sizer, MD  for consultation & management of altered bowel habits.  Patient noticed change in stool consistency about a month ago when she saw Dr. [name].  Her stools were soft and sometimes hard associated with left lower quadrant discomfort and bloating.  Patient added more vegetables in her diet which has helped with improvement in her symptoms.  She sometimes has occasional diarrhea.  Otherwise, her stools are mostly formed.  She denies any rectal bleeding.  Her labs are unremarkable.  She is independent, still working, physically active.  Her son lives next door.  She does not smoke or drink alcohol  NSAIDs: None  Antiplts/Anticoagulants/Anti thrombotics: None  GI Procedures: Colonoscopy more than 10 years ago, reportedly normal  Past Medical History:  Diagnosis Date  . Degenerative joint disease   . GERD (gastroesophageal reflux disease)   . Hyperlipidemia   . Hypertension   . Malignant essential hypertension 07/23/2016  . Osteoarthrosis   . Reflux esophagitis     Past Surgical History:  Procedure Laterality Date  . CATARACT EXTRACTION, BILATERAL    . HIP PINNING Left 1956  . REPLACEMENT TOTAL KNEE Left 11/12/2009  . TOTAL KNEE REVISION Left 12/07/2016   Procedure: TOTAL KNEE REVISION;  Surgeon: Dereck Leep, MD;  Location: [facility] ORS;  Service: Orthopedics;  Laterality: Left;    Current Outpatient Medications:  .  acetaminophen (TYLENOL) 500 MG tablet, Take 1 tablet (500 mg total) by mouth 2 (two) times daily. (Patient taking differently: Take 500 mg by  mouth every 6 (six) hours as needed for mild pain.), Disp: 180 tablet, Rfl: 0 .  amLODipine (NORVASC) 2.5 MG tablet, Take 1 tablet (2.5 mg total) by mouth daily., Disp: 90 tablet, Rfl: 1 .  aspirin EC 81 MG tablet, Take 81 mg by mouth., Disp: , Rfl:  .  atorvastatin (LIPITOR) 40 MG tablet, Take 1 tablet (40 mg total) by mouth daily., Disp: 90 tablet, Rfl: 1 .  Calcium-Vitamin D 600-200 MG-UNIT tablet, Take 1 tablet by mouth daily. , Disp: , Rfl:  .  desonide (DESOWEN) 0.05 % cream, Apply topically 2 (two) times daily., Disp: 30 g, Rfl: 0 .  fluticasone (FLONASE) 50 MCG/ACT nasal spray, Place into the nose., Disp: , Rfl:  .  gabapentin (NEURONTIN) 300 MG capsule, TAKE ONE CAPSULE BY MOUTH TWICE DAILY (Patient taking differently: Take by mouth 2 (two) times daily. Take 300mg  in the morning and 600 mg at bedtime), Disp: 180 capsule, Rfl: 1 .  loratadine (CLARITIN) 10 MG tablet, Take 1 tablet (10 mg total) by mouth daily., Disp: 30 tablet, Rfl: 0 .  losartan (COZAAR) 50 MG tablet, Take 1 tablet (50 mg total) by mouth daily., Disp: 90 tablet, Rfl: 1 .  Na Sulfate-K Sulfate-Mg Sulf 17.5-3.13-1.6 GM/177ML SOLN, Take 354 mLs by mouth once for 1 dose., Disp: 354 mL, Rfl: 0 .  pantoprazole (PROTONIX) 40 MG tablet, Take 1 tablet (40 mg total) by mouth daily., Disp: 90 tablet, Rfl: 1 .  vitamin B-12 (CYANOCOBALAMIN) 1000 MCG tablet,  Take 1,000 mcg by mouth daily., Disp: , Rfl:    Family History  Problem Relation Age of Onset  . Emphysema Father        Smoker  . CAD Mother   . Stroke Mother   . Diabetes Brother   . Seizures Brother   . Cancer Brother        unknown  . Diabetes Brother      Social History   Tobacco Use  . Smoking status: Former Smoker    Packs/day: 0.25    Years: 1.00    Pack years: 0.25    Types: Cigarettes    Quit date: 1966    Years since quitting: 56.3  . Smokeless tobacco: Never Used  . Tobacco comment: smoking cessation materials not required  Vaping Use  . Vaping  Use: Never used  Substance Use Topics  . Alcohol use: No    Alcohol/week: 0.0 standard drinks  . Drug use: No    Allergies as of 06/13/2020 - Review Complete 06/13/2020  Allergen Reaction Noted  . Lovastatin Other (See Comments) 07/31/2014  . Pravastatin  03/28/2015  . Rosuvastatin  07/31/2014  . Statins  07/31/2014    Review of Systems:    All systems reviewed and negative except where noted in HPI.   Physical Exam:  BP (!) 167/82 (BP Location: Left Arm, Patient Position: Sitting, Cuff Size: Normal)   Pulse 84   Temp 98.5 F (36.9 C) (Oral)   Ht 5\' 3"  (1.6 m)   Wt 144 lb (65.3 kg)   BMI 25.51 kg/m  No LMP recorded. Patient is postmenopausal.  General:   Alert,  Well-developed, well-nourished, pleasant and cooperative in NAD Head:  Normocephalic and atraumatic. Eyes:  Sclera clear, no icterus.   Conjunctiva pink. Ears:  Normal auditory acuity. Nose:  No deformity, discharge, or lesions. Mouth:  No deformity or lesions,oropharynx pink & moist. Neck:  Supple; no masses or thyromegaly. Lungs:  Respirations even and unlabored.  Clear throughout to auscultation.   No wheezes, crackles, or rhonchi. No acute distress. Heart:  Regular rate and rhythm; no murmurs, clicks, rubs, or gallops. Abdomen:  Normal bowel sounds. Soft, non-tender and non-distended without masses, hepatosplenomegaly or hernias noted.  No guarding or rebound tenderness.   Rectal: Not performed Msk:  Symmetrical without gross deformities. Good, equal movement & strength bilaterally. Pulses:  Normal pulses noted. Extremities:  No clubbing or edema.  No cyanosis. Neurologic:  Alert and oriented x3;  grossly normal neurologically. Skin:  Intact without significant lesions or rashes. No jaundice. Psych:  Alert and cooperative. Normal mood and affect.  Imaging Studies: Reviewed  Assessment and Plan:   Catherine Burgess is a 85 y.o. pleasant African-American female with history of hypertension, sigmoid  diverticulosis seen in consultation for altered bowel habits and left lower quadrant discomfort  Recommend diagnostic colonoscopy as patient is overall functionally independent and fairly healthy to rule out any space-occupying lesions and her last colonoscopy was more than 10 years ago Encouraged to continue high-fiber diet, adequate intake of water, avoid frequent snacking of processed foods   Follow up as needed   Cephas Darby, MD

## 2020-06-17 ENCOUNTER — Telehealth: Payer: Self-pay

## 2020-06-17 NOTE — Telephone Encounter (Signed)
Copied from [place] [id number]. Topic: General - Other >> Jun 17, 2020  8:57 AM Leward Quan A wrote: Reason for CRM: Fraser Din with [employer] called in to inform Dr [name] that there was a Coralee Rud test that was done and report will follow. Ph# [phone]

## 2020-06-19 ENCOUNTER — Encounter: Payer: Self-pay | Admitting: Gastroenterology

## 2020-06-20 ENCOUNTER — Ambulatory Visit: Payer: Medicare Other | Admitting: Anesthesiology

## 2020-06-20 ENCOUNTER — Encounter: Payer: Self-pay | Admitting: Gastroenterology

## 2020-06-20 ENCOUNTER — Other Ambulatory Visit: Payer: Self-pay

## 2020-06-20 ENCOUNTER — Encounter
Admission: RE | Disposition: A | Discharge status: Home / Self Care | Payer: Self-pay | Source: Home / Self Care | Attending: Gastroenterology

## 2020-06-20 ENCOUNTER — Ambulatory Visit
Admission: RE | Admit: 2020-06-20 | Discharge: 2020-06-20 | Disposition: A | Discharge status: Home / Self Care | Payer: Medicare Other | Attending: Gastroenterology | Admitting: Gastroenterology

## 2020-06-20 DIAGNOSIS — K219 Gastro-esophageal reflux disease without esophagitis: Secondary | ICD-10-CM | POA: Diagnosis not present

## 2020-06-20 DIAGNOSIS — Z96652 Presence of left artificial knee joint: Secondary | ICD-10-CM | POA: Diagnosis not present

## 2020-06-20 DIAGNOSIS — Z79899 Other long term (current) drug therapy: Secondary | ICD-10-CM | POA: Diagnosis not present

## 2020-06-20 DIAGNOSIS — K5730 Diverticulosis of large intestine without perforation or abscess without bleeding: Secondary | ICD-10-CM | POA: Diagnosis not present

## 2020-06-20 DIAGNOSIS — Z7982 Long term (current) use of aspirin: Secondary | ICD-10-CM | POA: Insufficient documentation

## 2020-06-20 DIAGNOSIS — Z888 Allergy status to other drugs, medicaments and biological substances status: Secondary | ICD-10-CM | POA: Diagnosis not present

## 2020-06-20 DIAGNOSIS — Z87891 Personal history of nicotine dependence: Secondary | ICD-10-CM | POA: Insufficient documentation

## 2020-06-20 DIAGNOSIS — R194 Change in bowel habit: Secondary | ICD-10-CM | POA: Diagnosis not present

## 2020-06-20 DIAGNOSIS — M170 Bilateral primary osteoarthritis of knee: Secondary | ICD-10-CM | POA: Diagnosis not present

## 2020-06-20 HISTORY — PX: COLONOSCOPY WITH PROPOFOL: SHX5780

## 2020-06-20 SURGERY — COLONOSCOPY WITH PROPOFOL
Anesthesia: General

## 2020-06-20 MED ORDER — PROPOFOL 10 MG/ML IV BOLUS
INTRAVENOUS | Status: DC | PRN
  Administered 2020-06-20: 50 mg via INTRAVENOUS

## 2020-06-20 MED ORDER — PROPOFOL 500 MG/50ML IV EMUL
INTRAVENOUS | Status: DC | PRN
  Administered 2020-06-20: 100 ug/kg/min via INTRAVENOUS

## 2020-06-20 MED ORDER — LIDOCAINE 2% (20 MG/ML) 5 ML SYRINGE
INTRAMUSCULAR | Status: DC | PRN
  Administered 2020-06-20: 25 mg via INTRAVENOUS

## 2020-06-20 MED ORDER — SODIUM CHLORIDE 0.9 % IV SOLN: INTRAVENOUS | Status: DC

## 2020-06-20 NOTE — Op Note (Signed)
[facility] Gastroenterology Patient Name: Catherine Burgess Procedure Date: 06/20/2020 11:32 AM MRN: [id number] Account #: [id number] Date of Birth: Mar 17, 1935 Admit Type: Outpatient Age: 85 Room: [facility] ENDO ROOM 4 Gender: Female Note Status: Finalized Procedure:             Colonoscopy Indications:           Last colonoscopy: April 2009, Change in bowel habits Providers:             Lin Landsman MD, MD Referring MD:          Bethena Roys. Sowles, MD (Referring MD) Medicines:             General Anesthesia Complications:         No immediate complications. Estimated blood loss: None. Procedure:             Pre-Anesthesia Assessment:                        - Prior to the procedure, a History and Physical was                         performed, and patient medications and allergies were                         reviewed. The patient is competent. The risks and                         benefits of the procedure and the sedation options and                         risks were discussed with the patient. All questions                         were answered and informed consent was obtained.                         Patient identification and proposed procedure were                         verified by the physician, the nurse, the                         anesthesiologist, the anesthetist and the technician                         in the pre-procedure area in the procedure room in the                         endoscopy suite. Mental Status Examination: alert and                         oriented. Airway Examination: normal oropharyngeal                         airway and neck mobility. Respiratory Examination:                         clear to auscultation. CV Examination: normal.  Prophylactic Antibiotics: The patient does not require                         prophylactic antibiotics. Prior Anticoagulants: The                         patient has taken no  previous anticoagulant or                         antiplatelet agents. ASA Grade Assessment: III - A                         patient with severe systemic disease. After reviewing                         the risks and benefits, the patient was deemed in                         satisfactory condition to undergo the procedure. The                         anesthesia plan was to use general anesthesia.                         Immediately prior to administration of medications,                         the patient was re-assessed for adequacy to receive                         sedatives. The heart rate, respiratory rate, oxygen                         saturations, blood pressure, adequacy of pulmonary                         ventilation, and response to care were monitored                         throughout the procedure. The physical status of the                         patient was re-assessed after the procedure.                        After obtaining informed consent, the colonoscope was                         passed under direct vision. Throughout the procedure,                         the patient's blood pressure, pulse, and oxygen                         saturations were monitored continuously. The                         Colonoscope was introduced through the anus and  advanced to the the cecum, identified by appendiceal                         orifice and ileocecal valve. The colonoscopy was                         performed without difficulty. The patient tolerated                         the procedure well. The quality of the bowel                         preparation was fair. Findings:      The perianal and digital rectal examinations were normal. Pertinent       negatives include normal sphincter tone and no palpable rectal lesions.      Multiple diverticula were found in the sigmoid colon, descending colon       and ascending colon. There was no evidence of  diverticular bleeding.      Normal mucosa was found in the entire colon.      The retroflexed view of the distal rectum and anal verge was normal and       showed no anal or rectal abnormalities.      The exam was otherwise without abnormality. Impression:            - Preparation of the colon was fair.                        - Severe diverticulosis in the sigmoid colon, in the                         descending colon and in the ascending colon. There was                         no evidence of diverticular bleeding.                        - Normal mucosa in the entire examined colon.                        - The distal rectum and anal verge are normal on                         retroflexion view.                        - The examination was otherwise normal.                        - No specimens collected. Recommendation:        - Discharge patient to home (with escort).                        - Resume previous diet today.                        - Continue present medications.                        - High fiber  diet. Procedure Code(s):     --- Professional ---                        [id number], Colonoscopy, flexible; diagnostic, including                         collection of specimen(s) by brushing or washing, when                         performed (separate procedure) Diagnosis Code(s):     --- Professional ---                        R19.4, Change in bowel habit                        K57.30, Diverticulosis of large intestine without                         perforation or abscess without bleeding CPT copyright 2019 American Medical Association. All rights reserved. The codes documented in this report are preliminary and upon coder review may  be revised to meet current compliance requirements. Dr. Ulyess Mort Lin Landsman MD, MD 06/20/2020 11:57:00 AM This report has been signed electronically. Number of Addenda: 0 Note Initiated On: 06/20/2020 11:32 AM Scope Withdrawal Time: 0  hours 11 minutes 33 seconds  Total Procedure Duration: 0 hours 15 minutes 48 seconds  Estimated Blood Loss:  Estimated blood loss: none.      [facility]

## 2020-06-20 NOTE — Transfer of Care (Signed)
Immediate Anesthesia Transfer of Care Note  Patient: Catherine Burgess  Procedure(s) Performed: COLONOSCOPY WITH PROPOFOL (N/A )  Patient Location: Endoscopy Unit  Anesthesia Type:General  Level of Consciousness: awake  Airway & Oxygen Therapy: Patient Spontanous Breathing  Post-op Assessment: Report given to RN and Post -op Vital signs reviewed and stable  Post vital signs: Reviewed  Last Vitals:  Vitals Value Taken Time  BP    Temp    Pulse 73 06/20/20 1157  Resp 20 06/20/20 1157  SpO2 100 % 06/20/20 1157  Vitals shown include unvalidated device data.  Last Pain:  Vitals:   06/20/20 0938  TempSrc: Temporal  PainSc: 0-No pain         Complications: No complications documented.

## 2020-06-20 NOTE — H&P (Signed)
Catherine Darby, MD [address]  [place]  [place], [place] [address]  Main: [phone]  Fax: [phone] Pager: [phone]  Primary Care Physician:  Steele Sizer, MD Primary Gastroenterologist:  Dr. Cephas Burgess  Pre-Procedure History & Physical: HPI:  Catherine Burgess is a 85 y.o. female is here for an colonoscopy.   Past Medical History:  Diagnosis Date  . Degenerative joint disease   . GERD (gastroesophageal reflux disease)   . Hyperlipidemia   . Hypertension   . Malignant essential hypertension 07/23/2016  . Osteoarthrosis   . Reflux esophagitis     Past Surgical History:  Procedure Laterality Date  . CATARACT EXTRACTION, BILATERAL    . EYE SURGERY    . HIP PINNING Left 1956  . JOINT REPLACEMENT    . REPLACEMENT TOTAL KNEE Left 11/12/2009  . TOTAL KNEE REVISION Left 12/07/2016   Procedure: TOTAL KNEE REVISION;  Surgeon: Dereck Leep, MD;  Location: [facility] ORS;  Service: Orthopedics;  Laterality: Left;    Prior to Admission medications   Medication Sig Start Date End Date Taking? Authorizing Provider  amLODipine (NORVASC) 2.5 MG tablet Take 1 tablet (2.5 mg total) by mouth daily. 04/05/20  Yes Steele Sizer, MD  aspirin EC 81 MG tablet Take 81 mg by mouth.   Yes [provider]  atorvastatin (LIPITOR) 40 MG tablet Take 1 tablet (40 mg total) by mouth daily. 04/05/20  Yes Steele Sizer, MD  Calcium-Vitamin D 600-200 MG-UNIT tablet Take 1 tablet by mouth daily.    Yes [provider]  gabapentin (NEURONTIN) 300 MG capsule TAKE ONE CAPSULE BY MOUTH TWICE DAILY Patient taking differently: Take by mouth 2 (two) times daily. Take 300mg  in the morning and 600 mg at bedtime 06/05/18  Yes Sowles, [name], MD  losartan (COZAAR) 50 MG tablet Take 1 tablet (50 mg total) by mouth daily. 04/05/20  Yes Sowles, [name], MD  pantoprazole (PROTONIX) 40 MG tablet Take 1 tablet (40 mg total) by mouth daily. 04/05/20  Yes Sowles, [name], MD   acetaminophen (TYLENOL) 500 MG tablet Take 1 tablet (500 mg total) by mouth 2 (two) times daily. Patient taking differently: Take 500 mg by mouth every 6 (six) hours as needed for mild pain. 10/28/15   Steele Sizer, MD  desonide (DESOWEN) 0.05 % cream Apply topically 2 (two) times daily. 04/05/20   Steele Sizer, MD  fluticasone (FLONASE) 50 MCG/ACT nasal spray Place into the nose. Patient not taking: Reported on 06/20/2020 01/09/18   [provider]  loratadine (CLARITIN) 10 MG tablet Take 1 tablet (10 mg total) by mouth daily. Patient not taking: Reported on 06/20/2020 05/31/19   Steele Sizer, MD  vitamin B-12 (CYANOCOBALAMIN) 1000 MCG tablet Take 1,000 mcg by mouth daily. Patient not taking: Reported on 06/20/2020    [provider]    Allergies as of 06/13/2020 - Review Complete 06/13/2020  Allergen Reaction Noted  . Lovastatin Other (See Comments) 07/31/2014  . Pravastatin  03/28/2015  . Rosuvastatin  07/31/2014  . Statins  07/31/2014    Family History  Problem Relation Age of Onset  . Emphysema Father        Smoker  . CAD Mother   . Stroke Mother   . Diabetes Brother   . Seizures Brother   . Cancer Brother        unknown  . Diabetes Brother     Social History   Socioeconomic History  . Marital status: Divorced    Spouse  name: Not on file  . Number of children: 1  . Years of education: Not on file  . Highest education level: 12th grade  Occupational History  . Occupation: Retired  Tobacco Use  . Smoking status: Former Smoker    Packs/day: 0.25    Years: 1.00    Pack years: 0.25    Types: Cigarettes    Quit date: 1966    Years since quitting: 56.3  . Smokeless tobacco: Never Used  . Tobacco comment: smoking cessation materials not required  Vaping Use  . Vaping Use: Never used  Substance and Sexual Activity  . Alcohol use: No    Alcohol/week: 0.0 standard drinks  . Drug use: No  . Sexual activity: Not Currently  Other Topics Concern   . Not on file  Social History Narrative   Pt lives alone.    Social Determinants of Health   Financial Resource Strain: Low Risk   . Difficulty of Paying Living Expenses: Not hard at all  Food Insecurity: No Food Insecurity  . Worried About Charity fundraiser in the Last Year: Never true  . Ran Out of Food in the Last Year: Never true  Transportation Needs: No Transportation Needs  . Lack of Transportation (Medical): No  . Lack of Transportation (Non-Medical): No  Physical Activity: Inactive  . Days of Exercise per Week: 0 days  . Minutes of Exercise per Session: 0 min  Stress: No Stress Concern Present  . Feeling of Stress : Not at all  Social Connections: Moderately Integrated  . Frequency of Communication with Friends and Family: More than three times a week  . Frequency of Social Gatherings with Friends and Family: More than three times a week  . Attends Religious Services: More than 4 times per year  . Active Member of Clubs or Organizations: Yes  . Attends Archivist Meetings: Never  . Marital Status: Divorced  Human resources officer Violence: Not At Risk  . Fear of Current or Ex-Partner: No  . Emotionally Abused: No  . Physically Abused: No  . Sexually Abused: No    Review of Systems: See HPI, otherwise negative ROS  Physical Exam: BP 138/65   Pulse 90   Temp (!) 97.3 F (36.3 C) (Temporal)   Resp 20   Ht 5\' 3"  (1.6 m)   Wt 64.9 kg   SpO2 100%   BMI 25.33 kg/m  General:   Alert,  pleasant and cooperative in NAD Head:  Normocephalic and atraumatic. Neck:  Supple; no masses or thyromegaly. Lungs:  Clear throughout to auscultation.    Heart:  Regular rate and rhythm. Abdomen:  Soft, nontender and nondistended. Normal bowel sounds, without guarding, and without rebound.   Neurologic:  Alert and  oriented x4;  grossly normal neurologically.  Impression/Plan: Catherine Burgess is here for an colonoscopy to be performed for altered bowel  habits  Risks, benefits, limitations, and alternatives regarding  colonoscopy have been reviewed with the patient.  Questions have been answered.  All parties agreeable.   Sherri Sear, MD  06/20/2020, 11:28 AM

## 2020-06-20 NOTE — Anesthesia Preprocedure Evaluation (Signed)
Anesthesia Evaluation  Patient identified by MRN, date of birth, ID band Patient awake    Reviewed: Allergy & Precautions, H&P , NPO status , Patient's Chart, lab work & pertinent test results, reviewed documented beta blocker date and time   Airway Mallampati: II   Neck ROM: full    Dental  (+) Poor Dentition   Pulmonary neg pulmonary ROS, former smoker,    Pulmonary exam normal        Cardiovascular Exercise Tolerance: Good hypertension, On Medications negative cardio ROS Normal cardiovascular exam Rhythm:regular Rate:Normal     Neuro/Psych negative neurological ROS  negative psych ROS   GI/Hepatic Neg liver ROS, GERD  Medicated,  Endo/Other  negative endocrine ROS  Renal/GU negative Renal ROS  negative genitourinary   Musculoskeletal   Abdominal   Peds  Hematology negative hematology ROS (+)   Anesthesia Other Findings Past Medical History: No date: Degenerative joint disease No date: GERD (gastroesophageal reflux disease) No date: Hyperlipidemia No date: Hypertension 07/23/2016: Malignant essential hypertension No date: Osteoarthrosis No date: Reflux esophagitis Past Surgical History: No date: CATARACT EXTRACTION, BILATERAL No date: EYE SURGERY 1956: HIP PINNING; Left No date: JOINT REPLACEMENT 11/12/2009: REPLACEMENT TOTAL KNEE; Left 12/07/2016: TOTAL KNEE REVISION; Left     Comment:  Procedure: TOTAL KNEE REVISION;  Surgeon: Dereck Leep, MD;  Location: [facility] ORS;  Service: Orthopedics;                Laterality: Left; BMI    Body Mass Index: 25.33 kg/m     Reproductive/Obstetrics negative OB ROS                             Anesthesia Physical Anesthesia Plan  ASA: III  Anesthesia Plan: General   Post-op Pain Management:    Induction:   PONV Risk Score and Plan:   Airway Management Planned:   Additional Equipment:   Intra-op Plan:    Post-operative Plan:   Informed Consent: I have reviewed the patients History and Physical, chart, labs and discussed the procedure including the risks, benefits and alternatives for the proposed anesthesia with the patient or authorized representative who has indicated his/her understanding and acceptance.     Dental Advisory Given  Plan Discussed with: CRNA  Anesthesia Plan Comments:         Anesthesia Quick Evaluation

## 2020-06-21 ENCOUNTER — Encounter: Payer: Self-pay | Admitting: Gastroenterology

## 2020-06-22 ENCOUNTER — Other Ambulatory Visit: Payer: Self-pay | Admitting: Family Medicine

## 2020-06-22 DIAGNOSIS — E782 Mixed hyperlipidemia: Secondary | ICD-10-CM

## 2020-06-22 NOTE — Anesthesia Postprocedure Evaluation (Signed)
Anesthesia Post Note  Patient: Catherine Burgess  Procedure(s) Performed: COLONOSCOPY WITH PROPOFOL (N/A )  Patient location during evaluation: PACU Anesthesia Type: General Level of consciousness: awake and alert Pain management: pain level controlled Vital Signs Assessment: post-procedure vital signs reviewed and stable Respiratory status: spontaneous breathing, nonlabored ventilation, respiratory function stable and patient connected to nasal cannula oxygen Cardiovascular status: blood pressure returned to baseline and stable Postop Assessment: no apparent nausea or vomiting Anesthetic complications: no   No complications documented.   Last Vitals:  Vitals:   06/20/20 1210 06/20/20 1230  BP: (!) 143/84   Pulse: 77   Resp: 15   Temp:    SpO2: 100% 100%    Last Pain:  Vitals:   06/20/20 1150  TempSrc: Temporal  PainSc:                  Molli Barrows

## 2020-06-22 NOTE — Telephone Encounter (Signed)
Requested Prescriptions  Pending Prescriptions Disp Refills  . atorvastatin (LIPITOR) 40 MG tablet [Pharmacy Med Name: ATORVASTATIN 40MG  TABLETS] 90 tablet 0    Sig: TAKE 1 TABLET(40 MG) BY MOUTH DAILY     Cardiovascular:  Antilipid - Statins Failed - 06/22/2020  5:25 PM      Failed - HDL in normal range and within 360 days    HDL  Date Value Ref Range Status  04/05/2020 44 (L) > OR = 50 mg/dL Final  01/15/2015 41 >39 mg/dL Final         Passed - Total Cholesterol in normal range and within 360 days    Cholesterol, Total  Date Value Ref Range Status  01/15/2015 112 100 - 199 mg/dL Final   Cholesterol  Date Value Ref Range Status  04/05/2020 113 <200 mg/dL Final         Passed - LDL in normal range and within 360 days    LDL Cholesterol (Calc)  Date Value Ref Range Status  04/05/2020 54 mg/dL (calc) Final    Comment:    Reference range: <100 . Desirable range <100 mg/dL for primary prevention;   <70 mg/dL for patients with CHD or diabetic patients  with > or = 2 CHD risk factors. [name] LDL-C is now calculated using the Martin-Hopkins  calculation, which is a validated novel method providing  better accuracy than the Friedewald equation in the  estimation of LDL-C.  Cresenciano Genre et al. Annamaria Helling. 6073;710(62): 2061-2068  (http://education.[employer].com/faq/FAQ164)          Passed - Triglycerides in normal range and within 360 days    Triglycerides  Date Value Ref Range Status  04/05/2020 72 <150 mg/dL Final         Passed - Patient is not pregnant      Passed - Valid encounter within last 12 months    Recent Outpatient Visits          2 months ago Change in bowel movement   [facility] Steele Sizer, MD   2 months ago Hyperglycemia   [facility] Steele Sizer, MD   8 months ago Calcified cerebral meningioma [facility])   [facility] Steele Sizer, MD   1 year ago Rash in adult   [facility] Steele Sizer, MD   1 year ago Essential hypertension   [facility] Steele Sizer, MD      Future Appointments            In 2 weeks Steele Sizer, MD [facility] [place]   In 3 months  [facility], [facility]

## 2020-07-12 ENCOUNTER — Telehealth (INDEPENDENT_AMBULATORY_CARE_PROVIDER_SITE_OTHER): Payer: Medicare Other | Admitting: Family Medicine

## 2020-07-12 ENCOUNTER — Encounter: Payer: Self-pay | Admitting: Family Medicine

## 2020-07-12 DIAGNOSIS — U071 COVID-19: Secondary | ICD-10-CM | POA: Diagnosis not present

## 2020-07-12 DIAGNOSIS — Z8673 Personal history of transient ischemic attack (TIA), and cerebral infarction without residual deficits: Secondary | ICD-10-CM

## 2020-07-12 DIAGNOSIS — R059 Cough, unspecified: Secondary | ICD-10-CM

## 2020-07-12 MED ORDER — BENZONATATE 100 MG PO CAPS: 100.0000 mg | ORAL_CAPSULE | Freq: Two times a day (BID) | ORAL | 0 refills | Status: DC | PRN

## 2020-07-12 MED ORDER — PAXLOVID 20 X 150 MG & 10 X 100MG PO TBPK: 3.0000 | ORAL_TABLET | Freq: Two times a day (BID) | ORAL | 0 refills | Status: DC

## 2020-07-12 NOTE — Progress Notes (Signed)
Name: Catherine Burgess   MRN: [id number]    DOB: [birth year]   Date:07/12/2020       Progress Note  Subjective  Chief Complaint  COVID Positive  I connected with  Catherine Burgess  on 07/12/20 at  1:20 PM EDT by a video enabled telemedicine application and verified that I am speaking with the correct person using two identifiers.  I discussed the limitations of evaluation and management by telemedicine and the availability of in person appointments. The patient expressed understanding and agreed to proceed with the virtual visit  Staff also discussed with the patient that there may be a patient responsible charge related to this service. Patient Location: at home  Provider Location: [facility] Additional Individuals present: alone   HPI  COVID-19: she went out with her son and his children on Monday. He was feeling tired the following day he tested positive at work. She was tested on Tuesday and test was negative. She got re-tested yesterday , she developed body aches, followed by a hacking cough, also some chest discomfort, she also had a headache. She states she slept almost all day yesterday . She denies sob or wheezing. The cough is productive today, white sputum. She denies fever. She states her appetite is getting back to normal today   Yesterday pulse ox was 91, heart rate 101, bp 138/71 ( it was checked at her work)  She had COVID-19 vaccines  She is high risk based on personal history of CVA also is 85 yo    Patient Active Problem List   Diagnosis Date Noted  . Altered bowel habits   . Pre-diabetes 10/02/2018  . Abnormal ankle brachial index (ABI) 03/02/2018  . Calcified cerebral meningioma ([place]) 10/04/2017  . History of CVA (cerebrovascular accident) without residual deficits 10/04/2017  . Subacromial impingement of right shoulder 10/04/2017  . Volvulus ([place])   . History of anemia 10/13/2016  . Syncope 07/23/2016  . Hypokalemia 07/23/2016  . Abnormal CT of brain 07/23/2016   . Vitamin D deficiency 10/28/2015  . Post menopausal syndrome 10/28/2015  . Osteopenia after menopause 10/28/2015  . Cervical disc disease 10/28/2015  . H/O total knee replacement 01/20/2015  . Vaginal atrophy 09/26/2014  . Osteoarthritis of both knees 07/31/2014  . GERD without esophagitis 07/31/2014  . HLD (hyperlipidemia) 07/31/2014  . Hypertension 07/31/2014    Past Surgical History:  Procedure Laterality Date  . CATARACT EXTRACTION, BILATERAL    . COLONOSCOPY WITH PROPOFOL N/A 06/20/2020   Procedure: COLONOSCOPY WITH PROPOFOL;  Surgeon: Lin Landsman, MD;  Location: [facility] ENDOSCOPY;  Service: Gastroenterology;  Laterality: N/A;  . EYE SURGERY    . HIP PINNING Left 1956  . JOINT REPLACEMENT    . REPLACEMENT TOTAL KNEE Left 11/12/2009  . TOTAL KNEE REVISION Left 12/07/2016   Procedure: TOTAL KNEE REVISION;  Surgeon: Dereck Leep, MD;  Location: [facility] ORS;  Service: Orthopedics;  Laterality: Left;    Family History  Problem Relation Age of Onset  . Emphysema Father        Smoker  . CAD Mother   . Stroke Mother   . Diabetes Brother   . Seizures Brother   . Cancer Brother        unknown  . Diabetes Brother     Social History   Socioeconomic History  . Marital status: Divorced    Spouse name: Not on file  . Number of children: 1  . Years of education: Not on file  .  Highest education level: 12th grade  Occupational History  . Occupation: Retired  Tobacco Use  . Smoking status: Former Smoker    Packs/day: 0.25    Years: 1.00    Pack years: 0.25    Types: Cigarettes    Quit date: 1966    Years since quitting: 56.4  . Smokeless tobacco: Never Used  . Tobacco comment: smoking cessation materials not required  Vaping Use  . Vaping Use: Never used  Substance and Sexual Activity  . Alcohol use: No    Alcohol/week: 0.0 standard drinks  . Drug use: No  . Sexual activity: Not Currently  Other Topics Concern  . Not on file  Social History Narrative   Pt  lives alone.    Social Determinants of Health   Financial Resource Strain: Low Risk   . Difficulty of Paying Living Expenses: Not hard at all  Food Insecurity: No Food Insecurity  . Worried About Charity fundraiser in the Last Year: Never true  . Ran Out of Food in the Last Year: Never true  Transportation Needs: No Transportation Needs  . Lack of Transportation (Medical): No  . Lack of Transportation (Non-Medical): No  Physical Activity: Inactive  . Days of Exercise per Week: 0 days  . Minutes of Exercise per Session: 0 min  Stress: No Stress Concern Present  . Feeling of Stress : Not at all  Social Connections: Moderately Integrated  . Frequency of Communication with Friends and Family: More than three times a week  . Frequency of Social Gatherings with Friends and Family: More than three times a week  . Attends Religious Services: More than 4 times per year  . Active Member of Clubs or Organizations: Yes  . Attends Archivist Meetings: Never  . Marital Status: Divorced  Human resources officer Violence: Not At Risk  . Fear of Current or Ex-Partner: No  . Emotionally Abused: No  . Physically Abused: No  . Sexually Abused: No     Current Outpatient Medications:  .  acetaminophen (TYLENOL) 500 MG tablet, Take 1 tablet (500 mg total) by mouth 2 (two) times daily. (Patient taking differently: Take 500 mg by mouth every 6 (six) hours as needed for mild pain.), Disp: 180 tablet, Rfl: 0 .  amLODipine (NORVASC) 2.5 MG tablet, Take 1 tablet (2.5 mg total) by mouth daily., Disp: 90 tablet, Rfl: 1 .  aspirin EC 81 MG tablet, Take 81 mg by mouth., Disp: , Rfl:  .  atorvastatin (LIPITOR) 40 MG tablet, TAKE 1 TABLET(40 MG) BY MOUTH DAILY, Disp: 90 tablet, Rfl: 0 .  Calcium-Vitamin D 600-200 MG-UNIT tablet, Take 1 tablet by mouth daily. , Disp: , Rfl:  .  desonide (DESOWEN) 0.05 % cream, Apply topically 2 (two) times daily., Disp: 30 g, Rfl: 0 .  gabapentin (NEURONTIN) 300 MG capsule,  TAKE ONE CAPSULE BY MOUTH TWICE DAILY (Patient taking differently: Take by mouth 2 (two) times daily. Take 300mg  in the morning and 600 mg at bedtime), Disp: 180 capsule, Rfl: 1 .  losartan (COZAAR) 50 MG tablet, Take 1 tablet (50 mg total) by mouth daily., Disp: 90 tablet, Rfl: 1 .  pantoprazole (PROTONIX) 40 MG tablet, Take 1 tablet (40 mg total) by mouth daily., Disp: 90 tablet, Rfl: 1 .  fluticasone (FLONASE) 50 MCG/ACT nasal spray, Place into the nose. (Patient not taking: No sig reported), Disp: , Rfl:  .  loratadine (CLARITIN) 10 MG tablet, Take 1 tablet (10 mg total) by mouth  daily. (Patient not taking: No sig reported), Disp: 30 tablet, Rfl: 0 .  vitamin B-12 (CYANOCOBALAMIN) 1000 MCG tablet, Take 1,000 mcg by mouth daily. (Patient not taking: No sig reported), Disp: , Rfl:   Allergies  Allergen Reactions  . Lovastatin Other (See Comments)    chest  . Pravastatin     Other reaction(s): Muscle Pain chest  . Rosuvastatin     Other reaction(s): Muscle Pain chest Other reaction(s): Muscle Pain chest  . Statins     Other reaction(s): Muscle Pain chest    I personally reviewed active problem list, medication list, allergies, family history, social history, health maintenance with the patient/caregiver today.   ROS  Ten systems reviewed and is negative except as mentioned in HPI  Objective  Virtual encounter, vitals not obtained.  There is no height or weight on file to calculate BMI.  Physical Exam  Awake, alert and oriented   PHQ2/9: Depression screen [facility] 2/9 07/12/2020 04/22/2020 04/05/2020 10/17/2019 10/02/2019  Decreased Interest 0 0 0 0 0  Down, Depressed, Hopeless 0 0 0 0 0  PHQ - 2 Score 0 0 0 0 0  Altered sleeping - - - - 0  Tired, decreased energy - - - - 0  Change in appetite - - - - 0  Feeling bad or failure about yourself  - - - - 0  Trouble concentrating - - - - 0  Moving slowly or fidgety/restless - - - - 0  Suicidal thoughts - - - - 0  PHQ-9 Score - - -  - 0  Difficult doing work/chores - - - - -  Some recent data might be hidden   PHQ-2/9 Result is negative.    Fall Risk: Fall Risk  07/12/2020 04/22/2020 04/05/2020 10/17/2019 10/02/2019  Falls in the past year? 0 1 1 0 0  Number falls in past yr: 0 0 0 0 0  Injury with Fall? 0 0 0 0 0  Risk for fall due to : - - - Impaired vision;Impaired balance/gait -  Risk for fall due to: Comment - - - - -  Follow up - - - Falls prevention discussed -     Assessment & Plan  1. COVID-19  - benzonatate (TESSALON) 100 MG capsule; Take 1 capsule (100 mg total) by mouth 2 (two) times daily as needed for cough.  Dispense: 40 capsule; Refill: 0 - Nirmatrelvir & Ritonavir (PAXLOVID) 20 x 150 MG & 10 x 100MG  TBPK; Take 3 tablets by mouth in the morning and at bedtime.  Dispense: 30 each; Refill: 0   Discussed importance of getting a pulse oximeter and go to EC if pulse ox goes below 90 Staying hydrated Try to recline when sleeping Get medication filled through drive through and self isolation   2. Cough  - benzonatate (TESSALON) 100 MG capsule; Take 1 capsule (100 mg total) by mouth 2 (two) times daily as needed for cough.  Dispense: 40 capsule; Refill: 0 - Nirmatrelvir & Ritonavir (PAXLOVID) 20 x 150 MG & 10 x 100MG  TBPK; Take 3 tablets by mouth in the morning and at bedtime.  Dispense: 30 each; Refill: 0  3. History of CVA (cerebrovascular accident) without residual deficits   I discussed the assessment and treatment plan with the patient. The patient was provided an opportunity to ask questions and all were answered. The patient agreed with the plan and demonstrated an understanding of the instructions.  The patient was advised to call back or seek an  in-person evaluation if the symptoms worsen or if the condition fails to improve as anticipated.  I provided 15 minutes of non-face-to-face time during this encounter.

## 2020-08-05 DIAGNOSIS — I10 Essential (primary) hypertension: Secondary | ICD-10-CM | POA: Diagnosis not present

## 2020-08-05 DIAGNOSIS — Z8673 Personal history of transient ischemic attack (TIA), and cerebral infarction without residual deficits: Secondary | ICD-10-CM | POA: Diagnosis not present

## 2020-08-05 DIAGNOSIS — R42 Dizziness and giddiness: Secondary | ICD-10-CM | POA: Diagnosis not present

## 2020-08-05 DIAGNOSIS — I499 Cardiac arrhythmia, unspecified: Secondary | ICD-10-CM | POA: Diagnosis not present

## 2020-08-05 DIAGNOSIS — R0789 Other chest pain: Secondary | ICD-10-CM | POA: Diagnosis not present

## 2020-09-24 ENCOUNTER — Other Ambulatory Visit: Payer: Self-pay | Admitting: Family Medicine

## 2020-09-24 DIAGNOSIS — E782 Mixed hyperlipidemia: Secondary | ICD-10-CM

## 2020-09-29 ENCOUNTER — Other Ambulatory Visit: Payer: Self-pay | Admitting: Family Medicine

## 2020-09-29 DIAGNOSIS — I10 Essential (primary) hypertension: Secondary | ICD-10-CM

## 2020-09-29 NOTE — Telephone Encounter (Signed)
Requested Prescriptions  Pending Prescriptions Disp Refills  . losartan (COZAAR) 50 MG tablet [Pharmacy Med Name: LOSARTAN '50MG'$  TABLETS] 90 tablet 0    Sig: TAKE 1 TABLET(50 MG) BY MOUTH DAILY     Cardiovascular:  Angiotensin Receptor Blockers Failed - 09/29/2020  7:18 AM      Failed - Last BP in normal range    BP Readings from Last 1 Encounters:  06/20/20 (!) 143/84         Passed - Cr in normal range and within 180 days    Creat  Date Value Ref Range Status  04/05/2020 0.66 0.60 - 0.88 mg/dL Final    Comment:    For patients >75 years of age, the reference limit for Creatinine is approximately 13% higher for people identified as African-American. .          Passed - K in normal range and within 180 days    Potassium  Date Value Ref Range Status  04/05/2020 3.9 3.5 - 5.3 mmol/L Final  01/22/2014 3.8 3.5 - 5.1 mmol/L Final         Passed - Patient is not pregnant      Passed - Valid encounter within last 6 months    Recent Outpatient Visits          2 months ago [facility] Steele Sizer, MD   5 months ago Change in bowel movement   [facility] Steele Sizer, MD   5 months ago Hyperglycemia   [facility] Steele Sizer, MD   12 months ago Calcified cerebral meningioma [facility]   [facility] Steele Sizer, MD   1 year ago Rash in adult   [facility] Steele Sizer, MD      Future Appointments            In 2 weeks [place]

## 2020-10-17 ENCOUNTER — Ambulatory Visit: Payer: Medicare Other

## 2020-10-20 ENCOUNTER — Other Ambulatory Visit: Payer: Self-pay | Admitting: Family Medicine

## 2020-10-20 DIAGNOSIS — K219 Gastro-esophageal reflux disease without esophagitis: Secondary | ICD-10-CM

## 2020-10-20 DIAGNOSIS — M5442 Lumbago with sciatica, left side: Secondary | ICD-10-CM | POA: Diagnosis not present

## 2020-10-20 NOTE — Telephone Encounter (Signed)
Requested Prescriptions  Pending Prescriptions Disp Refills  . pantoprazole (PROTONIX) 40 MG tablet [Pharmacy Med Name: PANTOPRAZOLE '40MG'$  TABLETS] 90 tablet 1    Sig: TAKE 1 TABLET(40 MG) BY MOUTH DAILY     Gastroenterology: Proton Pump Inhibitors Passed - 10/20/2020  4:18 PM      Passed - Valid encounter within last 12 months    Recent Outpatient Visits          3 months ago [facility] Steele Sizer, MD   6 months ago Change in bowel movement   [facility] Steele Sizer, MD   6 months ago Hyperglycemia   [facility] Steele Sizer, MD   1 year ago Calcified cerebral meningioma [facility])   [facility] Steele Sizer, MD   1 year ago Rash in adult   [facility] Steele Sizer, MD      Future Appointments            In 2 weeks Steele Sizer, MD [facility]

## 2020-11-04 ENCOUNTER — Ambulatory Visit (INDEPENDENT_AMBULATORY_CARE_PROVIDER_SITE_OTHER): Payer: Medicare Other | Admitting: Family Medicine

## 2020-11-04 ENCOUNTER — Other Ambulatory Visit: Payer: Self-pay

## 2020-11-04 ENCOUNTER — Encounter: Payer: Self-pay | Admitting: Family Medicine

## 2020-11-04 VITALS — BP 128/74 | HR 89 | Temp 98.2°F | Resp 16 | Ht 63.0 in | Wt 145.0 lb

## 2020-11-04 DIAGNOSIS — M62838 Other muscle spasm: Secondary | ICD-10-CM

## 2020-11-04 DIAGNOSIS — D320 Benign neoplasm of cerebral meninges: Secondary | ICD-10-CM | POA: Diagnosis not present

## 2020-11-04 DIAGNOSIS — I10 Essential (primary) hypertension: Secondary | ICD-10-CM | POA: Diagnosis not present

## 2020-11-04 DIAGNOSIS — K219 Gastro-esophageal reflux disease without esophagitis: Secondary | ICD-10-CM

## 2020-11-04 DIAGNOSIS — E559 Vitamin D deficiency, unspecified: Secondary | ICD-10-CM

## 2020-11-04 DIAGNOSIS — M542 Cervicalgia: Secondary | ICD-10-CM

## 2020-11-04 DIAGNOSIS — R6889 Other general symptoms and signs: Secondary | ICD-10-CM | POA: Diagnosis not present

## 2020-11-04 DIAGNOSIS — M8589 Other specified disorders of bone density and structure, multiple sites: Secondary | ICD-10-CM | POA: Diagnosis not present

## 2020-11-04 DIAGNOSIS — G629 Polyneuropathy, unspecified: Secondary | ICD-10-CM | POA: Diagnosis not present

## 2020-11-04 DIAGNOSIS — R739 Hyperglycemia, unspecified: Secondary | ICD-10-CM | POA: Diagnosis not present

## 2020-11-04 DIAGNOSIS — Z8673 Personal history of transient ischemic attack (TIA), and cerebral infarction without residual deficits: Secondary | ICD-10-CM

## 2020-11-04 DIAGNOSIS — M170 Bilateral primary osteoarthritis of knee: Secondary | ICD-10-CM | POA: Diagnosis not present

## 2020-11-04 DIAGNOSIS — M5090 Cervical disc disorder, unspecified, unspecified cervical region: Secondary | ICD-10-CM

## 2020-11-04 DIAGNOSIS — E538 Deficiency of other specified B group vitamins: Secondary | ICD-10-CM

## 2020-11-04 DIAGNOSIS — E782 Mixed hyperlipidemia: Secondary | ICD-10-CM

## 2020-11-04 DIAGNOSIS — Z23 Encounter for immunization: Secondary | ICD-10-CM

## 2020-11-04 DIAGNOSIS — G5730 Lesion of lateral popliteal nerve, unspecified lower limb: Secondary | ICD-10-CM

## 2020-11-04 MED ORDER — VITAMIN B-12 1000 MCG PO TABS: 1000.0000 ug | ORAL_TABLET | ORAL | 0 refills | Status: DC

## 2020-11-04 MED ORDER — GABAPENTIN 300 MG PO CAPS: 300.0000 mg | ORAL_CAPSULE | Freq: Two times a day (BID) | ORAL | 1 refills | Status: DC

## 2020-11-04 MED ORDER — BACLOFEN 20 MG PO TABS
20.0000 mg | ORAL_TABLET | Freq: Every evening | ORAL | 0 refills | Status: DC | PRN
Start: 2020-11-04 — End: 2021-03-07

## 2020-11-04 MED ORDER — LOSARTAN POTASSIUM 100 MG PO TABS: 100.0000 mg | ORAL_TABLET | Freq: Every day | ORAL | 0 refills | Status: DC

## 2020-11-04 NOTE — Progress Notes (Signed)
cleName: Catherine Burgess   MRN: [id number]    DOB: 19-Jun-1935   Date:11/04/2020       Progress Note  Subjective  Chief Complaint  Follow Up  HPI  Pigmented purpuric dermatosis:  Started end of 2020, under the care of dermatologist    Abnormal MRI brain from 2018 also abnormal CT brain from 06/2017. CT showed calcified meningioma, she denies headaches, she saw Dr. Manuella Ghazi but they just discussed neuropathy and is taking higher dose gabapentin, she was taking B12 but levels were elevated, advised to resume it a few days a week from now on    AR: she states not having any problems at this time, she uses nasal spray prn only . Denies rhinorrhea or nasal congestion at this time   GERD: she is taking Pantoprazole once a day, no heartburn or indigestion now. She was seen by dr. Marius Ditch in May and had repeat colonoscopy due to change in bowel movements ( she also has a history of volvulus ) , colonoscopy showed diverticulosis    OA/s/p left knee replacement: she has intermittent pain, taking Meloxicam prn   Muscle spasms: she was seen at urgent care and was given skelaxin, however very expensive, we will resume Baclofen at 20 mg to take prh qhs    Hyperlipidemia: taking medication, denies myalgia, no chest pain , reviewed labs labs    HTN: bp is at goal today, she is tolerating medication well,  no chest pain, dizziness  or palpitation.   Osteopenia: reviewed last bone density 2020, continue high calcium diet , and take vitamin D 2000 units daily , also needs to continue being physically active. Stable   Hyperglycemia : last A1C was 6.4 %, she denies polyphagia, polydipsia or polyuria, we will recheck levels today    Patient Active Problem List   Diagnosis Date Noted   Altered bowel habits    Pre-diabetes 10/02/2018   Abnormal ankle brachial index (ABI) 03/02/2018   Calcified cerebral meningioma ([place]) 10/04/2017   History of CVA (cerebrovascular accident) without residual deficits  10/04/2017   Subacromial impingement of right shoulder 10/04/2017   Volvulus ([facility])    History of anemia 10/13/2016   Syncope 07/23/2016   Hypokalemia 07/23/2016   Abnormal CT of brain 07/23/2016   Vitamin D deficiency 10/28/2015   Post menopausal syndrome 10/28/2015   Osteopenia after menopause 10/28/2015   Cervical disc disease 10/28/2015   H/O total knee replacement 01/20/2015   Vaginal atrophy 09/26/2014   Osteoarthritis of both knees 07/31/2014   GERD without esophagitis 07/31/2014   HLD (hyperlipidemia) 07/31/2014   Hypertension 07/31/2014    Past Surgical History:  Procedure Laterality Date   CATARACT EXTRACTION, BILATERAL     COLONOSCOPY WITH PROPOFOL N/A 06/20/2020   Procedure: COLONOSCOPY WITH PROPOFOL;  Surgeon: Lin Landsman, MD;  Location: [facility] ENDOSCOPY;  Service: Gastroenterology;  Laterality: N/A;   EYE SURGERY     HIP PINNING Left 1956   JOINT REPLACEMENT     REPLACEMENT TOTAL KNEE Left 11/12/2009   TOTAL KNEE REVISION Left 12/07/2016   Procedure: TOTAL KNEE REVISION;  Surgeon: Dereck Leep, MD;  Location: [facility] ORS;  Service: Orthopedics;  Laterality: Left;    Family History  Problem Relation Age of Onset   Emphysema Father        Smoker   CAD Mother    Stroke Mother    Diabetes Brother    Seizures Brother    Cancer Brother  unknown   Diabetes Brother     Social History   Tobacco Use   Smoking status: Former    Packs/day: 0.25    Years: 1.00    Pack years: 0.25    Types: Cigarettes    Quit date: 1966    Years since quitting: 56.7   Smokeless tobacco: Never   Tobacco comments:    smoking cessation materials not required  Substance Use Topics   Alcohol use: No    Alcohol/week: 0.0 standard drinks     Current Outpatient Medications:    acetaminophen (TYLENOL) 500 MG tablet, Take 1 tablet (500 mg total) by mouth 2 (two) times daily. (Patient taking differently: Take 500 mg by mouth every 6 (six) hours as needed for mild  pain.), Disp: 180 tablet, Rfl: 0   amLODipine (NORVASC) 2.5 MG tablet, Take 1 tablet (2.5 mg total) by mouth daily., Disp: 90 tablet, Rfl: 1   aspirin EC 81 MG tablet, Take 81 mg by mouth., Disp: , Rfl:    atorvastatin (LIPITOR) 40 MG tablet, TAKE 1 TABLET(40 MG) BY MOUTH DAILY, Disp: 90 tablet, Rfl: 0   Calcium-Vitamin D 600-200 MG-UNIT tablet, Take 1 tablet by mouth daily. , Disp: , Rfl:    desonide (DESOWEN) 0.05 % cream, Apply topically 2 (two) times daily., Disp: 30 g, Rfl: 0   gabapentin (NEURONTIN) 300 MG capsule, TAKE ONE CAPSULE BY MOUTH TWICE DAILY (Patient taking differently: Take by mouth 2 (two) times daily. Take 300mg  in the morning and 600 mg at bedtime), Disp: 180 capsule, Rfl: 1   losartan (COZAAR) 50 MG tablet, TAKE 1 TABLET(50 MG) BY MOUTH DAILY, Disp: 90 tablet, Rfl: 0   pantoprazole (PROTONIX) 40 MG tablet, TAKE 1 TABLET(40 MG) BY MOUTH DAILY, Disp: 90 tablet, Rfl: 1   benzonatate (TESSALON) 100 MG capsule, Take 1 capsule (100 mg total) by mouth 2 (two) times daily as needed for cough. (Patient not taking: Reported on 11/04/2020), Disp: 40 capsule, Rfl: 0   fluticasone (FLONASE) 50 MCG/ACT nasal spray, Place into the nose. (Patient not taking: Reported on 11/04/2020), Disp: , Rfl:    loratadine (CLARITIN) 10 MG tablet, Take 1 tablet (10 mg total) by mouth daily. (Patient not taking: Reported on 11/04/2020), Disp: 30 tablet, Rfl: 0   Nirmatrelvir & Ritonavir (PAXLOVID) 20 x 150 MG & 10 x 100MG  TBPK, Take 3 tablets by mouth in the morning and at bedtime. (Patient not taking: Reported on 11/04/2020), Disp: 30 each, Rfl: 0   vitamin B-12 (CYANOCOBALAMIN) 1000 MCG tablet, Take 1,000 mcg by mouth daily. (Patient not taking: Reported on 11/04/2020), Disp: , Rfl:   Allergies  Allergen Reactions   Lovastatin Other (See Comments)    chest   Pravastatin     Other reaction(s): Muscle Pain chest   Rosuvastatin     Other reaction(s): Muscle Pain chest Other reaction(s): Muscle Pain chest    Statins     Other reaction(s): Muscle Pain chest    I personally reviewed active problem list, medication list, allergies, family history, social history, health maintenance with the patient/caregiver today.   ROS  Constitutional: Negative for fever or weight change.  Respiratory: Negative for cough and shortness of breath.   Cardiovascular: Negative for chest pain or palpitations.  Gastrointestinal: Negative for abdominal pain, no bowel changes.  Musculoskeletal: Negative for gait problem or joint swelling.  Skin: Negative for rash.  Neurological: Negative for dizziness or headache.  No other specific complaints in a complete review of systems (except  as listed in HPI above).   Objective  Vitals:   11/04/20 1419  BP: 128/74  Pulse: 89  Resp: 16  Temp: 98.2 F (36.8 C)  SpO2: 97%  Weight: 145 lb (65.8 kg)  Height: 5\' 3"  (1.6 m)    Body mass index is 25.69 kg/m.  Physical Exam  Constitutional: Patient appears well-developed and well-nourished.  No distress.  HEENT: head atraumatic, normocephalic, pupils equal and reactive to light, neck supple Cardiovascular: Normal rate, regular rhythm and normal heart sounds.  No murmur heard. No BLE edema. Pulmonary/Chest: Effort normal and breath sounds normal. No respiratory distress. Abdominal: Soft.  There is no tenderness. Psychiatric: Patient has a normal mood and affect. behavior is normal. Judgment and thought content normal.   PHQ2/9: Depression screen [facility] 2/9 11/04/2020 07/12/2020 04/22/2020 04/05/2020 10/17/2019  Decreased Interest 0 0 0 0 0  Down, Depressed, Hopeless 0 0 0 0 0  PHQ - 2 Score 0 0 0 0 0  Altered sleeping - - - - -  Tired, decreased energy - - - - -  Change in appetite - - - - -  Feeling bad or failure about yourself  - - - - -  Trouble concentrating - - - - -  Moving slowly or fidgety/restless - - - - -  Suicidal thoughts - - - - -  PHQ-9 Score - - - - -  Difficult doing work/chores - - - - -  Some  recent data might be hidden    phq 9 is negative   Fall Risk: Fall Risk  11/04/2020 07/12/2020 04/22/2020 04/05/2020 10/17/2019  Falls in the past year? 0 0 1 1 0  Number falls in past yr: 0 0 0 0 0  Injury with Fall? 0 0 0 0 0  Risk for fall due to : No Fall Risks - - - Impaired vision;Impaired balance/gait  Risk for fall due to: Comment - - - - -  Follow up Falls prevention discussed - - - Falls prevention discussed      Functional Status Survey: Is the patient deaf or have difficulty hearing?: Yes Does the patient have difficulty seeing, even when wearing glasses/contacts?: No Does the patient have difficulty concentrating, remembering, or making decisions?: No Does the patient have difficulty walking or climbing stairs?: Yes Does the patient have difficulty dressing or bathing?: No Does the patient have difficulty doing errands alone such as visiting a doctor's office or shopping?: No    Assessment & Plan  1. Abnormal ankle brachial index (ABI)  - Ambulatory referral to Vascular Surgery  2. Need for immunization against influenza  - Flu Vaccine QUAD High Dose(Fluad)  3. Hyperglycemia   4. Vitamin D deficiency   5. History of CVA (cerebrovascular accident) without residual deficits   6. Essential hypertension  - losartan (COZAAR) 100 MG tablet; Take 1 tablet (100 mg total) by mouth daily.  Dispense: 90 tablet; Refill: 0  7. GERD without esophagitis   8. Osteopenia of multiple sites   9. Peripheral polyneuropathy  - gabapentin (NEURONTIN) 300 MG capsule; Take 1-2 capsules (300-600 mg total) by mouth 2 (two) times daily. Take 300mg  in the morning and 600 mg at bedtime  Dispense: 270 capsule; Refill: 1 - vitamin B-12 (CYANOCOBALAMIN) 1000 MCG tablet; Take 1 tablet (1,000 mcg total) by mouth 3 (three) times a week.  Dispense: 12 tablet; Refill: 0  10. Calcified cerebral meningioma ([place])   11. Cervical disc disease  - gabapentin (NEURONTIN) 300 MG capsule;  Take 1-2 capsules (300-600 mg total) by mouth 2 (two) times daily. Take 300mg  in the morning and 600 mg at bedtime  Dispense: 270 capsule; Refill: 1  12. Primary osteoarthritis of both knees   13. Mixed hyperlipidemia   14. Osteoarthritis of both knees, unspecified osteoarthritis type  - gabapentin (NEURONTIN) 300 MG capsule; Take 1-2 capsules (300-600 mg total) by mouth 2 (two) times daily. Take 300mg  in the morning and 600 mg at bedtime  Dispense: 270 capsule; Refill: 1  15. Neck pain  - gabapentin (NEURONTIN) 300 MG capsule; Take 1-2 capsules (300-600 mg total) by mouth 2 (two) times daily. Take 300mg  in the morning and 600 mg at bedtime  Dispense: 270 capsule; Refill: 1  16. Disorder of common peroneal nerve  - gabapentin (NEURONTIN) 300 MG capsule; Take 1-2 capsules (300-600 mg total) by mouth 2 (two) times daily. Take 300mg  in the morning and 600 mg at bedtime  Dispense: 270 capsule; Refill: 1  17. B12 deficiency  - vitamin B-12 (CYANOCOBALAMIN) 1000 MCG tablet; Take 1 tablet (1,000 mcg total) by mouth 3 (three) times a week.  Dispense: 12 tablet; Refill: 0

## 2020-11-11 DIAGNOSIS — G4733 Obstructive sleep apnea (adult) (pediatric): Secondary | ICD-10-CM | POA: Diagnosis not present

## 2020-11-11 DIAGNOSIS — K219 Gastro-esophageal reflux disease without esophagitis: Secondary | ICD-10-CM | POA: Diagnosis not present

## 2020-11-11 DIAGNOSIS — Z9989 Dependence on other enabling machines and devices: Secondary | ICD-10-CM | POA: Diagnosis not present

## 2020-11-11 DIAGNOSIS — R600 Localized edema: Secondary | ICD-10-CM | POA: Diagnosis not present

## 2020-11-11 DIAGNOSIS — R0789 Other chest pain: Secondary | ICD-10-CM | POA: Diagnosis not present

## 2020-11-11 DIAGNOSIS — E119 Type 2 diabetes mellitus without complications: Secondary | ICD-10-CM | POA: Diagnosis not present

## 2020-11-11 DIAGNOSIS — E782 Mixed hyperlipidemia: Secondary | ICD-10-CM | POA: Diagnosis not present

## 2020-11-11 DIAGNOSIS — I498 Other specified cardiac arrhythmias: Secondary | ICD-10-CM | POA: Diagnosis not present

## 2020-11-11 DIAGNOSIS — Z8673 Personal history of transient ischemic attack (TIA), and cerebral infarction without residual deficits: Secondary | ICD-10-CM | POA: Diagnosis not present

## 2020-11-11 DIAGNOSIS — I10 Essential (primary) hypertension: Secondary | ICD-10-CM | POA: Diagnosis not present

## 2020-11-11 DIAGNOSIS — I159 Secondary hypertension, unspecified: Secondary | ICD-10-CM | POA: Diagnosis not present

## 2020-11-21 ENCOUNTER — Other Ambulatory Visit (INDEPENDENT_AMBULATORY_CARE_PROVIDER_SITE_OTHER): Payer: Self-pay | Admitting: Vascular Surgery

## 2020-11-21 ENCOUNTER — Other Ambulatory Visit (INDEPENDENT_AMBULATORY_CARE_PROVIDER_SITE_OTHER): Payer: Self-pay | Admitting: Family Medicine

## 2020-11-21 DIAGNOSIS — R6889 Other general symptoms and signs: Secondary | ICD-10-CM

## 2020-11-21 DIAGNOSIS — R0989 Other specified symptoms and signs involving the circulatory and respiratory systems: Secondary | ICD-10-CM

## 2020-11-25 ENCOUNTER — Ambulatory Visit (INDEPENDENT_AMBULATORY_CARE_PROVIDER_SITE_OTHER): Payer: Medicare Other

## 2020-11-25 ENCOUNTER — Ambulatory Visit (INDEPENDENT_AMBULATORY_CARE_PROVIDER_SITE_OTHER): Payer: Medicare Other | Admitting: Nurse Practitioner

## 2020-11-25 ENCOUNTER — Other Ambulatory Visit: Payer: Self-pay

## 2020-11-25 ENCOUNTER — Encounter (INDEPENDENT_AMBULATORY_CARE_PROVIDER_SITE_OTHER): Payer: Self-pay | Admitting: Nurse Practitioner

## 2020-11-25 VITALS — BP 167/82 | HR 82 | Resp 16 | Ht 63.0 in | Wt 146.0 lb

## 2020-11-25 DIAGNOSIS — I10 Essential (primary) hypertension: Secondary | ICD-10-CM

## 2020-11-25 DIAGNOSIS — R0989 Other specified symptoms and signs involving the circulatory and respiratory systems: Secondary | ICD-10-CM | POA: Diagnosis not present

## 2020-11-25 DIAGNOSIS — E782 Mixed hyperlipidemia: Secondary | ICD-10-CM | POA: Diagnosis not present

## 2020-11-25 DIAGNOSIS — R6889 Other general symptoms and signs: Secondary | ICD-10-CM | POA: Diagnosis not present

## 2020-11-25 DIAGNOSIS — I739 Peripheral vascular disease, unspecified: Secondary | ICD-10-CM | POA: Diagnosis not present

## 2020-11-28 DIAGNOSIS — H04123 Dry eye syndrome of bilateral lacrimal glands: Secondary | ICD-10-CM | POA: Diagnosis not present

## 2020-12-01 ENCOUNTER — Encounter (INDEPENDENT_AMBULATORY_CARE_PROVIDER_SITE_OTHER): Payer: Self-pay | Admitting: Nurse Practitioner

## 2020-12-01 NOTE — Progress Notes (Signed)
Subjective:    Patient ID: Catherine Burgess, female    DOB: 1936/02/08, 85 y.o.   MRN: [id number] No chief complaint on file.   Beverlee Wilmarth is an 85 year old female that presents today as referral from her primary care provider due to diminished pulses.  The patient denies any claudication-like symptoms.  She denies any rest pain or ulcerations.  She does experience some edema in her lower extremities at times.  However this is fairly well controlled.  Today noninvasive studies show an ABI of 1.13 on the right and 1.09 on the left.  The patient has biphasic waveforms from her distal common femoral artery down to the distal tibials bilaterally.  There is no evidence of significant obstruction noted.  The toe waveforms are dampened bilaterally.   Review of Systems  Cardiovascular:  Positive for leg swelling.  All other systems reviewed and are negative.     Objective:   Physical Exam Vitals reviewed.  HENT:     Head: Normocephalic.  Cardiovascular:     Rate and Rhythm: Normal rate.     Pulses: Decreased pulses.  Pulmonary:     Effort: Pulmonary effort is normal.  Skin:    General: Skin is warm and dry.  Neurological:     Mental Status: She is alert and oriented to person, place, and time.  Psychiatric:        Mood and Affect: Mood normal.        Behavior: Behavior normal.        Thought Content: Thought content normal.        Judgment: Judgment normal.    BP (!) 167/82 (BP Location: Left Arm)   Pulse 82   Resp 16   Ht 5\' 3"  (1.6 m)   Wt 146 lb (66.2 kg)   BMI 25.86 kg/m   Past Medical History:  Diagnosis Date   Degenerative joint disease    GERD (gastroesophageal reflux disease)    Hyperlipidemia    Hypertension    Malignant essential hypertension 07/23/2016   Osteoarthrosis    Reflux esophagitis     Social History   Socioeconomic History   Marital status: Divorced    Spouse name: Not on file   Number of children: 1   Years of education: Not on file    Highest education level: 12th grade  Occupational History   Occupation: Retired  Tobacco Use   Smoking status: Former    Packs/day: 0.25    Years: 1.00    Pack years: 0.25    Types: Cigarettes    Quit date: 1966    Years since quitting: 56.8   Smokeless tobacco: Never   Tobacco comments:    smoking cessation materials not required  Vaping Use   Vaping Use: Never used  Substance and Sexual Activity   Alcohol use: No    Alcohol/week: 0.0 standard drinks   Drug use: No   Sexual activity: Not Currently  Other Topics Concern   Not on file  Social History Narrative   Pt lives alone.    Social Determinants of Health   Financial Resource Strain: Not on file  Food Insecurity: Not on file  Transportation Needs: Not on file  Physical Activity: Not on file  Stress: Not on file  Social Connections: Not on file  Intimate Partner Violence: Not on file    Past Surgical History:  Procedure Laterality Date   CATARACT EXTRACTION, BILATERAL     COLONOSCOPY WITH PROPOFOL N/A 06/20/2020  Procedure: COLONOSCOPY WITH PROPOFOL;  Surgeon: Lin Landsman, MD;  Location: [facility] ENDOSCOPY;  Service: Gastroenterology;  Laterality: N/A;   EYE SURGERY     HIP PINNING Left 1956   JOINT REPLACEMENT     REPLACEMENT TOTAL KNEE Left 11/12/2009   TOTAL KNEE REVISION Left 12/07/2016   Procedure: TOTAL KNEE REVISION;  Surgeon: Dereck Leep, MD;  Location: [facility] ORS;  Service: Orthopedics;  Laterality: Left;    Family History  Problem Relation Age of Onset   Emphysema Father        Smoker   CAD Mother    Stroke Mother    Diabetes Brother    Seizures Brother    Cancer Brother        unknown   Diabetes Brother     Allergies  Allergen Reactions   Lovastatin Other (See Comments)    chest   Pravastatin     Other reaction(s): Muscle Pain chest   Rosuvastatin     Other reaction(s): Muscle Pain chest Other reaction(s): Muscle Pain chest   Statins     Other reaction(s): Muscle  Pain chest    CBC Latest Ref Rng & Units 04/05/2020 10/02/2019 09/30/2018  WBC 3.8 - 10.8 Thousand/uL 7.8 6.8 7.5  Hemoglobin 11.7 - 15.5 g/dL 14.6 14.5 14.3  Hematocrit 35.0 - 45.0 % 44.7 44.5 44.9  Platelets 140 - 400 Thousand/uL 293 306 357      CMP     Component Value Date/Time   NA 144 04/05/2020 1118   NA 144 05/28/2015 1553   NA 140 01/22/2014 1018   K 3.9 04/05/2020 1118   K 3.8 01/22/2014 1018   CL 108 04/05/2020 1118   CL 100 01/22/2014 1018   CO2 28 04/05/2020 1118   CO2 33 (H) 01/22/2014 1018   GLUCOSE 85 04/05/2020 1118   GLUCOSE 106 (H) 01/22/2014 1018   BUN 14 04/05/2020 1118   BUN 21 05/28/2015 1553   BUN 22 (H) 01/22/2014 1018   CREATININE 0.66 04/05/2020 1118   CALCIUM 9.3 04/05/2020 1118   CALCIUM 8.9 01/22/2014 1018   PROT 6.1 04/05/2020 1118   PROT 7.2 05/28/2015 1553   PROT 7.3 01/22/2014 1018   ALBUMIN 3.7 01/24/2017 0338   ALBUMIN 4.5 05/28/2015 1553   ALBUMIN 3.5 01/22/2014 1018   AST 16 04/05/2020 1118   AST 17 01/22/2014 1018   ALT 15 04/05/2020 1118   ALT 24 01/22/2014 1018   ALKPHOS 133 (H) 01/24/2017 0338   ALKPHOS 157 (H) 01/22/2014 1018   BILITOT 0.9 04/05/2020 1118   BILITOT 0.6 05/28/2015 1553   BILITOT 1.0 01/22/2014 1018   GFRNONAA 82 04/05/2020 1118   GFRAA 95 04/05/2020 1118     No results found.     Assessment & Plan:   1. Abnormal ankle brachial index (ABI) ABIs are within normal limits today but there is evidence of PAD as noted below. - VAS [place] LOWER EXTREMITY ARTERIAL DUPLEX  2. PAD (peripheral artery disease) ([place]) While the patient has ABIs that were within normal limits, she has biphasic waveforms with dampened toe waveforms indicating some level of PAD.  While patient return in 6 months to reevaluate her noninvasive studies..  Patient is advised to follow-up sooner if she begins to develop worsening claudication, rest pain or wounds or ulcerations. - VAS [place] ABI WITH/WO TBI; Future - VAS [place] LOWER EXTREMITY  ARTERIAL DUPLEX; Future  3. Mixed hyperlipidemia Continue statin as ordered and reviewed, no changes at this time  4. Primary hypertension Continue antihypertensive medications as already ordered, these medications have been reviewed and there are no changes at this time.    Current Outpatient Medications on File Prior to Visit  Medication Sig Dispense Refill   acetaminophen (TYLENOL) 500 MG tablet Take 1 tablet (500 mg total) by mouth 2 (two) times daily. (Patient taking differently: Take 500 mg by mouth every 6 (six) hours as needed for mild pain.) 180 tablet 0   aspirin EC 81 MG tablet Take 81 mg by mouth.     atorvastatin (LIPITOR) 40 MG tablet TAKE 1 TABLET(40 MG) BY MOUTH DAILY 90 tablet 0   baclofen (LIORESAL) 20 MG tablet Take 1 tablet (20 mg total) by mouth at bedtime as needed for muscle spasms. 90 each 0   Calcium-Vitamin D 600-200 MG-UNIT tablet Take 1 tablet by mouth daily.      gabapentin (NEURONTIN) 300 MG capsule Take 1-2 capsules (300-600 mg total) by mouth 2 (two) times daily. Take 300mg  in the morning and 600 mg at bedtime 270 capsule 1   losartan (COZAAR) 100 MG tablet Take 1 tablet (100 mg total) by mouth daily. 90 tablet 0   pantoprazole (PROTONIX) 40 MG tablet TAKE 1 TABLET(40 MG) BY MOUTH DAILY 90 tablet 1   vitamin B-12 (CYANOCOBALAMIN) 1000 MCG tablet Take 1 tablet (1,000 mcg total) by mouth 3 (three) times a week. 12 tablet 0   desonide (DESOWEN) 0.05 % cream Apply topically 2 (two) times daily. (Patient not taking: Reported on 11/25/2020) 30 g 0   loratadine (CLARITIN) 10 MG tablet Take 1 tablet (10 mg total) by mouth daily. (Patient not taking: No sig reported) 30 tablet 0   No current facility-administered medications on file prior to visit.    There are no Patient Instructions on file for this visit. Return in about 6 months (around 05/26/2021) for PAD.   Kris Hartmann, NP

## 2020-12-28 ENCOUNTER — Other Ambulatory Visit: Payer: Self-pay | Admitting: Family Medicine

## 2020-12-28 DIAGNOSIS — E782 Mixed hyperlipidemia: Secondary | ICD-10-CM

## 2020-12-28 NOTE — Telephone Encounter (Signed)
Requested Prescriptions  Pending Prescriptions Disp Refills  . atorvastatin (LIPITOR) 40 MG tablet [Pharmacy Med Name: ATORVASTATIN 40MG  TABLETS] 90 tablet 0    Sig: TAKE 1 TABLET(40 MG) BY MOUTH DAILY     Cardiovascular:  Antilipid - Statins Failed - 12/28/2020  7:10 AM      Failed - HDL in normal range and within 360 days    HDL  Date Value Ref Range Status  04/05/2020 44 (L) > OR = 50 mg/dL Final  01/15/2015 41 >39 mg/dL Final         Passed - Total Cholesterol in normal range and within 360 days    Cholesterol, Total  Date Value Ref Range Status  01/15/2015 112 100 - 199 mg/dL Final   Cholesterol  Date Value Ref Range Status  04/05/2020 113 <200 mg/dL Final         Passed - LDL in normal range and within 360 days    LDL Cholesterol (Calc)  Date Value Ref Range Status  04/05/2020 54 mg/dL (calc) Final    Comment:    Reference range: <100 . Desirable range <100 mg/dL for primary prevention;   <70 mg/dL for patients with CHD or diabetic patients  with > or = 2 CHD risk factors. [name] LDL-C is now calculated using the Martin-Hopkins  calculation, which is a validated novel method providing  better accuracy than the Friedewald equation in the  estimation of LDL-C.  Cresenciano Genre et al. Annamaria Helling. 2585;277(82): 2061-2068  (http://education.[employer].com/faq/FAQ164)          Passed - Triglycerides in normal range and within 360 days    Triglycerides  Date Value Ref Range Status  04/05/2020 72 <150 mg/dL Final         Passed - Patient is not pregnant      Passed - Valid encounter within last 12 months    Recent Outpatient Visits          1 month ago Calcified cerebral meningioma [facility])   [facility] Steele Sizer, MD   5 months ago COVID-19   [facility] Steele Sizer, MD   8 months ago Change in bowel movement   [facility] Steele Sizer, MD   8 months ago Hyperglycemia   [facility] Steele Sizer, MD   1 year ago Calcified cerebral meningioma [facility])   [facility] Steele Sizer, MD      Future Appointments            In 2 months [name], [name], MD [facility]

## 2021-03-06 NOTE — Progress Notes (Signed)
Name: Catherine Burgess   MRN: [id number]    DOB: 06-04-1935   Date:03/07/2021       Progress Note  Subjective  Chief Complaint  Follow Up  HPI  Pigmented purpuric dermatosis:  Started end of 2020, under the care of dermatologist Doing well, only a small patch on left chin area, she has not been back for follow up   Abnormal MRI brain from 2018 also abnormal CT brain from 06/2017. CT showed calcified meningioma, she denies headaches, dizziness or neuro deficitis, she has a follow up with Dr. Manuella Ghazi coming up  Neuropathy: she takes gabapentin and tingling and numbness has resolved. Taking B12 3 times a week,  and gabapentin as advised.    TK:ZSWFU well at this time, no symptoms   GERD: she is taking Pantoprazole once a day, no heartburn or indigestion now. She was seen by dr. Marius Ditch in May and had repeat colonoscopy due to change in bowel movements ( she also has a history of volvulus ) , colonoscopy showed diverticulosis but no other problems    OA/s/p left knee replacement: she has intermittent pain, taking Tylenol prn now, very seldom she takes meloxicam 7.5 mg Sometimes she has back pain and spasms, takes tyelnol and baclofen prn also for that    Hyperlipidemia: taking medication, denies myalgia, no chest pain , we will recheck labs    HTN: bp used to be towards low end of normal and we stopped norvasc 2.5 mg and gave her higher dose of losartan from 50 to 100 mg, however bp has been elevated now, we will resume norvasc 2.5 and decrease losartan to 25 mg and recheck in 3 months, advised her to come back sooner for CMA visit only to make sure bp is controlled    Osteopenia: reviewed last bone density 2020, continue high calcium diet , and take vitamin D 2000 units daily , also needs to continue being physically active. Discussed repeating bone density but she would like to hold off for now   Hyperglycemia : last A1C was 6.4 %, she denies polyphagia, polydipsia or polyuria. We will recheck  labs today   Patient Active Problem List   Diagnosis Date Noted   Altered bowel habits    Pre-diabetes 10/02/2018   Abnormal ankle brachial index (ABI) 03/02/2018   Calcified cerebral meningioma ([place]) 10/04/2017   History of CVA (cerebrovascular accident) without residual deficits 10/04/2017   Subacromial impingement of right shoulder 10/04/2017   Volvulus (HCC)    History of anemia 10/13/2016   Syncope 07/23/2016   Hypokalemia 07/23/2016   Abnormal CT of brain 07/23/2016   Vitamin D deficiency 10/28/2015   Post menopausal syndrome 10/28/2015   Osteopenia after menopause 10/28/2015   Cervical disc disease 10/28/2015   H/O total knee replacement 01/20/2015   Vaginal atrophy 09/26/2014   Osteoarthritis of both knees 07/31/2014   GERD without esophagitis 07/31/2014   HLD (hyperlipidemia) 07/31/2014   Hypertension 07/31/2014    Past Surgical History:  Procedure Laterality Date   CATARACT EXTRACTION, BILATERAL     COLONOSCOPY WITH PROPOFOL N/A 06/20/2020   Procedure: COLONOSCOPY WITH PROPOFOL;  Surgeon: Lin Landsman, MD;  Location: [facility] ENDOSCOPY;  Service: Gastroenterology;  Laterality: N/A;   EYE SURGERY     HIP PINNING Left 1956   JOINT REPLACEMENT     REPLACEMENT TOTAL KNEE Left 11/12/2009   TOTAL KNEE REVISION Left 12/07/2016   Procedure: TOTAL KNEE REVISION;  Surgeon: Dereck Leep, MD;  Location: [facility] ORS;  Service: Orthopedics;  Laterality: Left;    Family History  Problem Relation Age of Onset   Emphysema Father        Smoker   CAD Mother    Stroke Mother    Diabetes Brother    Seizures Brother    Cancer Brother        unknown   Diabetes Brother     Social History   Tobacco Use   Smoking status: Former    Packs/day: 0.25    Years: 1.00    Pack years: 0.25    Types: Cigarettes    Quit date: 1966    Years since quitting: 57.1   Smokeless tobacco: Never   Tobacco comments:    smoking cessation materials not required  Substance Use Topics    Alcohol use: No    Alcohol/week: 0.0 standard drinks     Current Outpatient Medications:    acetaminophen (TYLENOL) 500 MG tablet, Take 1 tablet (500 mg total) by mouth 2 (two) times daily. (Patient taking differently: Take 500 mg by mouth every 6 (six) hours as needed for mild pain.), Disp: 180 tablet, Rfl: 0   aspirin EC 81 MG tablet, Take 81 mg by mouth., Disp: , Rfl:    atorvastatin (LIPITOR) 40 MG tablet, TAKE 1 TABLET(40 MG) BY MOUTH DAILY, Disp: 90 tablet, Rfl: 0   baclofen (LIORESAL) 20 MG tablet, Take 1 tablet (20 mg total) by mouth at bedtime as needed for muscle spasms., Disp: 90 each, Rfl: 0   Calcium-Vitamin D 600-200 MG-UNIT tablet, Take 1 tablet by mouth daily. , Disp: , Rfl:    desonide (DESOWEN) 0.05 % cream, Apply topically 2 (two) times daily., Disp: 30 g, Rfl: 0   gabapentin (NEURONTIN) 300 MG capsule, Take 1-2 capsules (300-600 mg total) by mouth 2 (two) times daily. Take 300mg  in the morning and 600 mg at bedtime, Disp: 270 capsule, Rfl: 1   loratadine (CLARITIN) 10 MG tablet, Take 1 tablet (10 mg total) by mouth daily., Disp: 30 tablet, Rfl: 0   losartan (COZAAR) 100 MG tablet, Take 1 tablet (100 mg total) by mouth daily., Disp: 90 tablet, Rfl: 0   pantoprazole (PROTONIX) 40 MG tablet, TAKE 1 TABLET(40 MG) BY MOUTH DAILY, Disp: 90 tablet, Rfl: 1   vitamin B-12 (CYANOCOBALAMIN) 1000 MCG tablet, Take 1 tablet (1,000 mcg total) by mouth 3 (three) times a week., Disp: 12 tablet, Rfl: 0  Allergies  Allergen Reactions   Lovastatin Other (See Comments)    chest   Pravastatin     Other reaction(s): Muscle Pain chest   Rosuvastatin     Other reaction(s): Muscle Pain chest Other reaction(s): Muscle Pain chest   Statins     Other reaction(s): Muscle Pain chest    I personally reviewed active problem list, medication list, allergies, family history, social history, health maintenance with the patient/caregiver today.   ROS  Constitutional: Negative for fever or  weight change.  Respiratory: Negative for cough and shortness of breath.   Cardiovascular: Negative for chest pain or palpitations.  Gastrointestinal: Negative for abdominal pain, no bowel changes.  Musculoskeletal: Negative for gait problem or joint swelling.  Skin: Negative for rash.  Neurological: Negative for dizziness or headache.  No other specific complaints in a complete review of systems (except as listed in HPI above).   Objective  Vitals:   03/07/21 1022 03/07/21 1031  BP: (!) 160/78 (!) 154/74  Pulse: 83   Resp: 16   Temp: 98.1 F (36.7  C)   TempSrc: Oral   SpO2: 98%   Weight: 142 lb 6.4 oz (64.6 kg)   Height: 5\' 3"  (1.6 m)     Body mass index is 25.23 kg/m.  Physical Exam  Constitutional: Patient appears well-developed and well-nourished.  No distress.  HEENT: head atraumatic, normocephalic, pupils equal and reactive to light,  neck supple Cardiovascular: Normal rate, regular rhythm and normal heart sounds.  No murmur heard. No BLE edema. Pulmonary/Chest: Effort normal and breath sounds normal. No respiratory distress. Abdominal: Soft.  There is no tenderness. Psychiatric: Patient has a normal mood and affect. behavior is normal. Judgment and thought content normal.    PHQ2/9: Depression screen [facility] 2/9 03/07/2021 11/04/2020 07/12/2020 04/22/2020 04/05/2020  Decreased Interest 0 0 0 0 0  Down, Depressed, Hopeless 0 0 0 0 0  PHQ - 2 Score 0 0 0 0 0  Altered sleeping 0 - - - -  Tired, decreased energy 0 - - - -  Change in appetite 0 - - - -  Feeling bad or failure about yourself  0 - - - -  Trouble concentrating 0 - - - -  Moving slowly or fidgety/restless 0 - - - -  Suicidal thoughts 0 - - - -  PHQ-9 Score 0 - - - -  Difficult doing work/chores Not difficult at all - - - -  Some recent data might be hidden    phq 9 is negative   Fall Risk: Fall Risk  03/07/2021 11/04/2020 07/12/2020 04/22/2020 04/05/2020  Falls in the past year? 0 0 0 1 1  Number falls in  past yr: 0 0 0 0 0  Injury with Fall? 0 0 0 0 0  Risk for fall due to : No Fall Risks No Fall Risks - - -  Risk for fall due to: Comment - - - - -  Follow up Falls prevention discussed Falls prevention discussed - - -      Functional Status Survey: Is the patient deaf or have difficulty hearing?: Yes Does the patient have difficulty seeing, even when wearing glasses/contacts?: Yes Does the patient have difficulty concentrating, remembering, or making decisions?: Yes Does the patient have difficulty walking or climbing stairs?: Yes Does the patient have difficulty dressing or bathing?: No Does the patient have difficulty doing errands alone such as visiting a doctor's office or shopping?: No    Assessment & Plan  1. Essential hypertension  - losartan (COZAAR) 25 MG tablet; Take 1 tablet (25 mg total) by mouth daily.  Dispense: 90 tablet; Refill: 0 - amLODipine (NORVASC) 2.5 MG tablet; Take 1 tablet (2.5 mg total) by mouth daily.  Dispense: 90 tablet; Refill: 0 - CBC with Differential/Platelet - COMPLETE METABOLIC PANEL WITH GFR  2. Peripheral polyneuropathy  Continue gabapentin   3. Primary osteoarthritis of both knees   4. Calcified cerebral meningioma ([place])   5. GERD without esophagitis   6. Vitamin D deficiency  - Vitamin D (25 hydroxy)  7. History of CVA (cerebrovascular accident) without residual deficits   8. Mixed hyperlipidemia  - Lipid panel  9. Muscle spasm  - baclofen (LIORESAL) 20 MG tablet; Take 1 tablet (20 mg total) by mouth at bedtime as needed for muscle spasms.  Dispense: 90 each; Refill: 0  10. Hyperglycemia  - Hemoglobin A1c  11. B12 deficiency  - Vitamin B12

## 2021-03-07 ENCOUNTER — Ambulatory Visit (INDEPENDENT_AMBULATORY_CARE_PROVIDER_SITE_OTHER): Payer: Medicare Other | Admitting: Family Medicine

## 2021-03-07 ENCOUNTER — Other Ambulatory Visit: Payer: Self-pay

## 2021-03-07 ENCOUNTER — Encounter: Payer: Self-pay | Admitting: Family Medicine

## 2021-03-07 VITALS — BP 154/74 | HR 83 | Temp 98.1°F | Resp 16 | Ht 63.0 in | Wt 142.4 lb

## 2021-03-07 DIAGNOSIS — G629 Polyneuropathy, unspecified: Secondary | ICD-10-CM

## 2021-03-07 DIAGNOSIS — M62838 Other muscle spasm: Secondary | ICD-10-CM

## 2021-03-07 DIAGNOSIS — I10 Essential (primary) hypertension: Secondary | ICD-10-CM | POA: Diagnosis not present

## 2021-03-07 DIAGNOSIS — M170 Bilateral primary osteoarthritis of knee: Secondary | ICD-10-CM | POA: Diagnosis not present

## 2021-03-07 DIAGNOSIS — R739 Hyperglycemia, unspecified: Secondary | ICD-10-CM

## 2021-03-07 DIAGNOSIS — Z8673 Personal history of transient ischemic attack (TIA), and cerebral infarction without residual deficits: Secondary | ICD-10-CM

## 2021-03-07 DIAGNOSIS — K219 Gastro-esophageal reflux disease without esophagitis: Secondary | ICD-10-CM

## 2021-03-07 DIAGNOSIS — D320 Benign neoplasm of cerebral meninges: Secondary | ICD-10-CM | POA: Diagnosis not present

## 2021-03-07 DIAGNOSIS — E782 Mixed hyperlipidemia: Secondary | ICD-10-CM

## 2021-03-07 DIAGNOSIS — E559 Vitamin D deficiency, unspecified: Secondary | ICD-10-CM

## 2021-03-07 DIAGNOSIS — E538 Deficiency of other specified B group vitamins: Secondary | ICD-10-CM

## 2021-03-07 MED ORDER — LOSARTAN POTASSIUM 25 MG PO TABS: 25.0000 mg | ORAL_TABLET | Freq: Every day | ORAL | 0 refills | Status: DC

## 2021-03-07 MED ORDER — BACLOFEN 20 MG PO TABS: 20.0000 mg | ORAL_TABLET | Freq: Every evening | ORAL | 0 refills | Status: DC | PRN

## 2021-03-07 MED ORDER — AMLODIPINE BESYLATE 2.5 MG PO TABS: 2.5000 mg | ORAL_TABLET | Freq: Every day | ORAL | 0 refills | Status: DC

## 2021-03-08 LAB — HEMOGLOBIN A1C
Hgb A1c MFr Bld: 6.3 % of total Hgb — ABNORMAL HIGH (ref <5.7)
Mean Plasma Glucose: 134 mg/dL
eAG (mmol/L): 7.4 mmol/L

## 2021-03-08 LAB — COMPLETE METABOLIC PANEL WITH GFR
AG Ratio: 1.6 (calc) (ref 1.0–2.5)
ALT: 12 U/L (ref 6–29)
AST: 17 U/L (ref 10–35)
Albumin: 3.8 g/dL (ref 3.6–5.1)
Alkaline phosphatase (APISO): 136 U/L (ref 37–153)
BUN: 14 mg/dL (ref 7–25)
CO2: 31 mmol/L (ref 20–32)
Calcium: 9.3 mg/dL (ref 8.6–10.4)
Chloride: 105 mmol/L (ref 98–110)
Creat: 0.74 mg/dL (ref 0.60–0.95)
Globulin: 2.4 g/dL (calc) (ref 1.9–3.7)
Glucose, Bld: 100 mg/dL — ABNORMAL HIGH (ref 65–99)
Potassium: 4.2 mmol/L (ref 3.5–5.3)
Sodium: 143 mmol/L (ref 135–146)
Total Bilirubin: 0.6 mg/dL (ref 0.2–1.2)
Total Protein: 6.2 g/dL (ref 6.1–8.1)
eGFR: 80 mL/min/{1.73_m2} (ref >=60)

## 2021-03-08 LAB — LIPID PANEL
Cholesterol: 92 mg/dL (ref <200)
HDL: 37 mg/dL — ABNORMAL LOW (ref >=50)
LDL Cholesterol (Calc): 41 mg/dL (calc)
Non-HDL Cholesterol (Calc): 55 mg/dL (calc) (ref <130)
Total CHOL/HDL Ratio: 2.5 (calc) (ref <5.0)
Triglycerides: 48 mg/dL (ref <150)

## 2021-03-08 LAB — CBC WITH DIFFERENTIAL/PLATELET
Absolute Monocytes: 451 cells/uL (ref 200–950)
Basophils Absolute: 43 cells/uL (ref 0–200)
Basophils Relative: 0.7 %
Eosinophils Absolute: 67 cells/uL (ref 15–500)
Eosinophils Relative: 1.1 %
HCT: 45.1 % — ABNORMAL HIGH (ref 35.0–45.0)
Hemoglobin: 14.2 g/dL (ref 11.7–15.5)
Lymphs Abs: 1379 cells/uL (ref 850–3900)
MCH: 26.7 pg — ABNORMAL LOW (ref 27.0–33.0)
MCHC: 31.5 g/dL — ABNORMAL LOW (ref 32.0–36.0)
MCV: 84.9 fL (ref 80.0–100.0)
MPV: 10.3 fL (ref 7.5–12.5)
Monocytes Relative: 7.4 %
Neutro Abs: 4160 cells/uL (ref 1500–7800)
Neutrophils Relative %: 68.2 %
Platelets: 329 10*3/uL (ref 140–400)
RBC: 5.31 10*6/uL — ABNORMAL HIGH (ref 3.80–5.10)
RDW: 13.3 % (ref 11.0–15.0)
Total Lymphocyte: 22.6 %
WBC: 6.1 10*3/uL (ref 3.8–10.8)

## 2021-03-08 LAB — VITAMIN B12: Vitamin B-12: 810 pg/mL (ref 200–1100)

## 2021-03-08 LAB — VITAMIN D 25 HYDROXY (VIT D DEFICIENCY, FRACTURES): Vit D, 25-Hydroxy: 29 ng/mL — ABNORMAL LOW (ref 30–100)

## 2021-04-05 ENCOUNTER — Other Ambulatory Visit: Payer: Self-pay | Admitting: Family Medicine

## 2021-04-05 DIAGNOSIS — E782 Mixed hyperlipidemia: Secondary | ICD-10-CM

## 2021-04-28 ENCOUNTER — Other Ambulatory Visit: Payer: Self-pay | Admitting: Family Medicine

## 2021-04-28 DIAGNOSIS — K219 Gastro-esophageal reflux disease without esophagitis: Secondary | ICD-10-CM

## 2021-05-09 ENCOUNTER — Ambulatory Visit: Payer: Self-pay

## 2021-05-09 ENCOUNTER — Encounter: Payer: Self-pay | Admitting: Internal Medicine

## 2021-05-09 ENCOUNTER — Ambulatory Visit (INDEPENDENT_AMBULATORY_CARE_PROVIDER_SITE_OTHER): Payer: Medicare Other | Admitting: Internal Medicine

## 2021-05-09 VITALS — BP 144/72 | HR 74 | Temp 97.6°F | Resp 16 | Ht 63.0 in | Wt 141.9 lb

## 2021-05-09 DIAGNOSIS — I10 Essential (primary) hypertension: Secondary | ICD-10-CM

## 2021-05-09 MED ORDER — BLOOD PRESSURE MONITOR KIT: 1.0000 | PACK | Freq: Every day | 0 refills | Status: DC

## 2021-05-09 NOTE — Telephone Encounter (Signed)
?  Chief Complaint: HTN - 160/70 ?Symptoms: HA ?Frequency: BP taken yesterday ?Pertinent Negatives: Patient denies weakness ?Disposition: '[]'$ ED /'[]'$ Urgent Care (no appt availability in office) / '[x]'$ Appointment(In office/virtual)/ '[]'$  [place] [facility]/ '[]'$ Home Care/ '[]'$ Refused Recommended Disposition /'[]'$ [facility]/ '[]'$  Follow-up with PCP ?Additional Notes: Pt thinks her HTN medication is not quite right. BP taken yesterday. Pt does not have a home BP measuring device.  ? ? ? ?Reason for Disposition ? Systolic BP  >= 570 OR Diastolic >= 177 ? ?Answer Assessment - Initial Assessment Questions ?1. BLOOD PRESSURE: "What is the blood pressure?" "Did you take at least two measurements 5 minutes apart?" ?    160/70 - yesterday ?2. ONSET: "When did you take your blood pressure?" ?    yesterday ?3. HOW: "How did you obtain the blood pressure?" (e.g., visiting nurse, automatic home BP monitor) ?    At office ?4. HISTORY: "Do you have a history of high blood pressure?" ?    yes ?5. MEDICATIONS: "Are you taking any medications for blood pressure?" "Have you missed any doses recently?" ?    Thinks meds aren't quite right ?6. OTHER SYMPTOMS: "Do you have any symptoms?" (e.g., headache, chest pain, blurred vision, difficulty breathing, weakness) ?    HA ?7. PREGNANCY: "Is there any chance you are pregnant?" "When was your last menstrual period?" ?    na ? ?Protocols used: Blood Pressure - High-A-AH ? ?

## 2021-05-09 NOTE — Assessment & Plan Note (Signed)
Blood pressure above goal today, had been higher at other visits. Increase Losartan to 50 mg daily and take Amlodipine 2.5 mg at night. Sent new blood pressure cuff to pharmacy so she can monitor blood pressure at home. Has follow up in 1 month to recheck.  ?

## 2021-05-09 NOTE — Progress Notes (Signed)
? ?Acute Office Visit ? ?Subjective:  ? ? Patient ID: Catherine Burgess, female    DOB: 1935/10/05, 86 y.o.   MRN: [id number] ? ?Chief Complaint  ?Patient presents with  ? Hypertension  ?  W/ some headaches  ? ? ?HPI ?Patient is in today for concerns about high blood pressure.  ? ?Hypertension: ?-Medications: Losartan 25, Amlodipine 2.5. She had been on a higher dose of Losartan but her blood pressure was high at [place] in January at 160/78 and the Amlodipine was added back.  ?-Patient is compliant with above medications and reports no side effects. ?-Checking BP at home (average): Blood pressure cuff broke so not checking at home regularly but it was >160 last week and again yesterday it was high at 160/70 at a different office visit   ?-Denies any SOB, CP, vision changes, LE edema or symptoms of hypotension. Does endorse headaches  ? ?Past Medical History:  ?Diagnosis Date  ? Degenerative joint disease   ? GERD (gastroesophageal reflux disease)   ? Hyperlipidemia   ? Hypertension   ? Malignant essential hypertension 07/23/2016  ? Osteoarthrosis   ? Reflux esophagitis   ? ? ?Past Surgical History:  ?Procedure Laterality Date  ? CATARACT EXTRACTION, BILATERAL    ? COLONOSCOPY WITH PROPOFOL N/A 06/20/2020  ? Procedure: COLONOSCOPY WITH PROPOFOL;  Surgeon: Lin Landsman, MD;  Location: [facility] ENDOSCOPY;  Service: Gastroenterology;  Laterality: N/A;  ? EYE SURGERY    ? HIP PINNING Left 1956  ? JOINT REPLACEMENT    ? REPLACEMENT TOTAL KNEE Left 11/12/2009  ? TOTAL KNEE REVISION Left 12/07/2016  ? Procedure: TOTAL KNEE REVISION;  Surgeon: Dereck Leep, MD;  Location: [facility] ORS;  Service: Orthopedics;  Laterality: Left;  ? ? ?Family History  ?Problem Relation Age of Onset  ? Emphysema Father   ?     Smoker  ? CAD Mother   ? Stroke Mother   ? Diabetes Brother   ? Seizures Brother   ? Cancer Brother   ?     unknown  ? Diabetes Brother   ? ? ?Social History  ? ?Socioeconomic History  ? Marital status: Divorced  ?  Spouse  name: Not on file  ? Number of children: 1  ? Years of education: Not on file  ? Highest education level: 12th grade  ?Occupational History  ? Occupation: Retired  ?Tobacco Use  ? Smoking status: Former  ?  Packs/day: 0.25  ?  Years: 1.00  ?  Pack years: 0.25  ?  Types: Cigarettes  ?  Quit date: 1966  ?  Years since quitting: 57.2  ? Smokeless tobacco: Never  ? Tobacco comments:  ?  smoking cessation materials not required  ?Vaping Use  ? Vaping Use: Never used  ?Substance and Sexual Activity  ? Alcohol use: No  ?  Alcohol/week: 0.0 standard drinks  ? Drug use: No  ? Sexual activity: Not Currently  ?Other Topics Concern  ? Not on file  ?Social History Narrative  ? Pt lives alone.   ? ?Social Determinants of Health  ? ?Financial Resource Strain: Not on file  ?Food Insecurity: Not on file  ?Transportation Needs: Not on file  ?Physical Activity: Not on file  ?Stress: Not on file  ?Social Connections: Not on file  ?Intimate Partner Violence: Not on file  ? ? ?Outpatient Medications Prior to Visit  ?Medication Sig Dispense Refill  ? pantoprazole (PROTONIX) 40 MG tablet TAKE 1 TABLET(40 MG) BY MOUTH  DAILY 90 tablet 1  ? acetaminophen (TYLENOL) 500 MG tablet Take 1 tablet (500 mg total) by mouth 2 (two) times daily. (Patient taking differently: Take 500 mg by mouth every 6 (six) hours as needed for mild pain.) 180 tablet 0  ? amLODipine (NORVASC) 2.5 MG tablet Take 1 tablet (2.5 mg total) by mouth daily. 90 tablet 0  ? aspirin EC 81 MG tablet Take 81 mg by mouth.    ? atorvastatin (LIPITOR) 40 MG tablet TAKE 1 TABLET(40 MG) BY MOUTH DAILY 90 tablet 0  ? baclofen (LIORESAL) 20 MG tablet Take 1 tablet (20 mg total) by mouth at bedtime as needed for muscle spasms. 90 each 0  ? Calcium-Vitamin D 600-200 MG-UNIT tablet Take 1 tablet by mouth daily.     ? desonide (DESOWEN) 0.05 % cream Apply topically 2 (two) times daily. 30 g 0  ? gabapentin (NEURONTIN) 300 MG capsule Take 1-2 capsules (300-600 mg total) by mouth 2 (two) times  daily. Take 370m in the morning and 600 mg at bedtime 270 capsule 1  ? loratadine (CLARITIN) 10 MG tablet Take 1 tablet (10 mg total) by mouth daily. 30 tablet 0  ? losartan (COZAAR) 25 MG tablet Take 1 tablet (25 mg total) by mouth daily. 90 tablet 0  ? vitamin B-12 (CYANOCOBALAMIN) 1000 MCG tablet Take 1 tablet (1,000 mcg total) by mouth 3 (three) times a week. 12 tablet 0  ? ?No facility-administered medications prior to visit.  ? ? ?Allergies  ?Allergen Reactions  ? Lovastatin Other (See Comments)  ?  chest  ? Pravastatin   ?  Other reaction(s): Muscle Pain ?chest  ? Rosuvastatin   ?  Other reaction(s): Muscle Pain ?chest ?Other reaction(s): Muscle Pain ?chest  ? Statins   ?  Other reaction(s): Muscle Pain ?chest  ? ? ?Review of Systems  ?Constitutional:  Negative for chills and fever.  ?Eyes:  Negative for visual disturbance.  ?Respiratory:  Negative for cough and shortness of breath.   ?Cardiovascular:  Negative for chest pain and leg swelling.  ?Neurological:  Positive for headaches. Negative for dizziness.  ? ?   ?Objective:  ?  ?Physical Exam ?Constitutional:   ?   Appearance: Normal appearance.  ?HENT:  ?   Head: Normocephalic and atraumatic.  ?Eyes:  ?   Conjunctiva/sclera: Conjunctivae normal.  ?Cardiovascular:  ?   Rate and Rhythm: Normal rate and regular rhythm.  ?Pulmonary:  ?   Effort: Pulmonary effort is normal.  ?   Breath sounds: Normal breath sounds.  ?Musculoskeletal:  ?   Right lower leg: No edema.  ?   Left lower leg: No edema.  ?Skin: ?   General: Skin is warm and dry.  ?Neurological:  ?   General: No focal deficit present.  ?   Mental Status: She is alert. Mental status is at baseline.  ?Psychiatric:     ?   Mood and Affect: Mood normal.     ?   Behavior: Behavior normal.  ? ? ?BP (!) 144/72   Pulse 74   Temp 97.6 ?F (36.4 ?C)   Resp 16   Ht _0  (1.6 m)   Wt 141 lb 14.4 oz (64.4 kg)   SpO2 98%   BMI 25.14 kg/m?  ?Wt Readings from Last 3 Encounters:  ?05/09/21 141 lb 14.4 oz (64.4  kg)  ?03/07/21 142 lb 6.4 oz (64.6 kg)  ?11/25/20 146 lb (66.2 kg)  ? ?Vitals:  ? 05/09/21 1104  ?BP: ([name]  144/72  ? ? ?Health Maintenance Due  ?Topic Date Due  ? Zoster Vaccines- Shingrix (1 of 2) Never done  ? COVID-19 Vaccine (4 - Booster for Pfizer series) 02/12/2020  ? ? ?There are no preventive care reminders to display for this patient. ? ? ?Lab Results  ?Component Value Date  ? TSH 1.630 07/23/2016  ? ?Lab Results  ?Component Value Date  ? WBC 6.1 03/07/2021  ? HGB 14.2 03/07/2021  ? HCT 45.1 (H) 03/07/2021  ? MCV 84.9 03/07/2021  ? PLT 329 03/07/2021  ? ?Lab Results  ?Component Value Date  ? NA 143 03/07/2021  ? K 4.2 03/07/2021  ? CO2 31 03/07/2021  ? GLUCOSE 100 (H) 03/07/2021  ? BUN 14 03/07/2021  ? CREATININE 0.74 03/07/2021  ? BILITOT 0.6 03/07/2021  ? ALKPHOS 133 (H) 01/24/2017  ? AST 17 03/07/2021  ? ALT 12 03/07/2021  ? PROT 6.2 03/07/2021  ? ALBUMIN 3.7 01/24/2017  ? CALCIUM 9.3 03/07/2021  ? ANIONGAP 8 06/13/2017  ? EGFR 80 03/07/2021  ? ?Lab Results  ?Component Value Date  ? CHOL 92 03/07/2021  ? ?Lab Results  ?Component Value Date  ? HDL 37 (L) 03/07/2021  ? ?Lab Results  ?Component Value Date  ? [place] 41 03/07/2021  ? ?Lab Results  ?Component Value Date  ? TRIG 48 03/07/2021  ? ?Lab Results  ?Component Value Date  ? CHOLHDL 2.5 03/07/2021  ? ?Lab Results  ?Component Value Date  ? HGBA1C 6.3 (H) 03/07/2021  ? ? ?   ?Assessment & Plan:  ? ?Problem List Items Addressed This Visit   ? ?  ? Cardiovascular and Mediastinum  ? Hypertension - Primary  ?  Blood pressure above goal today, had been higher at other visits. Increase Losartan to 50 mg daily and take Amlodipine 2.5 mg at night. Sent new blood pressure cuff to pharmacy so she can monitor blood pressure at home. Has follow up in 1 month to recheck.  ?  ?  ? Relevant Medications  ? Blood Pressure Monitor KIT  ? losartan (COZAAR) 50 MG tablet  ? ? ?Meds ordered this encounter  ?Medications  ? Blood Pressure Monitor KIT  ?  Sig: 1 each by Does not  apply route daily.  ?  Dispense:  1 kit  ?  Refill:  0  ? ? ?Teodora Medici, DO ? ?

## 2021-05-09 NOTE — Patient Instructions (Addendum)
It was great seeing you today! ? ?Plan discussed at today's visit: ?-Losartan increased to 50 mg (can cut 100 mg tablet in half) in the morning and Amlodipine 2.5 mg at night  ?-Will send a blood pressure cuff to your pharmacy, start checking a few times a week and write them down to bring to follow up  ?-If blood pressure >200/100 and you have symptoms like a headache or chest pain, please go to the emergency room  ? ?Follow up in: 1 month ? ?Take care and let us know if you have any questions or concerns prior to your next visit. ? ?Dr. Rosana Berger ? ?

## 2021-05-11 DIAGNOSIS — E119 Type 2 diabetes mellitus without complications: Secondary | ICD-10-CM | POA: Insufficient documentation

## 2021-05-11 DIAGNOSIS — R194 Change in bowel habit: Secondary | ICD-10-CM | POA: Insufficient documentation

## 2021-05-26 ENCOUNTER — Encounter (INDEPENDENT_AMBULATORY_CARE_PROVIDER_SITE_OTHER): Payer: Medicare Other

## 2021-05-26 ENCOUNTER — Ambulatory Visit (INDEPENDENT_AMBULATORY_CARE_PROVIDER_SITE_OTHER): Payer: Medicare Other | Admitting: Vascular Surgery

## 2021-06-06 ENCOUNTER — Ambulatory Visit (INDEPENDENT_AMBULATORY_CARE_PROVIDER_SITE_OTHER): Payer: Medicare Other | Admitting: Family Medicine

## 2021-06-06 ENCOUNTER — Encounter: Payer: Self-pay | Admitting: Family Medicine

## 2021-06-06 VITALS — BP 128/80 | HR 72 | Temp 97.8°F | Resp 16 | Ht 63.0 in | Wt 143.2 lb

## 2021-06-06 DIAGNOSIS — M542 Cervicalgia: Secondary | ICD-10-CM

## 2021-06-06 DIAGNOSIS — K219 Gastro-esophageal reflux disease without esophagitis: Secondary | ICD-10-CM

## 2021-06-06 DIAGNOSIS — I10 Essential (primary) hypertension: Secondary | ICD-10-CM

## 2021-06-06 DIAGNOSIS — Z8673 Personal history of transient ischemic attack (TIA), and cerebral infarction without residual deficits: Secondary | ICD-10-CM

## 2021-06-06 DIAGNOSIS — E538 Deficiency of other specified B group vitamins: Secondary | ICD-10-CM

## 2021-06-06 DIAGNOSIS — D320 Benign neoplasm of cerebral meninges: Secondary | ICD-10-CM | POA: Diagnosis not present

## 2021-06-06 DIAGNOSIS — M5090 Cervical disc disorder, unspecified, unspecified cervical region: Secondary | ICD-10-CM

## 2021-06-06 DIAGNOSIS — G5730 Lesion of lateral popliteal nerve, unspecified lower limb: Secondary | ICD-10-CM

## 2021-06-06 DIAGNOSIS — E1169 Type 2 diabetes mellitus with other specified complication: Secondary | ICD-10-CM

## 2021-06-06 DIAGNOSIS — G629 Polyneuropathy, unspecified: Secondary | ICD-10-CM | POA: Diagnosis not present

## 2021-06-06 DIAGNOSIS — E785 Hyperlipidemia, unspecified: Secondary | ICD-10-CM

## 2021-06-06 DIAGNOSIS — M170 Bilateral primary osteoarthritis of knee: Secondary | ICD-10-CM

## 2021-06-06 MED ORDER — GABAPENTIN 300 MG PO CAPS: 300.0000 mg | ORAL_CAPSULE | Freq: Two times a day (BID) | ORAL | 1 refills | Status: DC

## 2021-06-06 MED ORDER — AMLODIPINE BESYLATE 2.5 MG PO TABS: 2.5000 mg | ORAL_TABLET | Freq: Every day | ORAL | 0 refills | Status: DC

## 2021-06-06 MED ORDER — LOSARTAN POTASSIUM 50 MG PO TABS: 50.0000 mg | ORAL_TABLET | Freq: Every day | ORAL | 1 refills | Status: DC

## 2021-06-06 NOTE — Progress Notes (Signed)
Name: Catherine Burgess   MRN: [id number]    DOB: 1935/09/08   Date:06/06/2021 ? ?     Progress Note ? ?Subjective ? ?Chief Complaint ? ?Chief Complaint  ?Patient presents with  ? Follow-up  ? Hypertension  ? ? ?HPI ?  ?Abnormal MRI brain from 2018 also abnormal CT brain from 06/2017. CT showed calcified meningioma, she denies headaches, dizziness or neuro deficitis, she saw Dr. Manuella Ghazi in Feb but he did not discuss follow up brain CT.  ? ?Neuropathy: she takes gabapentin and tingling and numbness has resolved. She stopped taking B12 because she thought it was causing muscle cramps ( they happen seldom and takes baclofen prn)  and gabapentin . Advised to resume B12  ?  ?MC:NOBSJ well at this time, no symptoms  ? ?GERD: she is taking Pantoprazole once a day, no heartburn or indigestion now. She was seen by dr. Marius Ditch in May and had repeat colonoscopy due to change in bowel movements ( she also has a history of volvulus ) , colonoscopy showed diverticulosis but no other problems She states bowel movements goes from constipation to loose stools but no abdominal pain, blood or mucus on stools.  ?  ?OA/s/p left knee replacement: she has intermittent pain, taking Tylenol prn now, no effusion or redness. Takes Meloxicam prn only , very seldom now  ?  ?Hyperlipidemia: taking medication, denies myalgia, LDL has been at goal at 41  ? ?HTN: currently doing well on losartan 50 mg and norvasc 2.5 mg, no side effects. Recently seen by Dr. Clayborn Bigness for chest tightness but symptoms resolved. Denies headaches, dizziness ?  ?Osteopenia: reviewed last bone density 2020, continue high calcium diet , and take vitamin D 2000 units daily , also needs to continue being physically active. Discussed repeating bone density but she would like to hold off for now  Unchanged  ? ?Diabetes diet controlled:  last A1C was 6.4 %, she denies polyphagia, polydipsia or polyuria. Eye exam has been scheduled. She takes statins and ARB ? ? ?Patient Active  Problem List  ? Diagnosis Date Noted  ? Diabetes mellitus type 2, diet-controlled ([place]) 05/11/2021  ? Altered bowel habits   ? Pre-diabetes 10/02/2018  ? Abnormal ankle brachial index (ABI) 03/02/2018  ? Calcified cerebral meningioma ([place]) 10/04/2017  ? History of CVA (cerebrovascular accident) without residual deficits 10/04/2017  ? Subacromial impingement of right shoulder 10/04/2017  ? History of anemia 10/13/2016  ? Syncope 07/23/2016  ? Abnormal CT of brain 07/23/2016  ? Vitamin D deficiency 10/28/2015  ? Post menopausal syndrome 10/28/2015  ? Osteopenia after menopause 10/28/2015  ? Cervical disc disease 10/28/2015  ? H/O total knee replacement 01/20/2015  ? Vaginal atrophy 09/26/2014  ? Osteoarthritis of both knees 07/31/2014  ? GERD without esophagitis 07/31/2014  ? HLD (hyperlipidemia) 07/31/2014  ? Hypertension 07/31/2014  ? ? ?Past Surgical History:  ?Procedure Laterality Date  ? CATARACT EXTRACTION, BILATERAL    ? COLONOSCOPY WITH PROPOFOL N/A 06/20/2020  ? Procedure: COLONOSCOPY WITH PROPOFOL;  Surgeon: Lin Landsman, MD;  Location: [facility] ENDOSCOPY;  Service: Gastroenterology;  Laterality: N/A;  ? EYE SURGERY    ? HIP PINNING Left 1956  ? JOINT REPLACEMENT    ? REPLACEMENT TOTAL KNEE Left 11/12/2009  ? TOTAL KNEE REVISION Left 12/07/2016  ? Procedure: TOTAL KNEE REVISION;  Surgeon: Dereck Leep, MD;  Location: [facility] ORS;  Service: Orthopedics;  Laterality: Left;  ? ? ?Family History  ?Problem Relation Age of Onset  ?  Emphysema Father   ?     Smoker  ? CAD Mother   ? Stroke Mother   ? Diabetes Brother   ? Seizures Brother   ? Cancer Brother   ?     unknown  ? Diabetes Brother   ? ? ?Social History  ? ?Tobacco Use  ? Smoking status: Former  ?  Packs/day: 0.25  ?  Years: 1.00  ?  Pack years: 0.25  ?  Types: Cigarettes  ?  Quit date: 1966  ?  Years since quitting: 57.3  ? Smokeless tobacco: Never  ? Tobacco comments:  ?  smoking cessation materials not required  ?Substance Use Topics  ? Alcohol use:  No  ?  Alcohol/week: 0.0 standard drinks  ? ? ? ?Current Outpatient Medications:  ?  acetaminophen (TYLENOL) 500 MG tablet, Take 1 tablet (500 mg total) by mouth 2 (two) times daily. (Patient taking differently: Take 500 mg by mouth every 6 (six) hours as needed for mild pain.), Disp: 180 tablet, Rfl: 0 ?  aspirin EC 81 MG tablet, Take 81 mg by mouth., Disp: , Rfl:  ?  atorvastatin (LIPITOR) 40 MG tablet, TAKE 1 TABLET(40 MG) BY MOUTH DAILY, Disp: 90 tablet, Rfl: 0 ?  baclofen (LIORESAL) 20 MG tablet, Take 1 tablet (20 mg total) by mouth at bedtime as needed for muscle spasms., Disp: 90 each, Rfl: 0 ?  Blood Glucose Monitoring Suppl (ACCU-CHEK GUIDE) w/Device KIT, See admin instructions., Disp: , Rfl:  ?  Blood Pressure Monitor KIT, 1 each by Does not apply route daily., Disp: 1 kit, Rfl: 0 ?  Calcium-Vitamin D 600-200 MG-UNIT tablet, Take 1 tablet by mouth daily. , Disp: , Rfl:  ?  meloxicam (MOBIC) 15 MG tablet, Take 15 mg by mouth daily., Disp: , Rfl:  ?  pantoprazole (PROTONIX) 40 MG tablet, TAKE 1 TABLET(40 MG) BY MOUTH DAILY, Disp: 90 tablet, Rfl: 1 ?  amLODipine (NORVASC) 2.5 MG tablet, Take 1 tablet (2.5 mg total) by mouth daily., Disp: 90 tablet, Rfl: 0 ?  gabapentin (NEURONTIN) 300 MG capsule, Take 1-2 capsules (300-600 mg total) by mouth 2 (two) times daily. Take 363m in the morning and 600 mg at bedtime, Disp: 270 capsule, Rfl: 1 ?  loratadine (CLARITIN) 10 MG tablet, Take 1 tablet (10 mg total) by mouth daily. (Patient not taking: Reported on 06/06/2021), Disp: 30 tablet, Rfl: 0 ?  losartan (COZAAR) 50 MG tablet, Take 1 tablet (50 mg total) by mouth daily., Disp: 90 tablet, Rfl: 1 ?  vitamin B-12 (CYANOCOBALAMIN) 1000 MCG tablet, Take 1 tablet (1,000 mcg total) by mouth 3 (three) times a week. (Patient not taking: Reported on 06/06/2021), Disp: 12 tablet, Rfl: 0 ? ?Allergies  ?Allergen Reactions  ? Lovastatin Other (See Comments)  ?  chest  ? Pravastatin   ?  Other reaction(s): Muscle Pain ?chest  ?  Rosuvastatin   ?  Other reaction(s): Muscle Pain ?chest ?Other reaction(s): Muscle Pain ?chest  ? Statins   ?  Other reaction(s): Muscle Pain ?chest  ? ? ?I personally reviewed active problem list, medication list, allergies, family history, social history with the patient/caregiver today. ? ? ?ROS ? ?Constitutional: Negative for fever or weight change.  ?Respiratory: Negative for cough and shortness of breath.   ?Cardiovascular: Negative for chest pain or palpitations.  ?Gastrointestinal: Negative for abdominal pain, no bowel changes.  ?Musculoskeletal: positive for gait problem or joint swelling.  ?Skin: Negative for rash.  ?Neurological: Negative for dizziness or  headache.  ?No other specific complaints in a complete review of systems (except as listed in HPI above).  ? ?Objective ? ?Vitals:  ? 06/06/21 1144  ?BP: 128/80  ?Pulse: 72  ?Resp: 16  ?Temp: 97.8 ?F (36.6 ?C)  ?TempSrc: Oral  ?SpO2: 97%  ?Weight: 143 lb 3.2 oz (65 kg)  ?Height: _0  (1.6 m)  ? ? ?Body mass index is 25.37 kg/m?. ? ?Physical Exam ? ?Constitutional: Patient appears well-developed and well-nourished.  No distress.  ?HEENT: head atraumatic, normocephalic, pupils equal and reactive to light, neck supple ?Cardiovascular: Normal rate, regular rhythm and normal heart sounds.  No murmur heard. 1 plus  BLE edema. ?Pulmonary/Chest: Effort normal and breath sounds normal. No respiratory distress. ?Abdominal: Soft.  There is no tenderness. ?Psychiatric: Patient has a normal mood and affect. behavior is normal. Judgment and thought content normal.  ? ?Diabetic Foot Exam: ?Diabetic Foot Exam - Simple   ?Simple Foot Form ?Visual Inspection ?No deformities, no ulcerations, no other skin breakdown bilaterally: Yes ?Sensation Testing ?Intact to touch and monofilament testing bilaterally: Yes ?Pulse Check ?Posterior Tibialis and Dorsalis pulse intact bilaterally: Yes ?Comments ?  ? ? ? ?PHQ2/9: ? ?  05/09/2021  ? 11:09 AM 03/07/2021  ? 10:15 AM 11/04/2020  ?   2:12 PM 07/12/2020  ? 10:53 AM 04/22/2020  ? 11:12 AM  ?Depression screen PHQ 2/9  ?Decreased Interest 0 0 0 0 0  ?Down, Depressed, Hopeless 0 0 0 0 0  ?PHQ - 2 Score 0 0 0 0 0  ?Altered sleeping 0 0     ?Tired, d

## 2021-06-07 LAB — MICROALBUMIN / CREATININE URINE RATIO
Creatinine, Urine: 109 mg/dL (ref 20–275)
Microalb Creat Ratio: 15 mcg/mg creat (ref <30)
Microalb, Ur: 1.6 mg/dL

## 2021-06-30 ENCOUNTER — Encounter: Payer: Self-pay | Admitting: Nurse Practitioner

## 2021-06-30 ENCOUNTER — Ambulatory Visit (INDEPENDENT_AMBULATORY_CARE_PROVIDER_SITE_OTHER): Payer: Medicare Other | Admitting: Nurse Practitioner

## 2021-06-30 VITALS — BP 136/72 | HR 84 | Temp 98.2°F | Resp 16 | Ht 63.0 in | Wt 137.6 lb

## 2021-06-30 DIAGNOSIS — J0141 Acute recurrent pansinusitis: Secondary | ICD-10-CM

## 2021-06-30 DIAGNOSIS — R051 Acute cough: Secondary | ICD-10-CM

## 2021-06-30 MED ORDER — PREDNISONE 10 MG (21) PO TBPK: ORAL_TABLET | ORAL | 0 refills | Status: DC

## 2021-06-30 MED ORDER — BENZONATATE 200 MG PO CAPS: 200.0000 mg | ORAL_CAPSULE | Freq: Three times a day (TID) | ORAL | 0 refills | Status: DC | PRN

## 2021-06-30 NOTE — Progress Notes (Signed)
BP 136/72   Pulse 84   Temp 98.2 F (36.8 C)   Resp 16   Ht '5\' 3"'$  (1.6 m)   Wt 137 lb 9.6 oz (62.4 kg)   SpO2 98%   BMI 24.37 kg/m    Subjective:    Patient ID: Catherine Burgess, female    DOB: March 28, 1935, 86 y.o.   MRN: [id number]  HPI: Catherine Burgess is a 86 y.o. female  Chief Complaint  Patient presents with   Sinusitis    Is on antibiotic , but needs something for cough   Sinusitis/cough: Patient was seen at [facility] clinic on 06/16/2021.  Tested for COVID and flu and was negative.  She was prescribed Flonase, Tessalon Perles, and doxycycline. She says she never got the [employer]  She denies any fever or shortness of breath.  We will resend prescription for Tessalon Perles to help with the cough.  She says she still feels inflammation in her face.  We will send in steroid pack.   Relevant past medical, surgical, family and social history reviewed and updated as indicated. Interim medical history since our last visit reviewed. Allergies and medications reviewed and updated.  Review of Systems  Constitutional: Negative for fever or weight change.  HEENT: Positive for nasal congestion and facial fullness Respiratory: Positive for cough and negative shortness of breath.   Cardiovascular: Negative for chest pain or palpitations.  Gastrointestinal: Negative for abdominal pain, no bowel changes.  Musculoskeletal: Negative for gait problem or joint swelling.  Skin: Negative for rash.  Neurological: Negative for dizziness or headache.  No other specific complaints in a complete review of systems (except as listed in HPI above).      Objective:    BP 136/72   Pulse 84   Temp 98.2 F (36.8 C)   Resp 16   Ht '5\' 3"'$  (1.6 m)   Wt 137 lb 9.6 oz (62.4 kg)   SpO2 98%   BMI 24.37 kg/m   Wt Readings from Last 3 Encounters:  06/30/21 137 lb 9.6 oz (62.4 kg)  06/06/21 143 lb 3.2 oz (65 kg)  05/09/21 141 lb 14.4 oz (64.4 kg)    Physical Exam  Constitutional:  Patient appears well-developed and well-nourished.  No distress.  HEENT: head atraumatic, normocephalic, pupils equal and reactive to light, ears TMs clear, neck supple, throat within normal limits Cardiovascular: Normal rate, regular rhythm and normal heart sounds.  No murmur heard. No BLE edema. Pulmonary/Chest: Effort normal and breath sounds normal. No respiratory distress. Abdominal: Soft.  There is no tenderness. Psychiatric: Patient has a normal mood and affect. behavior is normal. Judgment and thought content normal.  Results for orders placed or performed in visit on 06/06/21  Urine Microalbumin w/creat. ratio  Result Value Ref Range   Creatinine, Urine 109 20 - 275 mg/dL   Microalb, Ur 1.6 mg/dL   Microalb Creat Ratio 15 <30 mcg/mg creat      Assessment & Plan:   Problem List Items Addressed This Visit   None Visit Diagnoses     Acute cough    -  Primary   Take Tessalon Perles.  Push fluids and get rest.   Relevant Medications   benzonatate (TESSALON) 200 MG capsule   Acute recurrent pansinusitis       Finish up antibiotic.  Take steroid pack.   Relevant Medications   doxycycline (VIBRAMYCIN) 100 MG capsule   fluticasone (FLONASE) 50 MCG/ACT nasal spray   benzonatate (TESSALON) 200  MG capsule   predniSONE (STERAPRED UNI-PAK 21 TAB) 10 MG (21) TBPK tablet        Follow up plan: Return if symptoms worsen or fail to improve.

## 2021-07-17 ENCOUNTER — Other Ambulatory Visit: Payer: Self-pay | Admitting: Family Medicine

## 2021-07-17 DIAGNOSIS — E782 Mixed hyperlipidemia: Secondary | ICD-10-CM

## 2021-09-26 NOTE — Progress Notes (Unsigned)
Name: Catherine Burgess   MRN: [id number]    DOB: 1935/07/05   Date:09/29/2021       Progress Note  Subjective  Chief Complaint  Follow Up  HPI  Abnormal MRI brain from 2018 also abnormal CT brain from 06/2017. CT showed calcified meningioma, she denies headaches, dizziness or neuro deficitis, she saw Dr. Manuella Ghazi in Feb but he did not discuss follow up brain CT. Advised her to discuss it with Dr. Manuella Ghazi  Last syncope was 2019   Neuropathy: seen by neurologist, she takes gabapentin TID  and tingling and numbness has resolved. She stopped taking B12 because she thought it was causing muscle cramps ( they happen seldom and takes baclofen prn)  and gabapentin . Last B12 was at goal, she is taking it a few times a week   She uses a cane to assist with her balance intermittently    JM:EQAST well at this time.   GERD: she is taking Pantoprazole once a day, no heartburn or indigestion now. She was seen by dr. Marius Ditch in May and had repeat colonoscopy due to change in bowel movements ( she also has a history of volvulus ) , colonoscopy showed diverticulosis but no other problems She states bowel movements goes from constipation to loose stools but no abdominal pain, blood or mucus on stools.    OA/s/p left knee replacement: she has intermittent pain, taking Tylenol prn now, no effusion or redness. 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 She states the pain is more like soreness and usually when more active, it resolves with tylenol - she states muscle relaxer also helps with the pain    Osteopenia: reviewed last bone density  2020, continue high calcium diet , and take vitamin D 2000 units daily , also needs to continue being physically active. Advised to get bone density done   Diabetes diet controlled:  last A1C was 6.4 % today is down to 6.1 % she denies polyphagia, polydipsia or polyuria.  She takes statins and ARB.   Change in bowel movement: she states she has been eating more fruit and vegetables and noticed stool bristol scale of 5 most of the time, only takes miralax prn. No blood or mucus in stools. Appetite is normal, she had a normal colonoscopy last year, discussed referring her back to gi but she wants to monitor for now   Patient Active Problem List   Diagnosis Date Noted   Diabetes mellitus type 2, diet-controlled ([place]) 05/11/2021   Altered bowel habits    Pre-diabetes 10/02/2018   Abnormal ankle brachial index (ABI) 03/02/2018   Calcified cerebral meningioma ([place]) 10/04/2017   History of CVA (cerebrovascular accident) without residual deficits 10/04/2017   Subacromial impingement of right shoulder 10/04/2017   History of anemia 10/13/2016   Syncope 07/23/2016   Abnormal CT of brain 07/23/2016   Vitamin D deficiency 10/28/2015   Post menopausal syndrome 10/28/2015   Osteopenia after menopause 10/28/2015   Cervical disc disease 10/28/2015   H/O total knee replacement 01/20/2015   Vaginal atrophy 09/26/2014   Osteoarthritis  of both knees 07/31/2014   GERD without esophagitis 07/31/2014   HLD (hyperlipidemia) 07/31/2014   Hypertension 07/31/2014    Past Surgical History:  Procedure Laterality Date   CATARACT EXTRACTION, BILATERAL     COLONOSCOPY WITH PROPOFOL N/A 06/20/2020   Procedure: COLONOSCOPY WITH PROPOFOL;  Surgeon: Lin Landsman, MD;  Location: [facility] ENDOSCOPY;  Service: Gastroenterology;  Laterality: N/A;   EYE SURGERY     HIP PINNING Left 1956   JOINT REPLACEMENT     REPLACEMENT TOTAL KNEE Left 11/12/2009   TOTAL KNEE REVISION Left 12/07/2016   Procedure: TOTAL KNEE  REVISION;  Surgeon: Dereck Leep, MD;  Location: [facility] ORS;  Service: Orthopedics;  Laterality: Left;    Family History  Problem Relation Age of Onset   Emphysema Father        Smoker   CAD Mother    Stroke Mother    Diabetes Brother    Seizures Brother    Cancer Brother        unknown   Diabetes Brother     Social History   Tobacco Use   Smoking status: Former    Packs/day: 0.25    Years: 1.00    Total pack years: 0.25    Types: Cigarettes    Quit date: 1966    Years since quitting: 57.6   Smokeless tobacco: Never   Tobacco comments:    smoking cessation materials not required  Substance Use Topics   Alcohol use: No    Alcohol/week: 0.0 standard drinks of alcohol     Current Outpatient Medications:    acetaminophen (TYLENOL) 500 MG tablet, Take 1 tablet (500 mg total) by mouth 2 (two) times daily. (Patient taking differently: Take 500 mg by mouth every 6 (six) hours as needed for mild pain.), Disp: 180 tablet, Rfl: 0   amLODipine (NORVASC) 2.5 MG tablet, Take 1 tablet (2.5 mg total) by mouth daily., Disp: 90 tablet, Rfl: 0   aspirin EC 81 MG tablet, Take 81 mg by mouth., Disp: , Rfl:    atorvastatin (LIPITOR) 40 MG tablet, TAKE 1 TABLET(40 MG) BY MOUTH DAILY, Disp: 90 tablet, Rfl: 0   baclofen (LIORESAL) 20 MG tablet, Take 1 tablet (20 mg total) by mouth at bedtime as needed for muscle spasms., Disp: 90 each, Rfl: 0   Calcium-Vitamin D 600-200 MG-UNIT tablet, Take 1 tablet by mouth daily. , Disp: , Rfl:    fluticasone (FLONASE) 50 MCG/ACT nasal spray, Place into the nose., Disp: , Rfl:    gabapentin (NEURONTIN) 300 MG capsule, Take 1-2 capsules (300-600 mg total) by mouth 2 (two) times daily. Take 34m in the morning and 600 mg at bedtime, Disp: 270 capsule, Rfl: 1   losartan (COZAAR) 50 MG tablet, Take 1 tablet (50 mg total) by mouth daily., Disp: 90 tablet, Rfl: 1   pantoprazole (PROTONIX) 40 MG tablet, TAKE 1 TABLET(40 MG) BY MOUTH DAILY, Disp: 90 tablet, Rfl: 1    Blood Glucose Monitoring Suppl (ACCU-CHEK GUIDE) w/Device KIT, See admin instructions. (Patient not taking: Reported on 09/29/2021), Disp: , Rfl:    Blood Pressure Monitor KIT, 1 each by Does not apply route daily. (Patient not taking: Reported on 09/29/2021), Disp: 1 kit, Rfl: 0  Allergies  Allergen Reactions   Lovastatin Other (See Comments)    chest   Pravastatin     Other reaction(s): Muscle Pain chest   Rosuvastatin     Other reaction(s): Muscle Pain chest Other reaction(s): Muscle Pain chest   Statins  Other reaction(s): Muscle Pain chest    I personally reviewed active problem list, medication list, allergies, family history, social history, health maintenance with the patient/caregiver today.   ROS  Constitutional: Negative for fever or weight change.  Respiratory: Negative for cough and shortness of breath.   Cardiovascular: Negative for chest pain or palpitations.  Gastrointestinal: Negative for abdominal pain, no bowel changes.  Musculoskeletal: Negative for gait problem or joint swelling.  Skin: Negative for rash.  Neurological: Negative for dizziness or headache.  No other specific complaints in a complete review of systems (except as listed in HPI above).   Objective  Vitals:   09/29/21 1100  BP: 138/86  Pulse: 78  Resp: 16  SpO2: 98%  Weight: 142 lb (64.4 kg)  Height: 5' 3"  (1.6 m)    Body mass index is 25.15 kg/m.  Physical Exam  Constitutional: Patient appears well-developed and well-nourished.  No distress.  HEENT: head atraumatic, normocephalic, pupils equal and reactive to light, neck supple, throat within normal limits Cardiovascular: Normal rate, regular rhythm and normal heart sounds.  No murmur heard. No BLE edema. Pulmonary/Chest: Effort normal and breath sounds normal. No respiratory distress. Abdominal: Soft.  There is no tenderness. Muscular skeletal: effusion left knee, decrease rom, using a cane  Psychiatric: Patient has a normal  mood and affect. behavior is normal. Judgment and thought content normal.   Recent Results (from the past 2160 hour(s))  POCT HgB A1C     Status: Abnormal   Collection Time: 09/29/21 11:03 AM  Result Value Ref Range   Hemoglobin A1C 6.1 (A) 4.0 - 5.6 %   HbA1c POC (<> result, manual entry)     HbA1c, POC (prediabetic range)     HbA1c, POC (controlled diabetic range)        PHQ2/9:    09/29/2021   11:02 AM 06/30/2021    3:00 PM 05/09/2021   11:09 AM 03/07/2021   10:15 AM 11/04/2020    2:12 PM  Depression screen PHQ 2/9  Decreased Interest 0 0 0 0 0  Down, Depressed, Hopeless 0 0 0 0 0  PHQ - 2 Score 0 0 0 0 0  Altered sleeping 0 0 0 0   Tired, decreased energy 0 0 0 0   Change in appetite 0 0 0 0   Feeling bad or failure about yourself  0 0 0 0   Trouble concentrating 0 0 0 0   Moving slowly or fidgety/restless 0 0 0 0   Suicidal thoughts 0 0 0 0   PHQ-9 Score 0 0 0 0   Difficult doing work/chores  Not difficult at all Not difficult at all Not difficult at all     phq 9 is negative   Fall Risk:    09/29/2021   11:02 AM 06/30/2021    3:00 PM 05/09/2021   11:05 AM 03/07/2021   10:15 AM 11/04/2020    2:12 PM  Fall Risk   Falls in the past year? 0 0 0 0 0  Number falls in past yr: 0 0 0 0 0  Injury with Fall? 0 0 0 0 0  Risk for fall due to : Impaired balance/gait  Impaired balance/gait;Impaired mobility No Fall Risks No Fall Risks  Follow up Falls prevention discussed   Falls prevention discussed Falls prevention discussed      Functional Status Survey: Is the patient deaf or have difficulty hearing?: Yes Does the patient have difficulty seeing, even when wearing glasses/contacts?: No  Does the patient have difficulty concentrating, remembering, or making decisions?: No Does the patient have difficulty walking or climbing stairs?: No Does the patient have difficulty dressing or bathing?: No Does the patient have difficulty doing errands alone such as visiting a  doctor's office or shopping?: No    Assessment & Plan  1. Dyslipidemia associated with type 2 diabetes mellitus (HCC)  - POCT HgB A1C  2. GERD without esophagitis  - pantoprazole (PROTONIX) 40 MG tablet; TAKE 1 TABLET(40 MG) BY MOUTH DAILY  Dispense: 90 tablet; Refill: 1  3. Calcified cerebral meningioma ([place])  Needs to discuss with Dr. Manuella Ghazi   4. History of CVA (cerebrovascular accident) without residual deficits   5. Muscle spasm  - baclofen (LIORESAL) 20 MG tablet; Take 1 tablet (20 mg total) by mouth at bedtime as needed for muscle spasms.  Dispense: 90 each; Refill: 0  6. Peripheral polyneuropathy   7. Mixed hyperlipidemia  - atorvastatin (LIPITOR) 40 MG tablet; TAKE 1 TABLET(40 MG) BY MOUTH DAILY  Dispense: 90 tablet; Refill: 1  8. Essential hypertension  - amLODipine (NORVASC) 2.5 MG tablet; Take 1 tablet (2.5 mg total) by mouth daily.  Dispense: 90 tablet; Refill: 1  9. Vitamin D deficiency   10. B12 deficiency  She is taking it again, three times a week  11. Primary osteoarthritis of both knees    12. Change in bowel movement   She wants to hold off on referral to GI at this time

## 2021-09-29 ENCOUNTER — Ambulatory Visit (INDEPENDENT_AMBULATORY_CARE_PROVIDER_SITE_OTHER): Payer: Medicare Other | Admitting: Family Medicine

## 2021-09-29 ENCOUNTER — Encounter: Payer: Self-pay | Admitting: Family Medicine

## 2021-09-29 VITALS — BP 138/86 | HR 78 | Resp 16 | Ht 63.0 in | Wt 142.0 lb

## 2021-09-29 DIAGNOSIS — E1169 Type 2 diabetes mellitus with other specified complication: Secondary | ICD-10-CM

## 2021-09-29 DIAGNOSIS — D320 Benign neoplasm of cerebral meninges: Secondary | ICD-10-CM | POA: Diagnosis not present

## 2021-09-29 DIAGNOSIS — M62838 Other muscle spasm: Secondary | ICD-10-CM

## 2021-09-29 DIAGNOSIS — E785 Hyperlipidemia, unspecified: Secondary | ICD-10-CM | POA: Diagnosis not present

## 2021-09-29 DIAGNOSIS — E538 Deficiency of other specified B group vitamins: Secondary | ICD-10-CM

## 2021-09-29 DIAGNOSIS — K219 Gastro-esophageal reflux disease without esophagitis: Secondary | ICD-10-CM

## 2021-09-29 DIAGNOSIS — Z8673 Personal history of transient ischemic attack (TIA), and cerebral infarction without residual deficits: Secondary | ICD-10-CM

## 2021-09-29 DIAGNOSIS — I10 Essential (primary) hypertension: Secondary | ICD-10-CM

## 2021-09-29 DIAGNOSIS — M170 Bilateral primary osteoarthritis of knee: Secondary | ICD-10-CM

## 2021-09-29 DIAGNOSIS — E782 Mixed hyperlipidemia: Secondary | ICD-10-CM

## 2021-09-29 DIAGNOSIS — E559 Vitamin D deficiency, unspecified: Secondary | ICD-10-CM

## 2021-09-29 DIAGNOSIS — R198 Other specified symptoms and signs involving the digestive system and abdomen: Secondary | ICD-10-CM

## 2021-09-29 DIAGNOSIS — G629 Polyneuropathy, unspecified: Secondary | ICD-10-CM

## 2021-09-29 LAB — POCT GLYCOSYLATED HEMOGLOBIN (HGB A1C): Hemoglobin A1C: 6.1 % — AB (ref 4.0–5.6)

## 2021-09-29 MED ORDER — BACLOFEN 20 MG PO TABS: 20.0000 mg | ORAL_TABLET | Freq: Every evening | ORAL | 0 refills | Status: DC | PRN

## 2021-09-29 MED ORDER — ATORVASTATIN CALCIUM 40 MG PO TABS: ORAL_TABLET | ORAL | 1 refills | Status: DC

## 2021-09-29 MED ORDER — AMLODIPINE BESYLATE 2.5 MG PO TABS: 2.5000 mg | ORAL_TABLET | Freq: Every day | ORAL | 1 refills | Status: DC

## 2021-09-29 MED ORDER — PANTOPRAZOLE SODIUM 40 MG PO TBEC: DELAYED_RELEASE_TABLET | ORAL | 1 refills | Status: DC

## 2022-01-29 NOTE — Progress Notes (Unsigned)
Name: Catherine Burgess   MRN: [id number]    DOB: 23-Jan-1936   Date:01/30/2022       Progress Note  Subjective  Chief Complaint  Follow Up  HPI  Abnormal MRI brain from 2018 also abnormal CT brain from 06/2017. CT showed calcified meningioma, she denies headaches, dizziness or neuro deficitis.   Last syncope was 2019 . She is seeing Dr. Manuella Ghazi   Neuropathy: seen by neurologist, she takes gabapentin but states down to two capsules at night only,   and tingling and numbness has resolved. .She is back on B12 and we will recheck level    YI:RSWNI well at this time.   GERD: she is taking Pantoprazole once a day, no heartburn or indigestion now. She was seen by dr. Marius Ditch in May and had repeat colonoscopy due to change in bowel movements ( she also has a history of volvulus ) , colonoscopy showed diverticulosis but no other problems She states bowel movements goes from constipation to loose stools but no abdominal pain, blood or mucus on stools.    OA/s/p left knee replacement: she has intermittent pain, taking Tylenol prn now, no effusion or redness. She is no longer taking meloxicam She also have back pain intermittent and muscle relaxer helps    Hyperlipidemia: taking medication, denies myalgia, LDL has been at goal at 41 , continue medication   HTN: currently doing well on losartan 50 mg and norvasc 2.5 mg,  BP is at goal  Denies headaches, dizziness or chest pain  Osteopenia: reviewed last bone density 2020, continue high calcium diet , and take vitamin D 2000 units daily , also needs to continue being physically active. Advised to get bone density done but she is not interested at this time   Diabetes diet controlled:  last A1C was 6.4 % today is down to 6.1 % she denies polyphagia, polydipsia or polyuria.  She takes statins and ARB. We will get copy of eye exam  PAD: failed ABI screen , saw vascular surgeon and was advised to go back in 6 months but not interested in going back at this  time due to cost of visit and testing   Change in bowel movement:she was referred to GI back in 2022 due to change in bowel movements, she was seen by Dr. Marius Ditch and had a colonoscopy that only showed diverticulosis, she continues to have multiple bowel movements first thing in am, seems worse with dairy . It does not wake her up at night, no blood in stools, but she has lost about 5 lbs since August, weight is usually in the low 140's. She had diverticulitis in October and it may have been the cause of weight loss. Explained she cannot loose any more weight , we will check labs today . Sed rate and CRP normal in the past, reviewed recent labs done at [facility]. Discussed checking TSH and other labs  Patient Active Problem List   Diagnosis Date Noted   Diabetes mellitus type 2, diet-controlled ([place]) 05/11/2021   Altered bowel habits    Pre-diabetes 10/02/2018   Abnormal ankle brachial index (ABI) 03/02/2018   Calcified cerebral meningioma ([place]) 10/04/2017   History of CVA (cerebrovascular accident) without residual deficits 10/04/2017   Subacromial impingement of right shoulder 10/04/2017   History of anemia 10/13/2016   Abnormal CT of brain 07/23/2016   Vitamin D deficiency 10/28/2015   Post menopausal syndrome 10/28/2015   Osteopenia after menopause 10/28/2015   Cervical disc disease 10/28/2015  H/O total knee replacement 01/20/2015   Vaginal atrophy 09/26/2014   Osteoarthritis of both knees 07/31/2014   GERD without esophagitis 07/31/2014   HLD (hyperlipidemia) 07/31/2014   Hypertension 07/31/2014    Past Surgical History:  Procedure Laterality Date   CATARACT EXTRACTION, BILATERAL     COLONOSCOPY WITH PROPOFOL N/A 06/20/2020   Procedure: COLONOSCOPY WITH PROPOFOL;  Surgeon: Lin Landsman, MD;  Location: [facility] ENDOSCOPY;  Service: Gastroenterology;  Laterality: N/A;   EYE SURGERY     HIP PINNING Left 1956   JOINT REPLACEMENT     REPLACEMENT TOTAL KNEE Left 11/12/2009   TOTAL KNEE  REVISION Left 12/07/2016   Procedure: TOTAL KNEE REVISION;  Surgeon: Dereck Leep, MD;  Location: [facility] ORS;  Service: Orthopedics;  Laterality: Left;    Family History  Problem Relation Age of Onset   Emphysema Father        Smoker   CAD Mother    Stroke Mother    Diabetes Brother    Seizures Brother    Cancer Brother        unknown   Diabetes Brother     Social History   Tobacco Use   Smoking status: Former    Packs/day: 0.25    Years: 1.00    Total pack years: 0.25    Types: Cigarettes    Quit date: 1966    Years since quitting: 58.0   Smokeless tobacco: Never   Tobacco comments:    smoking cessation materials not required  Substance Use Topics   Alcohol use: No    Alcohol/week: 0.0 standard drinks of alcohol     Current Outpatient Medications:    acetaminophen (TYLENOL) 500 MG tablet, Take 1 tablet (500 mg total) by mouth 2 (two) times daily. (Patient taking differently: Take 500 mg by mouth every 6 (six) hours as needed for mild pain.), Disp: 180 tablet, Rfl: 0   amLODipine (NORVASC) 2.5 MG tablet, Take 1 tablet (2.5 mg total) by mouth daily., Disp: 90 tablet, Rfl: 1   aspirin EC 81 MG tablet, Take 81 mg by mouth., Disp: , Rfl:    atorvastatin (LIPITOR) 40 MG tablet, TAKE 1 TABLET(40 MG) BY MOUTH DAILY, Disp: 90 tablet, Rfl: 1   baclofen (LIORESAL) 20 MG tablet, Take 1 tablet (20 mg total) by mouth at bedtime as needed for muscle spasms., Disp: 90 each, Rfl: 0   Calcium-Vitamin D 600-200 MG-UNIT tablet, Take 1 tablet by mouth daily. , Disp: , Rfl:    fluticasone (FLONASE) 50 MCG/ACT nasal spray, Place into the nose., Disp: , Rfl:    gabapentin (NEURONTIN) 300 MG capsule, Take 1-2 capsules (300-600 mg total) by mouth 2 (two) times daily. Take '300mg'$  in the morning and 600 mg at bedtime, Disp: 270 capsule, Rfl: 1   losartan (COZAAR) 50 MG tablet, Take 1 tablet (50 mg total) by mouth daily., Disp: 90 tablet, Rfl: 1   pantoprazole (PROTONIX) 40 MG tablet, TAKE 1  TABLET(40 MG) BY MOUTH DAILY, Disp: 90 tablet, Rfl: 1  Allergies  Allergen Reactions   Lovastatin Other (See Comments)    chest   Pravastatin     Other reaction(s): Muscle Pain chest   Rosuvastatin     Other reaction(s): Muscle Pain chest Other reaction(s): Muscle Pain chest   Statins     Other reaction(s): Muscle Pain chest    I personally reviewed active problem list, medication list, allergies, family history, social history, health maintenance with the patient/caregiver today.   ROS  Ten systems reviewed and is negative except as mentioned in HPI   Objective  Vitals:   01/30/22 1049  BP: 138/72  Pulse: 92  Resp: 16  SpO2: 98%  Weight: 137 lb (62.1 kg)  Height: '5\' 3"'$  (1.6 m)    Body mass index is 24.27 kg/m.  Physical Exam  Constitutional: Patient appears well-developed and well-nourished.  No distress.  HEENT: head atraumatic, normocephalic, pupils equal and reactive to light, neck supple Cardiovascular: Normal rate, regular rhythm and normal heart sounds.  No murmur heard. No BLE edema. Pulmonary/Chest: Effort normal and breath sounds normal. No respiratory distress. Abdominal: Soft.  There is no tenderness. Psychiatric: Patient has a normal mood and affect. behavior is normal. Judgment and thought content normal.   Recent Results (from the past 2160 hour(s))  POCT HgB A1C     Status: Abnormal   Collection Time: 01/30/22 10:49 AM  Result Value Ref Range   Hemoglobin A1C 6.3 (A) 4.0 - 5.6 %   HbA1c POC (<> result, manual entry)     HbA1c, POC (prediabetic range)     HbA1c, POC (controlled diabetic range)       PHQ2/9:    01/30/2022   10:50 AM 09/29/2021   11:02 AM 06/30/2021    3:00 PM 05/09/2021   11:09 AM 03/07/2021   10:15 AM  Depression screen PHQ 2/9  Decreased Interest 0 0 0 0 0  Down, Depressed, Hopeless 0 0 0 0 0  PHQ - 2 Score 0 0 0 0 0  Altered sleeping 0 0 0 0 0  Tired, decreased energy 0 0 0 0 0  Change in appetite 0 0 0 0 0   Feeling bad or failure about yourself  0 0 0 0 0  Trouble concentrating 0 0 0 0 0  Moving slowly or fidgety/restless 0 0 0 0 0  Suicidal thoughts 0 0 0 0 0  PHQ-9 Score 0 0 0 0 0  Difficult doing work/chores   Not difficult at all Not difficult at all Not difficult at all    phq 9 is negative   Fall Risk:    01/30/2022   10:49 AM 09/29/2021   11:02 AM 06/30/2021    3:00 PM 05/09/2021   11:05 AM 03/07/2021   10:15 AM  Fall Risk   Falls in the past year? 0 0 0 0 0  Number falls in past yr: 0 0 0 0 0  Injury with Fall? 0 0 0 0 0  Risk for fall due to : Impaired balance/gait Impaired balance/gait  Impaired balance/gait;Impaired mobility No Fall Risks  Follow up Falls prevention discussed Falls prevention discussed   Falls prevention discussed      Functional Status Survey: Is the patient deaf or have difficulty hearing?: Yes Does the patient have difficulty seeing, even when wearing glasses/contacts?: No Does the patient have difficulty concentrating, remembering, or making decisions?: No Does the patient have difficulty walking or climbing stairs?: Yes Does the patient have difficulty dressing or bathing?: No Does the patient have difficulty doing errands alone such as visiting a doctor's office or shopping?: No    Assessment & Plan  1. Dyslipidemia associated with type 2 diabetes mellitus ([place])  - POCT HgB A1C - Lipid panel  2. Calcified cerebral meningioma ([place])  Under the care of neurologist   3. PAD (peripheral artery disease) ([place])  Discussed physical activity, on statin therapy, refuses to go back to vascular surgeon   4. Change in bowel  movement   5. Peripheral polyneuropathy  - gabapentin (NEURONTIN) 300 MG capsule; Take 2 capsules (600 mg total) by mouth at bedtime. Take '300mg'$  in the morning and 600 mg at bedtime  Dispense: 180 capsule; Refill: 1  6. GERD without esophagitis   7. History of CVA (cerebrovascular accident) without residual  deficits  Continue medications  8. Need for immunization against influenza  - Flu Vaccine QUAD High Dose([facility])  9. Mixed hyperlipidemia   1. B12 deficiency  - Vitamin B12  11. Osteoarthritis of both knees, unspecified osteoarthritis type  - gabapentin (NEURONTIN) 300 MG capsule; Take 2 capsules (600 mg total) by mouth at bedtime. Take '300mg'$  in the morning and 600 mg at bedtime  Dispense: 180 capsule; Refill: 1  12. Osteopenia of multiple sites  Refuses having repeat bone density at this time   13. Vitamin D deficiency  - VITAMIN D 25 Hydroxy (Vit-D Deficiency, Fractures)  14. Primary osteoarthritis of both knees   15. Weight loss  - COMPLETE METABOLIC PANEL WITH GFR - CBC with Differential/Platelet - TSH  16. Neck pain   17. Cervical disc disease  - gabapentin (NEURONTIN) 300 MG capsule; Take 2 capsules (600 mg total) by mouth at bedtime. Take '300mg'$  in the morning and 600 mg at bedtime  Dispense: 180 capsule; Refill: 1  18. Disorder of common peroneal nerve  - gabapentin (NEURONTIN) 300 MG capsule; Take 2 capsules (600 mg total) by mouth at bedtime. Take '300mg'$  in the morning and 600 mg at bedtime  Dispense: 180 capsule; Refill: 1

## 2022-01-30 ENCOUNTER — Encounter: Payer: Self-pay | Admitting: Family Medicine

## 2022-01-30 ENCOUNTER — Ambulatory Visit (INDEPENDENT_AMBULATORY_CARE_PROVIDER_SITE_OTHER): Payer: Medicare Other | Admitting: Family Medicine

## 2022-01-30 VITALS — BP 138/72 | HR 92 | Resp 16 | Ht 63.0 in | Wt 137.0 lb

## 2022-01-30 DIAGNOSIS — D320 Benign neoplasm of cerebral meninges: Secondary | ICD-10-CM

## 2022-01-30 DIAGNOSIS — R634 Abnormal weight loss: Secondary | ICD-10-CM

## 2022-01-30 DIAGNOSIS — I739 Peripheral vascular disease, unspecified: Secondary | ICD-10-CM

## 2022-01-30 DIAGNOSIS — M542 Cervicalgia: Secondary | ICD-10-CM

## 2022-01-30 DIAGNOSIS — E785 Hyperlipidemia, unspecified: Secondary | ICD-10-CM | POA: Diagnosis not present

## 2022-01-30 DIAGNOSIS — G629 Polyneuropathy, unspecified: Secondary | ICD-10-CM

## 2022-01-30 DIAGNOSIS — K219 Gastro-esophageal reflux disease without esophagitis: Secondary | ICD-10-CM

## 2022-01-30 DIAGNOSIS — E559 Vitamin D deficiency, unspecified: Secondary | ICD-10-CM

## 2022-01-30 DIAGNOSIS — M170 Bilateral primary osteoarthritis of knee: Secondary | ICD-10-CM

## 2022-01-30 DIAGNOSIS — Z8673 Personal history of transient ischemic attack (TIA), and cerebral infarction without residual deficits: Secondary | ICD-10-CM

## 2022-01-30 DIAGNOSIS — R198 Other specified symptoms and signs involving the digestive system and abdomen: Secondary | ICD-10-CM | POA: Diagnosis not present

## 2022-01-30 DIAGNOSIS — Z23 Encounter for immunization: Secondary | ICD-10-CM | POA: Diagnosis not present

## 2022-01-30 DIAGNOSIS — G5730 Lesion of lateral popliteal nerve, unspecified lower limb: Secondary | ICD-10-CM

## 2022-01-30 DIAGNOSIS — M5090 Cervical disc disorder, unspecified, unspecified cervical region: Secondary | ICD-10-CM

## 2022-01-30 DIAGNOSIS — M8589 Other specified disorders of bone density and structure, multiple sites: Secondary | ICD-10-CM

## 2022-01-30 DIAGNOSIS — E1169 Type 2 diabetes mellitus with other specified complication: Secondary | ICD-10-CM | POA: Diagnosis not present

## 2022-01-30 DIAGNOSIS — E782 Mixed hyperlipidemia: Secondary | ICD-10-CM

## 2022-01-30 DIAGNOSIS — E538 Deficiency of other specified B group vitamins: Secondary | ICD-10-CM

## 2022-01-30 LAB — POCT GLYCOSYLATED HEMOGLOBIN (HGB A1C): Hemoglobin A1C: 6.3 % — AB (ref 4.0–5.6)

## 2022-01-30 MED ORDER — GABAPENTIN 300 MG PO CAPS: 600.0000 mg | ORAL_CAPSULE | Freq: Every day | ORAL | 1 refills | Status: DC

## 2022-01-30 NOTE — Patient Instructions (Signed)
Ask your pharmacist to give you : Tdap, Shingles vaccine and RSV

## 2022-01-31 LAB — COMPLETE METABOLIC PANEL WITH GFR
AG Ratio: 1.9 (calc) (ref 1.0–2.5)
ALT: 14 U/L (ref 6–29)
AST: 18 U/L (ref 10–35)
Albumin: 3.9 g/dL (ref 3.6–5.1)
Alkaline phosphatase (APISO): 123 U/L (ref 37–153)
BUN: 17 mg/dL (ref 7–25)
CO2: 30 mmol/L (ref 20–32)
Calcium: 9.2 mg/dL (ref 8.6–10.4)
Chloride: 107 mmol/L (ref 98–110)
Creat: 0.78 mg/dL (ref 0.60–0.95)
Globulin: 2.1 g/dL (calc) (ref 1.9–3.7)
Glucose, Bld: 92 mg/dL (ref 65–99)
Potassium: 3.9 mmol/L (ref 3.5–5.3)
Sodium: 145 mmol/L (ref 135–146)
Total Bilirubin: 0.7 mg/dL (ref 0.2–1.2)
Total Protein: 6 g/dL — ABNORMAL LOW (ref 6.1–8.1)
eGFR: 75 mL/min/{1.73_m2} (ref >=60)

## 2022-01-31 LAB — LIPID PANEL
Cholesterol: 113 mg/dL (ref <200)
HDL: 48 mg/dL — ABNORMAL LOW (ref >=50)
LDL Cholesterol (Calc): 51 mg/dL (calc)
Non-HDL Cholesterol (Calc): 65 mg/dL (calc) (ref <130)
Total CHOL/HDL Ratio: 2.4 (calc) (ref <5.0)
Triglycerides: 60 mg/dL (ref <150)

## 2022-01-31 LAB — CBC WITH DIFFERENTIAL/PLATELET
Absolute Monocytes: 439 cells/uL (ref 200–950)
Basophils Absolute: 40 cells/uL (ref 0–200)
Basophils Relative: 0.7 %
Eosinophils Absolute: 80 cells/uL (ref 15–500)
Eosinophils Relative: 1.4 %
HCT: 43.7 % (ref 35.0–45.0)
Hemoglobin: 14.1 g/dL (ref 11.7–15.5)
Lymphs Abs: 1345 cells/uL (ref 850–3900)
MCH: 27.5 pg (ref 27.0–33.0)
MCHC: 32.3 g/dL (ref 32.0–36.0)
MCV: 85.4 fL (ref 80.0–100.0)
MPV: 10.3 fL (ref 7.5–12.5)
Monocytes Relative: 7.7 %
Neutro Abs: 3796 cells/uL (ref 1500–7800)
Neutrophils Relative %: 66.6 %
Platelets: 297 10*3/uL (ref 140–400)
RBC: 5.12 10*6/uL — ABNORMAL HIGH (ref 3.80–5.10)
RDW: 13.2 % (ref 11.0–15.0)
Total Lymphocyte: 23.6 %
WBC: 5.7 10*3/uL (ref 3.8–10.8)

## 2022-01-31 LAB — VITAMIN D 25 HYDROXY (VIT D DEFICIENCY, FRACTURES): Vit D, 25-Hydroxy: 32 ng/mL (ref 30–100)

## 2022-01-31 LAB — VITAMIN B12: Vitamin B-12: 716 pg/mL (ref 200–1100)

## 2022-01-31 LAB — TSH: TSH: 0.79 mIU/L (ref 0.40–4.50)

## 2022-03-03 DIAGNOSIS — J40 Bronchitis, not specified as acute or chronic: Secondary | ICD-10-CM | POA: Diagnosis not present

## 2022-03-03 DIAGNOSIS — R509 Fever, unspecified: Secondary | ICD-10-CM | POA: Diagnosis not present

## 2022-03-03 DIAGNOSIS — R109 Unspecified abdominal pain: Secondary | ICD-10-CM | POA: Diagnosis not present

## 2022-03-03 DIAGNOSIS — J0110 Acute frontal sinusitis, unspecified: Secondary | ICD-10-CM | POA: Diagnosis not present

## 2022-03-03 DIAGNOSIS — R051 Acute cough: Secondary | ICD-10-CM | POA: Diagnosis not present

## 2022-03-03 DIAGNOSIS — J029 Acute pharyngitis, unspecified: Secondary | ICD-10-CM | POA: Diagnosis not present

## 2022-03-03 DIAGNOSIS — R1032 Left lower quadrant pain: Secondary | ICD-10-CM | POA: Diagnosis not present

## 2022-03-03 DIAGNOSIS — R059 Cough, unspecified: Secondary | ICD-10-CM | POA: Diagnosis not present

## 2022-03-03 DIAGNOSIS — Z03818 Encounter for observation for suspected exposure to other biological agents ruled out: Secondary | ICD-10-CM | POA: Diagnosis not present

## 2022-03-03 DIAGNOSIS — R140 Abdominal distension (gaseous): Secondary | ICD-10-CM | POA: Diagnosis not present

## 2022-04-09 ENCOUNTER — Other Ambulatory Visit: Payer: Self-pay | Admitting: Family Medicine

## 2022-04-09 DIAGNOSIS — K219 Gastro-esophageal reflux disease without esophagitis: Secondary | ICD-10-CM

## 2022-04-17 ENCOUNTER — Other Ambulatory Visit: Payer: Self-pay | Admitting: Family Medicine

## 2022-04-17 DIAGNOSIS — E782 Mixed hyperlipidemia: Secondary | ICD-10-CM

## 2022-05-09 ENCOUNTER — Other Ambulatory Visit: Payer: Self-pay | Admitting: Family Medicine

## 2022-05-09 DIAGNOSIS — K219 Gastro-esophageal reflux disease without esophagitis: Secondary | ICD-10-CM

## 2022-05-11 ENCOUNTER — Other Ambulatory Visit: Payer: Self-pay | Admitting: Family Medicine

## 2022-05-11 DIAGNOSIS — K219 Gastro-esophageal reflux disease without esophagitis: Secondary | ICD-10-CM

## 2022-05-12 DIAGNOSIS — I739 Peripheral vascular disease, unspecified: Secondary | ICD-10-CM | POA: Diagnosis not present

## 2022-05-12 DIAGNOSIS — E1169 Type 2 diabetes mellitus with other specified complication: Secondary | ICD-10-CM | POA: Diagnosis not present

## 2022-05-12 DIAGNOSIS — E785 Hyperlipidemia, unspecified: Secondary | ICD-10-CM | POA: Diagnosis not present

## 2022-05-12 DIAGNOSIS — Z96652 Presence of left artificial knee joint: Secondary | ICD-10-CM | POA: Diagnosis not present

## 2022-05-18 DIAGNOSIS — E782 Mixed hyperlipidemia: Secondary | ICD-10-CM | POA: Diagnosis not present

## 2022-05-18 DIAGNOSIS — G4733 Obstructive sleep apnea (adult) (pediatric): Secondary | ICD-10-CM | POA: Diagnosis not present

## 2022-05-18 DIAGNOSIS — Z8673 Personal history of transient ischemic attack (TIA), and cerebral infarction without residual deficits: Secondary | ICD-10-CM | POA: Diagnosis not present

## 2022-05-18 DIAGNOSIS — E119 Type 2 diabetes mellitus without complications: Secondary | ICD-10-CM | POA: Diagnosis not present

## 2022-05-18 DIAGNOSIS — M1990 Unspecified osteoarthritis, unspecified site: Secondary | ICD-10-CM | POA: Diagnosis not present

## 2022-05-18 DIAGNOSIS — R600 Localized edema: Secondary | ICD-10-CM | POA: Diagnosis not present

## 2022-05-18 DIAGNOSIS — I10 Essential (primary) hypertension: Secondary | ICD-10-CM | POA: Diagnosis not present

## 2022-05-18 DIAGNOSIS — I498 Other specified cardiac arrhythmias: Secondary | ICD-10-CM | POA: Diagnosis not present

## 2022-05-18 DIAGNOSIS — K219 Gastro-esophageal reflux disease without esophagitis: Secondary | ICD-10-CM | POA: Diagnosis not present

## 2022-05-29 NOTE — Progress Notes (Unsigned)
Name: Catherine Burgess   MRN: [id number]    DOB: September 03, 1935   Date:06/01/2022       Progress Note  Subjective  Chief Complaint  Follow Up  HPI  Abnormal MRI brain from 2018 also abnormal CT brain from 06/2017. CT showed calcified meningioma, she denies headaches, dizziness or neuro deficitis.   Last syncope was 2019, last CT brain and c-spine done 11/2019 stable findings  . She is under the care of Dr. Sherryll Burger   Neuropathy: seen by neurologist, she takes gabapentin but states down to two capsules at night only,   and tingling and numbness has resolved. .She is back on B12 only taking three times a week and last level was at goal    [id number] well at this time. Stable   GERD: she is taking Pantoprazole once a day, no heartburn or indigestion now. She was seen by dr. Allegra Lai in May and had repeat colonoscopy due to change in bowel movements ( she also has a history of volvulus ) , colonoscopy showed diverticulosis but no other problems She states bowel movements goes from constipation to loose stools but controlled with a high fiber diet. She has intermittent LUQ pain, recent amylase and lipase negative. Symptoms are mild and intermittent Advised to monitor to see if pain is more frequent when she has constipation    OA/s/p left knee replacement: she has intermittent pain, taking Tylenol prn now, no effusion or redness. She is no longer taking meloxicam She also have back pain intermittent and muscle relaxer also prn     Hyperlipidemia: taking medication, denies myalgia, LDL has been at goal at 51 , continue medication   HTN: currently doing well on losartan 50 mg and norvasc 2.5 mg,  BP is at goal  Denies headaches, dizziness or chest pain. She needs refills today   Osteopenia: reviewed last bone density 2020, continue high calcium diet , and take vitamin D 2000 units daily , also needs to continue being physically active. Advised to get bone density done but she is not interested at this time ,  she states she would not take medication for it   Diabetes diet controlled with dyslipidemia : today A1C is 6 %, she states eating very healthy, she denies polyphagia, polydipsia or polyuria.  She takes statins and ARB.  PAD: failed ABI screen , saw vascular surgeon and was advised to go back in 6 months but not interested in going back at this time due to cost of visit and testing Unchanged. She does not walk very far    Patient Active Problem List   Diagnosis Date Noted   B12 deficiency 06/01/2022   Peripheral polyneuropathy 06/01/2022   Dyslipidemia associated with type 2 diabetes mellitus 06/01/2022   PAD (peripheral artery disease) 01/30/2022   Diabetes mellitus type 2, diet-controlled 05/11/2021   Altered bowel habits    Abnormal ankle brachial index (ABI) 03/02/2018   Calcified cerebral meningioma 10/04/2017   History of CVA (cerebrovascular accident) without residual deficits 10/04/2017   Subacromial impingement of right shoulder 10/04/2017   History of anemia 10/13/2016   Abnormal CT of brain 07/23/2016   Vitamin D deficiency 10/28/2015   Post menopausal syndrome 10/28/2015   Osteopenia of multiple sites 10/28/2015   Cervical disc disease 10/28/2015   H/O total knee replacement 01/20/2015   Vaginal atrophy 09/26/2014   Osteoarthritis of both knees 07/31/2014   GERD without esophagitis 07/31/2014   HLD (hyperlipidemia) 07/31/2014   Hypertension 07/31/2014  Past Surgical History:  Procedure Laterality Date   CATARACT EXTRACTION, BILATERAL     COLONOSCOPY WITH PROPOFOL N/A 06/20/2020   Procedure: COLONOSCOPY WITH PROPOFOL;  Surgeon: Toney Reil, MD;  Location: [facility] ENDOSCOPY;  Service: Gastroenterology;  Laterality: N/A;   EYE SURGERY     HIP PINNING Left 1956   JOINT REPLACEMENT     REPLACEMENT TOTAL KNEE Left 11/12/2009   TOTAL KNEE REVISION Left 12/07/2016   Procedure: TOTAL KNEE REVISION;  Surgeon: Donato Heinz, MD;  Location: [facility] ORS;  Service:  Orthopedics;  Laterality: Left;    Family History  Problem Relation Age of Onset   Emphysema Father        Smoker   CAD Mother    Stroke Mother    Diabetes Brother    Seizures Brother    Cancer Brother        unknown   Diabetes Brother     Social History   Tobacco Use   Smoking status: Former    Packs/day: 0.25    Years: 1.00    Additional pack years: 0.00    Total pack years: 0.25    Types: Cigarettes    Quit date: 1966    Years since quitting: 58.3   Smokeless tobacco: Never   Tobacco comments:    smoking cessation materials not required  Substance Use Topics   Alcohol use: No    Alcohol/week: 0.0 standard drinks of alcohol     Current Outpatient Medications:    acetaminophen (TYLENOL) 500 MG tablet, Take 1 tablet (500 mg total) by mouth 2 (two) times daily. (Patient taking differently: Take 500 mg by mouth every 6 (six) hours as needed for mild pain.), Disp: 180 tablet, Rfl: 0   aspirin EC 81 MG tablet, Take 81 mg by mouth., Disp: , Rfl:    atorvastatin (LIPITOR) 40 MG tablet, TAKE 1 TABLET(40 MG) BY MOUTH DAILY, Disp: 90 tablet, Rfl: 1   baclofen (LIORESAL) 20 MG tablet, Take 1 tablet (20 mg total) by mouth at bedtime as needed for muscle spasms., Disp: 90 each, Rfl: 0   Calcium-Vitamin D 600-200 MG-UNIT tablet, Take 1 tablet by mouth daily. , Disp: , Rfl:    fluticasone (FLONASE) 50 MCG/ACT nasal spray, Place into the nose., Disp: , Rfl:    amLODipine (NORVASC) 2.5 MG tablet, Take 1 tablet (2.5 mg total) by mouth daily., Disp: 90 tablet, Rfl: 1   gabapentin (NEURONTIN) 300 MG capsule, Take 2 capsules (600 mg total) by mouth at bedtime., Disp: 180 capsule, Rfl: 1   losartan (COZAAR) 50 MG tablet, Take 1 tablet (50 mg total) by mouth daily., Disp: 90 tablet, Rfl: 1   pantoprazole (PROTONIX) 40 MG tablet, Take 1 tablet (40 mg total) by mouth daily., Disp: 90 tablet, Rfl: 1  Allergies  Allergen Reactions   Lovastatin Other (See Comments)    chest   Pravastatin      Other reaction(s): Muscle Pain chest   Rosuvastatin     Other reaction(s): Muscle Pain chest Other reaction(s): Muscle Pain chest   Statins     Other reaction(s): Muscle Pain chest    I personally reviewed active problem list, medication list, allergies, family history, social history, health maintenance with the patient/caregiver today.   ROS  Ten systems reviewed and is negative except as mentioned in HPI   Objective  Vitals:   06/01/22 1046  BP: 128/70  Pulse: 71  Resp: 16  Temp: 97.6 F (36.4 C)  TempSrc: Oral  SpO2: 98%  Weight: 134 lb 1.6 oz (60.8 kg)  Height: 5\' 2"  (1.575 m)    Body mass index is 24.53 kg/m.  Physical Exam  Constitutional: Patient appears well-developed and well-nourished. Obese  No distress.  HEENT: head atraumatic, normocephalic, pupils equal and reactive to light,, neck supple Cardiovascular: Normal rate, regular rhythm and normal heart sounds.  No murmur heard. No BLE edema. Pulmonary/Chest: Effort normal and breath sounds normal. No respiratory distress. Abdominal: Soft.  There is no tenderness. Psychiatric: Patient has a normal mood and affect. behavior is normal. Judgment and thought content normal.   Recent Results (from the past 2160 hour(s))  POCT HgB A1C     Status: Abnormal   Collection Time: 06/01/22 10:49 AM  Result Value Ref Range   Hemoglobin A1C 6.0 (A) 4.0 - 5.6 %   HbA1c POC (<> result, manual entry)     HbA1c, POC (prediabetic range)     HbA1c, POC (controlled diabetic range)      Diabetic Foot Exam: Diabetic Foot Exam - Simple   Simple Foot Form Visual Inspection No deformities, no ulcerations, no other skin breakdown bilaterally: Yes Sensation Testing Intact to touch and monofilament testing bilaterally: Yes Pulse Check Posterior Tibialis and Dorsalis pulse intact bilaterally: Yes Comments      PHQ2/9:    06/01/2022   10:48 AM 01/30/2022   10:50 AM 09/29/2021   11:02 AM 06/30/2021    3:00 PM  05/09/2021   11:09 AM  Depression screen PHQ 2/9  Decreased Interest 0 0 0 0 0  Down, Depressed, Hopeless 0 0 0 0 0  PHQ - 2 Score 0 0 0 0 0  Altered sleeping 0 0 0 0 0  Tired, decreased energy 0 0 0 0 0  Change in appetite 0 2 0 0 0  Feeling bad or failure about yourself  0 0 0 0 0  Trouble concentrating 0 0 0 0 0  Moving slowly or fidgety/restless 0 0 0 0 0  Suicidal thoughts 0 0 0 0 0  PHQ-9 Score 0 2 0 0 0  Difficult doing work/chores    Not difficult at all Not difficult at all    phq 9 is negative    Assessment & Plan  1. Dyslipidemia associated with type 2 diabetes mellitus  - POCT HgB A1C - Urine Microalbumin w/creat. ratio - HM Diabetes Foot Exam  2. PAD (peripheral artery disease)  Refuses going back due to cost   3. Calcified cerebral meningioma  Under the care of Dr. Sherryll Burger  4. History of CVA (cerebrovascular accident) without residual deficits  On statin therapy   5. GERD without esophagitis  - pantoprazole (PROTONIX) 40 MG tablet; Take 1 tablet (40 mg total) by mouth daily.  Dispense: 90 tablet; Refill: 1  6. Peripheral polyneuropathy  - gabapentin (NEURONTIN) 300 MG capsule; Take 2 capsules (600 mg total) by mouth at bedtime.  Dispense: 180 capsule; Refill: 1  7. B12 deficiency  On B12 supplementation  8. Vitamin D deficiency  Continue vitamin D   9. Primary osteoarthritis of both knees  Stable  10. Osteopenia of multiple sites  Refuses therapy or rechecking labs   11. Essential hypertension  - losartan (COZAAR) 50 MG tablet; Take 1 tablet (50 mg total) by mouth daily.  Dispense: 90 tablet; Refill: 1 - amLODipine (NORVASC) 2.5 MG tablet; Take 1 tablet (2.5 mg total) by mouth daily.  Dispense: 90 tablet; Refill: 1  12. Cervical disc disease  -  gabapentin (NEURONTIN) 300 MG capsule; Take 2 capsules (600 mg total) by mouth at bedtime.  Dispense: 180 capsule; Refill: 1

## 2022-06-01 ENCOUNTER — Ambulatory Visit (INDEPENDENT_AMBULATORY_CARE_PROVIDER_SITE_OTHER): Payer: Medicare Other | Admitting: Family Medicine

## 2022-06-01 ENCOUNTER — Encounter: Payer: Self-pay | Admitting: Family Medicine

## 2022-06-01 VITALS — BP 128/70 | HR 71 | Temp 97.6°F | Resp 16 | Ht 62.0 in | Wt 134.1 lb

## 2022-06-01 DIAGNOSIS — E559 Vitamin D deficiency, unspecified: Secondary | ICD-10-CM | POA: Diagnosis not present

## 2022-06-01 DIAGNOSIS — E785 Hyperlipidemia, unspecified: Secondary | ICD-10-CM | POA: Insufficient documentation

## 2022-06-01 DIAGNOSIS — K219 Gastro-esophageal reflux disease without esophagitis: Secondary | ICD-10-CM

## 2022-06-01 DIAGNOSIS — M8589 Other specified disorders of bone density and structure, multiple sites: Secondary | ICD-10-CM

## 2022-06-01 DIAGNOSIS — I739 Peripheral vascular disease, unspecified: Secondary | ICD-10-CM | POA: Diagnosis not present

## 2022-06-01 DIAGNOSIS — Z8673 Personal history of transient ischemic attack (TIA), and cerebral infarction without residual deficits: Secondary | ICD-10-CM | POA: Diagnosis not present

## 2022-06-01 DIAGNOSIS — M170 Bilateral primary osteoarthritis of knee: Secondary | ICD-10-CM

## 2022-06-01 DIAGNOSIS — E538 Deficiency of other specified B group vitamins: Secondary | ICD-10-CM

## 2022-06-01 DIAGNOSIS — I10 Essential (primary) hypertension: Secondary | ICD-10-CM

## 2022-06-01 DIAGNOSIS — D320 Benign neoplasm of cerebral meninges: Secondary | ICD-10-CM

## 2022-06-01 DIAGNOSIS — E1169 Type 2 diabetes mellitus with other specified complication: Secondary | ICD-10-CM

## 2022-06-01 DIAGNOSIS — G629 Polyneuropathy, unspecified: Secondary | ICD-10-CM

## 2022-06-01 DIAGNOSIS — M5090 Cervical disc disorder, unspecified, unspecified cervical region: Secondary | ICD-10-CM

## 2022-06-01 LAB — POCT GLYCOSYLATED HEMOGLOBIN (HGB A1C): Hemoglobin A1C: 6 % — AB (ref 4.0–5.6)

## 2022-06-01 MED ORDER — GABAPENTIN 300 MG PO CAPS
600.0000 mg | ORAL_CAPSULE | Freq: Every day | ORAL | 1 refills | Status: DC
Start: 2022-06-01 — End: 2022-11-18

## 2022-06-01 MED ORDER — AMLODIPINE BESYLATE 2.5 MG PO TABS: 2.5000 mg | ORAL_TABLET | Freq: Every day | ORAL | 1 refills | Status: DC

## 2022-06-01 MED ORDER — PANTOPRAZOLE SODIUM 40 MG PO TBEC: 40.0000 mg | DELAYED_RELEASE_TABLET | Freq: Every day | ORAL | 1 refills | Status: DC

## 2022-06-01 MED ORDER — LOSARTAN POTASSIUM 50 MG PO TABS: 50.0000 mg | ORAL_TABLET | Freq: Every day | ORAL | 1 refills | Status: DC

## 2022-06-02 LAB — MICROALBUMIN / CREATININE URINE RATIO
Creatinine, Urine: 12 mg/dL — ABNORMAL LOW (ref 20–275)
Microalb, Ur: <0.2 mg/dL

## 2022-06-08 DIAGNOSIS — R5383 Other fatigue: Secondary | ICD-10-CM | POA: Diagnosis not present

## 2022-06-08 DIAGNOSIS — R42 Dizziness and giddiness: Secondary | ICD-10-CM | POA: Diagnosis not present

## 2022-06-08 DIAGNOSIS — R197 Diarrhea, unspecified: Secondary | ICD-10-CM | POA: Diagnosis not present

## 2022-07-17 DIAGNOSIS — H26491 Other secondary cataract, right eye: Secondary | ICD-10-CM | POA: Diagnosis not present

## 2022-07-17 DIAGNOSIS — Z961 Presence of intraocular lens: Secondary | ICD-10-CM | POA: Diagnosis not present

## 2022-07-17 LAB — HM DIABETES EYE EXAM

## 2022-09-04 ENCOUNTER — Ambulatory Visit (INDEPENDENT_AMBULATORY_CARE_PROVIDER_SITE_OTHER): Payer: Medicare Other

## 2022-09-04 VITALS — Ht 63.0 in | Wt 134.0 lb

## 2022-09-04 DIAGNOSIS — Z0000 Encounter for general adult medical examination without abnormal findings: Secondary | ICD-10-CM | POA: Diagnosis not present

## 2022-09-04 NOTE — Progress Notes (Addendum)
Subjective:   Catherine Burgess is a 87 y.o. female who presents for Medicare Annual (Subsequent) preventive examination.  Visit Complete: Virtual  I connected with  [name] on 09/04/22 by a audio enabled telemedicine application and verified that I am speaking with the correct person using two identifiers.  Patient Location: Home  Provider Location: Home Office  I discussed the limitations of evaluation and management by telemedicine. The patient expressed understanding and agreed to proceed.  Vital Signs: Unable to obtain new vitals due to this being a telehealth visit.  Patient Medicare AWV questionnaire was completed by the patient on (not done); I have confirmed that all information answered by patient is correct and no changes since this date.  Review of Systems    Cardiac Risk Factors include: advanced age (>43men, >72 women);diabetes mellitus;dyslipidemia;hypertension    Objective:    Today's Vitals   09/04/22 0820  Weight: 134 lb (60.8 kg)  Height: 5\' 3"  (1.6 m)   Body mass index is 23.74 kg/m.     09/04/2022    8:36 AM 06/20/2020    9:32 AM 12/04/2019    8:28 AM 10/17/2019   10:56 AM 10/11/2018   10:52 AM 07/18/2018    9:31 AM 10/24/2017    3:31 PM  Advanced Directives  Does Patient Have a Medical Advance Directive? Yes Yes No Yes Yes Yes Yes  Type of Estate agent of [place];Living will Healthcare Power of [place];Living will  Healthcare Power of [place];Living will Healthcare Power of [place];Living will Living will   Does patient want to make changes to medical advance directive?      No - Patient declined   Copy of Healthcare Power of Attorney in Chart?    No - copy requested No - copy requested    Would patient like information on creating a medical advance directive?   No - Patient declined        Current Medications (verified) Outpatient Encounter Medications as of 09/04/2022  Medication Sig   acetaminophen (TYLENOL) 500  MG tablet Take 1 tablet (500 mg total) by mouth 2 (two) times daily. (Patient taking differently: Take 500 mg by mouth every 6 (six) hours as needed for mild pain.)   amLODipine (NORVASC) 2.5 MG tablet Take 1 tablet (2.5 mg total) by mouth daily.   aspirin EC 81 MG tablet Take 81 mg by mouth.   atorvastatin (LIPITOR) 40 MG tablet TAKE 1 TABLET(40 MG) BY MOUTH DAILY   baclofen (LIORESAL) 20 MG tablet Take 1 tablet (20 mg total) by mouth at bedtime as needed for muscle spasms.   Calcium-Vitamin D 600-200 MG-UNIT tablet Take 1 tablet by mouth daily.    fluticasone (FLONASE) 50 MCG/ACT nasal spray Place into the nose.   gabapentin (NEURONTIN) 300 MG capsule Take 2 capsules (600 mg total) by mouth at bedtime.   losartan (COZAAR) 50 MG tablet Take 1 tablet (50 mg total) by mouth daily.   pantoprazole (PROTONIX) 40 MG tablet Take 1 tablet (40 mg total) by mouth daily.   No facility-administered encounter medications on file as of 09/04/2022.    Allergies (verified) Lovastatin, Pravastatin, Rosuvastatin, and Statins   History: Past Medical History:  Diagnosis Date   Degenerative joint disease    GERD (gastroesophageal reflux disease)    Hyperlipidemia    Hypertension    Malignant essential hypertension 07/23/2016   Osteoarthrosis    Reflux esophagitis    Past Surgical History:  Procedure Laterality Date   CATARACT  EXTRACTION, BILATERAL     COLONOSCOPY WITH PROPOFOL N/A 06/20/2020   Procedure: COLONOSCOPY WITH PROPOFOL;  Surgeon: Toney Reil, MD;  Location: [facility] ENDOSCOPY;  Service: Gastroenterology;  Laterality: N/A;   EYE SURGERY     HIP PINNING Left 1956   JOINT REPLACEMENT     REPLACEMENT TOTAL KNEE Left 11/12/2009   TOTAL KNEE REVISION Left 12/07/2016   Procedure: TOTAL KNEE REVISION;  Surgeon: Donato Heinz, MD;  Location: [facility] ORS;  Service: Orthopedics;  Laterality: Left;   Family History  Problem Relation Age of Onset   Emphysema Father        Smoker   CAD Mother     Stroke Mother    Diabetes Brother    Seizures Brother    Cancer Brother        unknown   Diabetes Brother    Social History   Socioeconomic History   Marital status: Divorced    Spouse name: Not on file   Number of children: 1   Years of education: Not on file   Highest education level: 12th grade  Occupational History   Occupation: Retired  Tobacco Use   Smoking status: Former    Current packs/day: 0.00    Average packs/day: 0.3 packs/day for 1 year (0.3 ttl pk-yrs)    Types: Cigarettes    Start date: 72    Quit date: 1966    Years since quitting: 58.6   Smokeless tobacco: Never   Tobacco comments:    smoking cessation materials not required  Vaping Use   Vaping status: Never Used  Substance and Sexual Activity   Alcohol use: No    Alcohol/week: 0.0 standard drinks of alcohol   Drug use: No   Sexual activity: Not Currently  Other Topics Concern   Not on file  Social History Narrative   Pt lives alone.    Social Determinants of Health   Financial Resource Strain: Low Risk  (09/04/2022)   Overall Financial Resource Strain (CARDIA)    Difficulty of Paying Living Expenses: Not hard at all  Food Insecurity: No Food Insecurity (09/04/2022)   Hunger Vital Sign    Worried About Running Out of Food in the Last Year: Never true    Ran Out of Food in the Last Year: Never true  Transportation Needs: No Transportation Needs (09/04/2022)   PRAPARE - Administrator, Civil Service (Medical): No    Lack of Transportation (Non-Medical): No  Physical Activity: Inactive (09/04/2022)   Exercise Vital Sign    Days of Exercise per Week: 0 days    Minutes of Exercise per Session: 0 min  Stress: No Stress Concern Present (09/04/2022)   [employer] of Occupational Health - Occupational Stress Questionnaire    Feeling of Stress : Not at all  Social Connections: Moderately Integrated (09/04/2022)   Social Connection and Isolation Panel [NHANES]    Frequency of  Communication with Friends and Family: More than three times a week    Frequency of Social Gatherings with Friends and Family: More than three times a week    Attends Religious Services: More than 4 times per year    Active Member of [employer] or Organizations: Yes    Attends Banker Meetings: Never    Marital Status: Divorced    Tobacco Counseling Counseling given: Not Answered Tobacco comments: smoking cessation materials not required   Clinical Intake:  Pre-visit preparation completed: Yes  Pain : No/denies pain  BMI - recorded: 23.74 Nutritional Risks: None Diabetes: Yes CBG done?: No Did pt. bring in CBG monitor from home?: No  How often do you need to have someone help you when you read instructions, pamphlets, or other written materials from your doctor or pharmacy?: 1 - Never  Interpreter Needed?: No  Comments: lives alone Information entered by :: B.Angelle Isais,LPN   Activities of Daily Living    09/04/2022    8:37 AM 06/01/2022   10:48 AM  In your present state of health, do you have any difficulty performing the following activities:  Hearing? 0 0  Vision? 0 0  Difficulty concentrating or making decisions? 0 0  Walking or climbing stairs? 0 0  Dressing or bathing? 0 0  Doing errands, shopping? 0 0  Preparing Food and eating ? N   Using the Toilet? N   In the past six months, have you accidently leaked urine? N   Do you have problems with loss of bowel control? N   Managing your Medications? N   Managing your Finances? N   Housekeeping or managing your Housekeeping? N     Patient Care Team: Alba Cory, MD as PCP - General (Family Medicine) Lockie Mola, MD as Consulting Physician (Ophthalmology) Alwyn Pea, MD as Consulting Physician (Cardiology) Ernest Pine, Illene Labrador, MD as Consulting Physician (Orthopedic Surgery) Hildred Laser, MD as Consulting Physician (Obstetrics and Gynecology) Jimmye Norman, NP as Nurse  Practitioner (Neurology) [facility] (Acute Care)  Indicate any recent Medical Services you may have received from other than [facility] providers in the past year (date may be approximate).     Assessment:   This is a routine wellness examination for Catherine Burgess.  Hearing/Vision screen Hearing Screening - Comments:: Adequate hearing:small loss Vision Screening - Comments:: Adequate vision w/glasses Dr Inez Pilgrim  Dietary issues and exercise activities discussed:     Goals Addressed               This Visit's Progress     Blood pressure medicines (pt-stated)   On track     Current Barriers:  Knowledge Deficits related to medicines used to treat high blood pressure  Pharmacist Clinical Goal(s):  Over the next 30 days, patient will demonstrate improved understanding of prescribed medications and rationale for usage as evidenced by patient report  Interventions: Comprehensive medication review performed. Collaboration with RN Care Manager re: blood pressure education Education around the DASH diet  Patient Self Care Activities:  Self administers medications as prescribed Check blood pressure at home and record blood pressures in notebook  Initial goal documentation       I don't really check my blood pressure as often as I should (pt-stated)   On track     Current Barriers:  Knowledge Deficits related to basic understanding of hypertension pathophysiology and self care management  Nurse Case Manager Clinical Goal(s):  Over the next 30 days, patient will continue to adhere to hypertension self care management plan of care-goal met 09/28/2018 Over the next 90 days, patient will continue to adhere to hypertension self care management plan of care including checking and recording BP 3 x week, taking medications as prescribed, exercising as tolerated, and adhering to a low sodium diet  Interventions:  Assessed for compliance with HTN self care management plan  of care Reviewed home BP log Reviewed dietary choices and discussed importance of low sodium diet Assess for for daily exercise and encouraged patient to walk daily as weather permits Discussed plans  with patient for ongoing care management follow up and provided patient with direct contact information for care management team Advised patient, providing education and rationale, to monitor blood pressure daily and record, calling PCP for findings outside established parameters.   Patient Self Care Activities:  Self administers medications as prescribed Attends all scheduled provider appointments Calls provider office for new concerns, questions, or BP outside discussed parameters Checks BP and records as discussed Follows a low sodium diet/DASH diet  Please see past updates related to this goal by clicking on the "Past Updates" button in the selected goal        Increase water intake   On track     Recommend increasing water intake to 4-5 glasses a day.      Prevent falls   On track     Recommend to remove any items from the home that may cause slips or trips.       Depression Screen    09/04/2022    8:33 AM 06/01/2022   10:48 AM 01/30/2022   10:50 AM 09/29/2021   11:02 AM 06/30/2021    3:00 PM 05/09/2021   11:09 AM 03/07/2021   10:15 AM  PHQ 2/9 Scores  PHQ - 2 Score 0 0 0 0 0 0 0  PHQ- 9 Score  0 2 0 0 0 0    Fall Risk    09/04/2022    8:26 AM 06/01/2022   10:48 AM 01/30/2022   10:49 AM 09/29/2021   11:02 AM 06/30/2021    3:00 PM  Fall Risk   Falls in the past year? 0 0 0 0 0  Number falls in past yr: 0  0 0 0  Injury with Fall? 0  0 0 0  Risk for fall due to : No Fall Risks No Fall Risks Impaired balance/gait Impaired balance/gait   Follow up Education provided;Falls prevention discussed Falls prevention discussed;Education provided;Falls evaluation completed Falls prevention discussed Falls prevention discussed     MEDICARE RISK AT HOME:  Medicare Risk at Home -  09/04/22 0827     Any stairs in or around the home? Yes   2-3 outside   If so, are there any without handrails? Yes    Home free of loose throw rugs in walkways, pet beds, electrical cords, etc? Yes    Adequate lighting in your home to reduce risk of falls? Yes    Life alert? No    Use of a cane, walker or w/c? Yes   cane sometimes   Grab bars in the bathroom? Yes    Shower chair or bench in shower? No    Elevated toilet seat or a handicapped toilet? Yes             TIMED UP AND GO:  Was the test performed?  No    Cognitive Function:        09/04/2022    8:41 AM 10/17/2019   10:57 AM 10/11/2018   10:55 AM 10/07/2017   12:54 PM  6CIT Screen  What Year? 0 points 0 points 0 points 0 points  What month? 0 points 0 points 0 points 3 points  What time? 0 points 0 points 0 points 0 points  Count back from 20 0 points 0 points 0 points 0 points  Months in reverse 4 points 0 points 0 points 0 points  Repeat phrase 6 points 6 points 4 points 8 points  Total Score 10 points 6 points 4 points 11  points    Immunizations Immunization History  Administered Date(s) Administered   Fluad Quad(high Dose 65+) 09/30/2018, 10/17/2019, 11/04/2020, 01/30/2022   Influenza, High Dose Seasonal PF 10/13/2016, 01/20/2018   Influenza-Unspecified 10/31/2014, 12/05/2015   PFIZER(Purple Top)SARS-COV-2 Vaccination 03/03/2019, 03/25/2019, 12/18/2019   PPD Test 08/02/2019   Pfizer Covid-19 Vaccine Bivalent Booster 108yrs & up 12/17/2020   Pneumococcal Conjugate-13 04/26/2013   Pneumococcal Polysaccharide-23 10/13/2016   Tdap 08/23/2012    TDAP status: Up to date  Flu Vaccine status: Up to date  Pneumococcal vaccine status: Up to date  Covid-19 vaccine status: Completed vaccines  Qualifies for Shingles Vaccine? Yes   Zostavax completed No   Shingrix Completed?: No.    Education has been provided regarding the importance of this vaccine. Patient has been advised to call insurance company to  determine out of pocket expense if they have not yet received this vaccine. Advised may also receive vaccine at local pharmacy or Health Dept. Verbalized acceptance and understanding.  Screening Tests Health Maintenance  Topic Date Due   Zoster Vaccines- Shingrix (1 of 2) Never done   COVID-19 Vaccine (5 - 2023-24 season) 10/10/2021   DTaP/Tdap/Td (2 - Td or Tdap) 08/24/2022   INFLUENZA VACCINE  09/10/2022   HEMOGLOBIN A1C  12/01/2022   Diabetic kidney evaluation - eGFR measurement  01/31/2023   Diabetic kidney evaluation - Urine ACR  06/01/2023   FOOT EXAM  06/01/2023   OPHTHALMOLOGY EXAM  07/17/2023   Medicare Annual Wellness (AWV)  09/04/2023   Pneumonia Vaccine 26+ Years old  Completed   DEXA SCAN  Completed   HPV VACCINES  Aged Out    Health Maintenance  Health Maintenance Due  Topic Date Due   Zoster Vaccines- Shingrix (1 of 2) Never done   COVID-19 Vaccine (5 - 2023-24 season) 10/10/2021   DTaP/Tdap/Td (2 - Td or Tdap) 08/24/2022    Colorectal cancer screening: No longer required.   Mammogram status: No longer required due to age.  Bone Density status: Completed yes. Results reflect: Bone density results: OSTEOPENIA. Repeat every 3-5 years.pt declines  Lung Cancer Screening: (Low Dose CT Chest recommended if Age 96-80 years, 20 pack-year currently smoking OR have quit w/in 15years.) does not qualify.   Lung Cancer Screening Referral: no  Additional Screening:  Hepatitis C Screening: does not qualify; Completed yes  Vision Screening: Recommended annual ophthalmology exams for early detection of glaucoma and other disorders of the eye. Is the patient up to date with their annual eye exam?  Yes  Who is the provider or what is the name of the office in which the patient attends annual eye exams? Dr Inez Pilgrim If pt is not established with a provider, would they like to be referred to a provider to establish care? No .   Dental Screening: Recommended annual dental  exams for proper oral hygiene  Diabetic Foot Exam: Diabetic Foot Exam: Completed yes  Community Resource Referral / Chronic Care Management: CRR required this visit?  No   CCM required this visit?  No     Plan:     I have personally reviewed and noted the following in the patient's chart:   Medical and social history Use of alcohol, tobacco or illicit drugs  Current medications and supplements including opioid prescriptions. Patient is not currently taking opioid prescriptions. Functional ability and status Nutritional status Physical activity Advanced directives List of other physicians Hospitalizations, surgeries, and ER visits in previous 12 months Vitals Screenings to include cognitive, depression, and falls  Referrals and appointments  In addition, I have reviewed and discussed with patient certain preventive protocols, quality metrics, and best practice recommendations. A written personalized care plan for preventive services as well as general preventive health recommendations were provided to patient.     Sue Lush, LPN   1/61/0960   After Visit Summary: (Declined) Due to this being a telephonic visit, with patients personalized plan was offered to patient but patient Declined AVS at this time   Nurse Notes: The patient states she is doing well and has no concerns or questions at this time.

## 2022-09-04 NOTE — Patient Instructions (Signed)
Catherine Burgess , Thank you for taking time to come for your Medicare Wellness Visit. I appreciate your ongoing commitment to your health goals. Please review the following plan we discussed and let me know if I can assist you in the future.   Referrals/Orders/Follow-Ups/Clinician Recommendations: none  This is a list of the screening recommended for you and due dates:  Health Maintenance  Topic Date Due   Zoster (Shingles) Vaccine (1 of 2) Never done   COVID-19 Vaccine (5 - 2023-24 season) 10/10/2021   DTaP/Tdap/Td vaccine (2 - Td or Tdap) 08/24/2022   Flu Shot  09/10/2022   Hemoglobin A1C  12/01/2022   Yearly kidney function blood test for diabetes  01/31/2023   Yearly kidney health urinalysis for diabetes  06/01/2023   Complete foot exam   06/01/2023   Eye exam for diabetics  07/17/2023   Medicare Annual Wellness Visit  09/04/2023   Pneumonia Vaccine  Completed   DEXA scan (bone density measurement)  Completed   HPV Vaccine  Aged Out    Advanced directives: (Copy Requested) Please bring a copy of your health care power of attorney and living will to the office to be added to your chart at your convenience.  Next Medicare Annual Wellness Visit scheduled for next year: Yes 09/09/23 @ 8:45am telephone  Preventive Care 65 Years and Older, Female Preventive care refers to lifestyle choices and visits with your health care provider that can promote health and wellness. What does preventive care include? A yearly physical exam. This is also called an annual well check. Dental exams once or twice a year. Routine eye exams. Ask your health care provider how often you should have your eyes checked. Personal lifestyle choices, including: Daily care of your teeth and gums. Regular physical activity. Eating a healthy diet. Avoiding tobacco and drug use. Limiting alcohol use. Practicing safe sex. Taking low-dose aspirin every day. Taking vitamin and mineral supplements as recommended by  your health care provider. What happens during an annual well check? The services and screenings done by your health care provider during your annual well check will depend on your age, overall health, lifestyle risk factors, and family history of disease. Counseling  Your health care provider may ask you questions about your: Alcohol use. Tobacco use. Drug use. Emotional well-being. Home and relationship well-being. Sexual activity. Eating habits. History of falls. Memory and ability to understand (cognition). Work and work Astronomer. Reproductive health. Screening  You may have the following tests or measurements: Height, weight, and BMI. Blood pressure. Lipid and cholesterol levels. These may be checked every 5 years, or more frequently if you are over 47 years old. Skin check. Lung cancer screening. You may have this screening every year starting at age 29 if you have a 30-pack-year history of smoking and currently smoke or have quit within the past 15 years. Fecal occult blood test (FOBT) of the stool. You may have this test every year starting at age 29. Flexible sigmoidoscopy or colonoscopy. You may have a sigmoidoscopy every 5 years or a colonoscopy every 10 years starting at age 36. Hepatitis C blood test. Hepatitis B blood test. Sexually transmitted disease (STD) testing. Diabetes screening. This is done by checking your blood sugar (glucose) after you have not eaten for a while (fasting). You may have this done every 1-3 years. Bone density scan. This is done to screen for osteoporosis. You may have this done starting at age 105. Mammogram. This may be done every 1-2 years.  Talk to your health care provider about how often you should have regular mammograms. Talk with your health care provider about your test results, treatment options, and if necessary, the need for more tests. Vaccines  Your health care provider may recommend certain vaccines, such as: Influenza  vaccine. This is recommended every year. Tetanus, diphtheria, and acellular pertussis (Tdap, Td) vaccine. You may need a Td booster every 10 years. Zoster vaccine. You may need this after age 71. Pneumococcal 13-valent conjugate (PCV13) vaccine. One dose is recommended after age 24. Pneumococcal polysaccharide (PPSV23) vaccine. One dose is recommended after age 67. Talk to your health care provider about which screenings and vaccines you need and how often you need them. This information is not intended to replace advice given to you by your health care provider. Make sure you discuss any questions you have with your health care provider. Document Released: 02/22/2015 Document Revised: 10/16/2015 Document Reviewed: 11/27/2014 Elsevier Interactive Patient Education  2017 [employer].  Fall Prevention in the Home Falls can cause injuries. They can happen to people of all ages. There are many things you can do to make your home safe and to help prevent falls. What can I do on the outside of my home? Regularly fix the edges of walkways and driveways and fix any cracks. Remove anything that might make you trip as you walk through a door, such as a raised step or threshold. Trim any bushes or trees on the path to your home. Use bright outdoor lighting. Clear any walking paths of anything that might make someone trip, such as rocks or tools. Regularly check to see if handrails are loose or broken. Make sure that both sides of any steps have handrails. Any raised decks and porches should have guardrails on the edges. Have any leaves, snow, or ice cleared regularly. Use sand or salt on walking paths during winter. Clean up any spills in your garage right away. This includes oil or grease spills. What can I do in the bathroom? Use night lights. Install grab bars by the toilet and in the tub and shower. Do not use towel bars as grab bars. Use non-skid mats or decals in the tub or shower. If you  need to sit down in the shower, use a plastic, non-slip stool. Keep the floor dry. Clean up any water that spills on the floor as soon as it happens. Remove soap buildup in the tub or shower regularly. Attach bath mats securely with double-sided non-slip rug tape. Do not have throw rugs and other things on the floor that can make you trip. What can I do in the bedroom? Use night lights. Make sure that you have a light by your bed that is easy to reach. Do not use any sheets or blankets that are too big for your bed. They should not hang down onto the floor. Have a firm chair that has side arms. You can use this for support while you get dressed. Do not have throw rugs and other things on the floor that can make you trip. What can I do in the kitchen? Clean up any spills right away. Avoid walking on wet floors. Keep items that you use a lot in easy-to-reach places. If you need to reach something above you, use a strong step stool that has a grab bar. Keep electrical cords out of the way. Do not use floor polish or wax that makes floors slippery. If you must use wax, use non-skid floor wax.  Do not have throw rugs and other things on the floor that can make you trip. What can I do with my stairs? Do not leave any items on the stairs. Make sure that there are handrails on both sides of the stairs and use them. Fix handrails that are broken or loose. Make sure that handrails are as long as the stairways. Check any carpeting to make sure that it is firmly attached to the stairs. Fix any carpet that is loose or worn. Avoid having throw rugs at the top or bottom of the stairs. If you do have throw rugs, attach them to the floor with carpet tape. Make sure that you have a light switch at the top of the stairs and the bottom of the stairs. If you do not have them, ask someone to add them for you. What else can I do to help prevent falls? Wear shoes that: Do not have high heels. Have rubber  bottoms. Are comfortable and fit you well. Are closed at the toe. Do not wear sandals. If you use a stepladder: Make sure that it is fully opened. Do not climb a closed stepladder. Make sure that both sides of the stepladder are locked into place. Ask someone to hold it for you, if possible. Clearly mark and make sure that you can see: Any grab bars or handrails. First and last steps. Where the edge of each step is. Use tools that help you move around (mobility aids) if they are needed. These include: Canes. Walkers. Scooters. Crutches. Turn on the lights when you go into a dark area. Replace any light bulbs as soon as they burn out. Set up your furniture so you have a clear path. Avoid moving your furniture around. If any of your floors are uneven, fix them. If there are any pets around you, be aware of where they are. Review your medicines with your doctor. Some medicines can make you feel dizzy. This can increase your chance of falling. Ask your doctor what other things that you can do to help prevent falls. This information is not intended to replace advice given to you by your health care provider. Make sure you discuss any questions you have with your health care provider. Document Released: 11/22/2008 Document Revised: 07/04/2015 Document Reviewed: 03/02/2014 Elsevier Interactive Patient Education  2017 [employer].

## 2022-09-05 IMAGING — CT CT HEAD W/O CM
3 series · 15 of 46 positions shown, 18 images · non-contrast
Comparison: 10/24/2017, 07/23/2016

CLINICAL DATA: Recent fall at work, hitting the back of her head,
headache and neck pain

EXAM:
CT HEAD WITHOUT CONTRAST
CT CERVICAL SPINE WITHOUT CONTRAST
TECHNIQUE: Multidetector CT imaging of the head and cervical spine was
performed following the standard protocol without intravenous
contrast. Multiplanar CT image reconstructions of the cervical spine
were also generated.

[Series 2: head wo · axial · 0.40mm/px · z∈[+189,+309]mm · 9 of 29 slices shown, 12 images]
[im 3/29  brain]
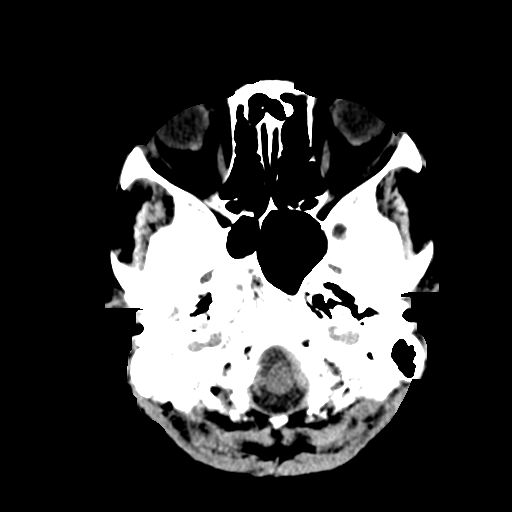
[im 3/29  bone]
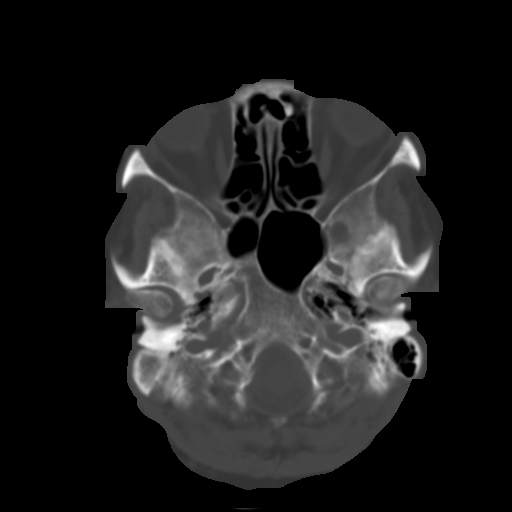
[im 6/29  brain]
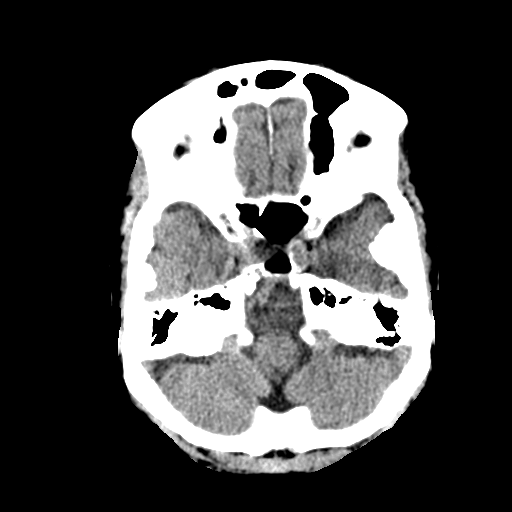
[im 9/29  brain]
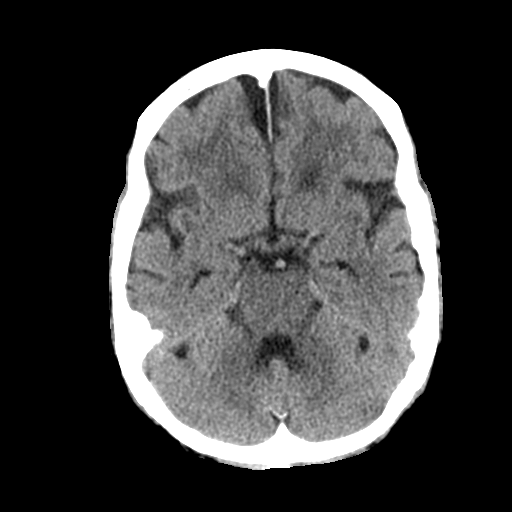
[im 12/29  brain]
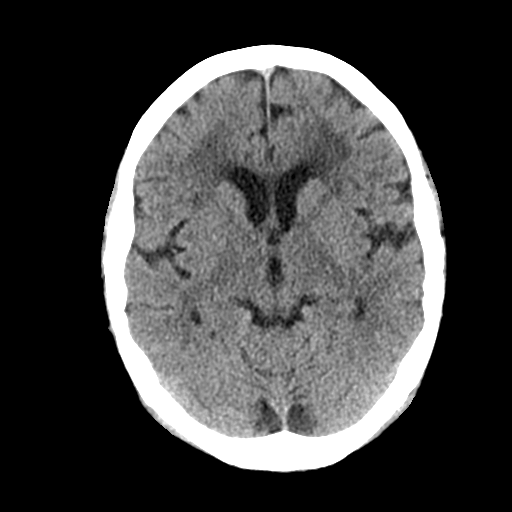
[im 15/29  brain]
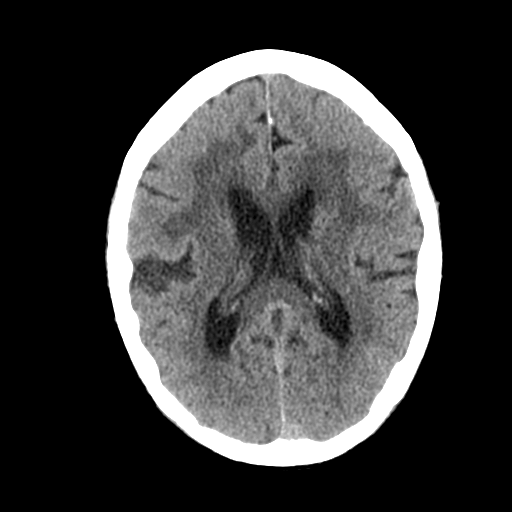
[im 15/29  bone]
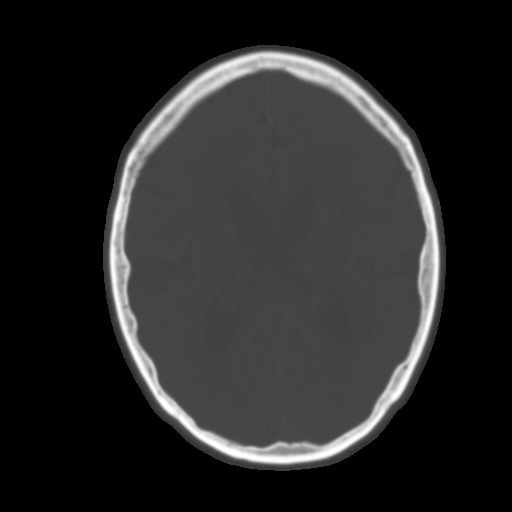
[im 18/29  brain]
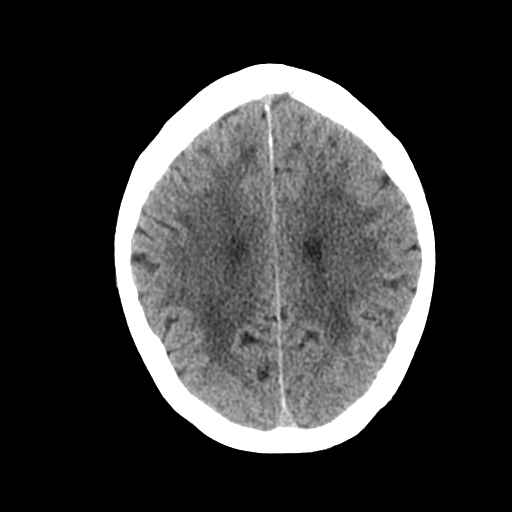
[im 21/29  brain]
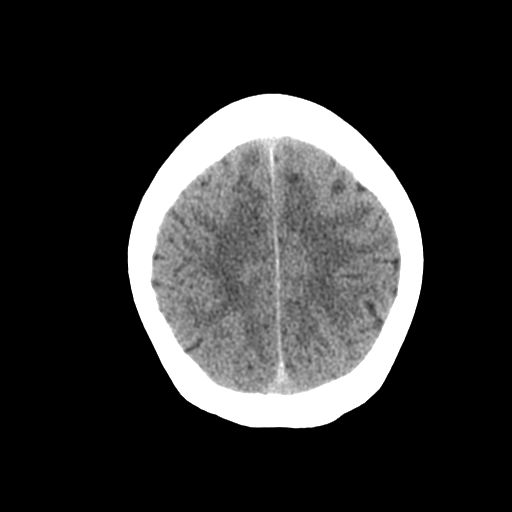
[im 24/29  brain]
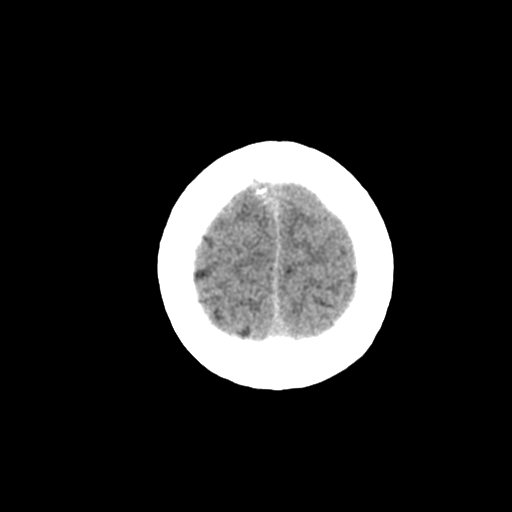
[im 27/29  brain]
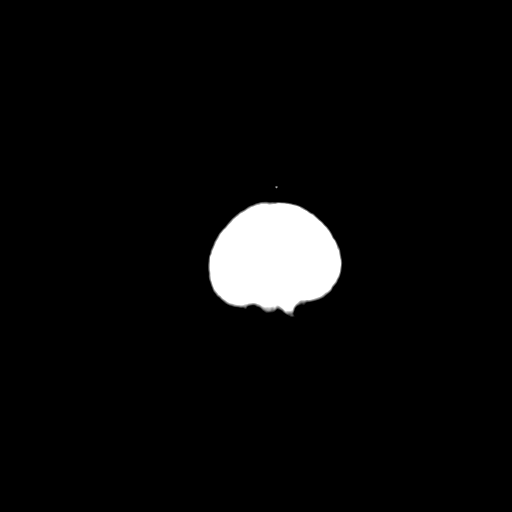
[im 27/29  bone]
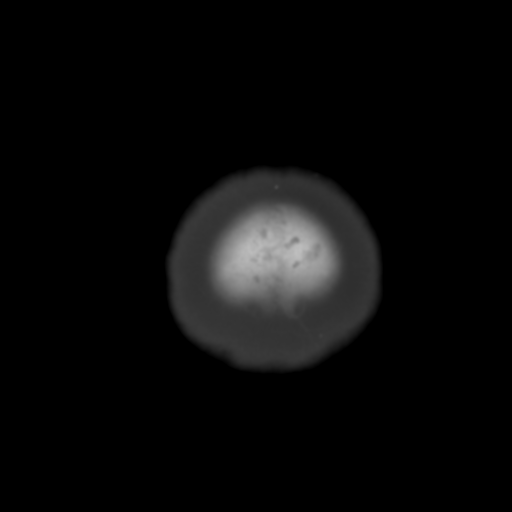

[Series 4: coronal soft tissue · coronal · 0.29mm/px · 3 of 62 slices shown]
[im 21/62  brain]
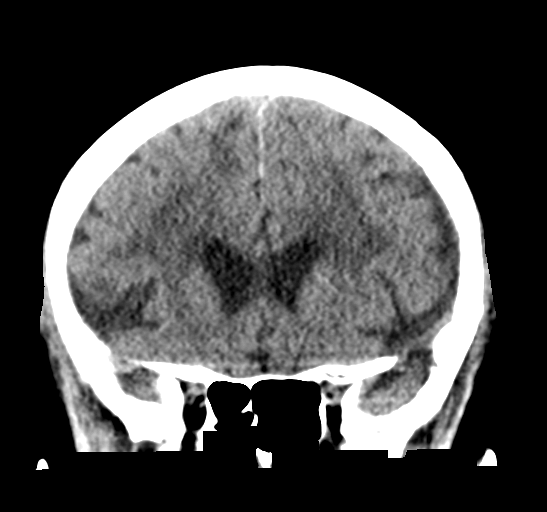
[im 28/62  brain]
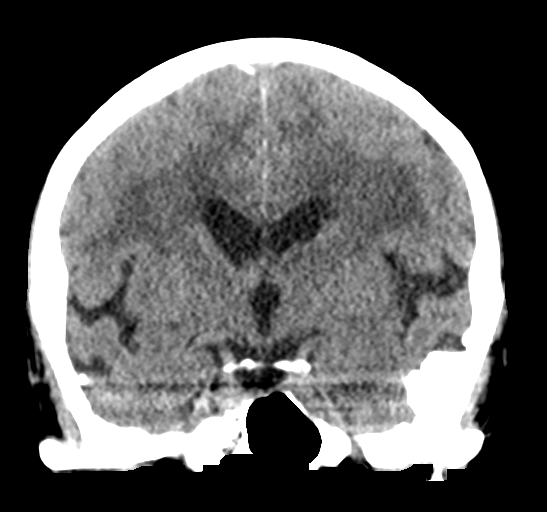
[im 34/62  brain]
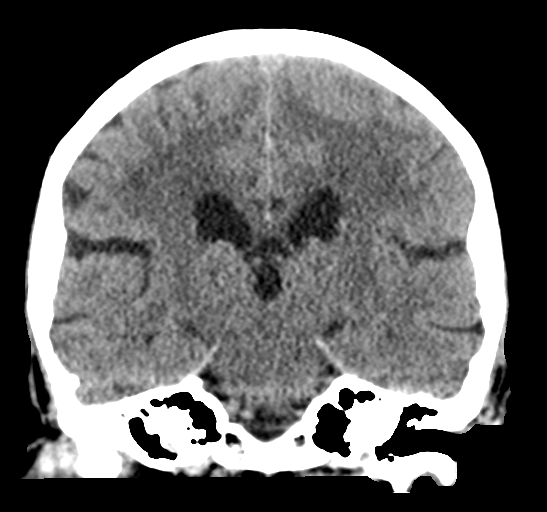

[Series 5: sagittal soft tissue · sagittal · 0.29mm/px · 3 of 53 slices shown]
[im 18/53  brain]
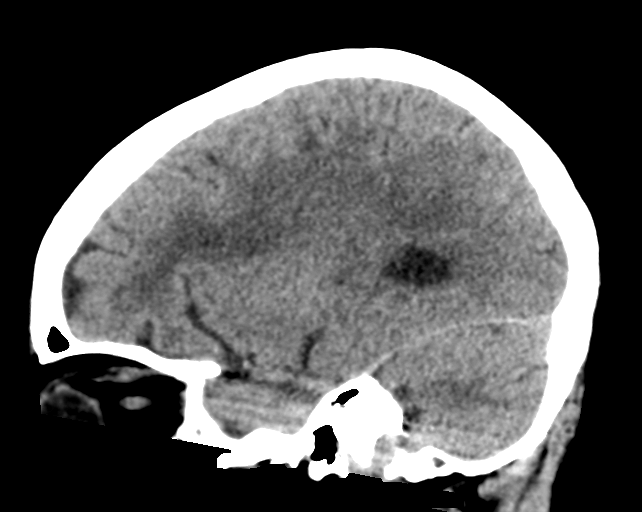
[im 27/53  brain]
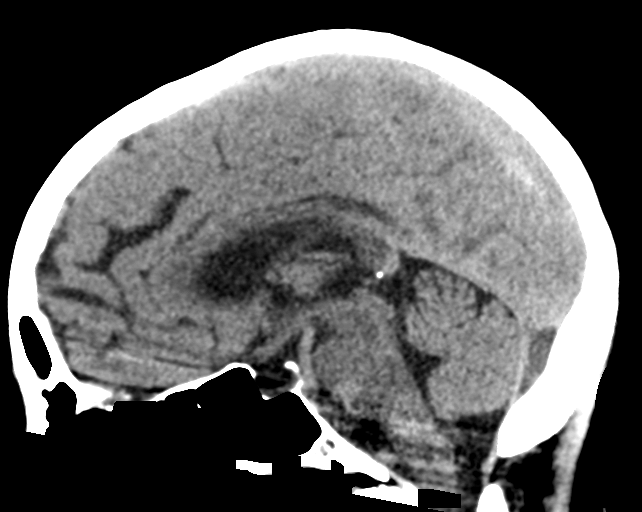
[im 35/53  brain]
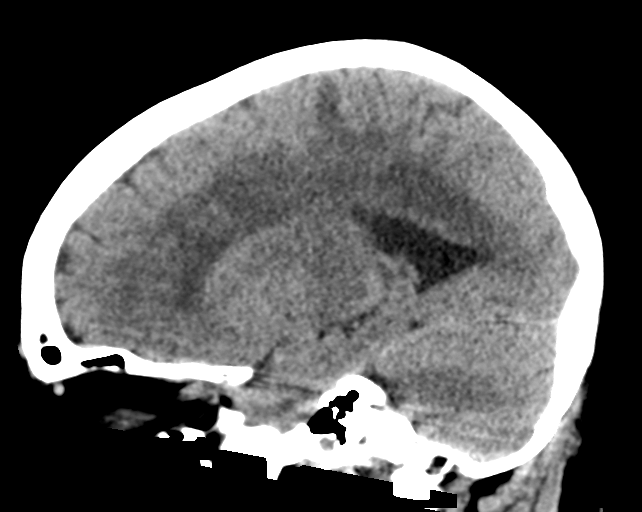

[15 of 46 positions shown; findings below may reference images not displayed]

FINDINGS: CT HEAD FINDINGS

Brain: Minor generalized atrophy pattern with extensive white matter
microvascular ischemic changes throughout the both cerebral
hemispheres. Overall stable appearance. No acute intracranial
hemorrhage, new mass lesion, new infarction, midline shift,
herniation, hydrocephalus, or extra-axial fluid collection. Cisterns
are patent. Cerebellar atrophy as well.

Vascular: No hyperdense vessel or unexpected calcification.

Skull: Negative for fracture. Stable left dural base calcification
in the temporal region.

Sinuses/Orbits: No acute finding.

Other: None.

CT CERVICAL SPINE FINDINGS

Alignment: Normal.

Skull base and vertebrae: No acute fracture. No primary bone lesion
or focal pathologic process.

Soft tissues and spinal canal: No prevertebral fluid or swelling. No
visible canal hematoma.

Disc levels: Multilevel moderate degenerative disc disease of the
entire cervical spine with disc space narrowing, sclerosis and
endplate osteophytes. Degenerative changes of the C1-2 articulation
as well.

Facets are aligned.  No subluxation or dislocation.

Upper chest: Negative.

Other: None.
IMPRESSION: Stable atrophy and white matter microvascular ischemic change. No
acute intracranial abnormality by CT.

Stable dense dural base calcification left temporal lobe, probable
meningioma or osteoma as previously described.

Cervical degenerative changes without acute osseous finding,
fracture or malalignment.

## 2022-11-10 ENCOUNTER — Other Ambulatory Visit: Payer: Self-pay | Admitting: Family Medicine

## 2022-11-10 DIAGNOSIS — E782 Mixed hyperlipidemia: Secondary | ICD-10-CM

## 2022-11-13 NOTE — Progress Notes (Deleted)
Name: Catherine Burgess   MRN: [id number]    DOB: 08/26/1935   Date:11/13/2022       Progress Note  Subjective  Chief Complaint  Follow Up  HPI  Abnormal MRI brain from 2018 also abnormal CT brain from 06/2017. CT showed calcified meningioma, she denies headaches, dizziness or neuro deficitis.   Last syncope was 2019, last CT brain and c-spine done 11/2019 stable findings  . She is under the care of Dr. Sherryll Burger   Neuropathy: seen by neurologist, she takes gabapentin but states down to two capsules at night only,   and tingling and numbness has resolved. .She is back on B12 only taking three times a week and last level was at goal    WU:[id number] well at this time. Stable   GERD: she is taking Pantoprazole once a day, no heartburn or indigestion now. She was seen by dr. Allegra Lai in May and had repeat colonoscopy due to change in bowel movements ( she also has a history of volvulus ) , colonoscopy showed diverticulosis but no other problems She states bowel movements goes from constipation to loose stools but controlled with a high fiber diet. She has intermittent LUQ pain, recent amylase and lipase negative. Symptoms are mild and intermittent Advised to monitor to see if pain is more frequent when she has constipation    OA/s/p left knee replacement: she has intermittent pain, taking Tylenol prn now, no effusion or redness. She is no longer taking meloxicam She also have back pain intermittent and muscle relaxer also prn     Hyperlipidemia: taking medication, denies myalgia, LDL has been at goal at 51 , continue medication   HTN: currently doing well on losartan 50 mg and norvasc 2.5 mg,  BP is at goal  Denies headaches, dizziness or chest pain. She needs refills today   Osteopenia: reviewed last bone density 2020, continue high calcium diet , and take vitamin D 2000 units daily , also needs to continue being physically active. Advised to get bone density done but she is not interested at this time ,  she states she would not take medication for it   Diabetes diet controlled with dyslipidemia : today A1C is 6 %, she states eating very healthy, she denies polyphagia, polydipsia or polyuria.  She takes statins and ARB.  PAD: failed ABI screen , saw vascular surgeon and was advised to go back in 6 months but not interested in going back at this time due to cost of visit and testing Unchanged. She does not walk very far   Patient Active Problem List   Diagnosis Date Noted   B12 deficiency 06/01/2022   Peripheral polyneuropathy 06/01/2022   Dyslipidemia associated with type 2 diabetes mellitus (HCC) 06/01/2022   PAD (peripheral artery disease) (HCC) 01/30/2022   Diabetes mellitus type 2, diet-controlled (HCC) 05/11/2021   Altered bowel habits    Abnormal ankle brachial index (ABI) 03/02/2018   Calcified cerebral meningioma (HCC) 10/04/2017   History of CVA (cerebrovascular accident) without residual deficits 10/04/2017   Subacromial impingement of right shoulder 10/04/2017   History of anemia 10/13/2016   Abnormal CT of brain 07/23/2016   Vitamin D deficiency 10/28/2015   Post menopausal syndrome 10/28/2015   Osteopenia of multiple sites 10/28/2015   Cervical disc disease 10/28/2015   H/O total knee replacement 01/20/2015   Vaginal atrophy 09/26/2014   Osteoarthritis of both knees 07/31/2014   GERD without esophagitis 07/31/2014   HLD (hyperlipidemia) 07/31/2014   Essential hypertension  07/31/2014    Past Surgical History:  Procedure Laterality Date   CATARACT EXTRACTION, BILATERAL     COLONOSCOPY WITH PROPOFOL N/A 06/20/2020   Procedure: COLONOSCOPY WITH PROPOFOL;  Surgeon: Toney Reil, MD;  Location: [facility] ENDOSCOPY;  Service: Gastroenterology;  Laterality: N/A;   EYE SURGERY     HIP PINNING Left 1956   JOINT REPLACEMENT     REPLACEMENT TOTAL KNEE Left 11/12/2009   TOTAL KNEE REVISION Left 12/07/2016   Procedure: TOTAL KNEE REVISION;  Surgeon: Donato Heinz, MD;   Location: [facility] ORS;  Service: Orthopedics;  Laterality: Left;    Family History  Problem Relation Age of Onset   Emphysema Father        Smoker   CAD Mother    Stroke Mother    Diabetes Brother    Seizures Brother    Cancer Brother        unknown   Diabetes Brother     Social History   Tobacco Use   Smoking status: Former    Current packs/day: 0.00    Average packs/day: 0.3 packs/day for 1 year (0.3 ttl pk-yrs)    Types: Cigarettes    Start date: 37    Quit date: 1966    Years since quitting: 58.7   Smokeless tobacco: Never   Tobacco comments:    smoking cessation materials not required  Substance Use Topics   Alcohol use: No    Alcohol/week: 0.0 standard drinks of alcohol     Current Outpatient Medications:    acetaminophen (TYLENOL) 500 MG tablet, Take 1 tablet (500 mg total) by mouth 2 (two) times daily. (Patient taking differently: Take 500 mg by mouth every 6 (six) hours as needed for mild pain.), Disp: 180 tablet, Rfl: 0   amLODipine (NORVASC) 2.5 MG tablet, Take 1 tablet (2.5 mg total) by mouth daily., Disp: 90 tablet, Rfl: 1   aspirin EC 81 MG tablet, Take 81 mg by mouth., Disp: , Rfl:    atorvastatin (LIPITOR) 40 MG tablet, TAKE 1 TABLET(40 MG) BY MOUTH DAILY, Disp: 30 tablet, Rfl: 0   baclofen (LIORESAL) 20 MG tablet, Take 1 tablet (20 mg total) by mouth at bedtime as needed for muscle spasms., Disp: 90 each, Rfl: 0   Calcium-Vitamin D 600-200 MG-UNIT tablet, Take 1 tablet by mouth daily. , Disp: , Rfl:    fluticasone (FLONASE) 50 MCG/ACT nasal spray, Place into the nose., Disp: , Rfl:    gabapentin (NEURONTIN) 300 MG capsule, Take 2 capsules (600 mg total) by mouth at bedtime., Disp: 180 capsule, Rfl: 1   losartan (COZAAR) 50 MG tablet, Take 1 tablet (50 mg total) by mouth daily., Disp: 90 tablet, Rfl: 1   pantoprazole (PROTONIX) 40 MG tablet, Take 1 tablet (40 mg total) by mouth daily., Disp: 90 tablet, Rfl: 1  Allergies  Allergen Reactions   Lovastatin  Other (See Comments)    chest   Pravastatin     Other reaction(s): Muscle Pain chest   Rosuvastatin     Other reaction(s): Muscle Pain chest Other reaction(s): Muscle Pain chest   Statins     Other reaction(s): Muscle Pain chest    I personally reviewed active problem list, medication list, allergies, family history, social history, health maintenance with the patient/caregiver today.   ROS  ***  Objective  There were no vitals filed for this visit.  There is no height or weight on file to calculate BMI.  Physical Exam ***  No results found for  this or any previous visit (from the past 2160 hour(s)).   PHQ2/9:    09/04/2022    8:33 AM 06/01/2022   10:48 AM 01/30/2022   10:50 AM 09/29/2021   11:02 AM 06/30/2021    3:00 PM  Depression screen PHQ 2/9  Decreased Interest 0 0 0 0 0  Down, Depressed, Hopeless 0 0 0 0 0  PHQ - 2 Score 0 0 0 0 0  Altered sleeping  0 0 0 0  Tired, decreased energy  0 0 0 0  Change in appetite  0 2 0 0  Feeling bad or failure about yourself   0 0 0 0  Trouble concentrating  0 0 0 0  Moving slowly or fidgety/restless  0 0 0 0  Suicidal thoughts  0 0 0 0  PHQ-9 Score  0 2 0 0  Difficult doing work/chores     Not difficult at all    phq 9 is {gen pos [id number]}   Fall Risk:    09/04/2022    8:26 AM 06/01/2022   10:48 AM 01/30/2022   10:49 AM 09/29/2021   11:02 AM 06/30/2021    3:00 PM  Fall Risk   Falls in the past year? 0 0 0 0 0  Number falls in past yr: 0  0 0 0  Injury with Fall? 0  0 0 0  Risk for fall due to : No Fall Risks No Fall Risks Impaired balance/gait Impaired balance/gait   Follow up Education provided;Falls prevention discussed Falls prevention discussed;Education provided;Falls evaluation completed Falls prevention discussed Falls prevention discussed       Functional Status Survey:      Assessment & Plan  *** There are no diagnoses linked to this encounter.

## 2022-11-16 ENCOUNTER — Ambulatory Visit: Payer: Medicare Other | Admitting: Family Medicine

## 2022-11-18 ENCOUNTER — Encounter: Payer: Self-pay | Admitting: Physician Assistant

## 2022-11-18 ENCOUNTER — Ambulatory Visit (INDEPENDENT_AMBULATORY_CARE_PROVIDER_SITE_OTHER): Payer: Medicare Other | Admitting: Physician Assistant

## 2022-11-18 VITALS — BP 138/70 | HR 77 | Temp 98.0°F | Resp 16 | Ht 62.0 in | Wt 134.1 lb

## 2022-11-18 DIAGNOSIS — Z23 Encounter for immunization: Secondary | ICD-10-CM | POA: Diagnosis not present

## 2022-11-18 DIAGNOSIS — E119 Type 2 diabetes mellitus without complications: Secondary | ICD-10-CM

## 2022-11-18 DIAGNOSIS — I10 Essential (primary) hypertension: Secondary | ICD-10-CM

## 2022-11-18 DIAGNOSIS — E785 Hyperlipidemia, unspecified: Secondary | ICD-10-CM

## 2022-11-18 DIAGNOSIS — K219 Gastro-esophageal reflux disease without esophagitis: Secondary | ICD-10-CM

## 2022-11-18 DIAGNOSIS — E538 Deficiency of other specified B group vitamins: Secondary | ICD-10-CM

## 2022-11-18 DIAGNOSIS — M5090 Cervical disc disorder, unspecified, unspecified cervical region: Secondary | ICD-10-CM | POA: Diagnosis not present

## 2022-11-18 DIAGNOSIS — E1169 Type 2 diabetes mellitus with other specified complication: Secondary | ICD-10-CM

## 2022-11-18 DIAGNOSIS — G629 Polyneuropathy, unspecified: Secondary | ICD-10-CM | POA: Diagnosis not present

## 2022-11-18 MED ORDER — AMLODIPINE BESYLATE 2.5 MG PO TABS
2.5000 mg | ORAL_TABLET | Freq: Every day | ORAL | 1 refills | Status: DC
Start: 2022-11-18 — End: 2023-05-21

## 2022-11-18 MED ORDER — ATORVASTATIN CALCIUM 40 MG PO TABS
40.0000 mg | ORAL_TABLET | Freq: Every day | ORAL | 1 refills | Status: DC
Start: 2022-11-18 — End: 2023-05-21

## 2022-11-18 MED ORDER — GABAPENTIN 300 MG PO CAPS: 600.0000 mg | ORAL_CAPSULE | Freq: Every day | ORAL | 1 refills | Status: DC

## 2022-11-18 MED ORDER — LOSARTAN POTASSIUM 50 MG PO TABS
50.0000 mg | ORAL_TABLET | Freq: Every day | ORAL | 1 refills | Status: DC
Start: 2022-11-18 — End: 2023-01-17

## 2022-11-18 MED ORDER — PANTOPRAZOLE SODIUM 40 MG PO TBEC
40.0000 mg | DELAYED_RELEASE_TABLET | Freq: Every day | ORAL | 1 refills | Status: DC
Start: 2022-11-18 — End: 2023-05-21

## 2022-11-18 NOTE — Progress Notes (Signed)
Established Patient Office Visit  Name: Catherine Burgess   MRN: [id number]    DOB: November 08, 1935   Date:11/18/2022  Today's Provider: Jacquelin Hawking, MHS, PA-C Introduced myself to the patient as a PA-C and provided education on APPs in clinical practice.         Subjective  Chief Complaint  Chief Complaint  Patient presents with   Hypertension   Diabetes   Gastroesophageal Reflux    HPI   HYPERTENSION / HYPERLIPIDEMIA Satisfied with current treatment? yes Duration of hypertension: years BP monitoring frequency: not checking BP range:  BP medication side effects: no Past BP meds: Amlodipine 2.5 mg PO every day, Losartan 50 mg PO every day,  Duration of hyperlipidemia: years Cholesterol medication side effects: no Cholesterol supplements: none Past cholesterol medications: atorvastain (lipitor) Medication compliance: good compliance Aspirin: yes Recent stressors: no Recurrent headaches: no Visual changes: no Palpitations: yes- sometimes she has flutters  Dyspnea: no Chest pain: no Lower extremity edema: no Dizzy/lightheaded: no  Diabetes, Type 2 - Last A1c 6.0 - Medications: not taking any  - Compliance: NA - Checking BG at home: not checking - Eye exam: UTD - Foot exam: UTD - Microalbumin: UTD - Statin: On statin  - PNA vaccine: Completed  - Denies symptoms of hypoglycemia, polyuria, polydipsia, numbness extremities, foot ulcers/trauma   Patient Active Problem List   Diagnosis Date Noted   B12 deficiency 06/01/2022   Peripheral polyneuropathy 06/01/2022   Dyslipidemia associated with type 2 diabetes mellitus (HCC) 06/01/2022   PAD (peripheral artery disease) (HCC) 01/30/2022   Diabetes mellitus type 2, diet-controlled (HCC) 05/11/2021   Altered bowel habits    Abnormal ankle brachial index (ABI) 03/02/2018   Calcified cerebral meningioma (HCC) 10/04/2017   History of CVA (cerebrovascular accident) without residual deficits 10/04/2017    Subacromial impingement of right shoulder 10/04/2017   History of anemia 10/13/2016   Abnormal CT of brain 07/23/2016   Vitamin D deficiency 10/28/2015   Post menopausal syndrome 10/28/2015   Osteopenia of multiple sites 10/28/2015   Cervical disc disease 10/28/2015   H/O total knee replacement 01/20/2015   Vaginal atrophy 09/26/2014   Osteoarthritis of both knees 07/31/2014   GERD without esophagitis 07/31/2014   HLD (hyperlipidemia) 07/31/2014   Essential hypertension 07/31/2014    Past Surgical History:  Procedure Laterality Date   CATARACT EXTRACTION, BILATERAL     COLONOSCOPY WITH PROPOFOL N/A 06/20/2020   Procedure: COLONOSCOPY WITH PROPOFOL;  Surgeon: Toney Reil, MD;  [facility]: [facility] ENDOSCOPY;  Service: Gastroenterology;  Laterality: N/A;   EYE SURGERY     HIP PINNING Left 1956   JOINT REPLACEMENT     REPLACEMENT TOTAL KNEE Left 11/12/2009   TOTAL KNEE REVISION Left 12/07/2016   Procedure: TOTAL KNEE REVISION;  Surgeon: Donato Heinz, MD;  Location: [facility] ORS;  Service: Orthopedics;  Laterality: Left;    Family History  Problem Relation Age of Onset   Emphysema Father        Smoker   CAD Mother    Stroke Mother    Diabetes Brother    Seizures Brother    Cancer Brother        unknown   Diabetes Brother     Social History   Tobacco Use   Smoking status: Former    Current packs/day: 0.00    Average packs/day: 0.3 packs/day for 1 year (0.3 ttl pk-yrs)    Types: Cigarettes    Start date:  32    Quit date: 1966    Years since quitting: 58.8   Smokeless tobacco: Never   Tobacco comments:    smoking cessation materials not required  Substance Use Topics   Alcohol use: No    Alcohol/week: 0.0 standard drinks of alcohol     Current Outpatient Medications:    acetaminophen (TYLENOL) 500 MG tablet, Take 1 tablet (500 mg total) by mouth 2 (two) times daily. (Patient taking differently: Take 500 mg by mouth every 6 (six) hours as needed for mild pain.),  Disp: 180 tablet, Rfl: 0   aspirin EC 81 MG tablet, Take 81 mg by mouth., Disp: , Rfl:    baclofen (LIORESAL) 20 MG tablet, Take 1 tablet (20 mg total) by mouth at bedtime as needed for muscle spasms., Disp: 90 each, Rfl: 0   Calcium-Vitamin D 600-200 MG-UNIT tablet, Take 1 tablet by mouth daily. , Disp: , Rfl:    fluticasone (FLONASE) 50 MCG/ACT nasal spray, Place into the nose., Disp: , Rfl:    amLODipine (NORVASC) 2.5 MG tablet, Take 1 tablet (2.5 mg total) by mouth daily., Disp: 90 tablet, Rfl: 1   atorvastatin (LIPITOR) 40 MG tablet, Take 1 tablet (40 mg total) by mouth daily. TAKE 1 TABLET(40 MG) BY MOUTH DAILY, Disp: 90 tablet, Rfl: 1   gabapentin (NEURONTIN) 300 MG capsule, Take 2 capsules (600 mg total) by mouth at bedtime., Disp: 180 capsule, Rfl: 1   losartan (COZAAR) 50 MG tablet, Take 1 tablet (50 mg total) by mouth daily., Disp: 90 tablet, Rfl: 1   pantoprazole (PROTONIX) 40 MG tablet, Take 1 tablet (40 mg total) by mouth daily., Disp: 90 tablet, Rfl: 1  Allergies  Allergen Reactions   Lovastatin Other (See Comments)    chest   Pravastatin     Other reaction(s): Muscle Pain chest   Rosuvastatin     Other reaction(s): Muscle Pain chest Other reaction(s): Muscle Pain chest   Statins     Other reaction(s): Muscle Pain chest    I personally reviewed active problem list, medication list, allergies, health maintenance, notes from last encounter, lab results with the patient/caregiver today.   Review of Systems  Eyes:  Negative for blurred vision, double vision and photophobia.  Respiratory:  Negative for shortness of breath and wheezing.   Cardiovascular:  Positive for palpitations. Negative for chest pain and leg swelling.  Neurological:  Negative for dizziness, tingling and headaches.      Objective  Vitals:   11/18/22 0758 11/18/22 0853  BP: (!) 148/76 138/70  Pulse: 77   Resp: 16   Temp: 98 F (36.7 C)   TempSrc: Oral   SpO2: 97%   Weight: 134 lb 1.6 oz  (60.8 kg)   Height: 5\' 2"  (1.575 m)     Body mass index is 24.53 kg/m.  Physical Exam Vitals reviewed.  Constitutional:      General: She is awake.     Appearance: Normal appearance. She is well-developed.  HENT:     Head: Normocephalic.  Cardiovascular:     Rate and Rhythm: Normal rate and regular rhythm.     Pulses:          Radial pulses are 2+ on the right side and 2+ on the left side.     Heart sounds: No murmur heard.    No friction rub. No gallop.  Pulmonary:     Effort: Pulmonary effort is normal.     Breath sounds: Normal breath sounds. No  decreased air movement. No decreased breath sounds, wheezing, rhonchi or rales.  Musculoskeletal:     Right lower leg: No edema.     Left lower leg: 1+ Pitting Edema present.  Skin:    General: Skin is warm and dry.  Neurological:     General: No focal deficit present.     Mental Status: She is alert and oriented to person, place, and time.  Psychiatric:        Mood and Affect: Mood normal.        Behavior: Behavior is cooperative.        Thought Content: Thought content normal.        Judgment: Judgment normal.      No results found for this or any previous visit (from the past 2160 hour(s)).   PHQ2/9:    11/18/2022    7:50 AM 09/04/2022    8:33 AM 06/01/2022   10:48 AM 01/30/2022   10:50 AM 09/29/2021   11:02 AM  Depression screen PHQ 2/9  Decreased Interest 0 0 0 0 0  Down, Depressed, Hopeless 0 0 0 0 0  PHQ - 2 Score 0 0 0 0 0  Altered sleeping 0  0 0 0  Tired, decreased energy 0  0 0 0  Change in appetite 0  0 2 0  Feeling bad or failure about yourself  0  0 0 0  Trouble concentrating 0  0 0 0  Moving slowly or fidgety/restless 0  0 0 0  Suicidal thoughts 0  0 0 0  PHQ-9 Score 0  0 2 0  Difficult doing work/chores Not difficult at all          Fall Risk:    11/18/2022    7:50 AM 09/04/2022    8:26 AM 06/01/2022   10:48 AM 01/30/2022   10:49 AM 09/29/2021   11:02 AM  Fall Risk   Falls in the past  year? 0 0 0 0 0  Number falls in past yr: 0 0  0 0  Injury with Fall? 0 0  0 0  Risk for fall due to : No Fall Risks No Fall Risks No Fall Risks Impaired balance/gait Impaired balance/gait  Follow up Falls prevention discussed;Falls evaluation completed;Education provided Education provided;Falls prevention discussed Falls prevention discussed;Education provided;Falls evaluation completed Falls prevention discussed Falls prevention discussed      Functional Status Survey: Is the patient deaf or have difficulty hearing?: No Does the patient have difficulty seeing, even when wearing glasses/contacts?: No Does the patient have difficulty concentrating, remembering, or making decisions?: No Does the patient have difficulty walking or climbing stairs?: Yes Does the patient have difficulty dressing or bathing?: No Does the patient have difficulty doing errands alone such as visiting a doctor's office or shopping?: No    Assessment & Plan  Problem List Items Addressed This Visit       Cardiovascular and Mediastinum   Essential hypertension    Chronic, historic condition BP initially elevated in office today but on recheck has decreased closer to goal Still elevated at next follow-up visit recommend increasing amlodipine to 5 mg p.o. daily. For now continue current regimen of amlodipine 2.5 mg p.o. daily, losartan 50 mg p.o. daily Recommend that she checks BP at home and keeps log to review at follow-up appointment Recheck labs today 6 months of refills sent to pharmacy on file Follow-up in 6 months or sooner if concerns arise      Relevant Medications  amLODipine (NORVASC) 2.5 MG tablet   atorvastatin (LIPITOR) 40 MG tablet   losartan (COZAAR) 50 MG tablet   Other Relevant Orders   COMPLETE METABOLIC PANEL WITH GFR   CBC w/Diff/Platelet     Digestive   GERD without esophagitis    Chronic, historic condition She is currently taking Protonix 40 mg p.o. daily and appears to be  tolerating well She did not disclose any concerns for heartburn or GI issues today Continue current regimen  follow-up in 6 months or sooner if concerns arise      Relevant Medications   pantoprazole (PROTONIX) 40 MG tablet     Endocrine   Diabetes mellitus type 2, diet-controlled (HCC) - Primary    Chronic, historic condition Most recent A1c was 6.0. Recheck today. Results to dictate further management  She is not currently taking any medications and appears to be managing with lifestyle changes.  Continue with current regimen pending A1c results Follow-up in 6 months or sooner if concerns arise      Relevant Medications   atorvastatin (LIPITOR) 40 MG tablet   losartan (COZAAR) 50 MG tablet   Other Relevant Orders   HgB A1c   Dyslipidemia associated with type 2 diabetes mellitus (HCC)    Chronic, historic condition Patient is currently taking atorvastatin 40 mg p.o. daily and appears to be tolerating well Refills provided, continue current regimen Recheck lipid panel today. Results to dictate further management  Follow-up in 6 months or sooner if concerns arise      Relevant Medications   atorvastatin (LIPITOR) 40 MG tablet   losartan (COZAAR) 50 MG tablet   Other Relevant Orders   Lipid Profile     Nervous and Auditory   Peripheral polyneuropathy    Appears chronic, ongoing Patient request refills for gabapentin-refill sent in today per request Follow-up in 6 months or sooner if concerns arise      Relevant Medications   gabapentin (NEURONTIN) 300 MG capsule     Musculoskeletal and Integument   Cervical disc disease   Relevant Medications   gabapentin (NEURONTIN) 300 MG capsule     Other   B12 deficiency    Recheck labs today Results to dictate further management      Relevant Orders   B12   Other Visit Diagnoses     Need for influenza vaccination       Relevant Orders   Flu Vaccine Trivalent High Dose (Fluad) (Completed)        Return in about  6 months (around 05/19/2023) for DM, HLD, HTN.   I, Flynn Gwyn E Alfonza Toft, PA-C, have reviewed all documentation for this visit. The documentation on 11/18/22 for the exam, diagnosis, procedures, and orders are all accurate and complete.   Jacquelin Hawking, MHS, PA-C [facility]

## 2022-11-18 NOTE — Assessment & Plan Note (Signed)
Chronic, historic condition Patient is currently taking atorvastatin 40 mg p.o. daily and appears to be tolerating well Refills provided, continue current regimen Recheck lipid panel today. Results to dictate further management  Follow-up in 6 months or sooner if concerns arise

## 2022-11-18 NOTE — Assessment & Plan Note (Signed)
Chronic, historic condition BP initially elevated in office today but on recheck has decreased closer to goal Still elevated at next follow-up visit recommend increasing amlodipine to 5 mg p.o. daily. For now continue current regimen of amlodipine 2.5 mg p.o. daily, losartan 50 mg p.o. daily Recommend that she checks BP at home and keeps log to review at follow-up appointment Recheck labs today 6 months of refills sent to pharmacy on file Follow-up in 6 months or sooner if concerns arise

## 2022-11-18 NOTE — Assessment & Plan Note (Signed)
Chronic, historic condition Most recent A1c was 6.0. Recheck today. Results to dictate further management  She is not currently taking any medications and appears to be managing with lifestyle changes.  Continue with current regimen pending A1c results Follow-up in 6 months or sooner if concerns arise

## 2022-11-18 NOTE — Assessment & Plan Note (Signed)
Recheck labs today  Results to dictate further management

## 2022-11-18 NOTE — Assessment & Plan Note (Signed)
Chronic, historic condition She is currently taking Protonix 40 mg p.o. daily and appears to be tolerating well She did not disclose any concerns for heartburn or GI issues today Continue current regimen  follow-up in 6 months or sooner if concerns arise

## 2022-11-18 NOTE — Assessment & Plan Note (Signed)
Appears chronic, ongoing Patient request refills for gabapentin-refill sent in today per request Follow-up in 6 months or sooner if concerns arise

## 2022-11-19 LAB — CBC WITH DIFFERENTIAL/PLATELET
Absolute Monocytes: 635 {cells}/uL (ref 200–950)
Basophils Absolute: 26 {cells}/uL (ref 0–200)
Basophils Relative: 0.3 %
Eosinophils Absolute: 78 {cells}/uL (ref 15–500)
Eosinophils Relative: 0.9 %
HCT: 43.6 % (ref 35.0–45.0)
Hemoglobin: 13.8 g/dL (ref 11.7–15.5)
Lymphs Abs: 1488 {cells}/uL (ref 850–3900)
MCH: 28 pg (ref 27.0–33.0)
MCHC: 31.7 g/dL — ABNORMAL LOW (ref 32.0–36.0)
MCV: 88.6 fL (ref 80.0–100.0)
MPV: 10.7 fL (ref 7.5–12.5)
Monocytes Relative: 7.3 %
Neutro Abs: 6473 {cells}/uL (ref 1500–7800)
Neutrophils Relative %: 74.4 %
Platelets: 293 10*3/uL (ref 140–400)
RBC: 4.92 10*6/uL (ref 3.80–5.10)
RDW: 12.9 % (ref 11.0–15.0)
Total Lymphocyte: 17.1 %
WBC: 8.7 10*3/uL (ref 3.8–10.8)

## 2022-11-19 LAB — COMPLETE METABOLIC PANEL WITH GFR
AG Ratio: 1.9 (calc) (ref 1.0–2.5)
ALT: 19 U/L (ref 6–29)
AST: 19 U/L (ref 10–35)
Albumin: 3.9 g/dL (ref 3.6–5.1)
Alkaline phosphatase (APISO): 154 U/L — ABNORMAL HIGH (ref 37–153)
BUN: 14 mg/dL (ref 7–25)
CO2: 30 mmol/L (ref 20–32)
Calcium: 9 mg/dL (ref 8.6–10.4)
Chloride: 105 mmol/L (ref 98–110)
Creat: 0.71 mg/dL (ref 0.60–0.95)
Globulin: 2.1 g/dL (ref 1.9–3.7)
Glucose, Bld: 96 mg/dL (ref 65–99)
Potassium: 4 mmol/L (ref 3.5–5.3)
Sodium: 143 mmol/L (ref 135–146)
Total Bilirubin: 0.7 mg/dL (ref 0.2–1.2)
Total Protein: 6 g/dL — ABNORMAL LOW (ref 6.1–8.1)
eGFR: 84 mL/min/{1.73_m2} (ref >=60)

## 2022-11-19 LAB — LIPID PANEL
Cholesterol: 104 mg/dL (ref <200)
HDL: 45 mg/dL — ABNORMAL LOW (ref >=50)
LDL Cholesterol (Calc): 47 mg/dL
Non-HDL Cholesterol (Calc): 59 mg/dL (ref <130)
Total CHOL/HDL Ratio: 2.3 (calc) (ref <5.0)
Triglycerides: 43 mg/dL (ref <150)

## 2022-11-19 LAB — HEMOGLOBIN A1C
Hgb A1c MFr Bld: 6.2 %{Hb} — ABNORMAL HIGH (ref <5.7)
Mean Plasma Glucose: 131 mg/dL
eAG (mmol/L): 7.3 mmol/L

## 2022-11-19 LAB — VITAMIN B12: Vitamin B-12: 902 pg/mL (ref 200–1100)

## 2022-11-19 NOTE — Progress Notes (Signed)
Your lab results are back Your A1c is 6.2 which is still within goal range.  Please continue your current regimen. Your cholesterol looks great Your electrolytes, liver and kidney function were overall normal at this time Your CBC is overall normal no signs of anemia Your vitamin B12 is in normal range Please call our office if you have further questions or concerns

## 2022-11-24 DIAGNOSIS — M79672 Pain in left foot: Secondary | ICD-10-CM | POA: Diagnosis not present

## 2022-11-24 DIAGNOSIS — W57XXXA Bitten or stung by nonvenomous insect and other nonvenomous arthropods, initial encounter: Secondary | ICD-10-CM | POA: Diagnosis not present

## 2022-11-24 DIAGNOSIS — I10 Essential (primary) hypertension: Secondary | ICD-10-CM | POA: Diagnosis not present

## 2022-11-24 DIAGNOSIS — M25551 Pain in right hip: Secondary | ICD-10-CM | POA: Diagnosis not present

## 2022-11-24 DIAGNOSIS — M19072 Primary osteoarthritis, left ankle and foot: Secondary | ICD-10-CM | POA: Diagnosis not present

## 2022-11-24 DIAGNOSIS — R1030 Lower abdominal pain, unspecified: Secondary | ICD-10-CM | POA: Diagnosis not present

## 2022-11-24 DIAGNOSIS — M7752 Other enthesopathy of left foot: Secondary | ICD-10-CM | POA: Diagnosis not present

## 2022-11-24 DIAGNOSIS — S90561A Insect bite (nonvenomous), right ankle, initial encounter: Secondary | ICD-10-CM | POA: Diagnosis not present

## 2022-11-24 DIAGNOSIS — M85872 Other specified disorders of bone density and structure, left ankle and foot: Secondary | ICD-10-CM | POA: Diagnosis not present

## 2022-11-24 DIAGNOSIS — M1611 Unilateral primary osteoarthritis, right hip: Secondary | ICD-10-CM | POA: Diagnosis not present

## 2022-11-30 DIAGNOSIS — Z8673 Personal history of transient ischemic attack (TIA), and cerebral infarction without residual deficits: Secondary | ICD-10-CM | POA: Diagnosis not present

## 2022-11-30 DIAGNOSIS — R600 Localized edema: Secondary | ICD-10-CM | POA: Diagnosis not present

## 2022-11-30 DIAGNOSIS — I498 Other specified cardiac arrhythmias: Secondary | ICD-10-CM | POA: Diagnosis not present

## 2022-11-30 DIAGNOSIS — I499 Cardiac arrhythmia, unspecified: Secondary | ICD-10-CM | POA: Diagnosis not present

## 2022-11-30 DIAGNOSIS — R0789 Other chest pain: Secondary | ICD-10-CM | POA: Diagnosis not present

## 2022-11-30 DIAGNOSIS — E119 Type 2 diabetes mellitus without complications: Secondary | ICD-10-CM | POA: Diagnosis not present

## 2022-11-30 DIAGNOSIS — G4733 Obstructive sleep apnea (adult) (pediatric): Secondary | ICD-10-CM | POA: Diagnosis not present

## 2022-11-30 DIAGNOSIS — K219 Gastro-esophageal reflux disease without esophagitis: Secondary | ICD-10-CM | POA: Diagnosis not present

## 2022-11-30 DIAGNOSIS — I10 Essential (primary) hypertension: Secondary | ICD-10-CM | POA: Diagnosis not present

## 2022-11-30 DIAGNOSIS — E782 Mixed hyperlipidemia: Secondary | ICD-10-CM | POA: Diagnosis not present

## 2022-12-09 DIAGNOSIS — M5442 Lumbago with sciatica, left side: Secondary | ICD-10-CM | POA: Diagnosis not present

## 2022-12-09 DIAGNOSIS — M5441 Lumbago with sciatica, right side: Secondary | ICD-10-CM | POA: Diagnosis not present

## 2022-12-09 DIAGNOSIS — G8929 Other chronic pain: Secondary | ICD-10-CM | POA: Diagnosis not present

## 2022-12-25 DIAGNOSIS — R600 Localized edema: Secondary | ICD-10-CM | POA: Diagnosis not present

## 2022-12-25 DIAGNOSIS — R55 Syncope and collapse: Secondary | ICD-10-CM | POA: Diagnosis not present

## 2022-12-28 ENCOUNTER — Telehealth: Payer: Self-pay | Admitting: Family Medicine

## 2022-12-28 NOTE — Telephone Encounter (Signed)
Pt called in about getting compound cream med, says she can only get it [facility], she says PA, Paich, Waldo Laine  was supposed to call this in and her son went to pick it up and its over 100.00 , she says it was supposed to only be 42.00. Please cb

## 2022-12-29 DIAGNOSIS — M898X5 Other specified disorders of bone, thigh: Secondary | ICD-10-CM | POA: Diagnosis not present

## 2022-12-29 DIAGNOSIS — M4807 Spinal stenosis, lumbosacral region: Secondary | ICD-10-CM | POA: Diagnosis not present

## 2022-12-29 DIAGNOSIS — M48062 Spinal stenosis, lumbar region with neurogenic claudication: Secondary | ICD-10-CM | POA: Diagnosis not present

## 2022-12-29 NOTE — Telephone Encounter (Signed)
Called and lvm

## 2022-12-30 ENCOUNTER — Other Ambulatory Visit: Payer: Self-pay | Admitting: Physician Assistant

## 2022-12-30 DIAGNOSIS — M898X5 Other specified disorders of bone, thigh: Secondary | ICD-10-CM

## 2022-12-30 DIAGNOSIS — M48062 Spinal stenosis, lumbar region with neurogenic claudication: Secondary | ICD-10-CM

## 2023-01-15 ENCOUNTER — Other Ambulatory Visit: Payer: Self-pay | Admitting: Family Medicine

## 2023-01-15 DIAGNOSIS — I10 Essential (primary) hypertension: Secondary | ICD-10-CM

## 2023-01-15 NOTE — Telephone Encounter (Signed)
Medication Refill -  Most Recent Primary Care Visit:  Provider: Providence Crosby  Department: [facility]  Visit Type: OFFICE VISIT  Date: 11/18/2022  Medication: losartan (COZAAR) 50 MG tablet [[id number]] requesting 90 day supply   Has the patient contacted their pharmacy? Yes  (Agent: If yes, when and what did the pharmacy advise?) Pharmacy Contacted   Is this the correct pharmacy for this prescription? Yes  This is the patient's preferred pharmacy:   [id number] - [place], [place] - [address] AT [facility] [address] [place] [address] [place] [address] Phone: [phone] Fax: [phone]   Has the prescription been filled recently? Yes  Is the patient out of the medication? Yes  Has the patient been seen for an appointment in the last year OR does the patient have an upcoming appointment? Yes  Can we respond through MyChart? No  Agent: Please be advised that Rx refills may take up to 3 business days. We ask that you follow-up with your pharmacy.

## 2023-01-18 NOTE — Telephone Encounter (Signed)
Requested Prescriptions  Refused Prescriptions Disp Refills   losartan (COZAAR) 50 MG tablet 90 tablet 1    Sig: Take 1 tablet (50 mg total) by mouth daily.     Cardiovascular:  Angiotensin Receptor Blockers Passed - 01/15/2023  5:46 PM      Passed - Cr in normal range and within 180 days    Creat  Date Value Ref Range Status  11/18/2022 0.71 0.60 - 0.95 mg/dL Final   Creatinine, Urine  Date Value Ref Range Status  06/01/2022 12 (L) 20 - 275 mg/dL Final         Passed - K in normal range and within 180 days    Potassium  Date Value Ref Range Status  11/18/2022 4.0 3.5 - 5.3 mmol/L Final  01/22/2014 3.8 3.5 - 5.1 mmol/L Final         Passed - Patient is not pregnant      Passed - Last BP in normal range    BP Readings from Last 1 Encounters:  11/18/22 138/70         Passed - Valid encounter within last 6 months    Recent Outpatient Visits           2 months ago Diabetes mellitus type 2, diet-controlled (HCC)   [facility] Mecum, Erin E, PA-C   7 months ago Dyslipidemia associated with type 2 diabetes mellitus [facility])   [facility] [place], Danna Hefty, MD   11 months ago Dyslipidemia associated with type 2 diabetes mellitus [facility])   [facility] Alba Cory, MD   1 year ago Dyslipidemia associated with type 2 diabetes mellitus [facility])   [place] [facility] Alba Cory, MD   1 year ago Acute cough   [facility] Berniece Salines, FNP       Future Appointments             In 4 months Alba Cory, MD [facility], [facility]

## 2023-01-25 ENCOUNTER — Ambulatory Visit
Admission: RE | Admit: 2023-01-25 | Discharge: 2023-01-25 | Disposition: A | Discharge status: Home / Self Care | Payer: Medicare Other | Source: Ambulatory Visit | Attending: Physician Assistant | Admitting: Physician Assistant

## 2023-01-25 DIAGNOSIS — M48061 Spinal stenosis, lumbar region without neurogenic claudication: Secondary | ICD-10-CM | POA: Diagnosis not present

## 2023-01-25 DIAGNOSIS — M47816 Spondylosis without myelopathy or radiculopathy, lumbar region: Secondary | ICD-10-CM | POA: Diagnosis not present

## 2023-01-25 DIAGNOSIS — M48062 Spinal stenosis, lumbar region with neurogenic claudication: Secondary | ICD-10-CM

## 2023-01-25 DIAGNOSIS — M898X5 Other specified disorders of bone, thigh: Secondary | ICD-10-CM

## 2023-02-24 DIAGNOSIS — M48062 Spinal stenosis, lumbar region with neurogenic claudication: Secondary | ICD-10-CM | POA: Diagnosis not present

## 2023-02-24 DIAGNOSIS — M5416 Radiculopathy, lumbar region: Secondary | ICD-10-CM | POA: Diagnosis not present

## 2023-03-19 DIAGNOSIS — M48062 Spinal stenosis, lumbar region with neurogenic claudication: Secondary | ICD-10-CM | POA: Diagnosis not present

## 2023-03-19 DIAGNOSIS — M5416 Radiculopathy, lumbar region: Secondary | ICD-10-CM | POA: Diagnosis not present

## 2023-04-05 ENCOUNTER — Other Ambulatory Visit: Payer: Self-pay

## 2023-04-05 ENCOUNTER — Emergency Department: Payer: Medicare Other

## 2023-04-05 ENCOUNTER — Emergency Department
Admission: EM | Admit: 2023-04-05 | Discharge: 2023-04-05 | Disposition: A | Discharge status: Home / Self Care | Payer: Medicare Other | Attending: Emergency Medicine | Admitting: Emergency Medicine

## 2023-04-05 DIAGNOSIS — E119 Type 2 diabetes mellitus without complications: Secondary | ICD-10-CM | POA: Diagnosis not present

## 2023-04-05 DIAGNOSIS — R519 Headache, unspecified: Secondary | ICD-10-CM | POA: Diagnosis not present

## 2023-04-05 DIAGNOSIS — R55 Syncope and collapse: Secondary | ICD-10-CM | POA: Insufficient documentation

## 2023-04-05 DIAGNOSIS — I10 Essential (primary) hypertension: Secondary | ICD-10-CM | POA: Insufficient documentation

## 2023-04-05 DIAGNOSIS — G44219 Episodic tension-type headache, not intractable: Secondary | ICD-10-CM | POA: Insufficient documentation

## 2023-04-05 DIAGNOSIS — I6782 Cerebral ischemia: Secondary | ICD-10-CM | POA: Diagnosis not present

## 2023-04-05 LAB — CBC WITH DIFFERENTIAL/PLATELET
Abs Immature Granulocytes: 0.01 10*3/uL (ref 0.00–0.07)
Basophils Absolute: 0.1 10*3/uL (ref 0.0–0.1)
Basophils Relative: 1 %
Eosinophils Absolute: 0.1 10*3/uL (ref 0.0–0.5)
Eosinophils Relative: 2 %
HCT: 44.3 % (ref 36.0–46.0)
Hemoglobin: 14 g/dL (ref 12.0–15.0)
Immature Granulocytes: 0 %
Lymphocytes Relative: 31 %
Lymphs Abs: 2.2 10*3/uL (ref 0.7–4.0)
MCH: 28 pg (ref 26.0–34.0)
MCHC: 31.6 g/dL (ref 30.0–36.0)
MCV: 88.6 fL (ref 80.0–100.0)
Monocytes Absolute: 0.5 10*3/uL (ref 0.1–1.0)
Monocytes Relative: 7 %
Neutro Abs: 4.3 10*3/uL (ref 1.7–7.7)
Neutrophils Relative %: 59 %
Platelets: 311 10*3/uL (ref 150–400)
RBC: 5 MIL/uL (ref 3.87–5.11)
RDW: 14.9 % (ref 11.5–15.5)
WBC: 7.2 10*3/uL (ref 4.0–10.5)
nRBC: 0 % (ref 0.0–0.2)

## 2023-04-05 LAB — COMPREHENSIVE METABOLIC PANEL
ALT: 21 U/L (ref 0–44)
AST: 22 U/L (ref 15–41)
Albumin: 3.8 g/dL (ref 3.5–5.0)
Alkaline Phosphatase: 122 U/L (ref 38–126)
Anion gap: 10 (ref 5–15)
BUN: 16 mg/dL (ref 8–23)
CO2: 28 mmol/L (ref 22–32)
Calcium: 8.8 mg/dL — ABNORMAL LOW (ref 8.9–10.3)
Chloride: 104 mmol/L (ref 98–111)
Creatinine, Ser: 0.77 mg/dL (ref 0.44–1.00)
GFR, Estimated: >60 mL/min (ref >60)
Glucose, Bld: 106 mg/dL — ABNORMAL HIGH (ref 70–99)
Potassium: 3.8 mmol/L (ref 3.5–5.1)
Sodium: 142 mmol/L (ref 135–145)
Total Bilirubin: 1 mg/dL (ref 0.0–1.2)
Total Protein: 6.9 g/dL (ref 6.5–8.1)

## 2023-04-05 MED ORDER — GABAPENTIN 300 MG PO CAPS: ORAL_CAPSULE | ORAL | 0 refills | Status: DC

## 2023-04-05 NOTE — ED Triage Notes (Signed)
 Patient states intermittent headaches for 2 weeks after stopping Gabapentin. Also states on Sunday she had an episode of dizziness, vomiting and possible LOC; wants to know if she's had a stroke or seizure.

## 2023-04-05 NOTE — ED Notes (Signed)
 Pt states not sure if she had stroke. Pt states he gets hot flash come and she gets sweaty and passed out while sitting at the table.  Pt also says he neighbor called and she could hear her talking and pt couldn't say anything a few days ago.

## 2023-04-05 NOTE — Discharge Instructions (Addendum)
 Follow-up with your primary care provider.  Return to the ER for new, worsening, or persistent episodes of weakness, lightheadedness, feel like you are going to pass out or actually passing out, severe headache, vomiting, chest pain, or any other new or worsening symptoms that concern you.  We have prescribed a 1 month supply of your gabapentin.  Follow-up with your primary care provider or physical medicine doctor for a refill.

## 2023-04-05 NOTE — ED Provider Notes (Signed)
 [facility] Provider Note    Event Date/Time   First MD Initiated Contact with Patient 04/05/23 1702     (approximate)   History   Headache   HPI  Catherine Burgess is a 88 y.o. female with history of hypertension, diabetes, and GERD who presents with intermittent headaches and a near syncopal episode 8 days ago.  The patient states that her primary concern was this episode that occurred last week when she was with her friend after church and with eating.  She started to feel an abnormal sensation going up into her throat and then to her head.  She started to feel lightheaded, vomited up the food that she had eaten, and then felt like she was about to pass out.  Her friend told her that she did seem to pass out.  The patient remembers the friend talking to her but she could not respond.  This lasted for a few minutes and then the patient returned to baseline.  She states that she also ran out of her gabapentin and it was not refilled at her most recent medical visit; she is unsure why.  She states that since that time she has had some intermittent headaches.  The patient denies any vision changes, weakness or numbness, difficulty speaking, difficulty with coordination, chest pain, difficulty breathing, fever, or other acute symptoms.  I reviewed the past medical records.  The patient's most recent outpatient encounter was an initial evaluation by physical medicine on 2/7 for lumbar stenosis and neurogenic claudication.  She was also seen at the walk-in clinic today.  The note indicates that the reason for sending her to the ED is "severe headache, auditory hallucinations, dysphagia, and disorientation."  The patient is not having an active headache at this time.  She has not had any hallucinations.  The symptoms of dysphagia and disorientation happened during the near syncopal episode 8 days ago.   Physical Exam   Triage Vital Signs: ED Triage Vitals  Encounter  Vitals Group     BP 04/05/23 1541 (!) 165/86     Systolic BP Percentile --      Diastolic BP Percentile --      Pulse Rate 04/05/23 1541 86     Resp 04/05/23 1541 18     Temp 04/05/23 1541 98.1 F (36.7 C)     Temp Source 04/05/23 1541 Oral     SpO2 04/05/23 1541 99 %     Weight 04/05/23 1543 135 lb (61.2 kg)     Height 04/05/23 1543 5\' 3"  (1.6 m)     Head Circumference --      Peak Flow --      Pain Score 04/05/23 1542 6     Pain Loc --      Pain Education --      Exclude from Growth Chart --     Most recent vital signs: Vitals:   04/05/23 1830 04/05/23 1900  BP: (!) 158/67 (!) 165/70  Pulse: 69 69  Resp: 17 17  Temp:    SpO2: 100% 100%     General: Alert, well-appearing, no distress.  CV:  Good peripheral perfusion.  Resp:  Normal effort.  Abd:  No distention.  Other:  EOMI.  PERRLA.  No facial droop.  Normal speech.  5/5 motor strength and intact sensation to all extremities.  No ataxia on finger-to-nose.  No pronator drift.   ED Results / Procedures / Treatments   Labs (all labs  ordered are listed, but only abnormal results are displayed) Labs Reviewed  COMPREHENSIVE METABOLIC PANEL - Abnormal; Notable for the following components:      Result Value   Glucose, Bld 106 (*)    Calcium 8.8 (*)    All other components within normal limits  CBC WITH DIFFERENTIAL/PLATELET     EKG  ED ECG REPORT I, Dionne Bucy, the attending physician, personally viewed and interpreted this ECG.  Date: 04/05/2023 EKG Time: 1708 Rate: 69 Rhythm: normal sinus rhythm QRS Axis: normal Intervals: normal ST/T Wave abnormalities: normal Narrative Interpretation: no evidence of acute ischemia    RADIOLOGY  CT head: I independently viewed and interpreted the images; there is no ICH.  Radiology report indicates the following:   IMPRESSION:  1. No acute intracranial process.  2. Moderate chronic small vessel ischemic changes.  3. Left maxillary sinus disease.     PROCEDURES:  Critical Care performed: No  Procedures   MEDICATIONS ORDERED IN ED: Medications - No data to display   IMPRESSION / MDM / ASSESSMENT AND PLAN / ED COURSE  I reviewed the triage vital signs and the nursing notes.  88 year old female with PMH as noted above presents with a primary concern of being evaluated for an episode that occurred 8 days ago with vomiting and near syncope after eating.  She has had some increased headaches as well after running out of gabapentin.  On exam she is slightly hypertensive but overall well-appearing.  Thorough neurologic exam is normal.  Differential diagnosis includes, but is not limited to, vasovagal episode, dehydration/hypovolemia, less likely TIA or other acute CNS etiology.  There is no clinical evidence for cardiac cause.  EKG shows no acute abnormalities.  The patient has not had any recurrent near syncopal symptoms over the last week.  Therefore there is no indication for further cardiac workup.  We will obtain basic labs, CT head, and reassess.  Patient's presentation is most consistent with acute complicated illness / injury requiring diagnostic workup.  The patient is on the cardiac monitor to evaluate for evidence of arrhythmia and/or significant heart rate changes.  ----------------------------------------- 7:45 PM on 04/05/2023 -----------------------------------------  CT head is negative.  The patient remains asymptomatic on reassessment.  She feels well and would like to go home.  She is appropriate for discharge at this time.  I counseled her on the results of the workup.  I advised her to follow-up with her primary care provider.  I reviewed her recent note from PM&R from 2/7 at which time the plan was to continue her gabapentin.  It is unclear why the prescription ran out.  I have prescribed a 1 month supply of her current dosage to cover her until she can follow-up with one of her providers.  I gave strict return  precautions and she expressed understanding.  FINAL CLINICAL IMPRESSION(S) / ED DIAGNOSES   Final diagnoses:  Near syncope  Nonintractable episodic headache, unspecified headache type     Rx / DC Orders   ED Discharge Orders          Ordered    gabapentin (NEURONTIN) 300 MG capsule  Multiple Frequencies        04/05/23 1944             Note:  This document was prepared using Dragon voice recognition software and may include unintentional dictation errors.    Dionne Bucy, MD 04/05/23 1946

## 2023-04-12 ENCOUNTER — Encounter: Payer: Self-pay | Admitting: Family Medicine

## 2023-04-12 ENCOUNTER — Ambulatory Visit (INDEPENDENT_AMBULATORY_CARE_PROVIDER_SITE_OTHER): Payer: Medicare Other | Admitting: Family Medicine

## 2023-04-12 VITALS — BP 144/64 | HR 78 | Resp 16 | Ht 63.0 in | Wt 137.7 lb

## 2023-04-12 DIAGNOSIS — I10 Essential (primary) hypertension: Secondary | ICD-10-CM | POA: Diagnosis not present

## 2023-04-12 DIAGNOSIS — R937 Abnormal findings on diagnostic imaging of other parts of musculoskeletal system: Secondary | ICD-10-CM

## 2023-04-12 DIAGNOSIS — Z87898 Personal history of other specified conditions: Secondary | ICD-10-CM

## 2023-04-12 DIAGNOSIS — I679 Cerebrovascular disease, unspecified: Secondary | ICD-10-CM | POA: Diagnosis not present

## 2023-04-12 NOTE — Progress Notes (Signed)
 Name: Catherine Burgess   MRN: [id number]    DOB: 02-Nov-1935   Date:04/12/2023       Progress Note  Subjective  Chief Complaint  Chief Complaint  Patient presents with   Hospitalization Follow-up    Pt feeling better no more syncope episodes since she left the hospital   HPI   Discussed the use of AI scribe software for clinical note transcription with the patient, who gave verbal consent to proceed.  History of Present Illness   Catherine Burgess is an 88 year old female with syncope and spinal stenosis who presents with recent episodes of syncope and headache.  She experienced a recent episode of syncope on February 8  characterized by a 'weird sensation' and feeling 'real hot' while eating with a friend. This was followed by vomiting and loss of consciousness for approximately two to three minutes. She did not seek immediate medical attention but went to the emergency room on Feb 24 th 2025 later due to persistent frontal headache and a sensation of elevated blood pressure. She has a history of syncope, with episodes occurring sporadically, sometimes related to eating certain foods like pork or berries. During these episodes, she experiences a sensation of getting 'real hot' and sweating, followed by syncope. She has not previously discussed these episodes with her neurologist, whom she sees regularly for neuropathy. No recent stroke or seizure activity was reported, and there is no bowel or bladder incontinence during syncope episodes.  She went to [facility] on Feb 24 th because of headache that started after the syncopal espidoes . At the emergency room, her blood pressure was noted to be elevated. She was also found to have run out of gabapentin, which she had been taking for spinal stenosis-related pain. EC physician sent refill of gabapentin and it has helped with symptoms of headache and back pain.  She has a history of spinal stenosis, which sometimes causes back pain radiating down her leg.  She had been taking gabapentin 300 mg, one in the morning and two at night, but had been without it for about a week before her emergency room visit. A recent MRI showed a small foci at T12 , which was suggested to be a hemangioma.  MRI of the brain indicated sinus inflammation of left maxillary sinus. MRI of the brain showed small vessel disease, which is consistent with her age.          Patient Active Problem List   Diagnosis Date Noted   B12 deficiency 06/01/2022   Peripheral polyneuropathy 06/01/2022   Dyslipidemia associated with type 2 diabetes mellitus (HCC) 06/01/2022   PAD (peripheral artery disease) (HCC) 01/30/2022   Diabetes mellitus type 2, diet-controlled (HCC) 05/11/2021   Altered bowel habits    Abnormal ankle brachial index (ABI) 03/02/2018   Calcified cerebral meningioma (HCC) 10/04/2017   History of CVA (cerebrovascular accident) without residual deficits 10/04/2017   Subacromial impingement of right shoulder 10/04/2017   History of anemia 10/13/2016   Abnormal CT of brain 07/23/2016   Vitamin D deficiency 10/28/2015   Post menopausal syndrome 10/28/2015   Osteopenia of multiple sites 10/28/2015   Cervical disc disease 10/28/2015   H/O total knee replacement 01/20/2015   Vaginal atrophy 09/26/2014   Osteoarthritis of both knees 07/31/2014   GERD without esophagitis 07/31/2014   HLD (hyperlipidemia) 07/31/2014   Essential hypertension 07/31/2014    Past Surgical History:  Procedure Laterality Date   CATARACT EXTRACTION, BILATERAL     COLONOSCOPY WITH  PROPOFOL N/A 06/20/2020   Procedure: COLONOSCOPY WITH PROPOFOL;  Surgeon: Toney Reil, MD;  Location: [facility] ENDOSCOPY;  Service: Gastroenterology;  Laterality: N/A;   EYE SURGERY     HIP PINNING Left 1956   JOINT REPLACEMENT     REPLACEMENT TOTAL KNEE Left 11/12/2009   TOTAL KNEE REVISION Left 12/07/2016   Procedure: TOTAL KNEE REVISION;  Surgeon: Donato Heinz, MD;  Location: [facility] ORS;  Service:  Orthopedics;  Laterality: Left;    Family History  Problem Relation Age of Onset   Emphysema Father        Smoker   CAD Mother    Stroke Mother    Diabetes Brother    Seizures Brother    Cancer Brother        unknown   Diabetes Brother     Social History   Tobacco Use   Smoking status: Former    Current packs/day: 0.00    Average packs/day: 0.3 packs/day for 1 year (0.3 ttl pk-yrs)    Types: Cigarettes    Start date: 89    Quit date: 1966    Years since quitting: 59.2   Smokeless tobacco: Never   Tobacco comments:    smoking cessation materials not required  Substance Use Topics   Alcohol use: No    Alcohol/week: 0.0 standard drinks of alcohol     Current Outpatient Medications:    acetaminophen (TYLENOL) 500 MG tablet, Take 1 tablet (500 mg total) by mouth 2 (two) times daily. (Patient taking differently: Take 500 mg by mouth every 6 (six) hours as needed for mild pain (pain score 1-3).), Disp: 180 tablet, Rfl: 0   amLODipine (NORVASC) 2.5 MG tablet, Take 1 tablet (2.5 mg total) by mouth daily., Disp: 90 tablet, Rfl: 1   aspirin EC 81 MG tablet, Take 81 mg by mouth., Disp: , Rfl:    atorvastatin (LIPITOR) 40 MG tablet, Take 1 tablet (40 mg total) by mouth daily. TAKE 1 TABLET(40 MG) BY MOUTH DAILY, Disp: 90 tablet, Rfl: 1   baclofen (LIORESAL) 20 MG tablet, Take 1 tablet (20 mg total) by mouth at bedtime as needed for muscle spasms., Disp: 90 each, Rfl: 0   Calcium-Vitamin D 600-200 MG-UNIT tablet, Take 1 tablet by mouth daily. , Disp: , Rfl:    fluticasone (FLONASE) 50 MCG/ACT nasal spray, Place into the nose., Disp: , Rfl:    gabapentin (NEURONTIN) 300 MG capsule, Take 1 capsule (300 mg total) by mouth every morning AND 2 capsules (600 mg total) at bedtime., Disp: 90 capsule, Rfl: 0   losartan (COZAAR) 50 MG tablet, TAKE 1 TABLET(50 MG) BY MOUTH DAILY, Disp: 90 tablet, Rfl: 0   pantoprazole (PROTONIX) 40 MG tablet, Take 1 tablet (40 mg total) by mouth daily., Disp:  90 tablet, Rfl: 1   tiZANidine (ZANAFLEX) 2 MG tablet, Take 2 mg by mouth 3 (three) times daily. PRN, Disp: , Rfl:   Allergies  Allergen Reactions   Lovastatin Other (See Comments)    chest   Pravastatin     Other reaction(s): Muscle Pain chest   Rosuvastatin     Other reaction(s): Muscle Pain chest Other reaction(s): Muscle Pain chest   Statins     Other reaction(s): Muscle Pain chest    I personally reviewed active problem list, medication list, allergies with the patient/caregiver today.   ROS  Ten systems reviewed and is negative except as mentioned in HPI   Objective  Vitals:   04/12/23 1316 04/12/23 1357  BP: (!) 148/80 (!) 144/64  Pulse: 78   Resp: 16   SpO2: 97%   Weight: 137 lb 11.2 oz (62.5 kg)   Height: 5\' 3"  (1.6 m)     Body mass index is 24.39 kg/m.  Physical Exam  Constitutional: Patient appears well-developed and well-nourished.  No distress.  HEENT: head atraumatic, normocephalic, pupils equal and reactive to light, ears normal TM, neck supple Cardiovascular: Normal rate, regular rhythm and normal heart sounds.  No murmur heard. No BLE edema. Pulmonary/Chest: Effort normal and breath sounds normal. No respiratory distress. Abdominal: Soft.  There is no tenderness. Muscular skeletal: using a cane due to chronic low back pain Psychiatric: Patient has a normal mood and affect. behavior is normal. Judgment and thought content normal.   Recent Results (from the past 2160 hours)  CBC with Differential     Status: None   Collection Time: 04/05/23  3:44 PM  Result Value Ref Range   WBC 7.2 4.0 - 10.5 K/uL   RBC 5.00 3.87 - 5.11 MIL/uL   Hemoglobin 14.0 12.0 - 15.0 g/dL   HCT 25.9 56.3 - 87.5 %   MCV 88.6 80.0 - 100.0 fL   MCH 28.0 26.0 - 34.0 pg   MCHC 31.6 30.0 - 36.0 g/dL   RDW 64.3 32.9 - 51.8 %   Platelets 311 150 - 400 K/uL   nRBC 0.0 0.0 - 0.2 %   Neutrophils Relative % 59 %   Neutro Abs 4.3 1.7 - 7.7 K/uL   Lymphocytes Relative 31 %    Lymphs Abs 2.2 0.7 - 4.0 K/uL   Monocytes Relative 7 %   Monocytes Absolute 0.5 0.1 - 1.0 K/uL   Eosinophils Relative 2 %   Eosinophils Absolute 0.1 0.0 - 0.5 K/uL   Basophils Relative 1 %   Basophils Absolute 0.1 0.0 - 0.1 K/uL   Immature Granulocytes 0 %   Abs Immature Granulocytes 0.01 0.00 - 0.07 K/uL    Comment: Performed at [facility], [address] [place], [place] [address]  Comprehensive metabolic panel     Status: Abnormal   Collection Time: 04/05/23  3:44 PM  Result Value Ref Range   Sodium 142 135 - 145 mmol/L   Potassium 3.8 3.5 - 5.1 mmol/L   Chloride 104 98 - 111 mmol/L   CO2 28 22 - 32 mmol/L   Glucose, Bld 106 (H) 70 - 99 mg/dL    Comment: Glucose reference range applies only to samples taken after fasting for at least 8 hours.   BUN 16 8 - 23 mg/dL   Creatinine, Ser 0.63 0.44 - 1.00 mg/dL   Calcium 8.8 (L) 8.9 - 10.3 mg/dL   Total Protein 6.9 6.5 - 8.1 g/dL   Albumin 3.8 3.5 - 5.0 g/dL   AST 22 15 - 41 U/L   ALT 21 0 - 44 U/L   Alkaline Phosphatase 122 38 - 126 U/L   Total Bilirubin 1.0 0.0 - 1.2 mg/dL   GFR, Estimated >01 >60 mL/min    Comment: (NOTE) Calculated using the CKD-EPI Creatinine Equation (2021)    Anion gap 10 5 - 15    Comment: Performed at [facility], [address] [place], [place] [address]    Diabetic Foot Exam:     PHQ2/9:    04/12/2023    1:20 PM 11/18/2022    7:50 AM 09/04/2022    8:33 AM 06/01/2022   10:48 AM 01/30/2022   10:50 AM  Depression screen PHQ 2/9  Decreased Interest 0 0 0 0 0  Down, Depressed, Hopeless 0 0 0 0 0  PHQ - 2 Score 0 0 0 0 0  Altered sleeping 0 0  0 0  Tired, decreased energy 0 0  0 0  Change in appetite 0 0  0 2  Feeling bad or failure about yourself  0 0  0 0  Trouble concentrating 0 0  0 0  Moving slowly or fidgety/restless 0 0  0 0  Suicidal thoughts 0 0  0 0  PHQ-9 Score 0 0  0 2  Difficult doing work/chores Not difficult at all Not difficult at all       phq 9 is  negative  Fall Risk:    11/18/2022    7:50 AM 09/04/2022    8:26 AM 06/01/2022   10:48 AM 01/30/2022   10:49 AM 09/29/2021   11:02 AM  Fall Risk   Falls in the past year? 0 0 0 0 0  Number falls in past yr: 0 0  0 0  Injury with Fall? 0 0  0 0  Risk for fall due to : No Fall Risks No Fall Risks No Fall Risks Impaired balance/gait Impaired balance/gait  Follow up Falls prevention discussed;Falls evaluation completed;Education provided Education provided;Falls prevention discussed Falls prevention discussed;Education provided;Falls evaluation completed Falls prevention discussed Falls prevention discussed     Assessment and Plan    Syncope Recent episode of syncope associated with nausea and vomiting. No bowel or bladder incontinence. No recent similar episodes. Patient has a history of stroke. -Discuss episodes with neurologist at next visit.  Headache Recent frontal headache associated with feeling of elevated blood pressure. Resolved at time of visit. -Continue current management.  Spinal Stenosis Chronic back and leg pain managed with Gabapentin 300mg  (1 in the morning, 2 at night). Recent interruption in medication supply. -Continue Gabapentin as prescribed.  Abnormal Lumbar Spine MRI Small foci T12  likely hemangioma. Pain is chronic and stable -Plan to recheck MRI in 6 months to a year.  Hypertension Blood pressure slightly elevated at recent ER visit. Today's reading 148/80 and improved a little down to SBP 144   Small Vessel Disease Known history, managed with diabetes control, cholesterol medication, and baby aspirin. -Continue current management.  Follow-up Regular follow-up scheduled for April. Plan to do blood work at that time.

## 2023-05-19 ENCOUNTER — Ambulatory Visit: Payer: Medicare Other | Admitting: Family Medicine

## 2023-05-21 ENCOUNTER — Encounter: Payer: Self-pay | Admitting: Family Medicine

## 2023-05-21 ENCOUNTER — Ambulatory Visit: Payer: Self-pay | Admitting: Family Medicine

## 2023-05-21 VITALS — BP 136/74 | HR 68 | Resp 16 | Ht 63.0 in | Wt 136.6 lb

## 2023-05-21 DIAGNOSIS — I10 Essential (primary) hypertension: Secondary | ICD-10-CM

## 2023-05-21 DIAGNOSIS — E1169 Type 2 diabetes mellitus with other specified complication: Secondary | ICD-10-CM | POA: Diagnosis not present

## 2023-05-21 DIAGNOSIS — D320 Benign neoplasm of cerebral meninges: Secondary | ICD-10-CM

## 2023-05-21 DIAGNOSIS — I739 Peripheral vascular disease, unspecified: Secondary | ICD-10-CM

## 2023-05-21 DIAGNOSIS — E785 Hyperlipidemia, unspecified: Secondary | ICD-10-CM

## 2023-05-21 DIAGNOSIS — G629 Polyneuropathy, unspecified: Secondary | ICD-10-CM

## 2023-05-21 DIAGNOSIS — M8589 Other specified disorders of bone density and structure, multiple sites: Secondary | ICD-10-CM

## 2023-05-21 DIAGNOSIS — E538 Deficiency of other specified B group vitamins: Secondary | ICD-10-CM

## 2023-05-21 DIAGNOSIS — Z8673 Personal history of transient ischemic attack (TIA), and cerebral infarction without residual deficits: Secondary | ICD-10-CM

## 2023-05-21 DIAGNOSIS — K219 Gastro-esophageal reflux disease without esophagitis: Secondary | ICD-10-CM

## 2023-05-21 LAB — POCT GLYCOSYLATED HEMOGLOBIN (HGB A1C): Hemoglobin A1C: 6.3 % — AB (ref 4.0–5.6)

## 2023-05-21 MED ORDER — GABAPENTIN 300 MG PO CAPS: ORAL_CAPSULE | ORAL | 1 refills | Status: DC

## 2023-05-21 MED ORDER — TIZANIDINE HCL 2 MG PO TABS: 2.0000 mg | ORAL_TABLET | Freq: Every day | ORAL | 1 refills | Status: DC

## 2023-05-21 MED ORDER — LOSARTAN POTASSIUM 50 MG PO TABS: 50.0000 mg | ORAL_TABLET | Freq: Every day | ORAL | 1 refills | Status: DC

## 2023-05-21 MED ORDER — AMLODIPINE BESYLATE 2.5 MG PO TABS: 2.5000 mg | ORAL_TABLET | Freq: Every day | ORAL | 1 refills | Status: DC

## 2023-05-21 MED ORDER — PANTOPRAZOLE SODIUM 40 MG PO TBEC: 40.0000 mg | DELAYED_RELEASE_TABLET | Freq: Every day | ORAL | 1 refills | Status: DC

## 2023-05-21 MED ORDER — ATORVASTATIN CALCIUM 40 MG PO TABS
40.0000 mg | ORAL_TABLET | Freq: Every day | ORAL | 1 refills | Status: DC
Start: 2023-05-21 — End: 2023-11-22

## 2023-05-21 NOTE — Progress Notes (Signed)
 Name: Catherine Burgess   MRN: [id number]    DOB: 21-Dec-1935   Date:05/21/2023       Progress Note  Subjective  Chief Complaint  Chief Complaint  Patient presents with   Medical Management of Chronic Issues   HPI   Abnormal MRI brain from 2018 also abnormal CT brain from 06/2017. CT showed calcified meningioma, she denies headaches, dizziness or neuro deficitis.  She had near syncope on 03/2023 and went again to [facility] with lightheaded , nausea and vomiting  and fatigue she could not respond when friend called her name She had a head CT it showed vascular disease and maxillary sinusitis . Advised a sooner visit with neurologist   Neuropathy: seen by neurologist, she takes gabapentin but states down to two capsules at night only,   and tingling and numbness of her hands and feet has improved     NW:GNFAO well at this time. She has flonase that she takes prn    GERD: she is taking Pantoprazole once a day, no heartburn or indigestion now. Appetite is normal    OA/s/p left knee replacement: she has intermittent pain, taking Tylenol prn now, no effusion or redness. She has OA right knee , both shoulders. She also have back pain intermittent and muscle relaxer also prn     Hyperlipidemia: taking medication, denies myalgia, LDL has been at goal at 47  , continue medication    HTN: currently doing well on losartan 50 mg and norvasc 2.5 mg,  BP is at goal  Denies headaches, dizziness or chest pain. She is compliant with medications    Osteopenia: reviewed last bone density 2020, continue high calcium diet , and take vitamin D 2000 units daily , also needs to continue being physically active. Advised to get bone density done but she is not interested at this time , she states she would not take medication for it . Unchanged    Diabetes diet controlled with dyslipidemia : today A1C is 6.3%, she states eating very healthy, she denies polyphagia, polydipsia or polyuria.  She takes statins and ARB.     PAD: failed ABI screen , saw vascular surgeon and was advised to go back in 6 months but lost to follow up, she is willing to go back now     Patient Active Problem List   Diagnosis Date Noted   B12 deficiency 06/01/2022   Peripheral polyneuropathy 06/01/2022   Dyslipidemia associated with type 2 diabetes mellitus (HCC) 06/01/2022   PAD (peripheral artery disease) (HCC) 01/30/2022   Diabetes mellitus type 2, diet-controlled (HCC) 05/11/2021   Altered bowel habits    Abnormal ankle brachial index (ABI) 03/02/2018   Calcified cerebral meningioma (HCC) 10/04/2017   History of CVA (cerebrovascular accident) without residual deficits 10/04/2017   Subacromial impingement of right shoulder 10/04/2017   History of anemia 10/13/2016   Abnormal CT of brain 07/23/2016   Vitamin D deficiency 10/28/2015   Post menopausal syndrome 10/28/2015   Osteopenia of multiple sites 10/28/2015   Cervical disc disease 10/28/2015   H/O total knee replacement 01/20/2015   Vaginal atrophy 09/26/2014   Osteoarthritis of both knees 07/31/2014   GERD without esophagitis 07/31/2014   HLD (hyperlipidemia) 07/31/2014   Essential hypertension 07/31/2014    Past Surgical History:  Procedure Laterality Date   CATARACT EXTRACTION, BILATERAL     COLONOSCOPY WITH PROPOFOL N/A 06/20/2020   Procedure: COLONOSCOPY WITH PROPOFOL;  Surgeon: Toney Reil, MD;  Location: [facility] ENDOSCOPY;  Service: Gastroenterology;  Laterality: N/A;   EYE SURGERY     HIP PINNING Left 1956   JOINT REPLACEMENT     REPLACEMENT TOTAL KNEE Left 11/12/2009   TOTAL KNEE REVISION Left 12/07/2016   Procedure: TOTAL KNEE REVISION;  Surgeon: Donato Heinz, MD;  Location: [facility] ORS;  Service: Orthopedics;  Laterality: Left;    Family History  Problem Relation Age of Onset   Emphysema Father        Smoker   CAD Mother    Stroke Mother    Diabetes Brother    Seizures Brother    Cancer Brother        unknown   Diabetes Brother      Social History   Tobacco Use   Smoking status: Former    Current packs/day: 0.00    Average packs/day: 0.3 packs/day for 1 year (0.3 ttl pk-yrs)    Types: Cigarettes    Start date: 42    Quit date: 1966    Years since quitting: 59.3   Smokeless tobacco: Never   Tobacco comments:    smoking cessation materials not required  Substance Use Topics   Alcohol use: No    Alcohol/week: 0.0 standard drinks of alcohol     Current Outpatient Medications:    acetaminophen (TYLENOL) 500 MG tablet, Take 1 tablet (500 mg total) by mouth 2 (two) times daily. (Patient taking differently: Take 500 mg by mouth every 6 (six) hours as needed for mild pain (pain score 1-3).), Disp: 180 tablet, Rfl: 0   amLODipine (NORVASC) 2.5 MG tablet, Take 1 tablet (2.5 mg total) by mouth daily., Disp: 90 tablet, Rfl: 1   aspirin EC 81 MG tablet, Take 81 mg by mouth., Disp: , Rfl:    atorvastatin (LIPITOR) 40 MG tablet, Take 1 tablet (40 mg total) by mouth daily. TAKE 1 TABLET(40 MG) BY MOUTH DAILY, Disp: 90 tablet, Rfl: 1   baclofen (LIORESAL) 20 MG tablet, Take 1 tablet (20 mg total) by mouth at bedtime as needed for muscle spasms., Disp: 90 each, Rfl: 0   Calcium-Vitamin D 600-200 MG-UNIT tablet, Take 1 tablet by mouth daily. , Disp: , Rfl:    fluticasone (FLONASE) 50 MCG/ACT nasal spray, Place into the nose., Disp: , Rfl:    losartan (COZAAR) 50 MG tablet, TAKE 1 TABLET(50 MG) BY MOUTH DAILY, Disp: 90 tablet, Rfl: 0   pantoprazole (PROTONIX) 40 MG tablet, Take 1 tablet (40 mg total) by mouth daily., Disp: 90 tablet, Rfl: 1   tiZANidine (ZANAFLEX) 2 MG tablet, Take 2 mg by mouth 3 (three) times daily. PRN, Disp: , Rfl:    gabapentin (NEURONTIN) 300 MG capsule, Take 1 capsule (300 mg total) by mouth every morning AND 2 capsules (600 mg total) at bedtime., Disp: 90 capsule, Rfl: 0  Allergies  Allergen Reactions   Lovastatin Other (See Comments)    chest   Pravastatin     Other reaction(s): Muscle  Pain chest   Rosuvastatin     Other reaction(s): Muscle Pain chest Other reaction(s): Muscle Pain chest   Statins     Other reaction(s): Muscle Pain chest    I personally reviewed active problem list, medication list, allergies with the patient/caregiver today.   ROS  Ten systems reviewed and is negative except as mentioned in HPI    Objective Physical Exam Constitutional: Patient appears well-developed and well-nourished. Obese  No distress.  HEENT: head atraumatic, normocephalic, pupils equal and reactive to light, neck supple Cardiovascular: Normal rate, regular rhythm  and normal heart sounds.  No murmur heard. No BLE edema. Pulmonary/Chest: Effort normal and breath sounds normal. No respiratory distress. Abdominal: Soft.  There is no tenderness. Muscular skeletal: decrease rom of left knee, uses a cane, slow gait  Psychiatric: Patient has a normal mood and affect. behavior is normal. Judgment and thought content normal.   Vitals:   05/21/23 1104 05/21/23 1111  BP: (!) 142/82 136/74  Pulse: 68   Resp: 16   SpO2: 98%   Weight: 136 lb 9.6 oz (62 kg)   Height: 5\' 3"  (1.6 m)     Body mass index is 24.2 kg/m.  Recent Results (from the past 2160 hours)  CBC with Differential     Status: None   Collection Time: 04/05/23  3:44 PM  Result Value Ref Range   WBC 7.2 4.0 - 10.5 K/uL   RBC 5.00 3.87 - 5.11 MIL/uL   Hemoglobin 14.0 12.0 - 15.0 g/dL   HCT 16.1 09.6 - 04.5 %   MCV 88.6 80.0 - 100.0 fL   MCH 28.0 26.0 - 34.0 pg   MCHC 31.6 30.0 - 36.0 g/dL   RDW 40.9 81.1 - 91.4 %   Platelets 311 150 - 400 K/uL   nRBC 0.0 0.0 - 0.2 %   Neutrophils Relative % 59 %   Neutro Abs 4.3 1.7 - 7.7 K/uL   Lymphocytes Relative 31 %   Lymphs Abs 2.2 0.7 - 4.0 K/uL   Monocytes Relative 7 %   Monocytes Absolute 0.5 0.1 - 1.0 K/uL   Eosinophils Relative 2 %   Eosinophils Absolute 0.1 0.0 - 0.5 K/uL   Basophils Relative 1 %   Basophils Absolute 0.1 0.0 - 0.1 K/uL   Immature  Granulocytes 0 %   Abs Immature Granulocytes 0.01 0.00 - 0.07 K/uL    Comment: Performed at [facility], [address] [place], [place] [address]  Comprehensive metabolic panel     Status: Abnormal   Collection Time: 04/05/23  3:44 PM  Result Value Ref Range   Sodium 142 135 - 145 mmol/L   Potassium 3.8 3.5 - 5.1 mmol/L   Chloride 104 98 - 111 mmol/L   CO2 28 22 - 32 mmol/L   Glucose, Bld 106 (H) 70 - 99 mg/dL    Comment: Glucose reference range applies only to samples taken after fasting for at least 8 hours.   BUN 16 8 - 23 mg/dL   Creatinine, Ser 6.21 0.44 - 1.00 mg/dL   Calcium 8.8 (L) 8.9 - 10.3 mg/dL   Total Protein 6.9 6.5 - 8.1 g/dL   Albumin 3.8 3.5 - 5.0 g/dL   AST 22 15 - 41 U/L   ALT 21 0 - 44 U/L   Alkaline Phosphatase 122 38 - 126 U/L   Total Bilirubin 1.0 0.0 - 1.2 mg/dL   GFR, Estimated >30 >86 mL/min    Comment: (NOTE) Calculated using the CKD-EPI Creatinine Equation (2021)    Anion gap 10 5 - 15    Comment: Performed at [facility], [address] [place], [place] [address]  POCT glycosylated hemoglobin (Hb A1C)     Status: Abnormal   Collection Time: 05/21/23 11:11 AM  Result Value Ref Range   Hemoglobin A1C 6.3 (A) 4.0 - 5.6 %   HbA1c POC (<> result, manual entry)     HbA1c, POC (prediabetic range)     HbA1c, POC (controlled diabetic range)       PHQ2/9:  04/12/2023    1:20 PM 11/18/2022    7:50 AM 09/04/2022    8:33 AM 06/01/2022   10:48 AM 01/30/2022   10:50 AM  Depression screen PHQ 2/9  Decreased Interest 0 0 0 0 0  Down, Depressed, Hopeless 0 0 0 0 0  PHQ - 2 Score 0 0 0 0 0  Altered sleeping 0 0  0 0  Tired, decreased energy 0 0  0 0  Change in appetite 0 0  0 2  Feeling bad or failure about yourself  0 0  0 0  Trouble concentrating 0 0  0 0  Moving slowly or fidgety/restless 0 0  0 0  Suicidal thoughts 0 0  0 0  PHQ-9 Score 0 0  0 2  Difficult doing work/chores Not difficult at all Not difficult at all        phq 9 is negative  Fall Risk:    05/21/2023   11:06 AM 11/18/2022    7:50 AM 09/04/2022    8:26 AM 06/01/2022   10:48 AM 01/30/2022   10:49 AM  Fall Risk   Falls in the past year? 0 0 0 0 0  Number falls in past yr: 0 0 0  0  Injury with Fall? 0 0 0  0  Risk for fall due to : No Fall Risks No Fall Risks No Fall Risks No Fall Risks Impaired balance/gait  Follow up Falls prevention discussed;Education provided;Falls evaluation completed Falls prevention discussed;Falls evaluation completed;Education provided Education provided;Falls prevention discussed Falls prevention discussed;Education provided;Falls evaluation completed Falls prevention discussed     Assessment & Plan  1. Dyslipidemia associated with type 2 diabetes mellitus (HCC) (Primary)  - POCT glycosylated hemoglobin (Hb A1C) - atorvastatin (LIPITOR) 40 MG tablet; Take 1 tablet (40 mg total) by mouth daily. TAKE 1 TABLET(40 MG) BY MOUTH DAILY  Dispense: 90 tablet; Refill: 1  2. PAD (peripheral artery disease) (HCC)  - Ambulatory referral to Vascular Surgery On statin and aspirin   3. Small vessel disease (HCC)  On statin therapy and aspirin  4. Calcified cerebral meningioma (HCC)  Seen in the past , stable  5. Essential hypertension  - amLODipine (NORVASC) 2.5 MG tablet; Take 1 tablet (2.5 mg total) by mouth daily.  Dispense: 90 tablet; Refill: 1 - losartan (COZAAR) 50 MG tablet; Take 1 tablet (50 mg total) by mouth daily.  Dispense: 90 tablet; Refill: 1  6. Peripheral polyneuropathy  Managed with gabapentin   7. GERD without esophagitis  - pantoprazole (PROTONIX) 40 MG tablet; Take 1 tablet (40 mg total) by mouth daily.  Dispense: 90 tablet; Refill: 1  8. B12 deficiency  Taking 3 times  a week  9. History of CVA (cerebrovascular accident) without residual deficits  Doing well   10. Osteopenia of multiple sites  Does not want to have bone density at this time

## 2023-06-03 ENCOUNTER — Telehealth: Payer: Self-pay

## 2023-06-03 NOTE — Telephone Encounter (Signed)
 Called pt back no answer left detailed vm stating:  Referral is still pending, referral team is behind with referrals hopefully they contact her within the next week.

## 2023-06-03 NOTE — Telephone Encounter (Signed)
 Copied from CRM [id number]. Topic: General - Other >> Jun 03, 2023  8:04 AM Emylou G wrote: Reason for CRM: Pls call patient.. is expecting a call back on a referrel to have her veins checked.Aaron Aas Pls call her number on file. [phone] Cell

## 2023-06-08 ENCOUNTER — Other Ambulatory Visit
Admission: RE | Admit: 2023-06-08 | Discharge: 2023-06-08 | Disposition: A | Discharge status: Home / Self Care | Source: Ambulatory Visit | Attending: *Deleted | Admitting: *Deleted

## 2023-06-08 ENCOUNTER — Emergency Department

## 2023-06-08 ENCOUNTER — Ambulatory Visit (INDEPENDENT_AMBULATORY_CARE_PROVIDER_SITE_OTHER): Admitting: Family Medicine

## 2023-06-08 ENCOUNTER — Encounter: Payer: Self-pay | Admitting: Family Medicine

## 2023-06-08 ENCOUNTER — Other Ambulatory Visit: Payer: Self-pay

## 2023-06-08 ENCOUNTER — Ambulatory Visit: Payer: Self-pay

## 2023-06-08 ENCOUNTER — Emergency Department
Admission: EM | Admit: 2023-06-08 | Discharge: 2023-06-08 | Disposition: A | Discharge status: Home / Self Care | Source: Home / Self Care | Attending: Emergency Medicine | Admitting: Emergency Medicine

## 2023-06-08 VITALS — BP 132/74 | HR 81 | Resp 18 | Ht 63.0 in | Wt 137.1 lb

## 2023-06-08 DIAGNOSIS — K625 Hemorrhage of anus and rectum: Secondary | ICD-10-CM | POA: Insufficient documentation

## 2023-06-08 DIAGNOSIS — K5790 Diverticulosis of intestine, part unspecified, without perforation or abscess without bleeding: Secondary | ICD-10-CM

## 2023-06-08 DIAGNOSIS — I10 Essential (primary) hypertension: Secondary | ICD-10-CM | POA: Insufficient documentation

## 2023-06-08 DIAGNOSIS — Z96652 Presence of left artificial knee joint: Secondary | ICD-10-CM | POA: Insufficient documentation

## 2023-06-08 DIAGNOSIS — D62 Acute posthemorrhagic anemia: Secondary | ICD-10-CM | POA: Diagnosis not present

## 2023-06-08 LAB — CBC WITH DIFFERENTIAL/PLATELET
Abs Immature Granulocytes: 0.02 10*3/uL (ref 0.00–0.07)
Basophils Absolute: 0 10*3/uL (ref 0.0–0.1)
Basophils Relative: 0 %
Eosinophils Absolute: 0.1 10*3/uL (ref 0.0–0.5)
Eosinophils Relative: 2 %
HCT: 32.9 % — ABNORMAL LOW (ref 36.0–46.0)
Hemoglobin: 10.6 g/dL — ABNORMAL LOW (ref 12.0–15.0)
Immature Granulocytes: 0 %
Lymphocytes Relative: 30 %
Lymphs Abs: 2.4 10*3/uL (ref 0.7–4.0)
MCH: 28.4 pg (ref 26.0–34.0)
MCHC: 32.2 g/dL (ref 30.0–36.0)
MCV: 88.2 fL (ref 80.0–100.0)
Monocytes Absolute: 0.6 10*3/uL (ref 0.1–1.0)
Monocytes Relative: 8 %
Neutro Abs: 4.8 10*3/uL (ref 1.7–7.7)
Neutrophils Relative %: 60 %
Platelets: 289 10*3/uL (ref 150–400)
RBC: 3.73 MIL/uL — ABNORMAL LOW (ref 3.87–5.11)
RDW: 14.7 % (ref 11.5–15.5)
WBC: 8 10*3/uL (ref 4.0–10.5)
nRBC: 0 % (ref 0.0–0.2)

## 2023-06-08 LAB — COMPREHENSIVE METABOLIC PANEL WITH GFR
ALT: 16 U/L (ref 0–44)
AST: 20 U/L (ref 15–41)
Albumin: 3.3 g/dL — ABNORMAL LOW (ref 3.5–5.0)
Alkaline Phosphatase: 96 U/L (ref 38–126)
Anion gap: 13 (ref 5–15)
BUN: 24 mg/dL — ABNORMAL HIGH (ref 8–23)
CO2: 24 mmol/L (ref 22–32)
Calcium: 8.7 mg/dL — ABNORMAL LOW (ref 8.9–10.3)
Chloride: 103 mmol/L (ref 98–111)
Creatinine, Ser: 0.8 mg/dL (ref 0.44–1.00)
GFR, Estimated: >60 mL/min (ref >60)
Glucose, Bld: 151 mg/dL — ABNORMAL HIGH (ref 70–99)
Potassium: 3.5 mmol/L (ref 3.5–5.1)
Sodium: 140 mmol/L (ref 135–145)
Total Bilirubin: 0.9 mg/dL (ref 0.0–1.2)
Total Protein: 6 g/dL — ABNORMAL LOW (ref 6.5–8.1)

## 2023-06-08 LAB — CBC
HCT: 34.4 % — ABNORMAL LOW (ref 36.0–46.0)
HCT: 29.2 % — ABNORMAL LOW (ref 36.0–46.0)
Hemoglobin: 10.8 g/dL — ABNORMAL LOW (ref 12.0–15.0)
Hemoglobin: 9.4 g/dL — ABNORMAL LOW (ref 12.0–15.0)
MCH: 27.9 pg (ref 26.0–34.0)
MCH: 28 pg (ref 26.0–34.0)
MCHC: 31.4 g/dL (ref 30.0–36.0)
MCHC: 32.2 g/dL (ref 30.0–36.0)
MCV: 88.9 fL (ref 80.0–100.0)
MCV: 86.9 fL (ref 80.0–100.0)
Platelets: 307 10*3/uL (ref 150–400)
Platelets: 260 10*3/uL (ref 150–400)
RBC: 3.87 MIL/uL (ref 3.87–5.11)
RBC: 3.36 MIL/uL — ABNORMAL LOW (ref 3.87–5.11)
RDW: 14.8 % (ref 11.5–15.5)
RDW: 14.7 % (ref 11.5–15.5)
WBC: 7.1 10*3/uL (ref 4.0–10.5)
WBC: 7.1 10*3/uL (ref 4.0–10.5)
nRBC: 0 % (ref 0.0–0.2)
nRBC: 0 % (ref 0.0–0.2)

## 2023-06-08 MED ORDER — IOHEXOL 350 MG/ML SOLN
100.0000 mL | Freq: Once | INTRAVENOUS | Status: AC | PRN
  Administered 2023-06-08: 100 mL via INTRAVENOUS

## 2023-06-08 NOTE — ED Triage Notes (Signed)
 Pt comes in via pov from her primary care due to having possible rectal bleeding. Pt states that the rectal bleeding started last night. Pt reports bright red blood in her stool. Pt reports mild abdominal pain rating her pain 4/10. No dizziness, or weakness complaints by patient. Pt is alert and oriented x4, with no signs of acute distress at this time.

## 2023-06-08 NOTE — Telephone Encounter (Signed)
  Chief Complaint: rectal bleeding Symptoms: bright red blood on toilet tissue and in water Frequency: 2 episodes since last night Pertinent Negatives: Patient denies abdominal pain, rectal pain, nausea, vomiting, fever, dizziness/lightheaded, recent colonoscopy, blood clots Disposition: [] ED /[] Urgent Care (no appt availability in office) / [x] Appointment(In office/virtual)/ []  [facility]/ [] Home Care/ [] Refused Recommended Disposition /[]  Mobile Bus/ []  Follow-up with PCP Additional Notes: Patient states she did not go to work today due to the blood in the toilet this morning after drinking coffee and eating oatmeal. Patient states she also had an episode last night. Patient agreeable to acute visit with PCP this afternoon. Advised patient if she feels weak, diaphoretic, dizzy, or the bleeding increases to go straight to ED.  Copied from CRM [id number]. Topic: Clinical - Red Word Triage >> Jun 08, 2023  8:22 AM Loreda Rodriguez T wrote: [facility] that prompted transfer to Nurse Triage: patient called stated she was making a bowel movement last night and she started bleeding. It was not painful but her stomach was full and and hurting. Reason for Disposition  MODERATE rectal bleeding (small blood clots, passing blood without stool, or toilet water turns red)  Answer Assessment - Initial Assessment Questions 1. APPEARANCE of BLOOD: "What color is it?" "Is it passed separately, on the surface of the stool, or mixed in with the stool?"      Bright red blood, on the toilet tissue paper and in the water.  2. AMOUNT: "How much blood was passed?"      Patient unable to state how much, "I don't know".  3. FREQUENCY: "How many times has blood been passed with the stools?"      Once last night and once this morning.  4. ONSET: "When was the blood first seen in the stools?" (Days or weeks)      Last night.  5. DIARRHEA: "Is there also some diarrhea?" If Yes, ask: "How many diarrhea stools  in the past 24 hours?"      Yes. She states she has been having mushy liquid stools. Denies any in the last 24 hours.  6. CONSTIPATION: "Do you have constipation?" If Yes, ask: "How bad is it?"     Yes, she states she takes laxative suppositories once in a while. She states she has a bowel movement once every three days and depends on what she is eating.  7. RECURRENT SYMPTOMS: "Have you had blood in your stools before?" If Yes, ask: "When was the last time?" and "What happened that time?"      Denies.  8. BLOOD THINNERS: "Do you take any blood thinners?" (e.g., Coumadin/warfarin, Pradaxa/dabigatran, aspirin )     Aspirin  81mg .  9. OTHER SYMPTOMS: "Do you have any other symptoms?"  (e.g., abdomen pain, vomiting, dizziness, fever)     Gas, bloating.  10. PREGNANCY: "Is there any chance you are pregnant?" "When was your last menstrual period?"       N/A.  Protocols used: Rectal Bleeding-A-AH

## 2023-06-08 NOTE — ED Notes (Signed)
 This RN introduced self to pt. Call light in reach, bed wheels locked, side rail raised, pt updated on plan of care. Rounding completed. High fall risk precautions in place.

## 2023-06-08 NOTE — Addendum Note (Signed)
 Addended by: Arleen Lacer F on: 06/08/2023 02:13 PM   Modules accepted: Orders

## 2023-06-08 NOTE — Progress Notes (Addendum)
 Name: Catherine Burgess   MRN: [id number]    DOB: 1936-01-17   Date:06/08/2023       Progress Note  Subjective  Chief Complaint  Chief Complaint  Patient presents with   Bloated   Gas    A lot of gas while doing bowel movement   Rectal Bleeding    Started last night almost like a cycle flow    Discussed the use of AI scribe software for clinical note transcription with the patient, who gave verbal consent to proceed.  History of Present Illness Catherine Burgess is an 88 year old female with diverticular disease who presents with rectal bleeding.  She has been experiencing rectal bleeding that began last night, with bright red blood noticed in the toilet and on the tissue after using the bathroom around 9 or 10 PM. The bleeding was present again this morning after a bowel movement. There is no associated pain, straining, or pushing during bowel movements.  She has a history of diverticular disease identified during a colonoscopy in 2022, which did not reveal any hemorrhoids or other abnormalities in the anal rectal area. There have been no recent changes in diet or medication that could contribute to the bleeding. She is currently taking a baby aspirin  but no other medications that could increase bleeding risk.  No fever, chills, nausea, vomiting, rectal pain, or vaginal bleeding. The bleeding occurs only after bowel movements.    Patient Active Problem List   Diagnosis Date Noted   B12 deficiency 06/01/2022   Peripheral polyneuropathy 06/01/2022   Dyslipidemia associated with type 2 diabetes mellitus (HCC) 06/01/2022   PAD (peripheral artery disease) (HCC) 01/30/2022   Diabetes mellitus type 2, diet-controlled (HCC) 05/11/2021   Altered bowel habits    Abnormal ankle brachial index (ABI) 03/02/2018   Calcified cerebral meningioma (HCC) 10/04/2017   History of CVA (cerebrovascular accident) without residual deficits 10/04/2017   Subacromial impingement of right shoulder  10/04/2017   History of anemia 10/13/2016   Abnormal CT of brain 07/23/2016   Vitamin D  deficiency 10/28/2015   Post menopausal syndrome 10/28/2015   Osteopenia of multiple sites 10/28/2015   Cervical disc disease 10/28/2015   H/O total knee replacement 01/20/2015   Vaginal atrophy 09/26/2014   Osteoarthritis of both knees 07/31/2014   GERD without esophagitis 07/31/2014   HLD (hyperlipidemia) 07/31/2014   Essential hypertension 07/31/2014    Social History   Tobacco Use   Smoking status: Former    Current packs/day: 0.00    Average packs/day: 0.3 packs/day for 1 year (0.3 ttl pk-yrs)    Types: Cigarettes    Start date: 38    Quit date: 1966    Years since quitting: 59.3   Smokeless tobacco: Never   Tobacco comments:    smoking cessation materials not required  Substance Use Topics   Alcohol use: No    Alcohol/week: 0.0 standard drinks of alcohol     Current Outpatient Medications:    acetaminophen  (TYLENOL ) 500 MG tablet, Take 1 tablet (500 mg total) by mouth 2 (two) times daily. (Patient taking differently: Take 500 mg by mouth every 6 (six) hours as needed for mild pain (pain score 1-3).), Disp: 180 tablet, Rfl: 0   amLODipine  (NORVASC ) 2.5 MG tablet, Take 1 tablet (2.5 mg total) by mouth daily., Disp: 90 tablet, Rfl: 1   aspirin  EC 81 MG tablet, Take 81 mg by mouth., Disp: , Rfl:    atorvastatin  (LIPITOR) 40 MG tablet, Take 1 tablet (  40 mg total) by mouth daily. TAKE 1 TABLET(40 MG) BY MOUTH DAILY, Disp: 90 tablet, Rfl: 1   Calcium -Vitamin D  600-200 MG-UNIT tablet, Take 1 tablet by mouth daily. , Disp: , Rfl:    fluticasone (FLONASE) 50 MCG/ACT nasal spray, Place into the nose., Disp: , Rfl:    gabapentin  (NEURONTIN ) 300 MG capsule, Take 1 capsule (300 mg total) by mouth every morning AND 2 capsules (600 mg total) at bedtime., Disp: 270 capsule, Rfl: 1   losartan  (COZAAR ) 50 MG tablet, Take 1 tablet (50 mg total) by mouth daily., Disp: 90 tablet, Rfl: 1   pantoprazole   (PROTONIX ) 40 MG tablet, Take 1 tablet (40 mg total) by mouth daily., Disp: 90 tablet, Rfl: 1   tiZANidine  (ZANAFLEX ) 2 MG tablet, Take 1 tablet (2 mg total) by mouth at bedtime. PRN, Disp: 90 tablet, Rfl: 1  Allergies  Allergen Reactions   Lovastatin Other (See Comments)    chest   Pravastatin     Other reaction(s): Muscle Pain chest   Rosuvastatin     Other reaction(s): Muscle Pain chest Other reaction(s): Muscle Pain chest   Statins     Other reaction(s): Muscle Pain chest    ROS  Ten systems reviewed and is negative except as mentioned in HPI    Objective  Vitals:   06/08/23 1256  BP: 132/74  Pulse: 81  Resp: 18  SpO2: 91%  Weight: 137 lb 1.6 oz (62.2 kg)  Height: 5\' 3"  (1.6 m)    Body mass index is 24.29 kg/m.  Physical Exam CONSTITUTIONAL: Patient appears well-developed and well-nourished. No distress. HEENT: Head atraumatic, normocephalic, neck supple. CARDIOVASCULAR: Normal rate, regular rhythm and normal heart sounds. No murmur heard. No BLE edema. PULMONARY: Effort normal and breath sounds normal. No respiratory distress. ABDOMINAL: There is no tenderness or distention. MUSCULOSKELETAL: unable to flex left knee, slow gait  Vulva: atrophy , pelvic not done PSYCHIATRIC: Patient has a normal mood and affect. Behavior is normal. Judgment and thought content normal. RECTAL: Skin tag present at rectum. Bleeding from rectum observed, dark stool on examining glove.  Recent Results (from the past 2160 hours)  CBC with Differential     Status: None   Collection Time: 04/05/23  3:44 PM  Result Value Ref Range   WBC 7.2 4.0 - 10.5 K/uL   RBC 5.00 3.87 - 5.11 MIL/uL   Hemoglobin 14.0 12.0 - 15.0 g/dL   HCT 04.5 40.9 - 81.1 %   MCV 88.6 80.0 - 100.0 fL   MCH 28.0 26.0 - 34.0 pg   MCHC 31.6 30.0 - 36.0 g/dL   RDW 91.4 78.2 - 95.6 %   Platelets 311 150 - 400 K/uL   nRBC 0.0 0.0 - 0.2 %   Neutrophils Relative % 59 %   Neutro Abs 4.3 1.7 - 7.7 K/uL    Lymphocytes Relative 31 %   Lymphs Abs 2.2 0.7 - 4.0 K/uL   Monocytes Relative 7 %   Monocytes Absolute 0.5 0.1 - 1.0 K/uL   Eosinophils Relative 2 %   Eosinophils Absolute 0.1 0.0 - 0.5 K/uL   Basophils Relative 1 %   Basophils Absolute 0.1 0.0 - 0.1 K/uL   Immature Granulocytes 0 %   Abs Immature Granulocytes 0.01 0.00 - 0.07 K/uL    Comment: Performed at [facility], [address], [place], [place] [address]  Comprehensive metabolic panel     Status: Abnormal   Collection Time: 04/05/23  3:44 PM  Result  Value Ref Range   Sodium 142 135 - 145 mmol/L   Potassium 3.8 3.5 - 5.1 mmol/L   Chloride 104 98 - 111 mmol/L   CO2 28 22 - 32 mmol/L   Glucose, Bld 106 (H) 70 - 99 mg/dL    Comment: Glucose reference range applies only to samples taken after fasting for at least 8 hours.   BUN 16 8 - 23 mg/dL   Creatinine, Ser 1.61 0.44 - 1.00 mg/dL   Calcium  8.8 (L) 8.9 - 10.3 mg/dL   Total Protein 6.9 6.5 - 8.1 g/dL   Albumin 3.8 3.5 - 5.0 g/dL   AST 22 15 - 41 U/L   ALT 21 0 - 44 U/L   Alkaline Phosphatase 122 38 - 126 U/L   Total Bilirubin 1.0 0.0 - 1.2 mg/dL   GFR, Estimated >09 >60 mL/min    Comment: (NOTE) Calculated using the CKD-EPI Creatinine Equation (2021)    Anion gap 10 5 - 15    Comment: Performed at [facility], [address] [place], [place] [address]  POCT glycosylated hemoglobin (Hb A1C)     Status: Abnormal   Collection Time: 05/21/23 11:11 AM  Result Value Ref Range   Hemoglobin A1C 6.3 (A) 4.0 - 5.6 %   HbA1c POC (<> result, manual entry)     HbA1c, POC (prediabetic range)     HbA1c, POC (controlled diabetic range)      Assessment & Plan Rectal bleeding Acute rectal bleeding with dark blood, likely rectal source, possibly related to diverticular disease. Potential for significant blood loss. - we will check CBC stat at [facility] if hemoglobin is below 11 we will send her to [facility] since last Hemoglobin was 14 2 months ago  - Dr. Baldomero Bone was  notified and will see her this week  -advised to go to [facility] even if hemoglobin is stable but she experiences increase in bowel frequency with blood in stools, develops dizziness or tachycardia or feels weak   Diverticular disease Diverticular disease with potential bleeding from diverticula. Differential diagnosis includes diverticular bleeding given presence of diverticula and absence of hemorrhoids, new rectal lesion.  - Consider hospital referral for blood test to assess blood loss

## 2023-06-08 NOTE — ED Provider Notes (Signed)
 [facility] Provider Note    Event Date/Time   First MD Initiated Contact with Patient 06/08/23 2040     (approximate)   History   Chief Complaint: Rectal Bleeding   HPI  Catherine Burgess is a 88 y.o. female with a history of hypertension, hyperlipidemia, GERD who was sent to the ED from primary care due to rectal bleeding that started last night.  Patient had a few episodes of passing bright red blood in her stool.  Denies any abdominal pain or fever.  Last episode was this morning, no additional bleeding in the last 12 hours or so.  Does not take blood thinners.        Past Medical History:  Diagnosis Date   Degenerative joint disease    GERD (gastroesophageal reflux disease)    Hyperlipidemia    Hypertension    Malignant essential hypertension 07/23/2016   Osteoarthrosis    Reflux esophagitis     Current Outpatient Rx   Order #: [id number] Class: Normal   Order #: [id number] Class: Normal   Order #: [id number] Class: Historical Med   Order #: [id number] Class: Normal   Order #: [id number] Class: Historical Med   Order #: [id number] Class: Historical Med   Order #: [id number] Class: Normal   Order #: [id number] Class: Normal   Order #: [id number] Class: Normal   Order #: [id number] Class: Normal    Past Surgical History:  Procedure Laterality Date   CATARACT EXTRACTION, BILATERAL     COLONOSCOPY WITH PROPOFOL  N/A 06/20/2020   Procedure: COLONOSCOPY WITH PROPOFOL ;  Surgeon: Selena Daily, MD;  Location: [facility] ENDOSCOPY;  Service: Gastroenterology;  Laterality: N/A;   EYE SURGERY     HIP PINNING Left 1956   JOINT REPLACEMENT     REPLACEMENT TOTAL KNEE Left 11/12/2009   TOTAL KNEE REVISION Left 12/07/2016   Procedure: TOTAL KNEE REVISION;  Surgeon: Arlyne Lame, MD;  Location: [facility] ORS;  Service: Orthopedics;  Laterality: Left;    Physical Exam   Triage Vital Signs: ED Triage Vitals  Encounter Vitals Group     BP 06/08/23 1631 118/73      Systolic BP Percentile --      Diastolic BP Percentile --      Pulse Rate 06/08/23 1631 87     Resp 06/08/23 1631 18     Temp 06/08/23 1631 98.5 F (36.9 C)     Temp Source 06/08/23 1631 Oral     SpO2 06/08/23 1631 99 %     Weight 06/08/23 1632 137 lb 2 oz (62.2 kg)     Height 06/08/23 1632 5\' 3"  (1.6 m)     Head Circumference --      Peak Flow --      Pain Score 06/08/23 1631 4     Pain Loc --      Pain Education --      Exclude from Growth Chart --     Most recent vital signs: Vitals:   06/08/23 2050 06/08/23 2200  BP: (!) 114/52 (!) 122/58  Pulse: 72 67  Resp: 20   Temp:    SpO2: 100% 97%    General: Awake, no distress.  CV:  Good peripheral perfusion.  Regular rhythm Resp:  Normal effort.  Abd:  No distention.  Soft, nontender Other:  Moist oral mucosa   ED Results / Procedures / Treatments   Labs (all labs ordered are listed, but only abnormal results are displayed) Labs Reviewed  COMPREHENSIVE METABOLIC PANEL WITH GFR - Abnormal; Notable  for the following components:      Result Value   Glucose, Bld 151 (*)    BUN 24 (*)    Calcium  8.7 (*)    Total Protein 6.0 (*)    Albumin 3.3 (*)    All other components within normal limits  CBC - Abnormal; Notable for the following components:   Hemoglobin 10.8 (*)    HCT 34.4 (*)    All other components within normal limits  CBC - Abnormal; Notable for the following components:   RBC 3.36 (*)    Hemoglobin 9.4 (*)    HCT 29.2 (*)    All other components within normal limits  TYPE AND SCREEN     EKG    RADIOLOGY CT abdomen pelvis interpreted by me, negative for obvious mass or luminal contrast extravasation.  Radiology report reviewed   PROCEDURES:  Procedures   MEDICATIONS ORDERED IN ED: Medications  iohexol (OMNIPAQUE) 350 MG/ML injection 100 mL (100 mLs Intravenous Contrast Given 06/08/23 2105)     IMPRESSION / MDM / ASSESSMENT AND PLAN / ED COURSE  I reviewed the triage vital signs and the  nursing notes.  DDx: Diverticular bleed, hemorrhoid, anal fissure, anemia  Patient's presentation is most consistent with acute presentation with potential threat to life or bodily function.  Patient presents with suspected rectal bleeding.  Vital signs are normal, bleeding seems to have stopped.  Labs and CT abdomen are reassuring, hemoglobin is relatively stable.  Not on anticoagulation.  Stable for discharge for outpatient follow-up with gastroenterology as initiated by her PCP.       FINAL CLINICAL IMPRESSION(S) / ED DIAGNOSES   Final diagnoses:  Rectal bleeding     Rx / DC Orders   ED Discharge Orders     None        Note:  This document was prepared using Dragon voice recognition software and may include unintentional dictation errors.   Jacquie Maudlin, MD 06/08/23 [phone]

## 2023-06-09 ENCOUNTER — Inpatient Hospital Stay
Admission: EM | Admit: 2023-06-09 | Discharge: 2023-06-14 | DRG: 811 | Disposition: A | Discharge status: Home / Self Care | Source: Ambulatory Visit | Attending: Osteopathic Medicine | Admitting: Osteopathic Medicine

## 2023-06-09 ENCOUNTER — Telehealth: Payer: Self-pay

## 2023-06-09 ENCOUNTER — Other Ambulatory Visit: Payer: Self-pay

## 2023-06-09 ENCOUNTER — Encounter: Payer: Self-pay | Admitting: Internal Medicine

## 2023-06-09 DIAGNOSIS — Z888 Allergy status to other drugs, medicaments and biological substances status: Secondary | ICD-10-CM

## 2023-06-09 DIAGNOSIS — Z87891 Personal history of nicotine dependence: Secondary | ICD-10-CM | POA: Diagnosis not present

## 2023-06-09 DIAGNOSIS — D509 Iron deficiency anemia, unspecified: Secondary | ICD-10-CM | POA: Diagnosis present

## 2023-06-09 DIAGNOSIS — R519 Headache, unspecified: Secondary | ICD-10-CM | POA: Diagnosis not present

## 2023-06-09 DIAGNOSIS — E785 Hyperlipidemia, unspecified: Secondary | ICD-10-CM | POA: Diagnosis present

## 2023-06-09 DIAGNOSIS — Z9842 Cataract extraction status, left eye: Secondary | ICD-10-CM

## 2023-06-09 DIAGNOSIS — D62 Acute posthemorrhagic anemia: Secondary | ICD-10-CM | POA: Diagnosis present

## 2023-06-09 DIAGNOSIS — Z8673 Personal history of transient ischemic attack (TIA), and cerebral infarction without residual deficits: Secondary | ICD-10-CM | POA: Diagnosis not present

## 2023-06-09 DIAGNOSIS — K625 Hemorrhage of anus and rectum: Secondary | ICD-10-CM | POA: Diagnosis not present

## 2023-06-09 DIAGNOSIS — K5710 Diverticulosis of small intestine without perforation or abscess without bleeding: Secondary | ICD-10-CM | POA: Diagnosis present

## 2023-06-09 DIAGNOSIS — M1990 Unspecified osteoarthritis, unspecified site: Secondary | ICD-10-CM | POA: Diagnosis present

## 2023-06-09 DIAGNOSIS — R109 Unspecified abdominal pain: Secondary | ICD-10-CM | POA: Diagnosis not present

## 2023-06-09 DIAGNOSIS — M8589 Other specified disorders of bone density and structure, multiple sites: Secondary | ICD-10-CM | POA: Diagnosis present

## 2023-06-09 DIAGNOSIS — Z825 Family history of asthma and other chronic lower respiratory diseases: Secondary | ICD-10-CM

## 2023-06-09 DIAGNOSIS — K838 Other specified diseases of biliary tract: Secondary | ICD-10-CM | POA: Diagnosis not present

## 2023-06-09 DIAGNOSIS — K219 Gastro-esophageal reflux disease without esophagitis: Secondary | ICD-10-CM

## 2023-06-09 DIAGNOSIS — E1140 Type 2 diabetes mellitus with diabetic neuropathy, unspecified: Secondary | ICD-10-CM | POA: Diagnosis present

## 2023-06-09 DIAGNOSIS — Z8249 Family history of ischemic heart disease and other diseases of the circulatory system: Secondary | ICD-10-CM

## 2023-06-09 DIAGNOSIS — K921 Melena: Secondary | ICD-10-CM | POA: Diagnosis present

## 2023-06-09 DIAGNOSIS — K449 Diaphragmatic hernia without obstruction or gangrene: Secondary | ICD-10-CM | POA: Diagnosis present

## 2023-06-09 DIAGNOSIS — Z136 Encounter for screening for cardiovascular disorders: Secondary | ICD-10-CM | POA: Diagnosis not present

## 2023-06-09 DIAGNOSIS — Z9889 Other specified postprocedural states: Secondary | ICD-10-CM

## 2023-06-09 DIAGNOSIS — E1169 Type 2 diabetes mellitus with other specified complication: Secondary | ICD-10-CM | POA: Diagnosis present

## 2023-06-09 DIAGNOSIS — Z96652 Presence of left artificial knee joint: Secondary | ICD-10-CM | POA: Diagnosis present

## 2023-06-09 DIAGNOSIS — I10 Essential (primary) hypertension: Secondary | ICD-10-CM | POA: Diagnosis present

## 2023-06-09 DIAGNOSIS — Z823 Family history of stroke: Secondary | ICD-10-CM

## 2023-06-09 DIAGNOSIS — Z79899 Other long term (current) drug therapy: Secondary | ICD-10-CM

## 2023-06-09 DIAGNOSIS — E1151 Type 2 diabetes mellitus with diabetic peripheral angiopathy without gangrene: Secondary | ICD-10-CM | POA: Diagnosis present

## 2023-06-09 DIAGNOSIS — K5731 Diverticulosis of large intestine without perforation or abscess with bleeding: Secondary | ICD-10-CM | POA: Diagnosis present

## 2023-06-09 DIAGNOSIS — Z7982 Long term (current) use of aspirin: Secondary | ICD-10-CM

## 2023-06-09 DIAGNOSIS — K648 Other hemorrhoids: Secondary | ICD-10-CM | POA: Diagnosis not present

## 2023-06-09 DIAGNOSIS — Z833 Family history of diabetes mellitus: Secondary | ICD-10-CM | POA: Diagnosis not present

## 2023-06-09 DIAGNOSIS — K640 First degree hemorrhoids: Secondary | ICD-10-CM | POA: Diagnosis present

## 2023-06-09 DIAGNOSIS — K5730 Diverticulosis of large intestine without perforation or abscess without bleeding: Secondary | ICD-10-CM | POA: Diagnosis not present

## 2023-06-09 DIAGNOSIS — Z9841 Cataract extraction status, right eye: Secondary | ICD-10-CM

## 2023-06-09 DIAGNOSIS — I739 Peripheral vascular disease, unspecified: Secondary | ICD-10-CM | POA: Diagnosis present

## 2023-06-09 DIAGNOSIS — Z809 Family history of malignant neoplasm, unspecified: Secondary | ICD-10-CM

## 2023-06-09 DIAGNOSIS — G629 Polyneuropathy, unspecified: Secondary | ICD-10-CM

## 2023-06-09 DIAGNOSIS — K922 Gastrointestinal hemorrhage, unspecified: Secondary | ICD-10-CM | POA: Diagnosis not present

## 2023-06-09 DIAGNOSIS — M19012 Primary osteoarthritis, left shoulder: Secondary | ICD-10-CM | POA: Diagnosis present

## 2023-06-09 DIAGNOSIS — Z972 Presence of dental prosthetic device (complete) (partial): Secondary | ICD-10-CM

## 2023-06-09 DIAGNOSIS — Z96659 Presence of unspecified artificial knee joint: Secondary | ICD-10-CM

## 2023-06-09 DIAGNOSIS — Z820 Family history of epilepsy and other diseases of the nervous system: Secondary | ICD-10-CM

## 2023-06-09 DIAGNOSIS — Z8719 Personal history of other diseases of the digestive system: Secondary | ICD-10-CM

## 2023-06-09 LAB — CBC
HCT: 27.9 % — ABNORMAL LOW (ref 36.0–46.0)
Hemoglobin: 8.9 g/dL — ABNORMAL LOW (ref 12.0–15.0)
MCH: 28.3 pg (ref 26.0–34.0)
MCHC: 31.9 g/dL (ref 30.0–36.0)
MCV: 88.9 fL (ref 80.0–100.0)
Platelets: 297 10*3/uL (ref 150–400)
RBC: 3.14 MIL/uL — ABNORMAL LOW (ref 3.87–5.11)
RDW: 15.1 % (ref 11.5–15.5)
WBC: 7.2 10*3/uL (ref 4.0–10.5)
nRBC: 0 % (ref 0.0–0.2)

## 2023-06-09 LAB — COMPREHENSIVE METABOLIC PANEL WITH GFR
ALT: 16 U/L (ref 0–44)
AST: 19 U/L (ref 15–41)
Albumin: 2.9 g/dL — ABNORMAL LOW (ref 3.5–5.0)
Alkaline Phosphatase: 83 U/L (ref 38–126)
Anion gap: 9 (ref 5–15)
BUN: 25 mg/dL — ABNORMAL HIGH (ref 8–23)
CO2: 24 mmol/L (ref 22–32)
Calcium: 7.8 mg/dL — ABNORMAL LOW (ref 8.9–10.3)
Chloride: 108 mmol/L (ref 98–111)
Creatinine, Ser: 0.86 mg/dL (ref 0.44–1.00)
GFR, Estimated: >60 mL/min (ref >60)
Glucose, Bld: 125 mg/dL — ABNORMAL HIGH (ref 70–99)
Potassium: 3.4 mmol/L — ABNORMAL LOW (ref 3.5–5.1)
Sodium: 141 mmol/L (ref 135–145)
Total Bilirubin: 0.5 mg/dL (ref 0.0–1.2)
Total Protein: 5.5 g/dL — ABNORMAL LOW (ref 6.5–8.1)

## 2023-06-09 LAB — TYPE AND SCREEN
ABO/RH(D): O POS
Antibody Screen: NEGATIVE

## 2023-06-09 LAB — MAGNESIUM: Magnesium: 2 mg/dL (ref 1.7–2.4)

## 2023-06-09 MED ORDER — FLUTICASONE PROPIONATE 50 MCG/ACT NA SUSP: 2.0000 | Freq: Every day | NASAL | Status: DC | PRN

## 2023-06-09 MED ORDER — CALCIUM-VITAMIN D 600-200 MG-UNIT PO TABS: 1.0000 | ORAL_TABLET | Freq: Every day | ORAL | Status: DC

## 2023-06-09 MED ORDER — ONDANSETRON HCL 4 MG/2ML IJ SOLN: 4.0000 mg | Freq: Four times a day (QID) | INTRAMUSCULAR | Status: AC | PRN

## 2023-06-09 MED ORDER — GABAPENTIN 300 MG PO CAPS
600.0000 mg | ORAL_CAPSULE | Freq: Every day | ORAL | Status: DC
  Administered 2023-06-09 – 2023-06-13 (×5): 600 mg via ORAL
  Filled 2023-06-09 (×5): qty 2

## 2023-06-09 MED ORDER — LOSARTAN POTASSIUM 50 MG PO TABS
50.0000 mg | ORAL_TABLET | Freq: Every day | ORAL | Status: DC
  Administered 2023-06-11 – 2023-06-14 (×4): 50 mg via ORAL
  Filled 2023-06-09 (×4): qty 1

## 2023-06-09 MED ORDER — ACETAMINOPHEN 650 MG RE SUPP: 650.0000 mg | Freq: Four times a day (QID) | RECTAL | Status: AC | PRN

## 2023-06-09 MED ORDER — ACETAMINOPHEN 325 MG PO TABS
650.0000 mg | ORAL_TABLET | Freq: Four times a day (QID) | ORAL | Status: AC | PRN
  Administered 2023-06-11: 650 mg via ORAL
  Filled 2023-06-09 (×2): qty 2

## 2023-06-09 MED ORDER — PANTOPRAZOLE SODIUM 40 MG PO TBEC
40.0000 mg | DELAYED_RELEASE_TABLET | Freq: Every day | ORAL | Status: DC
  Administered 2023-06-11 – 2023-06-14 (×4): 40 mg via ORAL
  Filled 2023-06-09 (×4): qty 1

## 2023-06-09 MED ORDER — ATORVASTATIN CALCIUM 20 MG PO TABS
40.0000 mg | ORAL_TABLET | Freq: Every day | ORAL | Status: DC
  Administered 2023-06-09 – 2023-06-13 (×5): 40 mg via ORAL
  Filled 2023-06-09 (×5): qty 2

## 2023-06-09 MED ORDER — PEG 3350-KCL-NA BICARB-NACL 420 G PO SOLR
4000.0000 mL | Freq: Once | ORAL | Status: AC
  Administered 2023-06-09: 4000 mL via ORAL
  Filled 2023-06-09: qty 4000

## 2023-06-09 MED ORDER — AMLODIPINE BESYLATE 5 MG PO TABS
2.5000 mg | ORAL_TABLET | Freq: Every day | ORAL | Status: DC
  Administered 2023-06-11 – 2023-06-14 (×4): 2.5 mg via ORAL
  Filled 2023-06-09 (×4): qty 1

## 2023-06-09 MED ORDER — OYSTER SHELL CALCIUM/D3 500-5 MG-MCG PO TABS
1.0000 | ORAL_TABLET | Freq: Every day | ORAL | Status: DC
  Administered 2023-06-11 – 2023-06-14 (×4): 1 via ORAL
  Filled 2023-06-09 (×4): qty 1

## 2023-06-09 MED ORDER — LACTATED RINGERS IV SOLN: INTRAVENOUS | Status: AC

## 2023-06-09 MED ORDER — SENNOSIDES-DOCUSATE SODIUM 8.6-50 MG PO TABS: 1.0000 | ORAL_TABLET | Freq: Every evening | ORAL | Status: DC | PRN

## 2023-06-09 MED ORDER — GABAPENTIN 300 MG PO CAPS
300.0000 mg | ORAL_CAPSULE | Freq: Every day | ORAL | Status: DC
  Administered 2023-06-11 – 2023-06-14 (×4): 300 mg via ORAL
  Filled 2023-06-09 (×4): qty 1

## 2023-06-09 MED ORDER — ONDANSETRON HCL 4 MG PO TABS: 4.0000 mg | ORAL_TABLET | Freq: Four times a day (QID) | ORAL | Status: AC | PRN

## 2023-06-09 NOTE — ED Notes (Signed)
 This RN received report from Wilton Hasting RN and performed care handoff. This RN introduced self to pt. Call light in reach, bed wheels locked, side rails raised, pt updated on plan of care. Rounding completed.

## 2023-06-09 NOTE — Assessment & Plan Note (Signed)
 Suspect secondary to lower GI bleed Clear liquid diet ordered on admission Gastroenterology has been consulted N.p.o. after midnight GoLytely  per GI recommendation for esophagastroduodenoscopy in the a.m.

## 2023-06-09 NOTE — Assessment & Plan Note (Signed)
 Aspirin  not resumed on admission AM team to resume when the benefits outweigh the risk

## 2023-06-09 NOTE — Assessment & Plan Note (Signed)
 Home PPI resumed

## 2023-06-09 NOTE — ED Notes (Signed)
Patient ambulated to restroom with no difficulties

## 2023-06-09 NOTE — Hospital Course (Addendum)
 Hospital course / significant events:   HPI: [name] is an 88 year old female with history of hypertension, hyperlipidemia, neuropathy, GERD, who presents emergency department 04/30 for chief concerns of bright red blood per rectum starting 04/28. She reports this is never happened before. She reports her last colonoscopy was in 2022 and it was normal. She was seen in the emergency room 04/29, dc home w/ follow-up as an outpatient. Dr. Baldomero [facility] was reviewing the patient's lab work 04/30 and suggested the patient come back to the hospital - concern for increased BUN possible upper GI bleed. Also 04/05/23 Hgb 14, and 06/09/23 Hgb 8.9  04/30: admitted to hospitalist service w/ GI consult. Plan EGD/colonoscopy tomorrow.  05/01: EGD small hiatal hernia, non-bleeding duodenal diverticulum, cpli placed on mucosal tear from scope trauma. Colonoscopy diverticulosis, non-bleeding internal hemorrhoids. OK to resume diet. Hgb still dropping some, will recheck in AM, monitor for bleeding  05/02: Hgb still lower, pt this AM reporting L arm pain and headache --> EKG ok, troponin 10, CBC 7.3 down to 6.4 in <4 hours. RN reports bloody stool, frank blood. Transfused PRBC, CTA GI bleed was negative for source, getting nuclear tagged RBC scan as well, vascular aware as FYI.      Consultants:  Gastroenterology   Procedures/Surgeries: 06/10/23 EGD 06/10/23 Colonoscopy       ASSESSMENT & PLAN:   Acute blood loss anemia Secondary to GI bleed S/p EGD and colonoscopy --> see above This morning 05/02: CBC 7.3 early AM down to 6.4 in <4 hours.  RN reports bloody stool, frank blood. Suspect new diverticular bleed  IV PPI NPO for now Monitor CBC and bleeding  Transfused PRBC, monitor HH post transfusion, asked blood bank to hold 2 units ahead  CTA GI bleed was negative for source, so getting nuclear tagged RBC scan as well vascular aware as FYI.   Small hiatal hernia Non-bleeding duodenal  diverticulum Colonic diverticulosis Non-bleeding internal hemorrhoids.  No source bleeding found on EGD/colonoscopy If further bleeding plan CTA/embolization - see above  Dyslipidemia associated with type 2 diabetes mellitus  atorvastatin  40 mg nightly    PAD (peripheral artery disease)  Aspirin  not resumed on admission Consider resume ASA if/when benefits outweigh the risk but certainly holding for now    Essential hypertension Home amlodipine  2.5 mg daily, losartan  50 mg daily resumed on admission       N/a based on BMI: There is no height or weight on file to calculate BMI.. Significantly low or high BMI is associated with higher medical risk.  Underweight - under 18  overweight - 25 to 29 obese - 30 or more Class 1 obesity: BMI of 30.0 to 34 Class 2 obesity: BMI of 35.0 to 39 Class 3 obesity: BMI of 40.0 to 49 Super Morbid Obesity: BMI 50-59 Super-super Morbid Obesity: BMI 60+ Healthy nutrition and physical activity advised as adjunct to other disease management and risk reduction treatments    DVT prophylaxis: no Rx d/t anemia/bleed risk IV fluids: no continuous IV fluids  Nutrition: advance to regular diet  Central lines / other devices: none  Code Status: FULL CODE  ACP documentation reviewed:  none on file in [facility]  TOC needs: TBD Medical barriers to dispo: anemia. Expected medical readiness for discharge pending Hgb / bleeding

## 2023-06-09 NOTE — ED Provider Notes (Signed)
 [facility] Provider Note    Event Date/Time   First MD Initiated Contact with Patient 06/09/23 1158     (approximate)  History   Chief Complaint: Abnormal Lab  HPI  Catherine Burgess is a 88 y.o. female with a past medical history of gastric reflux, hypertension, hyperlipidemia, presents to the emergency department for continued blood loss and abnormal lab work.  Patient was seen in the emergency department yesterday for rectal bleeding.  Had a fairly stable hemoglobin and was discharged home.  However after patient was discharged GI had reviewed the chart and thought the patient should likely be admitted for possible GI bleed.  GI protocol eyes CT was performed yesterday showing no active bleed.  Patient was told to return to the emergency department for further evaluation.  Patient states she continues to note blood/red discoloration to her stool.  Denies any vomiting.  No abdominal pain.  Physical Exam   Triage Vital Signs: ED Triage Vitals  Encounter Vitals Group     BP 06/09/23 1104 (!) 111/57     Systolic BP Percentile --      Diastolic BP Percentile --      Pulse Rate 06/09/23 1104 96     Resp 06/09/23 1104 20     Temp 06/09/23 1104 98.4 F (36.9 C)     Temp Source 06/09/23 1104 Oral     SpO2 06/09/23 1104 98 %     Weight --      Height --      Head Circumference --      Peak Flow --      Pain Score 06/09/23 1105 0     Pain Loc --      Pain Education --      Exclude from Growth Chart --     Most recent vital signs: Vitals:   06/09/23 1200 06/09/23 1215  BP: (!) 130/56   Pulse: 80 73  Resp:  15  Temp:    SpO2: 100% 100%    General: Awake, no distress.  CV:  Good peripheral perfusion.  Regular rate and rhythm  Resp:  Normal effort.  Equal breath sounds bilaterally.  Abd:  No distention.  Soft, nontender.  No rebound or guarding. Other:  Rectal examination shows hematochezia   ED Results / Procedures / Treatments   MEDICATIONS  ORDERED IN ED: Medications - No data to display   IMPRESSION / MDM / ASSESSMENT AND PLAN / ED COURSE  I reviewed the triage vital signs and the nursing notes.  Patient's presentation is most consistent with acute presentation with potential threat to life or bodily function.  Patient presents to the emergency department for a active GI bleed with a decreasing hemoglobin.  Patient's hemoglobin today is 8.9 down from 10.8 yesterday.  Rectal examination shows hematochezia.  We will discuss with GI medicine.  Will admit to the hospital service for further workup and treatment.  Remainder of the lab work is pending.  As the patient had a CTA of the abdomen yesterday I do not believe repeat CT imaging is warranted at this time.  I spoke to Dr.Wohl of GI medicine who will be seeing the patient.  Will admit to the hospital service for further workup and treatment.  Chemistry is reassuring.  FINAL CLINICAL IMPRESSION(S) / ED DIAGNOSES   GI bleed    Note:  This document was prepared using Dragon voice recognition software and may include unintentional dictation errors.   Havier Deeb,  Ernestina Headland, MD 06/09/23 1300

## 2023-06-09 NOTE — Telephone Encounter (Signed)
 Hey Dr Vicenta Graft, I was just reviewing her chart for an update and saw that her hemoglobin was low as well as BUN/creatinine is elevated. I'm concerned if she has an upper G.I. bleed. Repeat hemoglobin was further down to 9.8. I think she should have been admitted for further work up because of her age and concern for upper GI bleed  8 mins RV Her hemoglobin dropped from 14 two months ago to 9.4 which is quite significant   Rohini Janora Merck, MD  Prentice Brochure , please call patient and let her know that she should go to the hospital and get admitted to evaluate for bleeding. I don't have any opening tomorrow for upper endoscopy as outpatient.  I spoke to pt and she will be heading back to the ED at around 10am

## 2023-06-09 NOTE — Assessment & Plan Note (Signed)
Home atorvastatin 40 mg nightly resumed

## 2023-06-09 NOTE — Assessment & Plan Note (Signed)
 Home amlodipine  2.5 mg daily, losartan  50 mg daily resumed on admission

## 2023-06-09 NOTE — Consult Note (Signed)
 Marnee Sink, MD [facility]  [address], [address] [place], [place] [address] Phone: [phone] Fax : [phone]  Consultation  Referring Provider:     Dr. Azalee Bolds Primary Care Physician:  Sowles, Krichna, MD Primary Gastroenterologist:  Dr. Baldomero Bone         Reason for Consultation:     GI bleeding  Date of Admission:  06/09/2023 Date of Consultation:  06/09/2023         HPI:   Catherine Burgess is a 88 y.o. female who was seen in the emergency room yesterday for possible GI bleed and was discharged.  The patient was then told to follow-up as an outpatient.  Dr. Baldomero Bone was reviewing the patient's lab work and suggested the patient come back to the hospital ER due to bleeding.  The patient's labs have shown:  Component     Latest Ref Rng 11/18/2022 04/05/2023 06/08/2023 06/09/2023  Hemoglobin     12.0 - 15.0 g/dL 33.8  25.0  9.4 (L)  8.9 (L)   Hemoglobin        10.8 (L)    Hemoglobin        10.6 (L)     Because the patient's BUN baseline was 16 and it had gone up to 24 Dr. Baldomero Bone had notified the emergency room physician that this patient may be suffering from a upper GI bleed.  Patient had a colonoscopy in 2022 with a poor prep but diverticulosis without any sign of any active GI bleeding seen. The patient reports that her rectal bleeding started on Monday.  She states it was bright red blood without any abdominal pain.  She does state that she has had dark stools since taking Pepto-Bismol but she knows that that can happen when she takes the Pepto-Bismol.  Past Medical History:  Diagnosis Date   Degenerative joint disease    GERD (gastroesophageal reflux disease)    Hyperlipidemia    Hypertension    Malignant essential hypertension 07/23/2016   Osteoarthrosis    Reflux esophagitis     Past Surgical History:  Procedure Laterality Date   CATARACT EXTRACTION, BILATERAL     COLONOSCOPY WITH PROPOFOL  N/A 06/20/2020   Procedure: COLONOSCOPY WITH PROPOFOL ;  Surgeon: Selena Daily, MD;  Location: [facility] ENDOSCOPY;  Service: Gastroenterology;  Laterality: N/A;   EYE SURGERY     HIP PINNING Left 1956   JOINT REPLACEMENT     REPLACEMENT TOTAL KNEE Left 11/12/2009   TOTAL KNEE REVISION Left 12/07/2016   Procedure: TOTAL KNEE REVISION;  Surgeon: Arlyne Lame, MD;  Location: [facility] ORS;  Service: Orthopedics;  Laterality: Left;    Prior to Admission medications   Medication Sig Start Date End Date Taking? Authorizing Provider  acetaminophen  (TYLENOL ) 500 MG tablet Take 1 tablet (500 mg total) by mouth 2 (two) times daily. Patient taking differently: Take 500 mg by mouth every 6 (six) hours as needed for mild pain (pain score 1-3). 10/28/15   Sowles, Krichna, MD  amLODipine  (NORVASC ) 2.5 MG tablet Take 1 tablet (2.5 mg total) by mouth daily. 05/21/23   Sowles, Krichna, MD  aspirin  EC 81 MG tablet Take 81 mg by mouth.    [provider]  atorvastatin  (LIPITOR) 40 MG tablet Take 1 tablet (40 mg total) by mouth daily. TAKE 1 TABLET(40 MG) BY MOUTH DAILY 05/21/23   Sowles, Krichna, MD  Calcium -Vitamin D  600-200 MG-UNIT tablet Take 1 tablet by mouth daily.     [provider]  fluticasone (FLONASE) 50 MCG/ACT nasal spray Place into the nose. 06/16/21   [provider]  gabapentin  (NEURONTIN ) 300 MG capsule Take 1 capsule (300 mg total) by mouth every morning AND 2 capsules (600 mg total) at bedtime. 05/21/23 06/20/23  Sowles, Krichna, MD  losartan  (COZAAR ) 50 MG tablet Take 1 tablet (50 mg total) by mouth daily. 05/21/23   Sowles, Krichna, MD  pantoprazole  (PROTONIX ) 40 MG tablet Take 1 tablet (40 mg total) by mouth daily. 05/21/23   Sowles, Krichna, MD  tiZANidine  (ZANAFLEX ) 2 MG tablet Take 1 tablet (2 mg total) by mouth at bedtime. PRN 05/21/23   Sowles, Krichna, MD    Family History  Problem Relation Age of Onset   Emphysema Father        Smoker   CAD Mother    Stroke Mother    Diabetes Brother    Seizures Brother    Cancer Brother         unknown   Diabetes Brother      Social History   Tobacco Use   Smoking status: Former    Current packs/day: 0.00    Average packs/day: 0.3 packs/day for 1 year (0.3 ttl pk-yrs)    Types: Cigarettes    Start date: 78    Quit date: 1966    Years since quitting: 59.3   Smokeless tobacco: Never   Tobacco comments:    smoking cessation materials not required  Vaping Use   Vaping status: Never Used  Substance Use Topics   Alcohol use: No    Alcohol/week: 0.0 standard drinks of alcohol   Drug use: No    Allergies as of 06/09/2023 - Review Complete 06/09/2023  Allergen Reaction Noted   Lovastatin Other (See Comments) 07/31/2014   Pravastatin  03/28/2015   Rosuvastatin  07/31/2014   Statins  07/31/2014    Review of Systems:    All systems reviewed and negative except where noted in HPI.   Physical Exam:  Vital signs in last 24 hours: Temp:  [98.4 F (36.9 C)-98.5 F (36.9 C)] 98.4 F (36.9 C) (04/30 1104) Pulse Rate:  [67-96] 74 (04/30 1230) Resp:  [15-20] 20 (04/30 1230) BP: (105-130)/(50-73) 105/50 (04/30 1230) SpO2:  [97 %-100 %] 100 % (04/30 1230) Weight:  [62.2 kg] 62.2 kg (04/29 1632)   General:   Pleasant, cooperative in NAD Head:  Normocephalic and atraumatic. Eyes:   No icterus.   Conjunctiva pink. PERRLA. Ears:  Normal auditory acuity. Neck:  Supple; no masses or thyroidomegaly Lungs: Respirations even and unlabored. Lungs clear to auscultation bilaterally.   No wheezes, crackles, or rhonchi.  Heart:  Regular rate and rhythm;  Without murmur, clicks, rubs or gallops Abdomen:  Soft, nondistended, nontender. Normal bowel sounds. No appreciable masses or hepatomegaly.  No rebound or guarding.  Rectal:  Not performed. Msk:  Symmetrical without gross deformities.   Extremities:  Without edema, cyanosis or clubbing. Neurologic:  Alert and oriented x3;  grossly normal neurologically. Skin:  Intact without significant lesions or rashes. Cervical Nodes:  No  significant cervical adenopathy. Psych:  Alert and cooperative. Normal affect.  LAB RESULTS: Recent Labs    06/08/23 1635 06/08/23 2223 06/09/23 1106  WBC 7.1 7.1 7.2  HGB 10.8* 9.4* 8.9*  HCT 34.4* 29.2* 27.9*  PLT 307 260 297   BMET Recent Labs    06/08/23 1635 06/09/23 1106  NA 140 141  K 3.5 3.4*  CL 103 108  CO2 24 24  GLUCOSE  151* 125*  BUN 24* 25*  CREATININE 0.80 0.86  CALCIUM  8.7* 7.8*   LFT Recent Labs    06/09/23 1106  PROT 5.5*  ALBUMIN 2.9*  AST 19  ALT 16  ALKPHOS 83  BILITOT 0.5   PT/INR No results for input(s): "LABPROT", "INR" in the last 72 hours.  STUDIES: CT ANGIO GI BLEED Result Date: 06/08/2023 CLINICAL DATA:  Abdominal pain. Gastrointestinal bleeding. Rectal bleeding. Rectal bleeding starting last night. Bright red blood in stool. Mid abdominal pain. EXAM: CTA ABDOMEN AND PELVIS WITHOUT AND WITH CONTRAST TECHNIQUE: Multidetector CT imaging of the abdomen and pelvis was performed using the standard protocol during bolus administration of intravenous contrast. Multiplanar reconstructed images and MIPs were obtained and reviewed to evaluate the vascular anatomy. RADIATION DOSE REDUCTION: This exam was performed according to the departmental dose-optimization program which includes automated exposure control, adjustment of the mA and/or kV according to patient size and/or use of iterative reconstruction technique. CONTRAST:  OMNIPAQUE IOHEXOL 350 MG/ML SOLN COMPARISON:  01/24/2017 FINDINGS: VASCULAR Aorta: Unenhanced images demonstrate no significant aortic calcification. Arterial phase contrast-enhanced images demonstrate normal caliber abdominal aorta. No dissection. No significant stenosis. Celiac: Patent without evidence of aneurysm, dissection, vasculitis or significant stenosis. SMA: Patent without evidence of aneurysm, dissection, vasculitis or significant stenosis. Renals: Both renal arteries are patent without evidence of aneurysm,  dissection, vasculitis, fibromuscular dysplasia or significant stenosis. IMA: Patent without evidence of aneurysm, dissection, vasculitis or significant stenosis. Inflow: Patent without evidence of aneurysm, dissection, vasculitis or significant stenosis. Calcification of the common iliac arteries. Proximal Outflow: Bilateral common femoral and visualized portions of the superficial and profunda femoral arteries are patent without evidence of aneurysm, dissection, vasculitis or significant stenosis. Veins: No obvious venous abnormality within the limitations of this arterial phase study. Review of the MIP images confirms the above findings. NON-VASCULAR Lower chest: Slight fibrosis in the lung bases. Small esophageal hiatal hernia. Hepatobiliary: No focal liver abnormality is seen. No gallstones, gallbladder wall thickening, or biliary dilatation. Pancreas: Unremarkable. No pancreatic ductal dilatation or surrounding inflammatory changes. Spleen: Normal in size without focal abnormality. Adrenals/Urinary Tract: Adrenal glands are unremarkable. Kidneys are normal, without renal calculi, focal lesion, or hydronephrosis. Bladder is unremarkable. Stomach/Bowel: Stomach, small bowel, and colon are not abnormally distended. There is evidence of gastric fold thickening with hyperemia which may indicate gastritis. Focal nonspecific luminal narrowing and wall thickening at the splenic flexure. This could be physiologic contraction but may indicate an underlying neoplasm. Consider colonoscopic evaluation if clinically indicated. Colonic diverticulosis without evidence of acute diverticulitis. Appendix is normal. No evidence of arterial or delayed phase intraluminal contrast extravasation. No radiographic findings to indicate any areas of active gastrointestinal hemorrhage. Lymphatic: No significant lymphadenopathy. Reproductive: Large calcified masses in the uterus consistent with uterine fibroids. No abnormal adnexal masses.  Other: No free air or free fluid in the abdomen. Abdominal wall musculature appears intact. Musculoskeletal: Degenerative changes in the spine. Postoperative changes in the left hip. No acute bony abnormalities. IMPRESSION: 1. Mild iliac atherosclerosis. 2. No large vessel occlusion, aneurysm, or dissection. 3. No radiographic evidence of any focal site of active gastrointestinal bleeding. 4. Gastric fold thickening and hyperemia may indicate gastritis. 5. Focal area of luminal narrowing and wall thickening of the splenic flexure of the colon could be physiologic but may indicate underlying neoplasm. Consider endoscopic evaluation if clinically indicated. 6. Colonic diverticulosis without evidence of acute diverticulitis. 7. Large uterine fibroids. 8. Small esophageal hiatal hernia. Electronically Signed   By: Sammie Crigler  Florie Husband M.D.   On: 06/08/2023 21:30      Impression / Plan:   Assessment: Active Problems:   * No active hospital problems. *   Catherine Burgess is a 88 y.o. y/o female with acute anemia of blood loss with a hemoglobin of 9.4 yesterday and 8.9 today with her baseline back in February 2014.  The patient takes Tylenol  but denies any NSAID use.  She also reports the blood to be bright red in color.  Plan:  The patient will be set up for a EGD and colonoscopy for tomorrow.  The patient will be kept on a clear liquid diet today and made n.p.o. after midnight for an EGD and colonoscopy.  The patient will also be given a prep to clean herself out for the colonoscopy.  The patient has been explained the plan and agrees with it.  PPI IV twice daily  Continue serial CBCs and transfuse PRN Avoid NSAIDs Maintain 2 large-bore IV lines Please page GI with any acute hemodynamic changes, or signs of active GI bleeding   Thank you for involving me in the care of this patient.      LOS: 0 days   Marnee Sink, MD, MD. Sylvan Evener 06/09/2023, 1:01 PM,  Pager [phone] 7am-5pm  Check AMION for  5pm -7am coverage and on weekends   Note: This dictation was prepared with Dragon dictation along with smaller phrase technology. Any transcriptional errors that result from this process are unintentional.

## 2023-06-09 NOTE — H&P (Signed)
 History and Physical   NALAYSIA Burgess UEA:[id number] DOB: Dec 26, 1935 DOA: 06/09/2023  PCP: Burgess, Krichna, MD  Outpatient Specialists: Dr. Beau Bound, cardiology Patient coming from: pov  I have personally briefly reviewed patient's old medical records in [facility] EMR.  Chief Concern: rectal bleeding  HPI: [name] is an 88 year old female with history of hypertension, hyperlipidemia, neuropathy, GERD, who presents emergency department for chief concerns of bright red blood per rectum.  Vitals in the ED showed temperature of 98.4, respiration rate of 20, heart rate 80, blood pressure 130/56, SpO2 100% on room air.  Serum sodium is 141, potassium 3.4, chloride 108, bicarb 24, BUN of 25, serum creatinine 0.86, EGFR greater than 60, nonfasting blood glucose 125, WBC 7.2, hemoglobin 8.9, platelets of 293.  ED treatment: None.  GI service was consulted by EDP. ---------------------------------- At bedside, patient was able to tell me her first and last name, age, location, current calendar year.  Patient reports she started having bright red blood per rectum on Monday.  She reports this is never happened before.  She reports her last colonoscopy was in 2022 and it was normal.  She denies history of NSAID use including ibuprofen, Motrin, BC powder, Goody powder. She denies EtOH use.  Social history: She lives at home on her own.  She denies tobacco, EtOH, recreational drug use.  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  ROS: Constitutional: no weight change, no fever ENT/Mouth: no sore throat, no rhinorrhea Eyes: no eye pain, no vision changes Cardiovascular: no chest pain, no dyspnea,  no edema, no palpitations Respiratory: no cough, no sputum, no wheezing Gastrointestinal: no nausea, no vomiting, no diarrhea, no constipation, + bright red blood per rectum Genitourinary: no urinary incontinence, no dysuria, no  hematuria Musculoskeletal: no arthralgias, no myalgias Skin: no skin lesions, no pruritus, Neuro: + weakness, no loss of consciousness, no syncope Psych: no anxiety, no depression, + decrease appetite Heme/Lymph: no bruising, no bleeding  ED Course: Discussed with EDP, patient requiring hospitalization for chief concerns of lower GI bleed.  Assessment/Plan  Principal Problem:   Acute blood loss anemia Active Problems:   GERD without esophagitis   Essential hypertension   H/O total knee replacement   Osteopenia of multiple sites   History of CVA (cerebrovascular accident) without residual deficits   PAD (peripheral artery disease) (HCC)   Peripheral polyneuropathy   Dyslipidemia associated with type 2 diabetes mellitus (HCC)   Assessment and Plan:  * Acute blood loss anemia Suspect secondary to lower GI bleed Clear liquid diet ordered on admission Gastroenterology has been consulted N.p.o. after midnight GoLytely  per GI recommendation for esophagastroduodenoscopy in the a.m.  Dyslipidemia associated with type 2 diabetes mellitus (HCC) Home atorvastatin  40 mg nightly resumed  PAD (peripheral artery disease) (HCC) Aspirin  not resumed on admission AM team to resume when the benefits outweigh the risk  Essential hypertension Home amlodipine  2.5 mg daily, losartan  50 mg daily resumed on admission  GERD without esophagitis Home PPI resumed  Chart reviewed.   DVT prophylaxis: TED hose; AM team to initiate pharmacologic DVT prophylaxis when the benefits outweigh the risk Code Status: Full code Diet: Clear liquid diet; n.p.o. after midnight Family Communication: Updated son, Mont Antis at bedside with patient's permission Disposition Plan: Pending clinical course Consults called: Gastroenterology Admission status: PCU, inpatient  Past Medical History:  Diagnosis Date   Degenerative joint disease    GERD (gastroesophageal reflux disease)    Hyperlipidemia  Hypertension     Malignant essential hypertension 07/23/2016   Osteoarthrosis    Reflux esophagitis    Past Surgical History:  Procedure Laterality Date   CATARACT EXTRACTION, BILATERAL     COLONOSCOPY WITH PROPOFOL  N/A 06/20/2020   Procedure: COLONOSCOPY WITH PROPOFOL ;  Surgeon: Selena Daily, MD;  Location: [facility] ENDOSCOPY;  Service: Gastroenterology;  Laterality: N/A;   EYE SURGERY     HIP PINNING Left 1956   JOINT REPLACEMENT     REPLACEMENT TOTAL KNEE Left 11/12/2009   TOTAL KNEE REVISION Left 12/07/2016   Procedure: TOTAL KNEE REVISION;  Surgeon: Arlyne Lame, MD;  Location: [facility] ORS;  Service: Orthopedics;  Laterality: Left;   Social History:  reports that she quit smoking about 59 years ago. Her smoking use included cigarettes. She started smoking about 60 years ago. She has a 0.3 pack-year smoking history. She has never used smokeless tobacco. She reports that she does not drink alcohol and does not use drugs.  Allergies  Allergen Reactions   Lovastatin Other (See Comments)    chest   Pravastatin     Other reaction(s): Muscle Pain chest   Rosuvastatin     Other reaction(s): Muscle Pain chest Other reaction(s): Muscle Pain chest   Statins     Other reaction(s): Muscle Pain chest   Family History  Problem Relation Age of Onset   Emphysema Father        Smoker   CAD Mother    Stroke Mother    Diabetes Brother    Seizures Brother    Cancer Brother        unknown   Diabetes Brother    Family history: Family history reviewed and not pertinent.  Prior to Admission medications   Medication Sig Start Date End Date Taking? Authorizing Provider  acetaminophen  (TYLENOL ) 500 MG tablet Take 1 tablet (500 mg total) by mouth 2 (two) times daily. Patient taking differently: Take 500 mg by mouth every 6 (six) hours as needed for mild pain (pain score 1-3). 10/28/15   Burgess, Krichna, MD  amLODipine  (NORVASC ) 2.5 MG tablet Take 1 tablet (2.5 mg total) by mouth daily. 05/21/23    Burgess, Krichna, MD  aspirin  EC 81 MG tablet Take 81 mg by mouth.    [provider]  atorvastatin  (LIPITOR) 40 MG tablet Take 1 tablet (40 mg total) by mouth daily. TAKE 1 TABLET(40 MG) BY MOUTH DAILY 05/21/23   Burgess, Krichna, MD  Calcium -Vitamin D  600-200 MG-UNIT tablet Take 1 tablet by mouth daily.     [provider]  fluticasone (FLONASE) 50 MCG/ACT nasal spray Place into the nose. 06/16/21   [provider]  gabapentin  (NEURONTIN ) 300 MG capsule Take 1 capsule (300 mg total) by mouth every morning AND 2 capsules (600 mg total) at bedtime. 05/21/23 06/20/23  Burgess, Krichna, MD  losartan  (COZAAR ) 50 MG tablet Take 1 tablet (50 mg total) by mouth daily. 05/21/23   Burgess, Krichna, MD  pantoprazole  (PROTONIX ) 40 MG tablet Take 1 tablet (40 mg total) by mouth daily. 05/21/23   Burgess, Krichna, MD  tiZANidine  (ZANAFLEX ) 2 MG tablet Take 1 tablet (2 mg total) by mouth at bedtime. PRN 05/21/23   Burgess, Krichna, MD   Physical Exam: Vitals:   06/09/23 1200 06/09/23 1215 06/09/23 1230 06/09/23 1300  BP: (!) 130/56  (!) 105/50 (!) 108/53  Pulse: 80 73 74 72  Resp:  15 20 17   Temp:      TempSrc:  SpO2: 100% 100% 100% 100%   Constitutional: appears age-appropriate, NAD, calm Eyes: PERRL, lids and conjunctivae normal ENMT: Mucous membranes are moist. Posterior pharynx clear of any exudate or lesions. Age-appropriate dentition. Hearing appropriate Neck: normal, supple, no masses, no thyromegaly Respiratory: clear to auscultation bilaterally, no wheezing, no crackles. Normal respiratory effort. No accessory muscle use.  Cardiovascular: Regular rate and rhythm, no murmurs / rubs / gallops. No extremity edema. 2+ pedal pulses. No carotid bruits.  Abdomen: no tenderness, no masses palpated, no hepatosplenomegaly. Bowel sounds positive.  Musculoskeletal: no clubbing / cyanosis. No joint deformity upper and lower extremities. Good ROM, no contractures, no atrophy. Normal muscle  tone.  Skin: no rashes, lesions, ulcers. No induration Neurologic: Sensation intact. Strength 5/5 in all 4.  Psychiatric: Normal judgment and insight. Alert and oriented x 3. Normal mood.   EKG: Ordered on admission pending completion at this time  Chest x-ray on Admission: not indicated at this time  CT ANGIO GI BLEED Result Date: 06/08/2023 CLINICAL DATA:  Abdominal pain. Gastrointestinal bleeding. Rectal bleeding. Rectal bleeding starting last night. Bright red blood in stool. Mid abdominal pain. EXAM: CTA ABDOMEN AND PELVIS WITHOUT AND WITH CONTRAST TECHNIQUE: Multidetector CT imaging of the abdomen and pelvis was performed using the standard protocol during bolus administration of intravenous contrast. Multiplanar reconstructed images and MIPs were obtained and reviewed to evaluate the vascular anatomy. RADIATION DOSE REDUCTION: This exam was performed according to the departmental dose-optimization program which includes automated exposure control, adjustment of the mA and/or kV according to patient size and/or use of iterative reconstruction technique. CONTRAST:  OMNIPAQUE IOHEXOL 350 MG/ML SOLN COMPARISON:  01/24/2017 FINDINGS: VASCULAR Aorta: Unenhanced images demonstrate no significant aortic calcification. Arterial phase contrast-enhanced images demonstrate normal caliber abdominal aorta. No dissection. No significant stenosis. Celiac: Patent without evidence of aneurysm, dissection, vasculitis or significant stenosis. SMA: Patent without evidence of aneurysm, dissection, vasculitis or significant stenosis. Renals: Both renal arteries are patent without evidence of aneurysm, dissection, vasculitis, fibromuscular dysplasia or significant stenosis. IMA: Patent without evidence of aneurysm, dissection, vasculitis or significant stenosis. Inflow: Patent without evidence of aneurysm, dissection, vasculitis or significant stenosis. Calcification of the common iliac arteries. Proximal Outflow:  Bilateral common femoral and visualized portions of the superficial and profunda femoral arteries are patent without evidence of aneurysm, dissection, vasculitis or significant stenosis. Veins: No obvious venous abnormality within the limitations of this arterial phase study. Review of the MIP images confirms the above findings. NON-VASCULAR Lower chest: Slight fibrosis in the lung bases. Small esophageal hiatal hernia. Hepatobiliary: No focal liver abnormality is seen. No gallstones, gallbladder wall thickening, or biliary dilatation. Pancreas: Unremarkable. No pancreatic ductal dilatation or surrounding inflammatory changes. Spleen: Normal in size without focal abnormality. Adrenals/Urinary Tract: Adrenal glands are unremarkable. Kidneys are normal, without renal calculi, focal lesion, or hydronephrosis. Bladder is unremarkable. Stomach/Bowel: Stomach, small bowel, and colon are not abnormally distended. There is evidence of gastric fold thickening with hyperemia which may indicate gastritis. Focal nonspecific luminal narrowing and wall thickening at the splenic flexure. This could be physiologic contraction but may indicate an underlying neoplasm. Consider colonoscopic evaluation if clinically indicated. Colonic diverticulosis without evidence of acute diverticulitis. Appendix is normal. No evidence of arterial or delayed phase intraluminal contrast extravasation. No radiographic findings to indicate any areas of active gastrointestinal hemorrhage. Lymphatic: No significant lymphadenopathy. Reproductive: Large calcified masses in the uterus consistent with uterine fibroids. No abnormal adnexal masses. Other: No free air or free fluid in the abdomen. Abdominal wall  musculature appears intact. Musculoskeletal: Degenerative changes in the spine. Postoperative changes in the left hip. No acute bony abnormalities. IMPRESSION: 1. Mild iliac atherosclerosis. 2. No large vessel occlusion, aneurysm, or dissection. 3. No  radiographic evidence of any focal site of active gastrointestinal bleeding. 4. Gastric fold thickening and hyperemia may indicate gastritis. 5. Focal area of luminal narrowing and wall thickening of the splenic flexure of the colon could be physiologic but may indicate underlying neoplasm. Consider endoscopic evaluation if clinically indicated. 6. Colonic diverticulosis without evidence of acute diverticulitis. 7. Large uterine fibroids. 8. Small esophageal hiatal hernia. Electronically Signed   By: Boyce Byes M.D.   On: 06/08/2023 21:30   Labs on Admission: I have personally reviewed following labs  CBC: Recent Labs  Lab 06/08/23 1426 06/08/23 1635 06/08/23 2223 06/09/23 1106  WBC 8.0 7.1 7.1 7.2  NEUTROABS 4.8  --   --   --   HGB 10.6* 10.8* 9.4* 8.9*  HCT 32.9* 34.4* 29.2* 27.9*  MCV 88.2 88.9 86.9 88.9  PLT 289 307 260 297   Basic Metabolic Panel: Recent Labs  Lab 06/08/23 1635 06/09/23 1106  NA 140 141  K 3.5 3.4*  CL 103 108  CO2 24 24  GLUCOSE 151* 125*  BUN 24* 25*  CREATININE 0.80 0.86  CALCIUM  8.7* 7.8*   GFR: Estimated Creatinine Clearance: 39.6 mL/min (by C-G formula based on SCr of 0.86 mg/dL).  Liver Function Tests: Recent Labs  Lab 06/08/23 1635 06/09/23 1106  AST 20 19  ALT 16 16  ALKPHOS 96 83  BILITOT 0.9 0.5  PROT 6.0* 5.5*  ALBUMIN 3.3* 2.9*   Urine analysis:    Component Value Date/Time   COLORURINE YELLOW (A) 06/13/2017 2253   APPEARANCEUR HAZY (A) 06/13/2017 2253   APPEARANCEUR Clear 01/22/2014 1210   LABSPEC 1.016 06/13/2017 2253   LABSPEC 1.012 01/22/2014 1210   PHURINE 5.0 06/13/2017 2253   GLUCOSEU NEGATIVE 06/13/2017 2253   GLUCOSEU Negative 01/22/2014 1210   HGBUR NEGATIVE 06/13/2017 2253   BILIRUBINUR NEGATIVE 06/13/2017 2253   BILIRUBINUR negative 12/17/2015 1558   BILIRUBINUR Negative 01/22/2014 1210   KETONESUR NEGATIVE 06/13/2017 2253   PROTEINUR NEGATIVE 06/13/2017 2253   UROBILINOGEN negative 12/17/2015 1558    NITRITE NEGATIVE 06/13/2017 2253   LEUKOCYTESUR TRACE (A) 06/13/2017 2253   LEUKOCYTESUR Trace 01/22/2014 1210   This document was prepared using Dragon Voice Recognition software and may include unintentional dictation errors.  Dr. Reinhold Carbine [facility]  If 7PM-7AM, please contact overnight-coverage provider If 7AM-7PM, please contact day attending provider [email or web address]  06/09/2023, 2:42 PM

## 2023-06-09 NOTE — ED Notes (Signed)
 Patient completed bowel prep drink

## 2023-06-09 NOTE — ED Triage Notes (Signed)
 Pt to ED via POV from home. Pt reports was called by PCP and referred to come to Ed due to HBG still dropping. PT reports some rectal bleeding and intermittent abd pain. Pt seen on 4/29 for same. Pt denies dizziness.

## 2023-06-10 ENCOUNTER — Encounter: Payer: Self-pay | Admitting: Internal Medicine

## 2023-06-10 ENCOUNTER — Inpatient Hospital Stay: Admitting: Anesthesiology

## 2023-06-10 ENCOUNTER — Other Ambulatory Visit: Payer: Self-pay

## 2023-06-10 ENCOUNTER — Encounter
Admission: EM | Disposition: A | Discharge status: Home / Self Care | Payer: Self-pay | Source: Home / Self Care | Attending: Osteopathic Medicine

## 2023-06-10 DIAGNOSIS — D509 Iron deficiency anemia, unspecified: Secondary | ICD-10-CM

## 2023-06-10 DIAGNOSIS — K640 First degree hemorrhoids: Secondary | ICD-10-CM | POA: Diagnosis not present

## 2023-06-10 DIAGNOSIS — K5730 Diverticulosis of large intestine without perforation or abscess without bleeding: Secondary | ICD-10-CM

## 2023-06-10 DIAGNOSIS — K449 Diaphragmatic hernia without obstruction or gangrene: Secondary | ICD-10-CM

## 2023-06-10 DIAGNOSIS — K922 Gastrointestinal hemorrhage, unspecified: Secondary | ICD-10-CM

## 2023-06-10 DIAGNOSIS — D62 Acute posthemorrhagic anemia: Secondary | ICD-10-CM | POA: Diagnosis not present

## 2023-06-10 DIAGNOSIS — K5710 Diverticulosis of small intestine without perforation or abscess without bleeding: Secondary | ICD-10-CM

## 2023-06-10 HISTORY — PX: COLONOSCOPY: SHX5424

## 2023-06-10 HISTORY — PX: ESOPHAGOGASTRODUODENOSCOPY: SHX5428

## 2023-06-10 HISTORY — DX: Gastrointestinal hemorrhage, unspecified: K92.2

## 2023-06-10 LAB — CBC
HCT: 23.9 % — ABNORMAL LOW (ref 36.0–46.0)
Hemoglobin: 7.7 g/dL — ABNORMAL LOW (ref 12.0–15.0)
MCH: 28.1 pg (ref 26.0–34.0)
MCHC: 32.2 g/dL (ref 30.0–36.0)
MCV: 87.2 fL (ref 80.0–100.0)
Platelets: 225 10*3/uL (ref 150–400)
RBC: 2.74 MIL/uL — ABNORMAL LOW (ref 3.87–5.11)
RDW: 15 % (ref 11.5–15.5)
WBC: 5.5 10*3/uL (ref 4.0–10.5)
nRBC: 0 % (ref 0.0–0.2)

## 2023-06-10 LAB — BASIC METABOLIC PANEL WITH GFR
Anion gap: 5 (ref 5–15)
BUN: 15 mg/dL (ref 8–23)
CO2: 25 mmol/L (ref 22–32)
Calcium: 7.9 mg/dL — ABNORMAL LOW (ref 8.9–10.3)
Chloride: 112 mmol/L — ABNORMAL HIGH (ref 98–111)
Creatinine, Ser: 0.6 mg/dL (ref 0.44–1.00)
GFR, Estimated: >60 mL/min (ref >60)
Glucose, Bld: 100 mg/dL — ABNORMAL HIGH (ref 70–99)
Potassium: 3.5 mmol/L (ref 3.5–5.1)
Sodium: 142 mmol/L (ref 135–145)

## 2023-06-10 SURGERY — EGD (ESOPHAGOGASTRODUODENOSCOPY)
Anesthesia: General

## 2023-06-10 MED ORDER — PROPOFOL 10 MG/ML IV BOLUS
INTRAVENOUS | Status: AC
  Filled 2023-06-10: qty 20

## 2023-06-10 MED ORDER — PROPOFOL 500 MG/50ML IV EMUL
INTRAVENOUS | Status: DC | PRN
  Administered 2023-06-10: 50 ug/kg/min via INTRAVENOUS

## 2023-06-10 MED ORDER — SODIUM CHLORIDE 0.9 % IV SOLN: INTRAVENOUS | Status: DC | PRN

## 2023-06-10 MED ORDER — LIDOCAINE HCL (CARDIAC) PF 100 MG/5ML IV SOSY
PREFILLED_SYRINGE | INTRAVENOUS | Status: DC | PRN
Start: 2023-06-10 — End: 2023-06-10
  Administered 2023-06-10: 50 mg via INTRAVENOUS

## 2023-06-10 MED ORDER — LIDOCAINE HCL (PF) 2 % IJ SOLN
INTRAMUSCULAR | Status: AC
  Filled 2023-06-10: qty 10

## 2023-06-10 MED ORDER — PROPOFOL 10 MG/ML IV BOLUS
INTRAVENOUS | Status: DC | PRN
  Administered 2023-06-10: 20 mg via INTRAVENOUS
  Administered 2023-06-10: 40 mg via INTRAVENOUS
  Administered 2023-06-10 (×3): 20 mg via INTRAVENOUS

## 2023-06-10 NOTE — ED Notes (Signed)
 This RN gave report to Fredricka Jenny and performed care handoff. Call light in reach, bed wheels locked, side rails raised, pt updated on plan of care.

## 2023-06-10 NOTE — Transfer of Care (Signed)
 Immediate Anesthesia Transfer of Care Note  Patient: Catherine Burgess  Procedure(s) Performed: EGD (ESOPHAGOGASTRODUODENOSCOPY) COLONOSCOPY  Patient Location: PACU and Endoscopy Unit  Anesthesia Type:General  Level of Consciousness: sedated  Airway & Oxygen Therapy: Patient Spontanous Breathing  Post-op Assessment: Report given to RN and Post -op Vital signs reviewed and stable  Post vital signs: Reviewed and stable  Last Vitals:  Vitals Value Taken Time  BP 113/56 06/10/23 1330  Temp 35.7 C 06/10/23 1329  Pulse 78 06/10/23 1330  Resp 17 06/10/23 1330  SpO2 100 % 06/10/23 1330  Vitals shown include unfiled device data.  Last Pain:  Vitals:   06/10/23 1329  TempSrc: Temporal  PainSc: Asleep         Complications: No notable events documented.

## 2023-06-10 NOTE — Op Note (Signed)
 [facility] Gastroenterology Patient Name: Ahni Grunke Procedure Date: 06/10/2023 12:53 PM MRN: [id number] Account #: [id number] Date of Birth: 08-27-1935 Admit Type: Inpatient Age: 88 Room: [facility] ENDO ROOM 1 Gender: Female Note Status: Finalized Instrument Name: Charlyn Cooley [id number] Procedure:             Colonoscopy Indications:           Hematochezia Providers:             Marnee Sink MD, MD Medicines:             Propofol  per Anesthesia Complications:         No immediate complications. Procedure:             Pre-Anesthesia Assessment:                        - Prior to the procedure, a History and Physical was                         performed, and patient medications and allergies were                         reviewed. The patient's tolerance of previous                         anesthesia was also reviewed. The risks and benefits                         of the procedure and the sedation options and risks                         were discussed with the patient. All questions were                         answered, and informed consent was obtained. Prior                         Anticoagulants: The patient has taken no anticoagulant                         or antiplatelet agents. ASA Grade Assessment: II - A                         patient with mild systemic disease. After reviewing                         the risks and benefits, the patient was deemed in                         satisfactory condition to undergo the procedure.                        After obtaining informed consent, the colonoscope was                         passed under direct vision. Throughout the procedure,                         the patient's blood pressure, pulse, and oxygen  saturations were monitored continuously. The                         Colonoscope was introduced through the anus and                         advanced to the the terminal ileum. The  colonoscopy                         was performed without difficulty. The patient                         tolerated the procedure well. The quality of the bowel                         preparation was excellent. Findings:      The perianal and digital rectal examinations were normal.      The terminal ileum appeared normal.      Many small-mouthed diverticula were found in the entire colon.      Non-bleeding internal hemorrhoids were found during retroflexion. The       hemorrhoids were Grade I (internal hemorrhoids that do not prolapse). Impression:            - The examined portion of the ileum was normal.                        - Diverticulosis in the entire examined colon.                        - Non-bleeding internal hemorrhoids.                        - No specimens collected.                        - More pink fluid seen in the left colon then anywhere                         else in the colon. Recommendation:        - Return patient to hospital ward for ongoing care.                        - Resume previous diet.                        - Continue present medications.                        - If any further bleeding then contact vascular                         surgery for embolization. Procedure Code(s):     --- Professional ---                        [phone], Colonoscopy, flexible; diagnostic, including                         collection of specimen(s) by brushing or washing, when  performed (separate procedure) Diagnosis Code(s):     --- Professional ---                        K92.1, Melena (includes Hematochezia) CPT copyright 2022 [employer]. All rights reserved. The codes documented in this report are preliminary and upon coder review may  be revised to meet current compliance requirements. Marnee Sink MD, MD 06/10/2023 1:28:09 PM This report has been signed electronically. Number of Addenda: 0 Note Initiated On: 06/10/2023 12:53  PM Scope Withdrawal Time: 0 hours 5 minutes 48 seconds  Total Procedure Duration: 0 hours 8 minutes 56 seconds  Estimated Blood Loss:  Estimated blood loss: none.      [facility]

## 2023-06-10 NOTE — Progress Notes (Signed)
 PROGRESS NOTE    Catherine Burgess   GNF:[id number] DOB: 1935/11/01  DOA: 06/09/2023 Date of Service: 06/10/23 which is hospital day 1  PCP: Sowles, Krichna, MD    Hospital course / significant events:   HPI: Catherine Burgess is an 88 year old female with history of hypertension, hyperlipidemia, neuropathy, GERD, who presents emergency department 04/30 for chief concerns of bright red blood per rectum starting 04/28. She reports this is never happened before. She reports her last colonoscopy was in 2022 and it was normal. She was seen in the emergency room 04/29, dc home w/ follow-up as an outpatient. Dr. Baldomero [facility] was reviewing the patient's lab work 04/30 and suggested the patient come back to the hospital - concern for increased BUN possible upper GI bleed. Also 04/05/23 Hgb 14, and 06/09/23 Hgb 8.9  04/30: admitted to hospitalist service w/ GI consult. Plan EGD/colonoscopy tomorrow.  05/01: EGD small hiatal hernia, non-bleeding duodenal diverticulum, cpli placed on mucosal tear from scope trauma. Colonoscopy diverticulosis, non-bleeding internal hemorrhoids. OK to resume diet. Hgb still dropping some, will recheck in AM, monitor for bleeding      Consultants:  Gastroenterology   Procedures/Surgeries: 06/10/23 EGD 06/10/23 Colonoscopy       ASSESSMENT & PLAN:   Acute blood loss anemia Suspect secondary to GI bleed S/p EGD and colonoscopy --> see above IV PPI CLD --> soft diet  Monitor CBC and bleeding   Small hiatal hernia Non-bleeding duodenal diverticulum Colonic diverticulosis Non-bleeding internal hemorrhoids.  No source bleeding OK to resume diet If further bleeding plan CTA/embolization  Dyslipidemia associated with type 2 diabetes mellitus  Home atorvastatin  40 mg nightly resumed   PAD (peripheral artery disease)  Aspirin  not resumed on admission Consider resume ASA if/when benefits outweigh the risk   Essential hypertension Home amlodipine  2.5  mg daily, losartan  50 mg daily resumed on admission       N/a based on BMI: There is no height or weight on file to calculate BMI.. Significantly low or high BMI is associated with higher medical risk.  Underweight - under 18  overweight - 25 to 29 obese - 30 or more Class 1 obesity: BMI of 30.0 to 34 Class 2 obesity: BMI of 35.0 to 39 Class 3 obesity: BMI of 40.0 to 49 Super Morbid Obesity: BMI 50-59 Super-super Morbid Obesity: BMI 60+ Healthy nutrition and physical activity advised as adjunct to other disease management and risk reduction treatments    DVT prophylaxis: no Rx d/t anemia/bleed risk IV fluids: no continuous IV fluids  Nutrition: advance to regular diet  Central lines / other devices: none  Code Status: FULL CODE  ACP documentation reviewed:  none on file in [facility]  TOC needs: TBD Medical barriers to dispo: anemia. Expected medical readiness for discharge possibly tomorrow unless hgb dropping.              Subjective / Brief ROS:  Patient reports feeling well following procedure  Denies CP/SOB.  Pain controlled.  Denies new weakness.  No diet yet  Reports no concerns w/ urination/defecation.   Family Communication: none at this time pt decliend call     Objective Findings:  Vitals:   06/10/23 1330 06/10/23 1349 06/10/23 1418 06/10/23 1617  BP: (!) 113/56  (!) 146/57 (!) 137/59  Pulse: 78  68 74  Resp: 14     Temp:   97.8 F (36.6 C) 98.7 F (37.1 C)  TempSrc:      SpO2: 100% 100% 99% 98%  Intake/Output Summary (Last 24 hours) at 06/10/2023 1640 Last data filed at 06/10/2023 1507 Gross per 24 hour  Intake 1400 ml  Output --  Net 1400 ml   There were no vitals filed for this visit.  Examination:  Physical Exam Constitutional:      General: She is not in acute distress.    Appearance: She is not ill-appearing.  Cardiovascular:     Rate and Rhythm: Normal rate and regular rhythm.  Pulmonary:     Effort: Pulmonary effort is  normal.     Breath sounds: Normal breath sounds.  Abdominal:     General: Abdomen is flat.     Palpations: Abdomen is soft.  Neurological:     Mental Status: She is alert.  Psychiatric:        Mood and Affect: Mood normal.        Behavior: Behavior normal.          Scheduled Medications:   amLODipine   2.5 mg Oral Daily   atorvastatin   40 mg Oral QHS   calcium -vitamin D   1 tablet Oral Daily   gabapentin   300 mg Oral Daily   And   gabapentin   600 mg Oral QHS   losartan   50 mg Oral Daily   pantoprazole   40 mg Oral Daily    Continuous Infusions:    PRN Medications:  acetaminophen  **OR** acetaminophen , fluticasone , ondansetron  **OR** ondansetron  (ZOFRAN ) IV, senna-docusate  Antimicrobials from admission:  Anti-infectives (From admission, onward)    None           Data Reviewed:  I have personally reviewed the following...  CBC: Recent Labs  Lab 06/08/23 1426 06/08/23 1635 06/08/23 2223 06/09/23 1106 06/10/23 0417  WBC 8.0 7.1 7.1 7.2 5.5  NEUTROABS 4.8  --   --   --   --   HGB 10.6* 10.8* 9.4* 8.9* 7.7*  HCT 32.9* 34.4* 29.2* 27.9* 23.9*  MCV 88.2 88.9 86.9 88.9 87.2  PLT 289 307 260 297 225   Basic Metabolic Panel: Recent Labs  Lab 06/08/23 1635 06/09/23 1106 06/10/23 0417  NA 140 141 142  K 3.5 3.4* 3.5  CL 103 108 112*  CO2 24 24 25   GLUCOSE 151* 125* 100*  BUN 24* 25* 15  CREATININE 0.80 0.86 0.60  CALCIUM  8.7* 7.8* 7.9*  MG  --  2.0  --    GFR: Estimated Creatinine Clearance: 42.5 mL/min (by C-G formula based on SCr of 0.6 mg/dL). Liver Function Tests: Recent Labs  Lab 06/08/23 1635 06/09/23 1106  AST 20 19  ALT 16 16  ALKPHOS 96 83  BILITOT 0.9 0.5  PROT 6.0* 5.5*  ALBUMIN 3.3* 2.9*   No results for input(s): "LIPASE", "AMYLASE" in the last 168 hours. No results for input(s): "AMMONIA" in the last 168 hours. Coagulation Profile: No results for input(s): "INR", "PROTIME" in the last 168 hours. Cardiac Enzymes: No  results for input(s): "CKTOTAL", "CKMB", "CKMBINDEX", "TROPONINI" in the last 168 hours. BNP (last 3 results) No results for input(s): "PROBNP" in the last 8760 hours. HbA1C: No results for input(s): "HGBA1C" in the last 72 hours. CBG: No results for input(s): "GLUCAP" in the last 168 hours. Lipid Profile: No results for input(s): "CHOL", "HDL", "LDLCALC", "TRIG", "CHOLHDL", "LDLDIRECT" in the last 72 hours. Thyroid  Function Tests: No results for input(s): "TSH", "T4TOTAL", "FREET4", "T3FREE", "THYROIDAB" in the last 72 hours. Anemia Panel: No results for input(s): "VITAMINB12", "FOLATE", "FERRITIN", "TIBC", "IRON", "RETICCTPCT" in the last 72 hours. Most Recent  Urinalysis On File:     Component Value Date/Time   COLORURINE YELLOW (A) 06/13/2017 2253   APPEARANCEUR HAZY (A) 06/13/2017 2253   APPEARANCEUR Clear 01/22/2014 1210   LABSPEC 1.016 06/13/2017 2253   LABSPEC 1.012 01/22/2014 1210   PHURINE 5.0 06/13/2017 2253   GLUCOSEU NEGATIVE 06/13/2017 2253   GLUCOSEU Negative 01/22/2014 1210   HGBUR NEGATIVE 06/13/2017 2253   BILIRUBINUR NEGATIVE 06/13/2017 2253   BILIRUBINUR negative 12/17/2015 1558   BILIRUBINUR Negative 01/22/2014 1210   KETONESUR NEGATIVE 06/13/2017 2253   PROTEINUR NEGATIVE 06/13/2017 2253   UROBILINOGEN negative 12/17/2015 1558   NITRITE NEGATIVE 06/13/2017 2253   LEUKOCYTESUR TRACE (A) 06/13/2017 2253   LEUKOCYTESUR Trace 01/22/2014 1210   Sepsis Labs: @LABRCNTIP (procalcitonin:4,lacticidven:4) Microbiology: No results found for this or any previous visit (from the past 240 hours).    Radiology Studies last 3 days: CT ANGIO GI BLEED Result Date: 06/08/2023 CLINICAL DATA:  Abdominal pain. Gastrointestinal bleeding. Rectal bleeding. Rectal bleeding starting last night. Bright red blood in stool. Mid abdominal pain. EXAM: CTA ABDOMEN AND PELVIS WITHOUT AND WITH CONTRAST TECHNIQUE: Multidetector CT imaging of the abdomen and pelvis was performed using the  standard protocol during bolus administration of intravenous contrast. Multiplanar reconstructed images and MIPs were obtained and reviewed to evaluate the vascular anatomy. RADIATION DOSE REDUCTION: This exam was performed according to the departmental dose-optimization program which includes automated exposure control, adjustment of the mA and/or kV according to patient size and/or use of iterative reconstruction technique. CONTRAST:  OMNIPAQUE  IOHEXOL  350 MG/ML SOLN COMPARISON:  01/24/2017 FINDINGS: VASCULAR Aorta: Unenhanced images demonstrate no significant aortic calcification. Arterial phase contrast-enhanced images demonstrate normal caliber abdominal aorta. No dissection. No significant stenosis. Celiac: Patent without evidence of aneurysm, dissection, vasculitis or significant stenosis. SMA: Patent without evidence of aneurysm, dissection, vasculitis or significant stenosis. Renals: Both renal arteries are patent without evidence of aneurysm, dissection, vasculitis, fibromuscular dysplasia or significant stenosis. IMA: Patent without evidence of aneurysm, dissection, vasculitis or significant stenosis. Inflow: Patent without evidence of aneurysm, dissection, vasculitis or significant stenosis. Calcification of the common iliac arteries. Proximal Outflow: Bilateral common femoral and visualized portions of the superficial and profunda femoral arteries are patent without evidence of aneurysm, dissection, vasculitis or significant stenosis. Veins: No obvious venous abnormality within the limitations of this arterial phase study. Review of the MIP images confirms the above findings. NON-VASCULAR Lower chest: Slight fibrosis in the lung bases. Small esophageal hiatal hernia. Hepatobiliary: No focal liver abnormality is seen. No gallstones, gallbladder wall thickening, or biliary dilatation. Pancreas: Unremarkable. No pancreatic ductal dilatation or surrounding inflammatory changes. Spleen: Normal in size  without focal abnormality. Adrenals/Urinary Tract: Adrenal glands are unremarkable. Kidneys are normal, without renal calculi, focal lesion, or hydronephrosis. Bladder is unremarkable. Stomach/Bowel: Stomach, small bowel, and colon are not abnormally distended. There is evidence of gastric fold thickening with hyperemia which may indicate gastritis. Focal nonspecific luminal narrowing and wall thickening at the splenic flexure. This could be physiologic contraction but may indicate an underlying neoplasm. Consider colonoscopic evaluation if clinically indicated. Colonic diverticulosis without evidence of acute diverticulitis. Appendix is normal. No evidence of arterial or delayed phase intraluminal contrast extravasation. No radiographic findings to indicate any areas of active gastrointestinal hemorrhage. Lymphatic: No significant lymphadenopathy. Reproductive: Large calcified masses in the uterus consistent with uterine fibroids. No abnormal adnexal masses. Other: No free air or free fluid in the abdomen. Abdominal wall musculature appears intact. Musculoskeletal: Degenerative changes in the spine. Postoperative changes in the left hip.  No acute bony abnormalities. IMPRESSION: 1. Mild iliac atherosclerosis. 2. No large vessel occlusion, aneurysm, or dissection. 3. No radiographic evidence of any focal site of active gastrointestinal bleeding. 4. Gastric fold thickening and hyperemia may indicate gastritis. 5. Focal area of luminal narrowing and wall thickening of the splenic flexure of the colon could be physiologic but may indicate underlying neoplasm. Consider endoscopic evaluation if clinically indicated. 6. Colonic diverticulosis without evidence of acute diverticulitis. 7. Large uterine fibroids. 8. Small esophageal hiatal hernia. Electronically Signed   By: Boyce Byes M.D.   On: 06/08/2023 21:30         Phoebie Shad, DO [facility] 06/10/2023, 4:40 PM    Dictation software may  have been used to generate the above note. Typos may occur and escape review in typed/dictated notes. Please contact Dr Authur Leghorn directly for clarity if needed.  Staff may message me via secure chat in Epic  but this may not receive an immediate response,  please page me for urgent matters!  If 7PM-7AM, please contact night coverage [email or web address]

## 2023-06-10 NOTE — Op Note (Signed)
 [facility] Gastroenterology Patient Name: Catherine Burgess Procedure Date: 06/10/2023 12:54 PM MRN: [id number] Account #: [id number] Date of Birth: 31-Dec-1935 Admit Type: Inpatient Age: 88 Room: [facility] ENDO ROOM 4 Gender: Female Note Status: Finalized Instrument Name: Upper Endoscope [id number] Procedure:             Upper GI endoscopy Indications:           Iron deficiency anemia Providers:             Marnee Sink MD, MD Referring MD:          Lavonna Prader. Sowles, MD (Referring MD) Medicines:             Propofol  per Anesthesia Complications:         No immediate complications. Procedure:             Pre-Anesthesia Assessment:                        - Prior to the procedure, a History and Physical was                         performed, and patient medications and allergies were                         reviewed. The patient's tolerance of previous                         anesthesia was also reviewed. The risks and benefits                         of the procedure and the sedation options and risks                         were discussed with the patient. All questions were                         answered, and informed consent was obtained. Prior                         Anticoagulants: The patient has taken no anticoagulant                         or antiplatelet agents. ASA Grade Assessment: II - A                         patient with mild systemic disease. After reviewing                         the risks and benefits, the patient was deemed in                         satisfactory condition to undergo the procedure.                        After obtaining informed consent, the endoscope was                         passed under direct vision. Throughout the procedure,  the patient's blood pressure, pulse, and oxygen                         saturations were monitored continuously. The                         Endosonoscope was introduced through the  mouth, and                         advanced to the fourth part of duodenum. The upper GI                         endoscopy was accomplished without difficulty. The                         patient tolerated the procedure well. Findings:      A small hiatal hernia was present.      The entire examined stomach was normal.      A non-bleeding diverticulum was found in the third portion of the       duodenum and in the fourth portion of the duodenum.      A clip was placed on a mucosal tear from scope trauma, To close a defect       after polypectomy, one hemostatic clip was successfully placed (MR       conditional). Clip manufacturer: [employer]. There was no       bleeding at the end of the procedure. Impression:            - Small hiatal hernia.                        - Normal stomach.                        - Non-bleeding duodenal diverticulum.                        - A clip was placed on a mucosal tear from scope trauma                        - No specimens collected. Recommendation:        - Return patient to hospital ward for ongoing care.                        - Resume previous diet.                        - Continue present medications.                        - Perform a colonoscopy today. Procedure Code(s):     --- Professional ---                        [phone], Esophagogastroduodenoscopy, flexible,                         transoral; diagnostic, including collection of                         specimen(s) by brushing or washing,  when performed                         (separate procedure) Diagnosis Code(s):     --- Professional ---                        D50.9, Iron deficiency anemia, unspecified CPT copyright 2022 American Medical Association. All rights reserved. The codes documented in this report are preliminary and upon coder review may  be revised to meet current compliance requirements. Marnee Sink MD, MD 06/10/2023 1:14:01 PM This report has been signed  electronically. Number of Addenda: 0 Note Initiated On: 06/10/2023 12:54 PM Estimated Blood Loss:  Estimated blood loss: none.      [facility]

## 2023-06-10 NOTE — Anesthesia Preprocedure Evaluation (Signed)
 Anesthesia Evaluation  Patient identified by MRN, date of birth, ID band Patient awake    Reviewed: Allergy & Precautions, NPO status , Patient's Chart, lab work & pertinent test results  History of Anesthesia Complications Negative for: history of anesthetic complications  Airway Mallampati: III  TM Distance: <3 FB Neck ROM: full    Dental  (+) Upper Dentures, Lower Dentures   Pulmonary neg shortness of breath, former smoker   Pulmonary exam normal        Cardiovascular Exercise Tolerance: Good hypertension, (-) angina Normal cardiovascular exam     Neuro/Psych  Neuromuscular disease  negative psych ROS   GI/Hepatic Neg liver ROS,GERD  Controlled,,  Endo/Other  diabetes    Renal/GU negative Renal ROS  negative genitourinary   Musculoskeletal   Abdominal   Peds  Hematology negative hematology ROS (+)   Anesthesia Other Findings Patient is NPO appropriate and reports no nausea or vomiting today.   Past Medical History: No date: Degenerative joint disease No date: GERD (gastroesophageal reflux disease) No date: Hyperlipidemia No date: Hypertension 07/23/2016: Malignant essential hypertension No date: Osteoarthrosis No date: Reflux esophagitis  Past Surgical History: No date: CATARACT EXTRACTION, BILATERAL 06/20/2020: COLONOSCOPY WITH PROPOFOL ; N/A     Comment:  Procedure: COLONOSCOPY WITH PROPOFOL ;  Surgeon: Selena Daily, MD;  Location: [facility] ENDOSCOPY;  Service:               Gastroenterology;  Laterality: N/A; No date: EYE SURGERY 1956: HIP PINNING; Left No date: JOINT REPLACEMENT 11/12/2009: REPLACEMENT TOTAL KNEE; Left 12/07/2016: TOTAL KNEE REVISION; Left     Comment:  Procedure: TOTAL KNEE REVISION;  Surgeon: Arlyne Lame, MD;  Location: [facility] ORS;  Service: Orthopedics;                Laterality: Left;     Reproductive/Obstetrics negative OB ROS                              Anesthesia Physical Anesthesia Plan  ASA: 3  Anesthesia Plan: General   Post-op Pain Management:    Induction: Intravenous  PONV Risk Score and Plan: Propofol  infusion and TIVA  Airway Management Planned: Natural Airway and Nasal Cannula  Additional Equipment:   Intra-op Plan:   Post-operative Plan:   Informed Consent: I have reviewed the patients History and Physical, chart, labs and discussed the procedure including the risks, benefits and alternatives for the proposed anesthesia with the patient or authorized representative who has indicated his/her understanding and acceptance.     Dental Advisory Given  Plan Discussed with: Anesthesiologist, CRNA and Surgeon  Anesthesia Plan Comments: (Patient consented for risks of anesthesia including but not limited to:  - adverse reactions to medications - risk of airway placement if required - damage to eyes, teeth, lips or other oral mucosa - nerve damage due to positioning  - sore throat or hoarseness - Damage to heart, brain, nerves, lungs, other parts of body or loss of life  Patient voiced understanding and assent.)       Anesthesia Quick Evaluation

## 2023-06-11 ENCOUNTER — Inpatient Hospital Stay

## 2023-06-11 DIAGNOSIS — D62 Acute posthemorrhagic anemia: Secondary | ICD-10-CM | POA: Diagnosis not present

## 2023-06-11 LAB — CBC
HCT: 23.2 % — ABNORMAL LOW (ref 36.0–46.0)
HCT: 20.2 % — ABNORMAL LOW (ref 36.0–46.0)
Hemoglobin: 7.3 g/dL — ABNORMAL LOW (ref 12.0–15.0)
Hemoglobin: 6.4 g/dL — ABNORMAL LOW (ref 12.0–15.0)
MCH: 27.5 pg (ref 26.0–34.0)
MCH: 28.1 pg (ref 26.0–34.0)
MCHC: 31.5 g/dL (ref 30.0–36.0)
MCHC: 31.7 g/dL (ref 30.0–36.0)
MCV: 87.5 fL (ref 80.0–100.0)
MCV: 88.6 fL (ref 80.0–100.0)
Platelets: 239 10*3/uL (ref 150–400)
Platelets: 260 10*3/uL (ref 150–400)
RBC: 2.65 MIL/uL — ABNORMAL LOW (ref 3.87–5.11)
RBC: 2.28 MIL/uL — ABNORMAL LOW (ref 3.87–5.11)
RDW: 15.1 % (ref 11.5–15.5)
RDW: 15 % (ref 11.5–15.5)
WBC: 6.2 10*3/uL (ref 4.0–10.5)
WBC: 9.3 10*3/uL (ref 4.0–10.5)
nRBC: 0 % (ref 0.0–0.2)
nRBC: 0 % (ref 0.0–0.2)

## 2023-06-11 LAB — BASIC METABOLIC PANEL WITH GFR
Anion gap: 11 (ref 5–15)
BUN: 37 mg/dL — ABNORMAL HIGH (ref 8–23)
CO2: 27 mmol/L (ref 22–32)
Calcium: 9.6 mg/dL (ref 8.9–10.3)
Chloride: 104 mmol/L (ref 98–111)
Creatinine, Ser: 0.75 mg/dL (ref 0.44–1.00)
GFR, Estimated: >60 mL/min (ref >60)
Glucose, Bld: 113 mg/dL — ABNORMAL HIGH (ref 70–99)
Potassium: 3.3 mmol/L — ABNORMAL LOW (ref 3.5–5.1)
Sodium: 142 mmol/L (ref 135–145)

## 2023-06-11 LAB — TROPONIN I (HIGH SENSITIVITY): Troponin I (High Sensitivity): 10 ng/L (ref <18)

## 2023-06-11 LAB — HEMOGLOBIN AND HEMATOCRIT, BLOOD
HCT: 25.2 % — ABNORMAL LOW (ref 36.0–46.0)
Hemoglobin: 8.4 g/dL — ABNORMAL LOW (ref 12.0–15.0)

## 2023-06-11 LAB — PREPARE RBC (CROSSMATCH)

## 2023-06-11 MED ORDER — TECHNETIUM TC 99M-LABELED RED BLOOD CELLS IV KIT
22.8300 | PACK | Freq: Once | INTRAVENOUS | Status: AC | PRN
  Administered 2023-06-11: 22.83 via INTRAVENOUS

## 2023-06-11 MED ORDER — IOHEXOL 350 MG/ML SOLN
100.0000 mL | Freq: Once | INTRAVENOUS | Status: AC | PRN
  Administered 2023-06-11: 100 mL via INTRAVENOUS

## 2023-06-11 MED ORDER — SODIUM CHLORIDE 0.9% IV SOLUTION: Freq: Once | INTRAVENOUS | Status: DC

## 2023-06-11 MED ORDER — POTASSIUM CHLORIDE CRYS ER 20 MEQ PO TBCR
40.0000 meq | EXTENDED_RELEASE_TABLET | Freq: Once | ORAL | Status: AC
  Administered 2023-06-11: 40 meq via ORAL
  Filled 2023-06-11: qty 2

## 2023-06-11 NOTE — Anesthesia Postprocedure Evaluation (Signed)
 Anesthesia Post Note  Patient: Catherine Burgess  Procedure(s) Performed: EGD (ESOPHAGOGASTRODUODENOSCOPY) COLONOSCOPY  Patient location during evaluation: Endoscopy Anesthesia Type: General Level of consciousness: awake and alert Pain management: pain level controlled Vital Signs Assessment: post-procedure vital signs reviewed and stable Respiratory status: spontaneous breathing, nonlabored ventilation, respiratory function stable and patient connected to nasal cannula oxygen Cardiovascular status: blood pressure returned to baseline and stable Postop Assessment: no apparent nausea or vomiting Anesthetic complications: no   No notable events documented.   Last Vitals:  Vitals:   06/10/23 2347 06/11/23 0602  BP: (!) 139/56 (!) 130/57  Pulse: 73 78  Resp: 18 15  Temp: 36.9 C 37.1 C  SpO2: 100% 98%    Last Pain:  Vitals:   06/11/23 0602  TempSrc: Oral  PainSc:                  Portia Brittle Wafaa Deemer

## 2023-06-11 NOTE — TOC Progression Note (Signed)
 Transition of Care [facility]) - Progression Note    Patient Details  Name: Catherine Burgess MRN: [id number] Date of Birth: 01-28-1936  [facility] CM/SW Contact  Zoe Hinds, RN Phone Number: 06/11/2023, 2:17 PM  Clinical Narrative:    HOD#2 pt remains not medically stable to dc today.H/H still dropping some, MD will recheck AM, monitor for bleeding. TOC Will cont to follow dc planning / care coordination needs while pt is hospitalization.        Expected Discharge Plan and Services                                               Social Determinants of Health (SDOH) Interventions SDOH Screenings   Food Insecurity: No Food Insecurity (06/09/2023)  Housing: Low Risk  (06/09/2023)  Transportation Needs: No Transportation Needs (06/09/2023)  Utilities: Not At Risk (06/09/2023)  Alcohol Screen: Low Risk  (09/04/2022)  Depression (PHQ2-9): Low Risk  (04/12/2023)  Financial Resource Strain: Low Risk  (03/19/2023)   Received from [facility]  Physical Activity: Inactive (09/04/2022)  Social Connections: Moderately Integrated (06/09/2023)  Stress: No Stress Concern Present (09/04/2022)  Tobacco Use: Medium Risk (06/10/2023)  Health Literacy: Adequate Health Literacy (09/04/2022)    Readmission Risk Interventions     No data to display

## 2023-06-11 NOTE — Progress Notes (Signed)
 PROGRESS NOTE    Catherine Burgess   NUU:[id number] DOB: January 26, 1936  DOA: 06/09/2023 Date of Service: 06/11/23 which is hospital day 2  PCP: Sowles, Krichna, MD    Hospital course / significant events:   HPI: [name] is an 88 year old female with history of hypertension, hyperlipidemia, neuropathy, GERD, who presents emergency department 04/30 for chief concerns of bright red blood per rectum starting 04/28. She reports this is never happened before. She reports her last colonoscopy was in 2022 and it was normal. She was seen in the emergency room 04/29, dc home w/ follow-up as an outpatient. Dr. Baldomero [facility] was reviewing the patient's lab work 04/30 and suggested the patient come back to the hospital - concern for increased BUN possible upper GI bleed. Also 04/05/23 Hgb 14, and 06/09/23 Hgb 8.9  04/30: admitted to hospitalist service w/ GI consult. Plan EGD/colonoscopy tomorrow.  05/01: EGD small hiatal hernia, non-bleeding duodenal diverticulum, cpli placed on mucosal tear from scope trauma. Colonoscopy diverticulosis, non-bleeding internal hemorrhoids. OK to resume diet. Hgb still dropping some, will recheck in AM, monitor for bleeding  05/02: Hgb still lower, pt this AM reporting L arm pain and headache --> EKG ok, troponin 10, CBC 7.3 down to 6.4 in <4 hours. RN reports bloody stool, frank blood. Transfused PRBC, CTA GI bleed was negative for source, getting nuclear tagged RBC scan as well, vascular aware as FYI.      Consultants:  Gastroenterology   Procedures/Surgeries: 06/10/23 EGD 06/10/23 Colonoscopy       ASSESSMENT & PLAN:   Acute blood loss anemia Secondary to GI bleed S/p EGD and colonoscopy --> see above This morning 05/02: CBC 7.3 early AM down to 6.4 in <4 hours.  RN reports bloody stool, frank blood. Suspect new diverticular bleed  IV PPI NPO for now Monitor CBC and bleeding  Transfused PRBC, monitor HH post transfusion, asked blood bank to  hold 2 units ahead  CTA GI bleed was negative for source, so getting nuclear tagged RBC scan as well vascular aware as FYI.   Small hiatal hernia Non-bleeding duodenal diverticulum Colonic diverticulosis Non-bleeding internal hemorrhoids.  No source bleeding found on EGD/colonoscopy If further bleeding plan CTA/embolization - see above  Dyslipidemia associated with type 2 diabetes mellitus  atorvastatin  40 mg nightly    PAD (peripheral artery disease)  Aspirin  not resumed on admission Consider resume ASA if/when benefits outweigh the risk but certainly holding for now    Essential hypertension Home amlodipine  2.5 mg daily, losartan  50 mg daily resumed on admission       N/a based on BMI: There is no height or weight on file to calculate BMI.. Significantly low or high BMI is associated with higher medical risk.  Underweight - under 18  overweight - 25 to 29 obese - 30 or more Class 1 obesity: BMI of 30.0 to 34 Class 2 obesity: BMI of 35.0 to 39 Class 3 obesity: BMI of 40.0 to 49 Super Morbid Obesity: BMI 50-59 Super-super Morbid Obesity: BMI 60+ Healthy nutrition and physical activity advised as adjunct to other disease management and risk reduction treatments    DVT prophylaxis: no Rx d/t anemia/bleed risk IV fluids: no continuous IV fluids  Nutrition: advance to regular diet  Central lines / other devices: none  Code Status: FULL CODE  ACP documentation reviewed:  none on file in [facility]  TOC needs: TBD Medical barriers to dispo: anemia. Expected medical readiness for discharge pending Hgb / bleeding  Subjective / Brief ROS:  Patient reports feeling weak and sweaty this morning, reports arm pain but characterizes as her usual shoulder arthritis no chest pain SHe feels better after receiveing about half unit PRBC Denies CP/SOB.  Pain controlled.  Denies new weakness.  No diet yet  Reports no concerns w/ urination/defecation.   Family  Communication: family at bedside on morning rounds and spoke w/ son this afternoon at bedside as well     Objective Findings:  Vitals:   06/11/23 1149 06/11/23 1149 06/11/23 1210 06/11/23 1421  BP:  (!) 134/54 (!) 114/55 (!) 135/58  Pulse: 86 87 84 85  Resp:  18 15 16   Temp: 97.9 F (36.6 C) 97.9 F (36.6 C) 97.8 F (36.6 C) 98.2 F (36.8 C)  TempSrc: Oral  Oral Oral  SpO2:  100% 100% 100%    Intake/Output Summary (Last 24 hours) at 06/11/2023 1455 Last data filed at 06/11/2023 1419 Gross per 24 hour  Intake 1575 ml  Output --  Net 1575 ml   There were no vitals filed for this visit.  Examination:   Physical Exam Constitutional:      General: She is not in acute distress.    Appearance: She is not ill-appearing.     Comments: This morning looked pale, improved this afternoon  Cardiovascular:     Rate and Rhythm: Normal rate and regular rhythm.  Pulmonary:     Effort: Pulmonary effort is normal.     Breath sounds: Normal breath sounds.  Abdominal:     General: Abdomen is flat.     Palpations: Abdomen is soft.  Neurological:     Mental Status: She is alert.  Psychiatric:        Mood and Affect: Mood normal.        Behavior: Behavior normal.          Scheduled Medications:   sodium chloride    Intravenous Once   amLODipine   2.5 mg Oral Daily   atorvastatin   40 mg Oral QHS   calcium -vitamin D   1 tablet Oral Daily   gabapentin   300 mg Oral Daily   And   gabapentin   600 mg Oral QHS   losartan   50 mg Oral Daily   pantoprazole   40 mg Oral Daily   potassium chloride   40 mEq Oral Once    Continuous Infusions:    PRN Medications:  acetaminophen  **OR** acetaminophen , fluticasone , ondansetron  **OR** ondansetron  (ZOFRAN ) IV, senna-docusate  Antimicrobials from admission:  Anti-infectives (From admission, onward)    None           Data Reviewed:  I have personally reviewed the following...  CBC: Recent Labs  Lab 06/08/23 1426 06/08/23 1635  06/08/23 2223 06/09/23 1106 06/10/23 0417 06/11/23 0437 06/11/23 0804  WBC 8.0   < > 7.1 7.2 5.5 6.2 9.3  NEUTROABS 4.8  --   --   --   --   --   --   HGB 10.6*   < > 9.4* 8.9* 7.7* 7.3* 6.4*  HCT 32.9*   < > 29.2* 27.9* 23.9* 23.2* 20.2*  MCV 88.2   < > 86.9 88.9 87.2 87.5 88.6  PLT 289   < > 260 297 225 239 260   < > = values in this interval not displayed.   Basic Metabolic Panel: Recent Labs  Lab 06/08/23 1635 06/09/23 1106 06/10/23 0417 06/11/23 0804  NA 140 141 142 142  K 3.5 3.4* 3.5 3.3*  CL 103 108 112*  104  CO2 24 24 25 27   GLUCOSE 151* 125* 100* 113*  BUN 24* 25* 15 37*  CREATININE 0.80 0.86 0.60 0.75  CALCIUM  8.7* 7.8* 7.9* 9.6  MG  --  2.0  --   --    GFR: Estimated Creatinine Clearance: 42.5 mL/min (by C-G formula based on SCr of 0.75 mg/dL). Liver Function Tests: Recent Labs  Lab 06/08/23 1635 06/09/23 1106  AST 20 19  ALT 16 16  ALKPHOS 96 83  BILITOT 0.9 0.5  PROT 6.0* 5.5*  ALBUMIN 3.3* 2.9*   No results for input(s): "LIPASE", "AMYLASE" in the last 168 hours. No results for input(s): "AMMONIA" in the last 168 hours. Coagulation Profile: No results for input(s): "INR", "PROTIME" in the last 168 hours. Cardiac Enzymes: No results for input(s): "CKTOTAL", "CKMB", "CKMBINDEX", "TROPONINI" in the last 168 hours. BNP (last 3 results) No results for input(s): "PROBNP" in the last 8760 hours. HbA1C: No results for input(s): "HGBA1C" in the last 72 hours. CBG: No results for input(s): "GLUCAP" in the last 168 hours. Lipid Profile: No results for input(s): "CHOL", "HDL", "LDLCALC", "TRIG", "CHOLHDL", "LDLDIRECT" in the last 72 hours. Thyroid  Function Tests: No results for input(s): "TSH", "T4TOTAL", "FREET4", "T3FREE", "THYROIDAB" in the last 72 hours. Anemia Panel: No results for input(s): "VITAMINB12", "FOLATE", "FERRITIN", "TIBC", "IRON", "RETICCTPCT" in the last 72 hours. Most Recent Urinalysis On File:     Component Value Date/Time    COLORURINE YELLOW (A) 06/13/2017 2253   APPEARANCEUR HAZY (A) 06/13/2017 2253   APPEARANCEUR Clear 01/22/2014 1210   LABSPEC 1.016 06/13/2017 2253   LABSPEC 1.012 01/22/2014 1210   PHURINE 5.0 06/13/2017 2253   GLUCOSEU NEGATIVE 06/13/2017 2253   GLUCOSEU Negative 01/22/2014 1210   HGBUR NEGATIVE 06/13/2017 2253   BILIRUBINUR NEGATIVE 06/13/2017 2253   BILIRUBINUR negative 12/17/2015 1558   BILIRUBINUR Negative 01/22/2014 1210   KETONESUR NEGATIVE 06/13/2017 2253   PROTEINUR NEGATIVE 06/13/2017 2253   UROBILINOGEN negative 12/17/2015 1558   NITRITE NEGATIVE 06/13/2017 2253   LEUKOCYTESUR TRACE (A) 06/13/2017 2253   LEUKOCYTESUR Trace 01/22/2014 1210   Sepsis Labs: @LABRCNTIP (procalcitonin:4,lacticidven:4) Microbiology: No results found for this or any previous visit (from the past 240 hours).    Radiology Studies last 3 days: CT ANGIO GI BLEED Result Date: 06/11/2023 CLINICAL DATA:  GI bleed. Rectal bleeding and intermittent abdominal pain. Decreasing hemoglobin. EXAM: CTA ABDOMEN AND PELVIS WITHOUT AND WITH CONTRAST TECHNIQUE: Multidetector CT imaging of the abdomen and pelvis was performed using the standard protocol during bolus administration of intravenous contrast. Multiplanar reconstructed images and MIPs were obtained and reviewed to evaluate the vascular anatomy. RADIATION DOSE REDUCTION: This exam was performed according to the departmental dose-optimization program which includes automated exposure control, adjustment of the mA and/or kV according to patient size and/or use of iterative reconstruction technique. CONTRAST:  OMNIPAQUE  IOHEXOL  350 MG/ML SOLN COMPARISON:  CTA GI bleed from 06/08/2023. FINDINGS: VASCULAR Aorta: Normal caliber aorta without aneurysm, dissection, vasculitis or significant stenosis. Celiac: Patent without evidence of aneurysm, dissection, vasculitis or significant stenosis. SMA: Patent without evidence of aneurysm, dissection, vasculitis or  significant stenosis. Renals: Both renal arteries are patent without evidence of aneurysm, dissection, vasculitis, fibromuscular dysplasia or significant stenosis. IMA: Patent without evidence of aneurysm, dissection, vasculitis or significant stenosis. Inflow: Patent without evidence of aneurysm, dissection, vasculitis or significant stenosis. Proximal Outflow: Bilateral common femoral and visualized portions of the superficial and profunda femoral arteries are patent without evidence of aneurysm, dissection, vasculitis or significant stenosis. Veins:  No obvious venous abnormality within the limitations of this arterial phase study. Review of the MIP images confirms the above findings. NON-VASCULAR Lower chest: The lung bases are clear. No pleural effusion. The heart is normal in size. No pericardial effusion. Hepatobiliary: The liver is normal in size. Non-cirrhotic configuration. No suspicious mass. These is mild diffuse hepatic steatosis. There is mild central intrahepatic and extrahepatic bile duct dilation. However, no choledocholithiasis or obstructing mass seen. No calcified gallstones. Normal gallbladder wall thickness. No pericholecystic inflammatory changes. Pancreas: Unremarkable. No pancreatic ductal dilatation or surrounding inflammatory changes. Spleen: Within normal limits. No focal lesion. Adrenals/Urinary Tract: Adrenal glands are unremarkable. No suspicious renal mass. There is a partially exophytic subcentimeter sized simple cyst arising from the right kidney lower pole, anteriorly. No hydronephrosis. No renal or ureteric calculi. Urinary bladder is under distended, precluding optimal assessment. However, no large mass or stones identified. No perivesical fat stranding. Stomach/Bowel: There is moderate to marked thickening of the fundus, cardia and body of stomach, similar to the prior study. This is incompletely evaluated on the current exam. Findings may be due to underdistention however,  underlying gastritis can also have similar appearance. Correlate clinically to determine the need for endoscopy if not recently performed. There are at least 2 adjacent diverticula arising from the 2/3 part of duodenum. There are multiple additional diverticula in the proximal small bowel loops. No disproportionate dilation of the small or large bowel loops. No evidence of abnormal bowel wall thickening or inflammatory changes. The appendix is unremarkable. There are multiple diverticula throughout the colon, without imaging signs of diverticulitis. No active extravasation of intravenous contrast noted within the bowel loops. Vascular/Lymphatic: No ascites or pneumoperitoneum. No abdominal or pelvic lymphadenopathy, by size criteria. No aneurysmal dilation of the major abdominal arteries. There are mild peripheral atherosclerotic vascular calcifications of the aorta and its major branches. Reproductive: Redemonstration of enlarged/bulky lobulated uterus containing multiple calcified and noncalcified leiomyomas. No large adnexal mass seen. Other: There is a tiny fat containing umbilical hernia. The soft tissues and abdominal wall are otherwise unremarkable. Musculoskeletal: No suspicious osseous lesions. There are mild multilevel degenerative changes in the visualized spine. IMPRESSION: 1. No active extravasation of intravenous contrast to suggest active GI bleeding. 2. Moderate-to-marked gastric thickening, as described above. 3. Multiple other nonacute observations, as described above. Aortic Atherosclerosis (ICD10-I70.0). Electronically Signed   By: Beula Brunswick M.D.   On: 06/11/2023 10:30   CT ANGIO GI BLEED Result Date: 06/08/2023 CLINICAL DATA:  Abdominal pain. Gastrointestinal bleeding. Rectal bleeding. Rectal bleeding starting last night. Bright red blood in stool. Mid abdominal pain. EXAM: CTA ABDOMEN AND PELVIS WITHOUT AND WITH CONTRAST TECHNIQUE: Multidetector CT imaging of the abdomen and pelvis was  performed using the standard protocol during bolus administration of intravenous contrast. Multiplanar reconstructed images and MIPs were obtained and reviewed to evaluate the vascular anatomy. RADIATION DOSE REDUCTION: This exam was performed according to the departmental dose-optimization program which includes automated exposure control, adjustment of the mA and/or kV according to patient size and/or use of iterative reconstruction technique. CONTRAST:  OMNIPAQUE  IOHEXOL  350 MG/ML SOLN COMPARISON:  01/24/2017 FINDINGS: VASCULAR Aorta: Unenhanced images demonstrate no significant aortic calcification. Arterial phase contrast-enhanced images demonstrate normal caliber abdominal aorta. No dissection. No significant stenosis. Celiac: Patent without evidence of aneurysm, dissection, vasculitis or significant stenosis. SMA: Patent without evidence of aneurysm, dissection, vasculitis or significant stenosis. Renals: Both renal arteries are patent without evidence of aneurysm, dissection, vasculitis, fibromuscular dysplasia or significant stenosis. IMA: Patent  without evidence of aneurysm, dissection, vasculitis or significant stenosis. Inflow: Patent without evidence of aneurysm, dissection, vasculitis or significant stenosis. Calcification of the common iliac arteries. Proximal Outflow: Bilateral common femoral and visualized portions of the superficial and profunda femoral arteries are patent without evidence of aneurysm, dissection, vasculitis or significant stenosis. Veins: No obvious venous abnormality within the limitations of this arterial phase study. Review of the MIP images confirms the above findings. NON-VASCULAR Lower chest: Slight fibrosis in the lung bases. Small esophageal hiatal hernia. Hepatobiliary: No focal liver abnormality is seen. No gallstones, gallbladder wall thickening, or biliary dilatation. Pancreas: Unremarkable. No pancreatic ductal dilatation or surrounding inflammatory changes.  Spleen: Normal in size without focal abnormality. Adrenals/Urinary Tract: Adrenal glands are unremarkable. Kidneys are normal, without renal calculi, focal lesion, or hydronephrosis. Bladder is unremarkable. Stomach/Bowel: Stomach, small bowel, and colon are not abnormally distended. There is evidence of gastric fold thickening with hyperemia which may indicate gastritis. Focal nonspecific luminal narrowing and wall thickening at the splenic flexure. This could be physiologic contraction but may indicate an underlying neoplasm. Consider colonoscopic evaluation if clinically indicated. Colonic diverticulosis without evidence of acute diverticulitis. Appendix is normal. No evidence of arterial or delayed phase intraluminal contrast extravasation. No radiographic findings to indicate any areas of active gastrointestinal hemorrhage. Lymphatic: No significant lymphadenopathy. Reproductive: Large calcified masses in the uterus consistent with uterine fibroids. No abnormal adnexal masses. Other: No free air or free fluid in the abdomen. Abdominal wall musculature appears intact. Musculoskeletal: Degenerative changes in the spine. Postoperative changes in the left hip. No acute bony abnormalities. IMPRESSION: 1. Mild iliac atherosclerosis. 2. No large vessel occlusion, aneurysm, or dissection. 3. No radiographic evidence of any focal site of active gastrointestinal bleeding. 4. Gastric fold thickening and hyperemia may indicate gastritis. 5. Focal area of luminal narrowing and wall thickening of the splenic flexure of the colon could be physiologic but may indicate underlying neoplasm. Consider endoscopic evaluation if clinically indicated. 6. Colonic diverticulosis without evidence of acute diverticulitis. 7. Large uterine fibroids. 8. Small esophageal hiatal hernia. Electronically Signed   By: Boyce Byes M.D.   On: 06/08/2023 21:30         Dianne Bady, DO [facility] 06/11/2023, 2:55 PM     Dictation software may have been used to generate the above note. Typos may occur and escape review in typed/dictated notes. Please contact Dr Authur Leghorn directly for clarity if needed.  Staff may message me via secure chat in Epic  but this may not receive an immediate response,  please page me for urgent matters!  If 7PM-7AM, please contact night coverage [email or web address]

## 2023-06-12 DIAGNOSIS — D62 Acute posthemorrhagic anemia: Secondary | ICD-10-CM | POA: Diagnosis not present

## 2023-06-12 LAB — BASIC METABOLIC PANEL WITH GFR
Anion gap: 7 (ref 5–15)
BUN: 19 mg/dL (ref 8–23)
CO2: 28 mmol/L (ref 22–32)
Calcium: 8.5 mg/dL — ABNORMAL LOW (ref 8.9–10.3)
Chloride: 105 mmol/L (ref 98–111)
Creatinine, Ser: 0.83 mg/dL (ref 0.44–1.00)
GFR, Estimated: >60 mL/min (ref >60)
Glucose, Bld: 121 mg/dL — ABNORMAL HIGH (ref 70–99)
Potassium: 4.3 mmol/L (ref 3.5–5.1)
Sodium: 140 mmol/L (ref 135–145)

## 2023-06-12 LAB — HEMOGLOBIN AND HEMATOCRIT, BLOOD
HCT: 22.6 % — ABNORMAL LOW (ref 36.0–46.0)
HCT: 20.3 % — ABNORMAL LOW (ref 36.0–46.0)
Hemoglobin: 7.5 g/dL — ABNORMAL LOW (ref 12.0–15.0)
Hemoglobin: 6.7 g/dL — ABNORMAL LOW (ref 12.0–15.0)

## 2023-06-12 LAB — CBC
HCT: 23.1 % — ABNORMAL LOW (ref 36.0–46.0)
Hemoglobin: 7.6 g/dL — ABNORMAL LOW (ref 12.0–15.0)
MCH: 28.9 pg (ref 26.0–34.0)
MCHC: 32.9 g/dL (ref 30.0–36.0)
MCV: 87.8 fL (ref 80.0–100.0)
Platelets: 270 10*3/uL (ref 150–400)
RBC: 2.63 MIL/uL — ABNORMAL LOW (ref 3.87–5.11)
RDW: 15.4 % (ref 11.5–15.5)
WBC: 7.6 10*3/uL (ref 4.0–10.5)
nRBC: 0 % (ref 0.0–0.2)

## 2023-06-12 MED ORDER — SODIUM CHLORIDE 0.9% IV SOLUTION: Freq: Once | INTRAVENOUS | Status: AC

## 2023-06-12 NOTE — Care Management Important Message (Signed)
 Important Message  Patient Details  Name: Catherine Burgess MRN: [id number] Date of Birth: [birth year]   Important Message Given:  Yes - Medicare IM     Anise Kerns 06/12/2023, 12:54 PM

## 2023-06-12 NOTE — Plan of Care (Signed)
  Problem: Activity: Goal: Risk for activity intolerance will decrease Outcome: Progressing   Problem: Nutrition: Goal: Adequate nutrition will be maintained Outcome: Progressing   Problem: Coping: Goal: Level of anxiety will decrease Outcome: Progressing   Problem: Elimination: Goal: Will not experience complications related to bowel motility Outcome: Progressing   Problem: Elimination: Goal: Will not experience complications related to urinary retention Outcome: Progressing   Problem: Safety: Goal: Ability to remain free from injury will improve Outcome: Progressing

## 2023-06-12 NOTE — Progress Notes (Signed)
 PROGRESS NOTE    Catherine Burgess   UEA:[id number] DOB: October 21, 1935  DOA: 06/09/2023 Date of Service: 06/12/23 which is hospital day 3  PCP: Sowles, Krichna, MD    Hospital course / significant events:   HPI: [name] is an 88 year old female with history of hypertension, hyperlipidemia, neuropathy, GERD, who presents emergency department 04/30 for chief concerns of bright red blood per rectum starting 04/28. She reports this is never happened before. She reports her last colonoscopy was in 2022 and it was normal. She was seen in the emergency room 04/29, dc home w/ follow-up as an outpatient. Dr. Baldomero [facility] was reviewing the patient's lab work 04/30 and suggested the patient come back to the hospital - concern for increased BUN possible upper GI bleed. Also 04/05/23 Hgb 14, and 06/09/23 Hgb 8.9  04/30: admitted to hospitalist service w/ GI consult. Plan EGD/colonoscopy tomorrow.  05/01: EGD small hiatal hernia, non-bleeding duodenal diverticulum, cpli placed on mucosal tear from scope trauma. Colonoscopy diverticulosis, non-bleeding internal hemorrhoids. OK to resume diet. Hgb still dropping some, will recheck in AM, monitor for bleeding  05/02: Hgb still lower, pt this AM reporting L arm pain and headache --> EKG ok, troponin 10, CBC 7.3 down to 6.4 in <4 hours. RN reports bloody stool, frank blood. Transfused PRBC, CTA GI bleed was negative for source, getting nuclear tagged RBC scan as well, nothing there either. Monitor. Hgb following transfusion 8.4 05/03: Hgb 7.6 this morning. Passng dark stool / clots in stool but no overt bleeding, Hgb stable 7.5, conitnue monitoring through tonight      Consultants:  Gastroenterology   Procedures/Surgeries: 06/10/23 EGD 06/10/23 Colonoscopy       ASSESSMENT & PLAN:   Acute blood loss anemia Secondary to GI bleed S/p EGD and colonoscopy --> see above This morning 05/02: CBC 7.3 early AM down to 6.4 in <4 hours.  RN reports  bloody stool, frank blood. Suspect new diverticular bleed - CTA GI bleed and nuclear tagged RBC scan were negative for source IV PPI Monitor CBC and bleeding  Transfused PRBC 05/02, monitor HH blood bank to hold 2 units ahead  vascular aware as FYI - if bleeding recurs / substantial drop Hgb would repeat CTA   Small hiatal hernia Non-bleeding duodenal diverticulum Colonic diverticulosis Non-bleeding internal hemorrhoids.  No source bleeding found on EGD/colonoscopy If further bleeding plan CTA/embolization - see above  Dyslipidemia associated with type 2 diabetes mellitus  atorvastatin  40 mg nightly    PAD (peripheral artery disease)  Aspirin  not resumed on admission Consider resume ASA if/when benefits outweigh the risk but certainly holding for now    Essential hypertension Home amlodipine  2.5 mg daily, losartan  50 mg daily resumed on admission       N/a based on BMI: There is no height or weight on file to calculate BMI.. Significantly low or high BMI is associated with higher medical risk.  Underweight - under 18  overweight - 25 to 29 obese - 30 or more Class 1 obesity: BMI of 30.0 to 34 Class 2 obesity: BMI of 35.0 to 39 Class 3 obesity: BMI of 40.0 to 49 Super Morbid Obesity: BMI 50-59 Super-super Morbid Obesity: BMI 60+ Healthy nutrition and physical activity advised as adjunct to other disease management and risk reduction treatments    DVT prophylaxis: no Rx d/t anemia/bleed risk IV fluids: no continuous IV fluids  Nutrition: advance to regular diet  Central lines / other devices: none  Code Status: FULL CODE  ACP documentation reviewed:  none on file in [facility]  TOC needs: TBD Medical barriers to dispo: anemia. Expected medical readiness for discharge pending Hgb / bleeding             Subjective / Brief ROS:  Patient reports feeling better this morning, tired but wants to get up and walk around  Denies CP/SOB.  Pain controlled.  Denies new  weakness.  No diet yet  Reports no concerns w/ urination/defecation.   Family Communication: none at bedside     Objective Findings:  Vitals:   06/11/23 2355 06/12/23 0420 06/12/23 0855 06/12/23 1228  BP: (!) 130/56 (!) 109/58 129/62 (!) 121/59  Pulse: 79 81 81 78  Resp: 16 18    Temp: 97.6 F (36.4 C) 98.6 F (37 C) 99.1 F (37.3 C) 98.6 F (37 C)  TempSrc:      SpO2: 100% 100% 100% 100%    Intake/Output Summary (Last 24 hours) at 06/12/2023 1631 Last data filed at 06/12/2023 1408 Gross per 24 hour  Intake 480 ml  Output --  Net 480 ml   There were no vitals filed for this visit.  Examination:   Physical Exam Constitutional:      General: She is not in acute distress.    Appearance: She is not ill-appearing.     Comments: This morning looked pale, improved this afternoon  Cardiovascular:     Rate and Rhythm: Normal rate and regular rhythm.  Pulmonary:     Effort: Pulmonary effort is normal.     Breath sounds: Normal breath sounds.  Abdominal:     General: Abdomen is flat.     Palpations: Abdomen is soft.  Neurological:     Mental Status: She is alert.  Psychiatric:        Mood and Affect: Mood normal.        Behavior: Behavior normal.          Scheduled Medications:   sodium chloride    Intravenous Once   amLODipine   2.5 mg Oral Daily   atorvastatin   40 mg Oral QHS   calcium -vitamin D   1 tablet Oral Daily   gabapentin   300 mg Oral Daily   And   gabapentin   600 mg Oral QHS   losartan   50 mg Oral Daily   pantoprazole   40 mg Oral Daily    Continuous Infusions:    PRN Medications:  acetaminophen  **OR** acetaminophen , fluticasone , ondansetron  **OR** ondansetron  (ZOFRAN ) IV, senna-docusate  Antimicrobials from admission:  Anti-infectives (From admission, onward)    None           Data Reviewed:  I have personally reviewed the following...  CBC: Recent Labs  Lab 06/08/23 1426 06/08/23 1635 06/09/23 1106 06/10/23 0417  06/11/23 0437 06/11/23 0804 06/11/23 1759 06/12/23 0452 06/12/23 1256  WBC 8.0   < > 7.2 5.5 6.2 9.3  --  7.6  --   NEUTROABS 4.8  --   --   --   --   --   --   --   --   HGB 10.6*   < > 8.9* 7.7* 7.3* 6.4* 8.4* 7.6* 7.5*  HCT 32.9*   < > 27.9* 23.9* 23.2* 20.2* 25.2* 23.1* 22.6*  MCV 88.2   < > 88.9 87.2 87.5 88.6  --  87.8  --   PLT 289   < > 297 225 239 260  --  270  --    < > = values in this interval not  displayed.   Basic Metabolic Panel: Recent Labs  Lab 06/08/23 1635 06/09/23 1106 06/10/23 0417 06/11/23 0804 06/12/23 0452  NA 140 141 142 142 140  K 3.5 3.4* 3.5 3.3* 4.3  CL 103 108 112* 104 105  CO2 24 24 25 27 28   GLUCOSE 151* 125* 100* 113* 121*  BUN 24* 25* 15 37* 19  CREATININE 0.80 0.86 0.60 0.75 0.83  CALCIUM  8.7* 7.8* 7.9* 9.6 8.5*  MG  --  2.0  --   --   --    GFR: Estimated Creatinine Clearance: 41 mL/min (by C-G formula based on SCr of 0.83 mg/dL). Liver Function Tests: Recent Labs  Lab 06/08/23 1635 06/09/23 1106  AST 20 19  ALT 16 16  ALKPHOS 96 83  BILITOT 0.9 0.5  PROT 6.0* 5.5*  ALBUMIN 3.3* 2.9*   No results for input(s): "LIPASE", "AMYLASE" in the last 168 hours. No results for input(s): "AMMONIA" in the last 168 hours. Coagulation Profile: No results for input(s): "INR", "PROTIME" in the last 168 hours. Cardiac Enzymes: No results for input(s): "CKTOTAL", "CKMB", "CKMBINDEX", "TROPONINI" in the last 168 hours. BNP (last 3 results) No results for input(s): "PROBNP" in the last 8760 hours. HbA1C: No results for input(s): "HGBA1C" in the last 72 hours. CBG: No results for input(s): "GLUCAP" in the last 168 hours. Lipid Profile: No results for input(s): "CHOL", "HDL", "LDLCALC", "TRIG", "CHOLHDL", "LDLDIRECT" in the last 72 hours. Thyroid  Function Tests: No results for input(s): "TSH", "T4TOTAL", "FREET4", "T3FREE", "THYROIDAB" in the last 72 hours. Anemia Panel: No results for input(s): "VITAMINB12", "FOLATE", "FERRITIN", "TIBC",  "IRON", "RETICCTPCT" in the last 72 hours. Most Recent Urinalysis On File:     Component Value Date/Time   COLORURINE YELLOW (A) 06/13/2017 2253   APPEARANCEUR HAZY (A) 06/13/2017 2253   APPEARANCEUR Clear 01/22/2014 1210   LABSPEC 1.016 06/13/2017 2253   LABSPEC 1.012 01/22/2014 1210   PHURINE 5.0 06/13/2017 2253   GLUCOSEU NEGATIVE 06/13/2017 2253   GLUCOSEU Negative 01/22/2014 1210   HGBUR NEGATIVE 06/13/2017 2253   BILIRUBINUR NEGATIVE 06/13/2017 2253   BILIRUBINUR negative 12/17/2015 1558   BILIRUBINUR Negative 01/22/2014 1210   KETONESUR NEGATIVE 06/13/2017 2253   PROTEINUR NEGATIVE 06/13/2017 2253   UROBILINOGEN negative 12/17/2015 1558   NITRITE NEGATIVE 06/13/2017 2253   LEUKOCYTESUR TRACE (A) 06/13/2017 2253   LEUKOCYTESUR Trace 01/22/2014 1210   Sepsis Labs: @LABRCNTIP (procalcitonin:4,lacticidven:4) Microbiology: No results found for this or any previous visit (from the past 240 hours).    Radiology Studies last 3 days: NM GI Blood Loss Result Date: 06/11/2023 CLINICAL DATA:  Gastrointestinal bleeding. Bowel movements with blood since Tuesday. Decreased hemoglobin 6.4. EXAM: NUCLEAR MEDICINE GASTROINTESTINAL BLEEDING SCAN TECHNIQUE: Sequential abdominal images were obtained following intravenous administration of Tc-66m labeled red blood cells. Imaging was obtained at 1 hour and 2 hours. RADIOPHARMACEUTICALS:  22.83 mCi Tc-61m pertechnetate in-vitro labeled red cells. COMPARISON:  CT abdomen and pelvis 06/11/2023 FINDINGS: Normal flow, blood pool, and urinary tract activity demonstrated in the abdomen and pelvis. No bowel activity is demonstrated. IMPRESSION: Normal examination. No scintigraphic evidence of active gastrointestinal bleeding. Electronically Signed   By: Boyce Byes M.D.   On: 06/11/2023 18:01   CT ANGIO GI BLEED Result Date: 06/11/2023 CLINICAL DATA:  GI bleed. Rectal bleeding and intermittent abdominal pain. Decreasing hemoglobin. EXAM: CTA ABDOMEN AND  PELVIS WITHOUT AND WITH CONTRAST TECHNIQUE: Multidetector CT imaging of the abdomen and pelvis was performed using the standard protocol during bolus administration of intravenous contrast. Multiplanar reconstructed  images and MIPs were obtained and reviewed to evaluate the vascular anatomy. RADIATION DOSE REDUCTION: This exam was performed according to the departmental dose-optimization program which includes automated exposure control, adjustment of the mA and/or kV according to patient size and/or use of iterative reconstruction technique. CONTRAST:  OMNIPAQUE  IOHEXOL  350 MG/ML SOLN COMPARISON:  CTA GI bleed from 06/08/2023. FINDINGS: VASCULAR Aorta: Normal caliber aorta without aneurysm, dissection, vasculitis or significant stenosis. Celiac: Patent without evidence of aneurysm, dissection, vasculitis or significant stenosis. SMA: Patent without evidence of aneurysm, dissection, vasculitis or significant stenosis. Renals: Both renal arteries are patent without evidence of aneurysm, dissection, vasculitis, fibromuscular dysplasia or significant stenosis. IMA: Patent without evidence of aneurysm, dissection, vasculitis or significant stenosis. Inflow: Patent without evidence of aneurysm, dissection, vasculitis or significant stenosis. Proximal Outflow: Bilateral common femoral and visualized portions of the superficial and profunda femoral arteries are patent without evidence of aneurysm, dissection, vasculitis or significant stenosis. Veins: No obvious venous abnormality within the limitations of this arterial phase study. Review of the MIP images confirms the above findings. NON-VASCULAR Lower chest: The lung bases are clear. No pleural effusion. The heart is normal in size. No pericardial effusion. Hepatobiliary: The liver is normal in size. Non-cirrhotic configuration. No suspicious mass. These is mild diffuse hepatic steatosis. There is mild central intrahepatic and extrahepatic bile duct dilation.  However, no choledocholithiasis or obstructing mass seen. No calcified gallstones. Normal gallbladder wall thickness. No pericholecystic inflammatory changes. Pancreas: Unremarkable. No pancreatic ductal dilatation or surrounding inflammatory changes. Spleen: Within normal limits. No focal lesion. Adrenals/Urinary Tract: Adrenal glands are unremarkable. No suspicious renal mass. There is a partially exophytic subcentimeter sized simple cyst arising from the right kidney lower pole, anteriorly. No hydronephrosis. No renal or ureteric calculi. Urinary bladder is under distended, precluding optimal assessment. However, no large mass or stones identified. No perivesical fat stranding. Stomach/Bowel: There is moderate to marked thickening of the fundus, cardia and body of stomach, similar to the prior study. This is incompletely evaluated on the current exam. Findings may be due to underdistention however, underlying gastritis can also have similar appearance. Correlate clinically to determine the need for endoscopy if not recently performed. There are at least 2 adjacent diverticula arising from the 2/3 part of duodenum. There are multiple additional diverticula in the proximal small bowel loops. No disproportionate dilation of the small or large bowel loops. No evidence of abnormal bowel wall thickening or inflammatory changes. The appendix is unremarkable. There are multiple diverticula throughout the colon, without imaging signs of diverticulitis. No active extravasation of intravenous contrast noted within the bowel loops. Vascular/Lymphatic: No ascites or pneumoperitoneum. No abdominal or pelvic lymphadenopathy, by size criteria. No aneurysmal dilation of the major abdominal arteries. There are mild peripheral atherosclerotic vascular calcifications of the aorta and its major branches. Reproductive: Redemonstration of enlarged/bulky lobulated uterus containing multiple calcified and noncalcified leiomyomas. No  large adnexal mass seen. Other: There is a tiny fat containing umbilical hernia. The soft tissues and abdominal wall are otherwise unremarkable. Musculoskeletal: No suspicious osseous lesions. There are mild multilevel degenerative changes in the visualized spine. IMPRESSION: 1. No active extravasation of intravenous contrast to suggest active GI bleeding. 2. Moderate-to-marked gastric thickening, as described above. 3. Multiple other nonacute observations, as described above. Aortic Atherosclerosis (ICD10-I70.0). Electronically Signed   By: Beula Brunswick M.D.   On: 06/11/2023 10:30   CT ANGIO GI BLEED Result Date: 06/08/2023 CLINICAL DATA:  Abdominal pain. Gastrointestinal bleeding. Rectal bleeding. Rectal bleeding starting last night.  Bright red blood in stool. Mid abdominal pain. EXAM: CTA ABDOMEN AND PELVIS WITHOUT AND WITH CONTRAST TECHNIQUE: Multidetector CT imaging of the abdomen and pelvis was performed using the standard protocol during bolus administration of intravenous contrast. Multiplanar reconstructed images and MIPs were obtained and reviewed to evaluate the vascular anatomy. RADIATION DOSE REDUCTION: This exam was performed according to the departmental dose-optimization program which includes automated exposure control, adjustment of the mA and/or kV according to patient size and/or use of iterative reconstruction technique. CONTRAST:  OMNIPAQUE  IOHEXOL  350 MG/ML SOLN COMPARISON:  01/24/2017 FINDINGS: VASCULAR Aorta: Unenhanced images demonstrate no significant aortic calcification. Arterial phase contrast-enhanced images demonstrate normal caliber abdominal aorta. No dissection. No significant stenosis. Celiac: Patent without evidence of aneurysm, dissection, vasculitis or significant stenosis. SMA: Patent without evidence of aneurysm, dissection, vasculitis or significant stenosis. Renals: Both renal arteries are patent without evidence of aneurysm, dissection, vasculitis, fibromuscular  dysplasia or significant stenosis. IMA: Patent without evidence of aneurysm, dissection, vasculitis or significant stenosis. Inflow: Patent without evidence of aneurysm, dissection, vasculitis or significant stenosis. Calcification of the common iliac arteries. Proximal Outflow: Bilateral common femoral and visualized portions of the superficial and profunda femoral arteries are patent without evidence of aneurysm, dissection, vasculitis or significant stenosis. Veins: No obvious venous abnormality within the limitations of this arterial phase study. Review of the MIP images confirms the above findings. NON-VASCULAR Lower chest: Slight fibrosis in the lung bases. Small esophageal hiatal hernia. Hepatobiliary: No focal liver abnormality is seen. No gallstones, gallbladder wall thickening, or biliary dilatation. Pancreas: Unremarkable. No pancreatic ductal dilatation or surrounding inflammatory changes. Spleen: Normal in size without focal abnormality. Adrenals/Urinary Tract: Adrenal glands are unremarkable. Kidneys are normal, without renal calculi, focal lesion, or hydronephrosis. Bladder is unremarkable. Stomach/Bowel: Stomach, small bowel, and colon are not abnormally distended. There is evidence of gastric fold thickening with hyperemia which may indicate gastritis. Focal nonspecific luminal narrowing and wall thickening at the splenic flexure. This could be physiologic contraction but may indicate an underlying neoplasm. Consider colonoscopic evaluation if clinically indicated. Colonic diverticulosis without evidence of acute diverticulitis. Appendix is normal. No evidence of arterial or delayed phase intraluminal contrast extravasation. No radiographic findings to indicate any areas of active gastrointestinal hemorrhage. Lymphatic: No significant lymphadenopathy. Reproductive: Large calcified masses in the uterus consistent with uterine fibroids. No abnormal adnexal masses. Other: No free air or free fluid in  the abdomen. Abdominal wall musculature appears intact. Musculoskeletal: Degenerative changes in the spine. Postoperative changes in the left hip. No acute bony abnormalities. IMPRESSION: 1. Mild iliac atherosclerosis. 2. No large vessel occlusion, aneurysm, or dissection. 3. No radiographic evidence of any focal site of active gastrointestinal bleeding. 4. Gastric fold thickening and hyperemia may indicate gastritis. 5. Focal area of luminal narrowing and wall thickening of the splenic flexure of the colon could be physiologic but may indicate underlying neoplasm. Consider endoscopic evaluation if clinically indicated. 6. Colonic diverticulosis without evidence of acute diverticulitis. 7. Large uterine fibroids. 8. Small esophageal hiatal hernia. Electronically Signed   By: Boyce Byes M.D.   On: 06/08/2023 21:30         Jahsiah Carpenter, DO [facility] 06/12/2023, 4:31 PM    Dictation software may have been used to generate the above note. Typos may occur and escape review in typed/dictated notes. Please contact Dr Authur Leghorn directly for clarity if needed.  Staff may message me via secure chat in Epic  but this may not receive an immediate response,  please page me  for urgent matters!  If 7PM-7AM, please contact night coverage [email or web address]

## 2023-06-12 NOTE — Progress Notes (Signed)
 Upon report, pt requested to use bathroom; Pt was to weak, diaphoretic and had bloody stool with clots; pt was feeling better after with vitals stable; NP notified and checked on pt at bedside; H&H scheduled, will continue to monitor.

## 2023-06-13 DIAGNOSIS — D62 Acute posthemorrhagic anemia: Secondary | ICD-10-CM | POA: Diagnosis not present

## 2023-06-13 LAB — BPAM RBC
Blood Product Expiration Date: 202505302359
Blood Product Expiration Date: 202505302359
ISSUE DATE / TIME: 202505021149
ISSUE DATE / TIME: 202505032256
ISSUE DATE / TIME: 202505302359
Unit Type and Rh: 202505302359
Unit Type and Rh: 202505302359
Unit Type and Rh: 5100
Unit Type and Rh: 5100
Unit Type and Rh: 5100

## 2023-06-13 LAB — BASIC METABOLIC PANEL WITH GFR
Anion gap: 7 (ref 5–15)
BUN: 27 mg/dL — ABNORMAL HIGH (ref 8–23)
CO2: 27 mmol/L (ref 22–32)
Calcium: 8.1 mg/dL — ABNORMAL LOW (ref 8.9–10.3)
Chloride: 106 mmol/L (ref 98–111)
Creatinine, Ser: 0.61 mg/dL (ref 0.44–1.00)
GFR, Estimated: >60 mL/min (ref >60)
Glucose, Bld: 127 mg/dL — ABNORMAL HIGH (ref 70–99)
Potassium: 4.2 mmol/L (ref 3.5–5.1)
Sodium: 140 mmol/L (ref 135–145)

## 2023-06-13 LAB — TYPE AND SCREEN
ABO/RH(D): O POS
Antibody Screen: NEGATIVE
Unit division: 0
Unit division: 0
Unit division: 0

## 2023-06-13 LAB — CBC
HCT: 25.8 % — ABNORMAL LOW (ref 36.0–46.0)
Hemoglobin: 8.5 g/dL — ABNORMAL LOW (ref 12.0–15.0)
MCH: 27.3 pg (ref 26.0–34.0)
MCHC: 32.9 g/dL (ref 30.0–36.0)
MCV: 83 fL (ref 80.0–100.0)
Platelets: 220 10*3/uL (ref 150–400)
RBC: 3.11 MIL/uL — ABNORMAL LOW (ref 3.87–5.11)
RDW: 17 % — ABNORMAL HIGH (ref 11.5–15.5)
WBC: 10.5 10*3/uL (ref 4.0–10.5)
nRBC: 0.7 % — ABNORMAL HIGH (ref 0.0–0.2)

## 2023-06-13 LAB — HEMOGLOBIN AND HEMATOCRIT, BLOOD
HCT: 26 % — ABNORMAL LOW (ref 36.0–46.0)
HCT: 25.6 % — ABNORMAL LOW (ref 36.0–46.0)
Hemoglobin: 8.6 g/dL — ABNORMAL LOW (ref 12.0–15.0)
Hemoglobin: 8.4 g/dL — ABNORMAL LOW (ref 12.0–15.0)

## 2023-06-13 NOTE — Significant Event (Signed)
       CROSS COVER NOTE  NAME: Catherine Burgess MRN: [id number] DOB : [birth year] ATTENDING PHYSICIAN: Melodi Sprung, DO    Date of Service   06/13/2023   HPI/Events of Note   BBRBPR accompanied by diaphoresis when up to toilet at 1900 Hgb now 6.7 down from 7.5  Interventions   Assessment/Plan:    06/13/2023    4:14 AM 06/13/2023    2:22 AM 06/12/2023   11:23 PM  Vitals with BMI  Systolic 116 119 409  Diastolic 62 55 54  Pulse 84 82 88    Alert and oriented Skin warm and dry Discussed blood transfusion - risk and befits Transfuse additional unit PRBC        Kip Peon NP [facility] Cross Cover 7pm-7am - check amion for availability Pager [phone]

## 2023-06-13 NOTE — Plan of Care (Signed)

## 2023-06-13 NOTE — Progress Notes (Signed)
 PROGRESS NOTE    Catherine Burgess   ZOX:[id number] DOB: March 03, 1935  DOA: 06/09/2023 Date of Service: 06/13/23 which is hospital day 4  PCP: Sowles, Krichna, MD    Hospital course / significant events:   HPI: Catherine Burgess is an 88 year old female with history of hypertension, hyperlipidemia, neuropathy, GERD, who presents emergency department 04/30 for chief concerns of bright red blood per rectum starting 04/28. She reports this is never happened before. She reports her last colonoscopy was in 2022 and it was normal. She was seen in the emergency room 04/29, dc home w/ follow-up as an outpatient. Dr. Baldomero Bone GI was reviewing the patient's lab work 04/30 and suggested the patient come back to the hospital - concern for increased BUN possible upper GI bleed. Also 04/05/23 Hgb 14, and 06/09/23 Hgb 8.9  04/30: admitted to hospitalist service w/ GI consult. Plan EGD/colonoscopy tomorrow.  05/01: EGD small hiatal hernia, non-bleeding duodenal diverticulum, cpli placed on mucosal tear from scope trauma. Colonoscopy diverticulosis, non-bleeding internal hemorrhoids. OK to resume diet. Hgb still dropping some, will recheck in AM, monitor for bleeding  05/02: Hgb still lower, pt this AM reporting L arm pain and headache --> EKG ok, troponin 10, CBC 7.3 down to 6.4 in <4 hours. RN reports bloody stool, frank blood. Transfused PRBC, CTA GI bleed was negative for source, getting nuclear tagged RBC scan as well, nothing there either. Monitor. Hgb following transfusion 8.4 05/03: Hgb 7.6 this morning. Passng dark stool / clots in stool but no overt bleeding, Hgb stable 7.5, conitnue monitoring through tonight -->Hgb 6.7 05/04: overnight another episode BRBPR and transfused 1 unit PRBC, this morning Hgb 8.5 and holding at 8.6 this afternoon. Monitor and goal at least 24h stable HH and no bleeding      Consultants:  Gastroenterology   Procedures/Surgeries: 06/10/23 EGD 06/10/23 Colonoscopy        ASSESSMENT & PLAN:   Acute blood loss anemia Secondary to GI bleed S/p EGD and colonoscopy --> see above This morning 05/02: CBC 7.3 early AM down to 6.4 in <4 hours.  RN reports bloody stool, frank blood. Suspect new diverticular bleed - CTA GI bleed and nuclear tagged RBC scan were negative for source IV PPI Monitor CBC and bleeding - this morning Hgb 8.5 and holding at 8.6 this afternoon. Monitor and goal at least 24h stable HH and no bleeding  Transfused 1 unit PRBC 05/02, 1 unit PRBC overnight 05/03-05/04, monitor HH blood bank to hold 2 units ahead  vascular aware as FYI - if bleeding recurs / substantial drop Hgb would repeat CTA   Small hiatal hernia Non-bleeding duodenal diverticulum Colonic diverticulosis Non-bleeding internal hemorrhoids.  No source bleeding found on EGD/colonoscopy If further bleeding plan CTA/embolization - see above  Dyslipidemia associated with type 2 diabetes mellitus  atorvastatin  40 mg nightly    PAD (peripheral artery disease)  Aspirin  not resumed on admission Consider resume ASA if/when benefits outweigh the risk but certainly holding for now    Essential hypertension Home amlodipine  2.5 mg daily, losartan  50 mg daily resumed on admission       N/a based on BMI: There is no height or weight on file to calculate BMI.. Significantly low or high BMI is associated with higher medical risk.  Underweight - under 18  overweight - 25 to 29 obese - 30 or more Class 1 obesity: BMI of 30.0 to 34 Class 2 obesity: BMI of 35.0 to 39 Class 3 obesity: BMI of 40.0  to 49 Super Morbid Obesity: BMI 50-59 Super-super Morbid Obesity: BMI 60+ Healthy nutrition and physical activity advised as adjunct to other disease management and risk reduction treatments    DVT prophylaxis: no Rx d/t anemia/bleed risk IV fluids: no continuous IV fluids  Nutrition: advance to regular diet  Central lines / other devices: none  Code Status: FULL CODE   ACP documentation reviewed:  none on file in [facility]  TOC needs: TBD Medical barriers to dispo: anemia. Expected medical readiness for discharge pending Hgb / bleeding - goal at least 24h stable HH and no bleeding              Subjective / Brief ROS:  Patient reports feeling better this morning no further bleeding  Denies CP/SOB.  Pain controlled.  Denies new weakness.  No diet yet  Reports no concerns w/ urination/defecation.   Family Communication: none at this time     Objective Findings:  Vitals:   06/13/23 0222 06/13/23 0414 06/13/23 0731 06/13/23 1131  BP: (!) 119/55 116/62 (!) 120/58 137/63  Pulse: 82 84 84 (!) 102  Resp:   16 20  Temp: 98.4 F (36.9 C) 97.9 F (36.6 C) 98.4 F (36.9 C) 98.6 F (37 C)  TempSrc: Oral Oral    SpO2: 100% 100% 98% 100%    Intake/Output Summary (Last 24 hours) at 06/13/2023 1317 Last data filed at 06/13/2023 0830 Gross per 24 hour  Intake 1069 ml  Output --  Net 1069 ml   There were no vitals filed for this visit.  Examination:   Physical Exam Constitutional:      General: She is not in acute distress.    Appearance: She is not ill-appearing.     Comments: This morning looked pale, improved this afternoon  Cardiovascular:     Rate and Rhythm: Normal rate and regular rhythm.  Pulmonary:     Effort: Pulmonary effort is normal.     Breath sounds: Normal breath sounds.  Abdominal:     General: Abdomen is flat.     Palpations: Abdomen is soft.  Neurological:     Mental Status: She is alert.  Psychiatric:        Mood and Affect: Mood normal.        Behavior: Behavior normal.          Scheduled Medications:   sodium chloride    Intravenous Once   amLODipine   2.5 mg Oral Daily   atorvastatin   40 mg Oral QHS   calcium -vitamin D   1 tablet Oral Daily   gabapentin   300 mg Oral Daily   And   gabapentin   600 mg Oral QHS   losartan   50 mg Oral Daily   pantoprazole   40 mg Oral Daily    Continuous  Infusions:    PRN Medications:  acetaminophen  **OR** acetaminophen , fluticasone , ondansetron  **OR** ondansetron  (ZOFRAN ) IV, senna-docusate  Antimicrobials from admission:  Anti-infectives (From admission, onward)    None           Data Reviewed:  I have personally reviewed the following...  CBC: Recent Labs  Lab 06/08/23 1426 06/08/23 1635 06/10/23 0417 06/11/23 0437 06/11/23 0804 06/11/23 1759 06/12/23 0452 06/12/23 1256 06/12/23 2128 06/13/23 0440 06/13/23 1246  WBC 8.0   < > 5.5 6.2 9.3  --  7.6  --   --  10.5  --   NEUTROABS 4.8  --   --   --   --   --   --   --   --   --   --  HGB 10.6*   < > 7.7* 7.3* 6.4*   < > 7.6* 7.5* 6.7* 8.5* 8.6*  HCT 32.9*   < > 23.9* 23.2* 20.2*   < > 23.1* 22.6* 20.3* 25.8* 26.0*  MCV 88.2   < > 87.2 87.5 88.6  --  87.8  --   --  83.0  --   PLT 289   < > 225 239 260  --  270  --   --  220  --    < > = values in this interval not displayed.   Basic Metabolic Panel: Recent Labs  Lab 06/09/23 1106 06/10/23 0417 06/11/23 0804 06/12/23 0452 06/13/23 0440  NA 141 142 142 140 140  K 3.4* 3.5 3.3* 4.3 4.2  CL 108 112* 104 105 106  CO2 24 25 27 28 27   GLUCOSE 125* 100* 113* 121* 127*  BUN 25* 15 37* 19 27*  CREATININE 0.86 0.60 0.75 0.83 0.61  CALCIUM  7.8* 7.9* 9.6 8.5* 8.1*  MG 2.0  --   --   --   --    GFR: Estimated Creatinine Clearance: 42.5 mL/min (by C-G formula based on SCr of 0.61 mg/dL). Liver Function Tests: Recent Labs  Lab 06/08/23 1635 06/09/23 1106  AST 20 19  ALT 16 16  ALKPHOS 96 83  BILITOT 0.9 0.5  PROT 6.0* 5.5*  ALBUMIN 3.3* 2.9*   No results for input(s): "LIPASE", "AMYLASE" in the last 168 hours. No results for input(s): "AMMONIA" in the last 168 hours. Coagulation Profile: No results for input(s): "INR", "PROTIME" in the last 168 hours. Cardiac Enzymes: No results for input(s): "CKTOTAL", "CKMB", "CKMBINDEX", "TROPONINI" in the last 168 hours. BNP (last 3 results) No results for  input(s): "PROBNP" in the last 8760 hours. HbA1C: No results for input(s): "HGBA1C" in the last 72 hours. CBG: No results for input(s): "GLUCAP" in the last 168 hours. Lipid Profile: No results for input(s): "CHOL", "HDL", "LDLCALC", "TRIG", "CHOLHDL", "LDLDIRECT" in the last 72 hours. Thyroid  Function Tests: No results for input(s): "TSH", "T4TOTAL", "FREET4", "T3FREE", "THYROIDAB" in the last 72 hours. Anemia Panel: No results for input(s): "VITAMINB12", "FOLATE", "FERRITIN", "TIBC", "IRON", "RETICCTPCT" in the last 72 hours. Most Recent Urinalysis On File:     Component Value Date/Time   COLORURINE YELLOW (A) 06/13/2017 2253   APPEARANCEUR HAZY (A) 06/13/2017 2253   APPEARANCEUR Clear 01/22/2014 1210   LABSPEC 1.016 06/13/2017 2253   LABSPEC 1.012 01/22/2014 1210   PHURINE 5.0 06/13/2017 2253   GLUCOSEU NEGATIVE 06/13/2017 2253   GLUCOSEU Negative 01/22/2014 1210   HGBUR NEGATIVE 06/13/2017 2253   BILIRUBINUR NEGATIVE 06/13/2017 2253   BILIRUBINUR negative 12/17/2015 1558   BILIRUBINUR Negative 01/22/2014 1210   KETONESUR NEGATIVE 06/13/2017 2253   PROTEINUR NEGATIVE 06/13/2017 2253   UROBILINOGEN negative 12/17/2015 1558   NITRITE NEGATIVE 06/13/2017 2253   LEUKOCYTESUR TRACE (A) 06/13/2017 2253   LEUKOCYTESUR Trace 01/22/2014 1210   Sepsis Labs: @LABRCNTIP (procalcitonin:4,lacticidven:4) Microbiology: No results found for this or any previous visit (from the past 240 hours).    Radiology Studies last 3 days: NM GI Blood Loss Result Date: 06/11/2023 CLINICAL DATA:  Gastrointestinal bleeding. Bowel movements with blood since Tuesday. Decreased hemoglobin 6.4. EXAM: NUCLEAR MEDICINE GASTROINTESTINAL BLEEDING SCAN TECHNIQUE: Sequential abdominal images were obtained following intravenous administration of Tc-12m labeled red blood cells. Imaging was obtained at 1 hour and 2 hours. RADIOPHARMACEUTICALS:  22.83 mCi Tc-50m pertechnetate in-vitro labeled red cells. COMPARISON:  CT  abdomen and pelvis 06/11/2023 FINDINGS: Normal flow,  blood pool, and urinary tract activity demonstrated in the abdomen and pelvis. No bowel activity is demonstrated. IMPRESSION: Normal examination. No scintigraphic evidence of active gastrointestinal bleeding. Electronically Signed   By: Boyce Byes M.D.   On: 06/11/2023 18:01   CT ANGIO GI BLEED Result Date: 06/11/2023 CLINICAL DATA:  GI bleed. Rectal bleeding and intermittent abdominal pain. Decreasing hemoglobin. EXAM: CTA ABDOMEN AND PELVIS WITHOUT AND WITH CONTRAST TECHNIQUE: Multidetector CT imaging of the abdomen and pelvis was performed using the standard protocol during bolus administration of intravenous contrast. Multiplanar reconstructed images and MIPs were obtained and reviewed to evaluate the vascular anatomy. RADIATION DOSE REDUCTION: This exam was performed according to the departmental dose-optimization program which includes automated exposure control, adjustment of the mA and/or kV according to patient size and/or use of iterative reconstruction technique. CONTRAST:  OMNIPAQUE  IOHEXOL  350 MG/ML SOLN COMPARISON:  CTA GI bleed from 06/08/2023. FINDINGS: VASCULAR Aorta: Normal caliber aorta without aneurysm, dissection, vasculitis or significant stenosis. Celiac: Patent without evidence of aneurysm, dissection, vasculitis or significant stenosis. SMA: Patent without evidence of aneurysm, dissection, vasculitis or significant stenosis. Renals: Both renal arteries are patent without evidence of aneurysm, dissection, vasculitis, fibromuscular dysplasia or significant stenosis. IMA: Patent without evidence of aneurysm, dissection, vasculitis or significant stenosis. Inflow: Patent without evidence of aneurysm, dissection, vasculitis or significant stenosis. Proximal Outflow: Bilateral common femoral and visualized portions of the superficial and profunda femoral arteries are patent without evidence of aneurysm, dissection, vasculitis or  significant stenosis. Veins: No obvious venous abnormality within the limitations of this arterial phase study. Review of the MIP images confirms the above findings. NON-VASCULAR Lower chest: The lung bases are clear. No pleural effusion. The heart is normal in size. No pericardial effusion. Hepatobiliary: The liver is normal in size. Non-cirrhotic configuration. No suspicious mass. These is mild diffuse hepatic steatosis. There is mild central intrahepatic and extrahepatic bile duct dilation. However, no choledocholithiasis or obstructing mass seen. No calcified gallstones. Normal gallbladder wall thickness. No pericholecystic inflammatory changes. Pancreas: Unremarkable. No pancreatic ductal dilatation or surrounding inflammatory changes. Spleen: Within normal limits. No focal lesion. Adrenals/Urinary Tract: Adrenal glands are unremarkable. No suspicious renal mass. There is a partially exophytic subcentimeter sized simple cyst arising from the right kidney lower pole, anteriorly. No hydronephrosis. No renal or ureteric calculi. Urinary bladder is under distended, precluding optimal assessment. However, no large mass or stones identified. No perivesical fat stranding. Stomach/Bowel: There is moderate to marked thickening of the fundus, cardia and body of stomach, similar to the prior study. This is incompletely evaluated on the current exam. Findings may be due to underdistention however, underlying gastritis can also have similar appearance. Correlate clinically to determine the need for endoscopy if not recently performed. There are at least 2 adjacent diverticula arising from the 2/3 part of duodenum. There are multiple additional diverticula in the proximal small bowel loops. No disproportionate dilation of the small or large bowel loops. No evidence of abnormal bowel wall thickening or inflammatory changes. The appendix is unremarkable. There are multiple diverticula throughout the colon, without imaging  signs of diverticulitis. No active extravasation of intravenous contrast noted within the bowel loops. Vascular/Lymphatic: No ascites or pneumoperitoneum. No abdominal or pelvic lymphadenopathy, by size criteria. No aneurysmal dilation of the major abdominal arteries. There are mild peripheral atherosclerotic vascular calcifications of the aorta and its major branches. Reproductive: Redemonstration of enlarged/bulky lobulated uterus containing multiple calcified and noncalcified leiomyomas. No large adnexal mass seen. Other: There is a tiny fat  containing umbilical hernia. The soft tissues and abdominal wall are otherwise unremarkable. Musculoskeletal: No suspicious osseous lesions. There are mild multilevel degenerative changes in the visualized spine. IMPRESSION: 1. No active extravasation of intravenous contrast to suggest active GI bleeding. 2. Moderate-to-marked gastric thickening, as described above. 3. Multiple other nonacute observations, as described above. Aortic Atherosclerosis (ICD10-I70.0). Electronically Signed   By: Beula Brunswick M.D.   On: 06/11/2023 10:30         Emylee Decelle, DO [facility] 06/13/2023, 1:17 PM    Dictation software may have been used to generate the above note. Typos may occur and escape review in typed/dictated notes. Please contact Dr Authur Leghorn directly for clarity if needed.  Staff may message me via secure chat in Epic  but this may not receive an immediate response,  please page me for urgent matters!  If 7PM-7AM, please contact night coverage [email or web address]

## 2023-06-14 DIAGNOSIS — D62 Acute posthemorrhagic anemia: Secondary | ICD-10-CM | POA: Diagnosis not present

## 2023-06-14 LAB — CBC
HCT: 25.8 % — ABNORMAL LOW (ref 36.0–46.0)
Hemoglobin: 8.4 g/dL — ABNORMAL LOW (ref 12.0–15.0)
MCH: 27.8 pg (ref 26.0–34.0)
MCHC: 32.6 g/dL (ref 30.0–36.0)
MCV: 85.4 fL (ref 80.0–100.0)
Platelets: 243 10*3/uL (ref 150–400)
RBC: 3.02 MIL/uL — ABNORMAL LOW (ref 3.87–5.11)
RDW: 18.1 % — ABNORMAL HIGH (ref 11.5–15.5)
WBC: 12.7 10*3/uL — ABNORMAL HIGH (ref 4.0–10.5)
nRBC: 0.6 % — ABNORMAL HIGH (ref 0.0–0.2)

## 2023-06-14 LAB — BASIC METABOLIC PANEL WITH GFR
Anion gap: 8 (ref 5–15)
BUN: 24 mg/dL — ABNORMAL HIGH (ref 8–23)
CO2: 26 mmol/L (ref 22–32)
Calcium: 8.8 mg/dL — ABNORMAL LOW (ref 8.9–10.3)
Chloride: 108 mmol/L (ref 98–111)
Creatinine, Ser: 0.63 mg/dL (ref 0.44–1.00)
GFR, Estimated: >60 mL/min (ref >60)
Glucose, Bld: 103 mg/dL — ABNORMAL HIGH (ref 70–99)
Potassium: 4.2 mmol/L (ref 3.5–5.1)
Sodium: 142 mmol/L (ref 135–145)

## 2023-06-14 LAB — HEMOGLOBIN AND HEMATOCRIT, BLOOD
HCT: 26.4 % — ABNORMAL LOW (ref 36.0–46.0)
Hemoglobin: 8.5 g/dL — ABNORMAL LOW (ref 12.0–15.0)

## 2023-06-14 MED ORDER — FERROUS SULFATE 325 (65 FE) MG PO TBEC: 325.0000 mg | DELAYED_RELEASE_TABLET | Freq: Every day | ORAL | 0 refills | Status: DC

## 2023-06-14 MED ORDER — PANTOPRAZOLE SODIUM 40 MG PO TBEC: 40.0000 mg | DELAYED_RELEASE_TABLET | Freq: Two times a day (BID) | ORAL | 0 refills | Status: DC

## 2023-06-14 NOTE — Evaluation (Signed)
 Occupational Therapy Evaluation Patient Details Name: Catherine Burgess MRN: [id number] DOB: January 23, 1936 Today's Date: 06/14/2023   History of Present Illness   88 year old female with history of hypertension, hyperlipidemia, neuropathy, GERD, who presents emergency department 04/30 for chief concerns of bright red blood per rectum starting 04/28. She reports this is never happened before. She reports her last colonoscopy was in 2022 and it was normal. She was seen in the emergency room 04/29, dc home w/ follow-up as an outpatient. Dr. Baldomero Bone GI was reviewing the patient's lab work 04/30 and suggested the patient come back to the hospital - concern for increased BUN possible upper GI bleed.     Clinical Impressions Patient presenting with decreased Ind in self care,balance, functional mobility/transfers, endurance, and safety awareness. Patient reports being Ind at baseline. She uses SPC for mobility but often does not use at work. She works 3x/wk at day program. Pt drives self and is very independent. Patient currently functioning at supervision overall without use of AD for mobility and toileting needs with LB clothing management. Pt's L knee does not flex but she has adapted mobility needs as this is not new for her. Patient will benefit from acute OT to increase overall independence in the areas of ADLs, functional mobility, and safety awareness in order to safely discharge.     If plan is discharge home, recommend the following:   Assistance with cooking/housework;Assist for transportation     Functional Status Assessment   Patient has had a recent decline in their functional status and demonstrates the ability to make significant improvements in function in a reasonable and predictable amount of time.     Equipment Recommendations   None recommended by OT      Precautions/Restrictions   Precautions Precautions: Fall     Mobility Bed Mobility Overal bed mobility: Needs  Assistance Bed Mobility: Supine to Sit     Supine to sit: Modified independent (Device/Increase time)     General bed mobility comments: increased time and effort    Transfers Overall transfer level: Needs assistance Equipment used: None Transfers: Sit to/from Stand Sit to Stand: Supervision                  Balance Overall balance assessment: Mild deficits observed, not formally tested                                         ADL either performed or assessed with clinical judgement   ADL Overall ADL's : Needs assistance/impaired                         Toilet Transfer: Supervision/safety   Toileting- Clothing Manipulation and Hygiene: Supervision/safety               Vision Patient Visual Report: No change from baseline              Pertinent Vitals/Pain Pain Assessment Pain Assessment: Faces Faces Pain Scale: No hurt     Extremity/Trunk Assessment Upper Extremity Assessment Upper Extremity Assessment: Overall WFL for tasks assessed   Lower Extremity Assessment Lower Extremity Assessment: LLE deficits/detail LLE Deficits / Details: L LE knee in extension and unable to flex but pt has adapted       Communication Communication Communication: No apparent difficulties   Cognition Arousal: Alert Behavior During Therapy: WFL for tasks assessed/performed Cognition: No  apparent impairments                               Following commands: Intact                  Home Living Family/patient expects to be discharged to:: Private residence Living Arrangements: Alone Available Help at Discharge: Family Type of Home: House Home Access: Level entry     Home Layout: One level     Bathroom Shower/Tub: Chief Strategy Officer: Standard     Home Equipment: Cane - single Librarian, academic (2 wheels);Shower seat;BSC/3in1   Additional Comments: son lives nearby. 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      Prior Functioning/Environment Prior Level of Function : Independent/Modified Independent;Driving;Working/employed               ADLs Comments: Mod I with use of SPC.    OT Problem List: Decreased activity tolerance;Decreased safety awareness;Impaired balance (sitting and/or standing);Decreased knowledge of use of DME or AE   OT Treatment/Interventions: Self-care/ADL training;Therapeutic exercise;Therapeutic activities;Energy conservation;DME and/or AE instruction;Patient/family education;Balance training      OT Goals(Current goals can be found in the care plan section)   Acute Rehab OT Goals Patient Stated Goal: to go home OT Goal Formulation: With patient Time For Goal Achievement: 06/28/23 Potential to Achieve Goals: Fair ADL Goals Pt Will Perform Grooming: with modified independence;standing Pt Will Perform Lower Body Dressing: with modified independence;sit to/from stand Pt Will Transfer to Toilet: with modified independence;ambulating Pt Will Perform Toileting - Clothing Manipulation and hygiene: with modified independence;sit to/from stand   OT Frequency:  Min 1X/week       AM-PAC OT "6 Clicks" Daily Activity     Outcome Measure Help from another person eating meals?: None Help from another person taking care of personal grooming?: None Help from another person toileting, which includes using toliet, bedpan, or urinal?: A Little Help from another person bathing (including washing, rinsing, drying)?: None Help from another person to put on and taking off regular upper body clothing?: None Help from another person to put on and taking off regular lower body clothing?: A Little 6 Click Score: 22   End of Session Nurse Communication: Mobility status  Activity Tolerance: Patient tolerated treatment well Patient left: in chair;with call bell/phone within reach  OT Visit Diagnosis: Unsteadiness on feet (R26.81);Muscle weakness (generalized)  (M62.81)                Time: 1013-1030 OT Time Calculation (min): 17 min Charges:  OT General Charges $OT Visit: 1 Visit OT Evaluation $OT Eval Low Complexity: 1 Low OT Treatments $Self Care/Home Management : 8-22 mins  George Kinder, MS, OTR/L , CBIS ascom [phone]  06/14/23, 12:59 PM

## 2023-06-14 NOTE — Discharge Summary (Signed)
 Physician Discharge Summary   Patient: Catherine Burgess MRN: [id number]  DOB: [birth year]   Admit:     Date of Admission: 06/09/2023 Admitted from: home   Discharge: Date of discharge: 06/14/23 Disposition: Home Condition at discharge: good  CODE STATUS: FULL CODE     Discharge Physician: Melodi Sprung, DO [facility]     PCP: Sowles, Krichna, MD  Recommendations for Outpatient Follow-up:  Follow up with PCP Arleen Lacer, MD in 1-2 weeks Please obtain labs/tests: CBC, BMP in 1-2 weeks PCP AND OTHER OUTPATIENT PROVIDERS: SEE BELOW FOR SPECIFIC DISCHARGE INSTRUCTIONS PRINTED FOR PATIENT IN ADDITION TO GENERIC AVS PATIENT INFO     Discharge Instructions     Diet general   Complete by: As directed    Discharge instructions   Complete by: As directed    Increase antacid to twice daily for the next month - Rx sent to pharmacy for extra medication Recommend iron supplement - Rx sent to pharmacy Recommend daily fiber supplementation and/or psyllium (metamucil or similar) - OTC   If any severe bleeding, lots of red blood, lots of black tarry stool, dizziness/weakness, please return to ER / call 911   Increase activity slowly   Complete by: As directed          Discharge Diagnoses: Principal Problem:   Acute blood loss anemia Active Problems:   GERD without esophagitis   Essential hypertension   H/O total knee replacement   Osteopenia of multiple sites   History of CVA (cerebrovascular accident) without residual deficits   PAD (peripheral artery disease) (HCC)   Peripheral polyneuropathy   Dyslipidemia associated with type 2 diabetes mellitus [facility])     Hospital course / significant events:   HPI: [name] is an 88 year old female with history of hypertension, hyperlipidemia, neuropathy, GERD, who presents emergency department 04/30 for chief concerns of bright red blood per rectum starting 04/28. She reports this is never  happened before. She reports her last colonoscopy was in 2022 and it was normal. She was seen in the emergency room 04/29, dc home w/ follow-up as an outpatient. Dr. Baldomero [facility] was reviewing the patient's lab work 04/30 and suggested the patient come back to the hospital - concern for increased BUN possible upper GI bleed. Also 04/05/23 Hgb 14, and 06/09/23 Hgb 8.9  04/30: admitted to hospitalist service w/ GI consult. Plan EGD/colonoscopy tomorrow.  05/01: EGD small hiatal hernia, non-bleeding duodenal diverticulum, cpli placed on mucosal tear from scope trauma. Colonoscopy diverticulosis, non-bleeding internal hemorrhoids. OK to resume diet. Hgb still dropping some, will recheck in AM, monitor for bleeding  05/02: Hgb still lower, pt this AM reporting L arm pain and headache --> EKG ok, troponin 10, CBC 7.3 down to 6.4 in <4 hours. RN reports bloody stool, frank blood. Transfused PRBC, CTA GI bleed was negative for source, getting nuclear tagged RBC scan as well, nothing there either. Monitor. Hgb following transfusion 8.4 05/03: Hgb 7.6 this morning. Passng dark stool / clots in stool but no overt bleeding, Hgb stable 7.5, conitnue monitoring through tonight -->Hgb 6.7 05/04: overnight another episode BRBPR and transfused 1 unit PRBC, this morning Hgb 8.5 and holding at 8.6 this afternoon. Monitor and goal at least 24h stable HH and no bleeding  05/05: HH stable and no bleeding, pt eager for dc home and this is felt safe at this time, precautions reviewed      Consultants:  Gastroenterology   Procedures/Surgeries: 06/10/23 EGD 06/10/23 Colonoscopy  ASSESSMENT & PLAN:   Acute blood loss anemia Secondary to GI bleed S/p EGD and colonoscopy --> see above This morning 05/02: CBC 7.3 early AM down to 6.4 in <4 hours.  RN reports bloody stool, frank blood. Suspect new diverticular bleed - CTA GI bleed and nuclear tagged RBC scan were negative for source Increase po PPI to bid for now   Transfused 1 unit PRBC 05/02, 1 unit PRBC overnight 05/03-05/04 Monitor CBC outpatient Recommend iron supplement and fiber / stool softener  Small hiatal hernia Non-bleeding duodenal diverticulum Colonic diverticulosis Non-bleeding internal hemorrhoids.  No source bleeding found on EGD/colonoscopy Follow w/ GI as needed outpatient   Dyslipidemia associated with type 2 diabetes mellitus  atorvastatin  40 mg    PAD (peripheral artery disease)  Aspirin  HOLDING for now Consider resume ASA if/when benefits outweigh the risk - per outpatient    Essential hypertension Home amlodipine  2.5 mg daily, losartan  50 mg daily resumed on admission       N/a based on BMI: There is no height or weight on file to calculate BMI.. Significantly low or high BMI is associated with higher medical risk.  Underweight - under 18  overweight - 25 to 29 obese - 30 or more Class 1 obesity: BMI of 30.0 to 34 Class 2 obesity: BMI of 35.0 to 39 Class 3 obesity: BMI of 40.0 to 49 Super Morbid Obesity: BMI 50-59 Super-super Morbid Obesity: BMI 60+ Healthy nutrition and physical activity advised as adjunct to other disease management and risk reduction treatments             Discharge Instructions  Allergies as of 06/14/2023       Reactions   Lovastatin Other (See Comments)   chest   Pravastatin    Other reaction(s): Muscle Pain chest   Rosuvastatin    Other reaction(s): Muscle Pain chest Other reaction(s): Muscle Pain chest   Statins    Other reaction(s): Muscle Pain chest        Medication List     STOP taking these medications    aspirin  EC 81 MG tablet       TAKE these medications    acetaminophen  500 MG tablet Commonly known as: TYLENOL  Take 1 tablet (500 mg total) by mouth 2 (two) times daily. What changed:  when to take this reasons to take this   amLODipine  2.5 MG tablet Commonly known as: NORVASC  Take 1 tablet (2.5 mg total) by mouth daily.    atorvastatin  40 MG tablet Commonly known as: LIPITOR Take 1 tablet (40 mg total) by mouth daily. TAKE 1 TABLET(40 MG) BY MOUTH DAILY   Calcium -Vitamin D  600-200 MG-UNIT tablet Take 1 tablet by mouth daily.   ferrous sulfate  325 (65 FE) MG EC tablet Take 1 tablet (325 mg total) by mouth daily with breakfast.   fluticasone  50 MCG/ACT nasal spray Commonly known as: FLONASE  Place into the nose.   gabapentin  300 MG capsule Commonly known as: Neurontin  Take 1 capsule (300 mg total) by mouth every morning AND 2 capsules (600 mg total) at bedtime.   losartan  50 MG tablet Commonly known as: COZAAR  Take 1 tablet (50 mg total) by mouth daily.   pantoprazole  40 MG tablet Commonly known as: PROTONIX  Take 1 tablet (40 mg total) by mouth 2 (two) times daily before a meal. What changed: when to take this   tiZANidine  2 MG tablet Commonly known as: ZANAFLEX  Take 1 tablet (2 mg total) by mouth at bedtime. PRN  Follow-up Information     Sowles, Krichna, MD. Schedule an appointment as soon as possible for a visit.   Specialty: Family Medicine Why: hosptial follow up in 1-2 weeks - GI bleed, recommend follow up labs for CBC Contact information: [address] [place] [address] [phone]                 Allergies  Allergen Reactions   Lovastatin Other (See Comments)    chest   Pravastatin     Other reaction(s): Muscle Pain chest   Rosuvastatin     Other reaction(s): Muscle Pain chest Other reaction(s): Muscle Pain chest   Statins     Other reaction(s): Muscle Pain chest     Subjective: pt reports feeiling good this morning, walking independently, no lightheadedness or weakness, no futher bleeding    Discharge Exam: BP (!) 126/56 (BP Location: Right Arm)   Pulse 92   Temp 98.2 F (36.8 C) (Oral)   Resp 18   SpO2 100%  General: Pt is alert, awake, not in acute distress Cardiovascular: RRR, S1/S2 +, no rubs, no  gallops Respiratory: CTA bilaterally, no wheezing, no rhonchi Abdominal: Soft, NT, ND, bowel sounds + Extremities: no edema, no cyanosis     The results of significant diagnostics from this hospitalization (including imaging, microbiology, ancillary and laboratory) are listed below for reference.     Microbiology: No results found for this or any previous visit (from the past 240 hours).   Labs: BNP (last 3 results) No results for input(s): "BNP" in the last 8760 hours. Basic Metabolic Panel: Recent Labs  Lab 06/09/23 1106 06/10/23 0417 06/11/23 0804 06/12/23 0452 06/13/23 0440 06/14/23 0451  NA 141 142 142 140 140 142  K 3.4* 3.5 3.3* 4.3 4.2 4.2  CL 108 112* 104 105 106 108  CO2 24 25 27 28 27 26   GLUCOSE 125* 100* 113* 121* 127* 103*  BUN 25* 15 37* 19 27* 24*  CREATININE 0.86 0.60 0.75 0.83 0.61 0.63  CALCIUM  7.8* 7.9* 9.6 8.5* 8.1* 8.8*  MG 2.0  --   --   --   --   --    Liver Function Tests: Recent Labs  Lab 06/08/23 1635 06/09/23 1106  AST 20 19  ALT 16 16  ALKPHOS 96 83  BILITOT 0.9 0.5  PROT 6.0* 5.5*  ALBUMIN 3.3* 2.9*   No results for input(s): "LIPASE", "AMYLASE" in the last 168 hours. No results for input(s): "AMMONIA" in the last 168 hours. CBC: Recent Labs  Lab 06/08/23 1426 06/08/23 1635 06/11/23 0437 06/11/23 0804 06/11/23 1759 06/12/23 0452 06/12/23 1256 06/13/23 0440 06/13/23 1246 06/13/23 2039 06/14/23 0451 06/14/23 1032  WBC 8.0   < > 6.2 9.3  --  7.6  --  10.5  --   --  12.7*  --   NEUTROABS 4.8  --   --   --   --   --   --   --   --   --   --   --   HGB 10.6*   < > 7.3* 6.4*   < > 7.6*   < > 8.5* 8.6* 8.4* 8.4* 8.5*  HCT 32.9*   < > 23.2* 20.2*   < > 23.1*   < > 25.8* 26.0* 25.6* 25.8* 26.4*  MCV 88.2   < > 87.5 88.6  --  87.8  --  83.0  --   --  85.4  --   PLT 289   < >  239 260  --  270  --  220  --   --  243  --    < > = values in this interval not displayed.   Cardiac Enzymes: No results for input(s): "CKTOTAL",  "CKMB", "CKMBINDEX", "TROPONINI" in the last 168 hours. BNP: Invalid input(s): "POCBNP" CBG: No results for input(s): "GLUCAP" in the last 168 hours. D-Dimer No results for input(s): "DDIMER" in the last 72 hours. Hgb A1c No results for input(s): "HGBA1C" in the last 72 hours. Lipid Profile No results for input(s): "CHOL", "HDL", "LDLCALC", "TRIG", "CHOLHDL", "LDLDIRECT" in the last 72 hours. Thyroid  function studies No results for input(s): "TSH", "T4TOTAL", "T3FREE", "THYROIDAB" in the last 72 hours.  Invalid input(s): "FREET3" Anemia work up No results for input(s): "VITAMINB12", "FOLATE", "FERRITIN", "TIBC", "IRON", "RETICCTPCT" in the last 72 hours. Urinalysis    Component Value Date/Time   COLORURINE YELLOW (A) 06/13/2017 2253   APPEARANCEUR HAZY (A) 06/13/2017 2253   APPEARANCEUR Clear 01/22/2014 1210   LABSPEC 1.016 06/13/2017 2253   LABSPEC 1.012 01/22/2014 1210   PHURINE 5.0 06/13/2017 2253   GLUCOSEU NEGATIVE 06/13/2017 2253   GLUCOSEU Negative 01/22/2014 1210   HGBUR NEGATIVE 06/13/2017 2253   BILIRUBINUR NEGATIVE 06/13/2017 2253   BILIRUBINUR negative 12/17/2015 1558   BILIRUBINUR Negative 01/22/2014 1210   KETONESUR NEGATIVE 06/13/2017 2253   PROTEINUR NEGATIVE 06/13/2017 2253   UROBILINOGEN negative 12/17/2015 1558   NITRITE NEGATIVE 06/13/2017 2253   LEUKOCYTESUR TRACE (A) 06/13/2017 2253   LEUKOCYTESUR Trace 01/22/2014 1210   Sepsis Labs Recent Labs  Lab 06/11/23 0804 06/12/23 0452 06/13/23 0440 06/14/23 0451  WBC 9.3 7.6 10.5 12.7*   Microbiology No results found for this or any previous visit (from the past 240 hours). Imaging No results found.    Time coordinating discharge: over 30 minutes  SIGNED:  Lam Mccubbins DO [facility]

## 2023-06-14 NOTE — Plan of Care (Signed)
  Problem: Education: Goal: Knowledge of General Education information will improve Description: Including pain rating scale, medication(s)/side effects and non-pharmacologic comfort measures Outcome: Progressing   Problem: Health Behavior/Discharge Planning: Goal: Ability to manage health-related needs will improve Outcome: Progressing   Problem: Clinical Measurements: Goal: Ability to maintain clinical measurements within normal limits will improve Outcome: Progressing Goal: Will remain free from infection Outcome: Progressing Goal: Diagnostic test results will improve Outcome: Progressing Goal: Respiratory complications will improve Outcome: Progressing Goal: Cardiovascular complication will be avoided Outcome: Progressing   Problem: Activity: Goal: Risk for activity intolerance will decrease Outcome: Progressing   Problem: Nutrition: Goal: Adequate nutrition will be maintained Outcome: Progressing   Problem: Coping: Goal: Level of anxiety will decrease Outcome: Progressing   Problem: Elimination: Goal: Will not experience complications related to bowel motility Outcome: Progressing Goal: Will not experience complications related to urinary retention Outcome: Progressing   Problem: Pain Managment: Goal: General experience of comfort will improve and/or be controlled Outcome: Progressing   Problem: Safety: Goal: Ability to remain free from injury will improve Outcome: Progressing   Problem: Education: Goal: Ability to identify signs and symptoms of gastrointestinal bleeding will improve Outcome: Progressing   Problem: Skin Integrity: Goal: Risk for impaired skin integrity will decrease Outcome: Progressing   Problem: Bowel/Gastric: Goal: Will show no signs and symptoms of gastrointestinal bleeding Outcome: Progressing   Problem: Clinical Measurements: Goal: Complications related to the disease process, condition or treatment will be avoided or  minimized Outcome: Progressing   Problem: Fluid Volume: Goal: Will show no signs and symptoms of excessive bleeding Outcome: Progressing

## 2023-06-14 NOTE — TOC Transition Note (Signed)
 Transition of Care [facility]) - Discharge Note   Patient Details  Name: Catherine Burgess MRN: [id number] Date of Birth: [birth year]  [facility] CM/SW Contact:  Baird Bombard, RN Phone Number: 06/14/2023, 2:43 PM   Clinical Narrative:    Attempt to reach reach patient to arrange [facility] PT, no answer.   Attempt to reach patient's son to arrange [facility] PT/ no answer.            Patient Goals and CMS Choice            Discharge Placement                       Discharge Plan and Services Additional resources added to the After Visit Summary for                                       Social Drivers of Health (SDOH) Interventions SDOH Screenings   Food Insecurity: No Food Insecurity (06/09/2023)  Housing: Low Risk  (06/09/2023)  Transportation Needs: No Transportation Needs (06/09/2023)  Utilities: Not At Risk (06/09/2023)  Alcohol Screen: Low Risk  (09/04/2022)  Depression (PHQ2-9): Low Risk  (04/12/2023)  Financial Resource Strain: Low Risk  (03/19/2023)   Received from [facility]  Physical Activity: Inactive (09/04/2022)  Social Connections: Moderately Integrated (06/09/2023)  Stress: No Stress Concern Present (09/04/2022)  Tobacco Use: Medium Risk (06/10/2023)  Health Literacy: Adequate Health Literacy (09/04/2022)     Readmission Risk Interventions     No data to display

## 2023-06-14 NOTE — Progress Notes (Signed)
 Pt AOX4. No pain, on room air. Discharge order in. PIV x1 removed. AVS/discharge instructions provided to patient. Son at bedside. No questions at this time.

## 2023-06-16 ENCOUNTER — Other Ambulatory Visit: Payer: Self-pay

## 2023-06-16 ENCOUNTER — Emergency Department
Admission: EM | Admit: 2023-06-16 | Discharge: 2023-06-16 | Disposition: A | Discharge status: Home / Self Care | Attending: Emergency Medicine | Admitting: Emergency Medicine

## 2023-06-16 ENCOUNTER — Ambulatory Visit: Payer: Self-pay

## 2023-06-16 DIAGNOSIS — K625 Hemorrhage of anus and rectum: Secondary | ICD-10-CM | POA: Diagnosis not present

## 2023-06-16 LAB — COMPREHENSIVE METABOLIC PANEL WITH GFR
ALT: 16 U/L (ref 0–44)
AST: 22 U/L (ref 15–41)
Albumin: 2.9 g/dL — ABNORMAL LOW (ref 3.5–5.0)
Alkaline Phosphatase: 74 U/L (ref 38–126)
Anion gap: 7 (ref 5–15)
BUN: 19 mg/dL (ref 8–23)
CO2: 25 mmol/L (ref 22–32)
Calcium: 8 mg/dL — ABNORMAL LOW (ref 8.9–10.3)
Chloride: 106 mmol/L (ref 98–111)
Creatinine, Ser: 0.7 mg/dL (ref 0.44–1.00)
GFR, Estimated: >60 mL/min (ref >60)
Glucose, Bld: 137 mg/dL — ABNORMAL HIGH (ref 70–99)
Potassium: 3.6 mmol/L (ref 3.5–5.1)
Sodium: 138 mmol/L (ref 135–145)
Total Bilirubin: 0.8 mg/dL (ref 0.0–1.2)
Total Protein: 5.6 g/dL — ABNORMAL LOW (ref 6.5–8.1)

## 2023-06-16 LAB — CBC
HCT: 25.7 % — ABNORMAL LOW (ref 36.0–46.0)
Hemoglobin: 8.2 g/dL — ABNORMAL LOW (ref 12.0–15.0)
MCH: 28.1 pg (ref 26.0–34.0)
MCHC: 31.9 g/dL (ref 30.0–36.0)
MCV: 88 fL (ref 80.0–100.0)
Platelets: 308 10*3/uL (ref 150–400)
RBC: 2.92 MIL/uL — ABNORMAL LOW (ref 3.87–5.11)
RDW: 17.6 % — ABNORMAL HIGH (ref 11.5–15.5)
WBC: 8.2 10*3/uL (ref 4.0–10.5)
nRBC: 0.6 % — ABNORMAL HIGH (ref 0.0–0.2)

## 2023-06-16 LAB — TYPE AND SCREEN
ABO/RH(D): O POS
Antibody Screen: NEGATIVE
Unit division: 0

## 2023-06-16 LAB — BPAM RBC
Blood Product Expiration Date: 202505302359
Unit Type and Rh: 5100

## 2023-06-16 LAB — HEMOGLOBIN AND HEMATOCRIT, BLOOD
HCT: 26.2 % — ABNORMAL LOW (ref 36.0–46.0)
Hemoglobin: 8.2 g/dL — ABNORMAL LOW (ref 12.0–15.0)

## 2023-06-16 NOTE — Telephone Encounter (Signed)
 FYI patient heading to the ER

## 2023-06-16 NOTE — ED Triage Notes (Signed)
 Pt to ED with son, POV for rectal bleeding since 1 week. Pt was hospitalized last week for same, given blood transfusion and discharged with hgb 8.4. Pt states rectal bleeding has not stopped and noticed it 2 times this morning. Dark red. States legs feel weak. Denies pain. Skin dry, respirations unlabored. Denies dizziness.

## 2023-06-16 NOTE — Telephone Encounter (Signed)
 Copied from CRM [id number]. Topic: Clinical - Red Word Triage >> Jun 16, 2023  9:49 AM Catherine Burgess wrote: Red Word that prompted transfer to Nurse Triage: bleeding from the rectum, calling scheduling hospital was discharged on Monday, and bleeding this morning after using the restroom again   Chief Complaint: Rectal bleeding  Symptoms: Blood in stool, dizziness  Frequency: Single episode today  Pertinent Negatives: Patient denies abdominal pain  Disposition: [x] ED /[] Urgent Care (no appt availability in office) / [] Appointment(In office/virtual)/ []  [place] [facility]/ [] Home Care/ [] Refused Recommended Disposition /[] [place] [facility]/ []  Follow-up with PCP Additional Notes: Patient reports she was admitted for rectal bleeding on 4/30. She states that Monday she was discharged and that she has not had a bowel movement until today. She states that she took a laxative and this morning she had another bloody bowel movement as well as some dizziness. I advised that with her recurrent bleeding and dizziness she should go back to the ED for evaluation. Patient understood and is agreeable with this plan.    Reason for Disposition  Patient sounds very sick or weak to the triager    Rectal bleeding returned, patient feels "woozy"  Answer Assessment - Initial Assessment Questions 1. APPEARANCE of BLOOD: "What color is it?" "Is it passed separately, on the surface of the stool, or mixed in with the stool?"      Bright red blood 2. AMOUNT: "How much blood was passed?"      Unsure  3. FREQUENCY: "How many times has blood been passed with the stools?"      Single time since being home  4. ONSET: "When was the blood first seen in the stools?" (Days or weeks)      This morning  5. DIARRHEA: "Is there also some diarrhea?" If Yes, ask: "How many diarrhea stools in the past 24 hours?"      No 6. CONSTIPATION: "Do you have constipation?" If Yes, ask: "How bad is it?"     Yes, took laxative  7.  RECURRENT SYMPTOMS: "Have you had blood in your stools before?" If Yes, ask: "When was the last time?" and "What happened that time?"      Has similar last week and was admitted to the hospital  8. BLOOD THINNERS: "Do you take any blood thinners?" (e.g., Coumadin/warfarin, Pradaxa/dabigatran, aspirin )     No 9. OTHER SYMPTOMS: "Do you have any other symptoms?"  (e.g., abdomen pain, vomiting, dizziness, fever)     Lightheaded  Protocols used: Rectal Bleeding-A-AH

## 2023-06-16 NOTE — ED Provider Notes (Signed)
 [facility] Provider Note    Event Date/Time   First MD Initiated Contact with Patient 06/16/23 1507     (approximate)   History   Rectal Bleeding   HPI  Catherine Burgess is a 88 y.o. female who presents to the ED for evaluation of Rectal Bleeding   I reviewed medical DC summary from 2 days ago.  Admitted for GI bleeding.  Bidirectional scope on 5/1.  EGD with small hiatal hernia, duodenal diverticulum, colonoscopy with diverticulosis and internal hemorrhoids.  Day after this had additional bleeding With a CTA GI bleed as well as a nuclear tagged RBC scan without clear etiology on either study.  Required another blood transfusion after all of this.  Total 2 units PRBC during this admission.  Discharged with hemoglobin 8.5.  Patient returns to the ED alongside her son for evaluation of recurrence of rectal bleeding 2 episodes in the past 12 hours.  Reports taking a single dose of MiraLAX  Monday night, passing stool earlier this morning and again around lunchtime today, both bloody and blood on the toilet tissue when she wipes.  No abdominal pain, emesis or other bleeding diatheses.  Some mild generalized weakness but no syncope.   Physical Exam   Triage Vital Signs: ED Triage Vitals  Encounter Vitals Group     BP 06/16/23 1224 112/60     Systolic BP Percentile --      Diastolic BP Percentile --      Pulse Rate 06/16/23 1224 90     Resp 06/16/23 1224 20     Temp 06/16/23 1224 98.4 F (36.9 C)     Temp Source 06/16/23 1224 Oral     SpO2 06/16/23 1224 100 %     Weight 06/16/23 1224 137 lb 2 oz (62.2 kg)     Height 06/16/23 1224 5\' 4"  (1.626 m)     Head Circumference --      Peak Flow --      Pain Score 06/16/23 1222 0     Pain Loc --      Pain Education --      Exclude from Growth Chart --     Most recent vital signs: Vitals:   06/16/23 1224 06/16/23 1631  BP: 112/60   Pulse: 90   Resp: 20   Temp: 98.4 F (36.9 C) 98.4 F (36.9 C)  SpO2:  100%     General: Awake, no distress.  Pleasant and conversational, well-appearing CV:  Good peripheral perfusion.  Resp:  Normal effort.  Abd:  No distention.  Soft and benign throughout without any tenderness, guarding or peritoneal features. MSK:  No deformity noted.  Neuro:  No focal deficits appreciated. Other:     ED Results / Procedures / Treatments   Labs (all labs ordered are listed, but only abnormal results are displayed) Labs Reviewed  COMPREHENSIVE METABOLIC PANEL WITH GFR - Abnormal; Notable for the following components:      Result Value   Glucose, Bld 137 (*)    Calcium  8.0 (*)    Total Protein 5.6 (*)    Albumin 2.9 (*)    All other components within normal limits  CBC - Abnormal; Notable for the following components:   RBC 2.92 (*)    Hemoglobin 8.2 (*)    HCT 25.7 (*)    RDW 17.6 (*)    nRBC 0.6 (*)    All other components within normal limits  HEMOGLOBIN AND HEMATOCRIT, BLOOD - Abnormal;  Notable for the following components:   Hemoglobin 8.2 (*)    HCT 26.2 (*)    All other components within normal limits  TYPE AND SCREEN    EKG   RADIOLOGY   Official radiology report(s): No results found.  PROCEDURES and INTERVENTIONS:  Procedures  Medications - No data to display   IMPRESSION / MDM / ASSESSMENT AND PLAN / ED COURSE  I reviewed the triage vital signs and the nursing notes.  Differential diagnosis includes, but is not limited to, diverticular bleed, symptomatic anemia, bleeding hemorrhoids, brisk upper GI bleeding.  {Patient presents with symptoms of an acute illness or injury that is potentially life-threatening.   Patient presents with lower GI bleeding after recent extensive workup for the same.  No active bleeding.  Observed for nearly 5 hours without recurrence.  Stable hemoglobin on rechecks.  I consult GI.  I consider admission for this patient ultimately we will discharge with close return precautions.  No metabolic  derangements.  Clinical Course as of 06/16/23 1650  Wed Jun 16, 2023  1531 I consult with Dr. Corky Diener, GI on call. Nothing to actively do at this point and the extent of recent workup.  Agrees with monitoring, repeat H&H.  If she ends up getting admitted he asks that I let him know and they will follow [DS]  1649 Reassessed.  Patient feels well.  No bleeding episodes.  Repeat hemoglobin is stable at 8.2.  Shared decision making with patient, her son and good friends.  She is comfortable going home, which I think is reasonable.  We discussed expectant management, possible need to return to the ED if her bleeding were to recur.  PCP follow-up.  Answered questions.  Suitable for outpatient management. [DS]    Clinical Course User Index [DS] Arline Bennett, MD     FINAL CLINICAL IMPRESSION(S) / ED DIAGNOSES   Final diagnoses:  Rectal bleeding     Rx / DC Orders   ED Discharge Orders     None        Note:  This document was prepared using Dragon voice recognition software and may include unintentional dictation errors.   Arline Bennett, MD 06/16/23 [phone]

## 2023-06-16 NOTE — Discharge Instructions (Signed)
 If that bleeding comes back or you have worsening symptoms of dizziness, passing out, trouble breathing or other concerns then please return to the ED.

## 2023-06-17 LAB — PREPARE RBC (CROSSMATCH)

## 2023-06-21 ENCOUNTER — Ambulatory Visit: Admitting: Family Medicine

## 2023-06-21 ENCOUNTER — Encounter: Payer: Self-pay | Admitting: Family Medicine

## 2023-06-21 VITALS — BP 132/70 | HR 90 | Resp 16 | Ht 64.0 in | Wt 135.5 lb

## 2023-06-21 DIAGNOSIS — Z9289 Personal history of other medical treatment: Secondary | ICD-10-CM

## 2023-06-21 DIAGNOSIS — D62 Acute posthemorrhagic anemia: Secondary | ICD-10-CM

## 2023-06-21 DIAGNOSIS — K219 Gastro-esophageal reflux disease without esophagitis: Secondary | ICD-10-CM | POA: Diagnosis not present

## 2023-06-21 DIAGNOSIS — Z8719 Personal history of other diseases of the digestive system: Secondary | ICD-10-CM

## 2023-06-21 DIAGNOSIS — K648 Other hemorrhoids: Secondary | ICD-10-CM

## 2023-06-21 DIAGNOSIS — Z09 Encounter for follow-up examination after completed treatment for conditions other than malignant neoplasm: Secondary | ICD-10-CM | POA: Diagnosis not present

## 2023-06-21 LAB — COMPREHENSIVE METABOLIC PANEL WITH GFR
AG Ratio: 1.5 (calc) (ref 1.0–2.5)
ALT: 15 U/L (ref 6–29)
AST: 18 U/L (ref 10–35)
Albumin: 3.4 g/dL — ABNORMAL LOW (ref 3.6–5.1)
Alkaline phosphatase (APISO): 130 U/L (ref 37–153)
BUN: 18 mg/dL (ref 7–25)
CO2: 28 mmol/L (ref 20–32)
Calcium: 8.6 mg/dL (ref 8.6–10.4)
Chloride: 108 mmol/L (ref 98–110)
Creat: 0.71 mg/dL (ref 0.60–0.95)
Globulin: 2.3 g/dL (ref 1.9–3.7)
Glucose, Bld: 89 mg/dL (ref 65–99)
Potassium: 4.1 mmol/L (ref 3.5–5.3)
Sodium: 141 mmol/L (ref 135–146)
Total Bilirubin: 0.4 mg/dL (ref 0.2–1.2)
Total Protein: 5.7 g/dL — ABNORMAL LOW (ref 6.1–8.1)
eGFR: 83 mL/min/{1.73_m2} (ref >=60)

## 2023-06-21 LAB — CBC WITH DIFFERENTIAL/PLATELET
Absolute Lymphocytes: 1360 {cells}/uL (ref 850–3900)
Absolute Monocytes: 584 {cells}/uL (ref 200–950)
Basophils Absolute: 48 {cells}/uL (ref 0–200)
Basophils Relative: 0.6 %
Eosinophils Absolute: 200 {cells}/uL (ref 15–500)
Eosinophils Relative: 2.5 %
HCT: 28.7 % — ABNORMAL LOW (ref 35.0–45.0)
Hemoglobin: 8.8 g/dL — ABNORMAL LOW (ref 11.7–15.5)
MCH: 27 pg (ref 27.0–33.0)
MCHC: 30.7 g/dL — ABNORMAL LOW (ref 32.0–36.0)
MCV: 88 fL (ref 80.0–100.0)
MPV: 10.4 fL (ref 7.5–12.5)
Monocytes Relative: 7.3 %
Neutro Abs: 5808 {cells}/uL (ref 1500–7800)
Neutrophils Relative %: 72.6 %
Platelets: 473 10*3/uL — ABNORMAL HIGH (ref 140–400)
RBC: 3.26 10*6/uL — ABNORMAL LOW (ref 3.80–5.10)
RDW: 15.2 % — ABNORMAL HIGH (ref 11.0–15.0)
Total Lymphocyte: 17 %
WBC: 8 10*3/uL (ref 3.8–10.8)

## 2023-06-21 MED ORDER — PANTOPRAZOLE SODIUM 40 MG PO TBEC: 40.0000 mg | DELAYED_RELEASE_TABLET | Freq: Two times a day (BID) | ORAL | 0 refills | Status: DC

## 2023-06-21 MED ORDER — POLYETHYLENE GLYCOL 3350 17 GM/SCOOP PO POWD: 17.0000 g | Freq: Two times a day (BID) | ORAL | 1 refills | Status: AC | PRN

## 2023-06-21 NOTE — Progress Notes (Signed)
 Name: Catherine Burgess   MRN: [id number]    DOB: 1935-06-30   Date:06/21/2023       Progress Note  Subjective  Chief Complaint  Chief Complaint  Patient presents with   Hospitalization Follow-up    Rectal bleeding has stopped but still feeling weak   Discussed the use of AI scribe software for clinical note transcription with the patient, who gave verbal consent to proceed.  History of Present Illness Catherine Burgess is an 88 year old female who presents for a hospital follow-up after a gastrointestinal bleed.  Her gastrointestinal bleeding episode began on the night of April 27th, with red blood in the toilet bowl and on wiping after using the bathroom. The bleeding persisted into the morning of April 28th, leading to a significant drop in hemoglobin from 14 in February to 10.6, prompting a hospital admission.  During her hospital stay from April 30th to May 5th, she received two blood transfusions as her hemoglobin continued to drop, reaching as low as 8.9. An EGD and colonoscopy revealed a small hiatal hernia and non-bleeding diverticula in the duodenum and colon. No active bleeding source was identified.  On May 7th, she experienced another episode of bleeding and visited the emergency room, but was discharged as her hemoglobin was stable at 8.8. She feels fatigued and short of breath with activity. She was sent home on increased dosage of pantoprazole , 4o mg to take BID but is still taking only one daily. She was also advised to stop taking aspirin  ( she stopped ) and start miralax  due to internal hemorrhoids but has not started it yet  She has not yet followed up with her gastroenterologist No current stomach pain or blood in stools. She attributes her fatigue and shortness of breath to anemia. She is taking ferrous sulfate  325 mg daily  Medication reconciliation was done  TOC call completed within 48 hours of discharge    Patient Active Problem List   Diagnosis Date Noted    Acute blood loss anemia 06/09/2023   B12 deficiency 06/01/2022   Peripheral polyneuropathy 06/01/2022   Dyslipidemia associated with type 2 diabetes mellitus (HCC) 06/01/2022   PAD (peripheral artery disease) (HCC) 01/30/2022   Diabetes mellitus type 2, diet-controlled (HCC) 05/11/2021   Abnormal ankle brachial index (ABI) 03/02/2018   Calcified cerebral meningioma (HCC) 10/04/2017   History of CVA (cerebrovascular accident) without residual deficits 10/04/2017   Subacromial impingement of right shoulder 10/04/2017   History of anemia 10/13/2016   Abnormal CT of brain 07/23/2016   Vitamin D  deficiency 10/28/2015   Post menopausal syndrome 10/28/2015   Osteopenia of multiple sites 10/28/2015   Cervical disc disease 10/28/2015   H/O total knee replacement 01/20/2015   Vaginal atrophy 09/26/2014   Osteoarthritis of both knees 07/31/2014   GERD without esophagitis 07/31/2014   Essential hypertension 07/31/2014    Past Surgical History:  Procedure Laterality Date   CATARACT EXTRACTION, BILATERAL     COLONOSCOPY N/A 06/10/2023   Procedure: COLONOSCOPY;  Surgeon: Marnee Sink, MD;  Location: [facility] ENDOSCOPY;  Service: Endoscopy;  Laterality: N/A;   COLONOSCOPY WITH PROPOFOL  N/A 06/20/2020   Procedure: COLONOSCOPY WITH PROPOFOL ;  Surgeon: Selena Daily, MD;  Location: [facility] ENDOSCOPY;  Service: Gastroenterology;  Laterality: N/A;   ESOPHAGOGASTRODUODENOSCOPY N/A 06/10/2023   Procedure: EGD (ESOPHAGOGASTRODUODENOSCOPY);  Surgeon: Marnee Sink, MD;  Location: [facility] ENDOSCOPY;  Service: Endoscopy;  Laterality: N/A;   EYE SURGERY     HIP PINNING Left 1956   JOINT REPLACEMENT  REPLACEMENT TOTAL KNEE Left 11/12/2009   TOTAL KNEE REVISION Left 12/07/2016   Procedure: TOTAL KNEE REVISION;  Surgeon: Arlyne Lame, MD;  Location: [facility] ORS;  Service: Orthopedics;  Laterality: Left;    Family History  Problem Relation Age of Onset   Emphysema Father        Smoker   CAD Mother    Stroke  Mother    Diabetes Brother    Seizures Brother    Cancer Brother        unknown   Diabetes Brother     Social History   Tobacco Use   Smoking status: Former    Current packs/day: 0.00    Average packs/day: 0.3 packs/day for 1 year (0.3 ttl pk-yrs)    Types: Cigarettes    Start date: 59    Quit date: 1966    Years since quitting: 59.4   Smokeless tobacco: Never   Tobacco comments:    smoking cessation materials not required  Substance Use Topics   Alcohol use: No    Alcohol/week: 0.0 standard drinks of alcohol     Current Outpatient Medications:    acetaminophen  (TYLENOL ) 500 MG tablet, Take 1 tablet (500 mg total) by mouth 2 (two) times daily. (Patient taking differently: Take 500 mg by mouth every 6 (six) hours as needed for mild pain (pain score 1-3).), Disp: 180 tablet, Rfl: 0   amLODipine  (NORVASC ) 2.5 MG tablet, Take 1 tablet (2.5 mg total) by mouth daily., Disp: 90 tablet, Rfl: 1   atorvastatin  (LIPITOR) 40 MG tablet, Take 1 tablet (40 mg total) by mouth daily. TAKE 1 TABLET(40 MG) BY MOUTH DAILY, Disp: 90 tablet, Rfl: 1   Calcium -Vitamin D  600-200 MG-UNIT tablet, Take 1 tablet by mouth daily. , Disp: , Rfl:    ferrous sulfate  325 (65 FE) MG EC tablet, Take 1 tablet (325 mg total) by mouth daily with breakfast., Disp: 90 tablet, Rfl: 0   fluticasone  (FLONASE ) 50 MCG/ACT nasal spray, Place into the nose., Disp: , Rfl:    losartan  (COZAAR ) 50 MG tablet, Take 1 tablet (50 mg total) by mouth daily., Disp: 90 tablet, Rfl: 1   tiZANidine  (ZANAFLEX ) 2 MG tablet, Take 1 tablet (2 mg total) by mouth at bedtime. PRN, Disp: 90 tablet, Rfl: 1   gabapentin  (NEURONTIN ) 300 MG capsule, Take 1 capsule (300 mg total) by mouth every morning AND 2 capsules (600 mg total) at bedtime., Disp: 270 capsule, Rfl: 1   pantoprazole  (PROTONIX ) 40 MG tablet, Take 1 tablet (40 mg total) by mouth 2 (two) times daily before a meal., Disp: 60 tablet, Rfl: 0  Allergies  Allergen Reactions   Lovastatin  Other (See Comments)    chest   Pravastatin     Other reaction(s): Muscle Pain chest   Rosuvastatin     Other reaction(s): Muscle Pain chest Other reaction(s): Muscle Pain chest   Statins     Other reaction(s): Muscle Pain chest    I personally reviewed active problem list, medication list, allergies with the patient/caregiver today.   ROS  Ten systems reviewed and is negative except as mentioned in HPI    Objective Physical Exam Constitutional: Patient appears well-developed and well-nourished.  No distress.  HEENT: head atraumatic, normocephalic, pupils equal and reactive to light, ears , neck supple Cardiovascular: Normal rate, regular rhythm and normal heart sounds.  No murmur heard. No BLE edema. Pulmonary/Chest: Effort normal and breath sounds normal. No respiratory distress. Abdominal: Soft.  There is no tenderness.  Psychiatric: Patient has a normal mood and affect. behavior is normal. Judgment and thought content normal.    Vitals:   06/21/23 0959  BP: 132/70  Pulse: 90  Resp: 16  Weight: 135 lb 8 oz (61.5 kg)  Height: 5\' 4"  (1.626 m)    Body mass index is 23.26 kg/m.  Recent Results (from the past 2160 hours)  CBC with Differential     Status: None   Collection Time: 04/05/23  3:44 PM  Result Value Ref Range   WBC 7.2 4.0 - 10.5 K/uL   RBC 5.00 3.87 - 5.11 MIL/uL   Hemoglobin 14.0 12.0 - 15.0 g/dL   HCT 14.7 82.9 - 56.2 %   MCV 88.6 80.0 - 100.0 fL   MCH 28.0 26.0 - 34.0 pg   MCHC 31.6 30.0 - 36.0 g/dL   RDW 13.0 86.5 - 78.4 %   Platelets 311 150 - 400 K/uL   nRBC 0.0 0.0 - 0.2 %   Neutrophils Relative % 59 %   Neutro Abs 4.3 1.7 - 7.7 K/uL   Lymphocytes Relative 31 %   Lymphs Abs 2.2 0.7 - 4.0 K/uL   Monocytes Relative 7 %   Monocytes Absolute 0.5 0.1 - 1.0 K/uL   Eosinophils Relative 2 %   Eosinophils Absolute 0.1 0.0 - 0.5 K/uL   Basophils Relative 1 %   Basophils Absolute 0.1 0.0 - 0.1 K/uL   Immature Granulocytes 0 %   Abs Immature  Granulocytes 0.01 0.00 - 0.07 K/uL    Comment: Performed at [facility], [address] [place], [place] [address]  Comprehensive metabolic panel     Status: Abnormal   Collection Time: 04/05/23  3:44 PM  Result Value Ref Range   Sodium 142 135 - 145 mmol/L   Potassium 3.8 3.5 - 5.1 mmol/L   Chloride 104 98 - 111 mmol/L   CO2 28 22 - 32 mmol/L   Glucose, Bld 106 (H) 70 - 99 mg/dL    Comment: Glucose reference range applies only to samples taken after fasting for at least 8 hours.   BUN 16 8 - 23 mg/dL   Creatinine, Ser 5.28 0.44 - 1.00 mg/dL   Calcium  8.8 (L) 8.9 - 10.3 mg/dL   Total Protein 6.9 6.5 - 8.1 g/dL   Albumin 3.8 3.5 - 5.0 g/dL   AST 22 15 - 41 U/L   ALT 21 0 - 44 U/L   Alkaline Phosphatase 122 38 - 126 U/L   Total Bilirubin 1.0 0.0 - 1.2 mg/dL   GFR, Estimated >41 >32 mL/min    Comment: (NOTE) Calculated using the CKD-EPI Creatinine Equation (2021)    Anion gap 10 5 - 15    Comment: Performed at [facility], [address] [place], [place] [address]  POCT glycosylated hemoglobin (Hb A1C)     Status: Abnormal   Collection Time: 05/21/23 11:11 AM  Result Value Ref Range   Hemoglobin A1C 6.3 (A) 4.0 - 5.6 %   HbA1c POC (<> result, manual entry)     HbA1c, POC (prediabetic range)     HbA1c, POC (controlled diabetic range)    CBC with Differential/Platelet     Status: Abnormal   Collection Time: 06/08/23  2:26 PM  Result Value Ref Range   WBC 8.0 4.0 - 10.5 K/uL   RBC 3.73 (L) 3.87 - 5.11 MIL/uL   Hemoglobin 10.6 (L) 12.0 - 15.0 g/dL   HCT 27.2 (L) 53.6 - 64.4 %  MCV 88.2 80.0 - 100.0 fL   MCH 28.4 26.0 - 34.0 pg   MCHC 32.2 30.0 - 36.0 g/dL   RDW 16.1 09.6 - 04.5 %   Platelets 289 150 - 400 K/uL   nRBC 0.0 0.0 - 0.2 %   Neutrophils Relative % 60 %   Neutro Abs 4.8 1.7 - 7.7 K/uL   Lymphocytes Relative 30 %   Lymphs Abs 2.4 0.7 - 4.0 K/uL   Monocytes Relative 8 %   Monocytes Absolute 0.6 0.1 - 1.0 K/uL   Eosinophils Relative 2 %    Eosinophils Absolute 0.1 0.0 - 0.5 K/uL   Basophils Relative 0 %   Basophils Absolute 0.0 0.0 - 0.1 K/uL   Immature Granulocytes 0 %   Abs Immature Granulocytes 0.02 0.00 - 0.07 K/uL    Comment: Performed at [facility], [address] [place], [place] [address]  Comprehensive metabolic panel     Status: Abnormal   Collection Time: 06/08/23  4:35 PM  Result Value Ref Range   Sodium 140 135 - 145 mmol/L   Potassium 3.5 3.5 - 5.1 mmol/L   Chloride 103 98 - 111 mmol/L   CO2 24 22 - 32 mmol/L   Glucose, Bld 151 (H) 70 - 99 mg/dL    Comment: Glucose reference range applies only to samples taken after fasting for at least 8 hours.   BUN 24 (H) 8 - 23 mg/dL   Creatinine, Ser 1.91 0.44 - 1.00 mg/dL   Calcium  8.7 (L) 8.9 - 10.3 mg/dL   Total Protein 6.0 (L) 6.5 - 8.1 g/dL   Albumin 3.3 (L) 3.5 - 5.0 g/dL   AST 20 15 - 41 U/L   ALT 16 0 - 44 U/L   Alkaline Phosphatase 96 38 - 126 U/L   Total Bilirubin 0.9 0.0 - 1.2 mg/dL   GFR, Estimated >47 >82 mL/min    Comment: (NOTE) Calculated using the CKD-EPI Creatinine Equation (2021)    Anion gap 13 5 - 15    Comment: Performed at [facility], [address] [place], [place] [address]  CBC     Status: Abnormal   Collection Time: 06/08/23  4:35 PM  Result Value Ref Range   WBC 7.1 4.0 - 10.5 K/uL   RBC 3.87 3.87 - 5.11 MIL/uL   Hemoglobin 10.8 (L) 12.0 - 15.0 g/dL   HCT 30.8 (L) 65.7 - 84.6 %   MCV 88.9 80.0 - 100.0 fL   MCH 27.9 26.0 - 34.0 pg   MCHC 31.4 30.0 - 36.0 g/dL   RDW 96.2 95.2 - 84.1 %   Platelets 307 150 - 400 K/uL   nRBC 0.0 0.0 - 0.2 %    Comment: Performed at [facility], [address] [place], [place] [address]  Type and screen [facility]     Status: None   Collection Time: 06/08/23  4:35 PM  Result Value Ref Range   ABO/RH(D) O POS    Antibody Screen NEG    Sample Expiration      06/08/2023,2359 Performed at [facility], [address]  [place], [place] [address]   CBC     Status: Abnormal   Collection Time: 06/08/23 10:23 PM  Result Value Ref Range   WBC 7.1 4.0 - 10.5 K/uL   RBC 3.36 (L) 3.87 - 5.11 MIL/uL   Hemoglobin 9.4 (L) 12.0 - 15.0 g/dL   HCT 53.6 (L) 64.4 - 03.4 %  MCV 86.9 80.0 - 100.0 fL   MCH 28.0 26.0 - 34.0 pg   MCHC 32.2 30.0 - 36.0 g/dL   RDW 91.4 78.2 - 95.6 %   Platelets 260 150 - 400 K/uL   nRBC 0.0 0.0 - 0.2 %    Comment: Performed at [facility], [address] [place], [place] [address]  Comprehensive metabolic panel     Status: Abnormal   Collection Time: 06/09/23 11:06 AM  Result Value Ref Range   Sodium 141 135 - 145 mmol/L    Comment: ELECTROLYTES REPEATED TO VERIFY MW   Potassium 3.4 (L) 3.5 - 5.1 mmol/L   Chloride 108 98 - 111 mmol/L   CO2 24 22 - 32 mmol/L   Glucose, Bld 125 (H) 70 - 99 mg/dL    Comment: Glucose reference range applies only to samples taken after fasting for at least 8 hours.   BUN 25 (H) 8 - 23 mg/dL   Creatinine, Ser 6.57 0.44 - 1.00 mg/dL   Calcium  7.8 (L) 8.9 - 10.3 mg/dL   Total Protein 5.5 (L) 6.5 - 8.1 g/dL   Albumin 2.9 (L) 3.5 - 5.0 g/dL   AST 19 15 - 41 U/L   ALT 16 0 - 44 U/L   Alkaline Phosphatase 83 38 - 126 U/L   Total Bilirubin 0.5 0.0 - 1.2 mg/dL   GFR, Estimated >84 >69 mL/min    Comment: (NOTE) Calculated using the CKD-EPI Creatinine Equation (2021)    Anion gap 9 5 - 15    Comment: Performed at [facility], [address] [place], [place] [address]  CBC     Status: Abnormal   Collection Time: 06/09/23 11:06 AM  Result Value Ref Range   WBC 7.2 4.0 - 10.5 K/uL   RBC 3.14 (L) 3.87 - 5.11 MIL/uL   Hemoglobin 8.9 (L) 12.0 - 15.0 g/dL   HCT 84.1 (L) 32.4 - 40.1 %   MCV 88.9 80.0 - 100.0 fL   MCH 28.3 26.0 - 34.0 pg   MCHC 31.9 30.0 - 36.0 g/dL   RDW 02.7 25.3 - 66.4 %   Platelets 297 150 - 400 K/uL   nRBC 0.0 0.0 - 0.2 %    Comment: Performed at [facility], [address] [place], [place] [address]   Type and screen [facility]     Status: None   Collection Time: 06/09/23 11:06 AM  Result Value Ref Range   ABO/RH(D) O POS    Antibody Screen NEG    Sample Expiration 06/12/2023,2359    Unit Number [id number]    Blood Component Type RBC LR PHER1    Unit division 00    Status of Unit REL FROM [facility]    Transfusion Status OK TO TRANSFUSE    Crossmatch Result Compatible    Unit Number [id number]    Blood Component Type RED CELLS,LR    Unit division 00    Status of Unit ISSUED,FINAL    Transfusion Status OK TO TRANSFUSE    Crossmatch Result      Compatible Performed at [facility], [address] [place], [place] [address]    Unit Number [id number]    Blood Component Type RED CELLS,LR    Unit division 00    Status of Unit ISSUED,FINAL    Transfusion Status OK TO TRANSFUSE    Crossmatch Result Compatible   Magnesium      Status: None   Collection Time: 06/09/23 11:06  AM  Result Value Ref Range   Magnesium  2.0 1.7 - 2.4 mg/dL    Comment: Performed at [facility], [address] [place], [place] [address]  BPAM RBC     Status: None   Collection Time: 06/09/23 11:06 AM  Result Value Ref Range   Blood Product Unit Number [id number]    PRODUCT CODE [id number]    Unit Type and Rh 5100    Blood Product Expiration Date [id number]    ISSUE DATE / TIME 952841324401    Blood Product Unit Number [id number]    PRODUCT CODE [id number]    Unit Type and Rh 5100    Blood Product Expiration Date [id number]    ISSUE DATE / TIME 433295188416    Blood Product Unit Number [id number]    PRODUCT CODE [id number]    Unit Type and Rh 5100    Blood Product Expiration Date 254270623762   Basic metabolic panel     Status: Abnormal   Collection Time: 06/10/23  4:17 AM  Result Value Ref Range   Sodium 142 135 - 145 mmol/L   Potassium 3.5 3.5 - 5.1 mmol/L   Chloride 112 (H) 98 - 111 mmol/L   CO2 25 22 - 32 mmol/L   Glucose, Bld 100 (H)  70 - 99 mg/dL    Comment: Glucose reference range applies only to samples taken after fasting for at least 8 hours.   BUN 15 8 - 23 mg/dL   Creatinine, Ser 8.31 0.44 - 1.00 mg/dL   Calcium  7.9 (L) 8.9 - 10.3 mg/dL   GFR, Estimated >51 >76 mL/min    Comment: (NOTE) Calculated using the CKD-EPI Creatinine Equation (2021)    Anion gap 5 5 - 15    Comment: Performed at [facility], [address] [place], [place] [address]  CBC     Status: Abnormal   Collection Time: 06/10/23  4:17 AM  Result Value Ref Range   WBC 5.5 4.0 - 10.5 K/uL   RBC 2.74 (L) 3.87 - 5.11 MIL/uL   Hemoglobin 7.7 (L) 12.0 - 15.0 g/dL   HCT 71.0 (L) 62.6 - 94.8 %   MCV 87.2 80.0 - 100.0 fL   MCH 28.1 26.0 - 34.0 pg   MCHC 32.2 30.0 - 36.0 g/dL   RDW 54.6 27.0 - 35.0 %   Platelets 225 150 - 400 K/uL   nRBC 0.0 0.0 - 0.2 %    Comment: Performed at [facility], [address] [place], [place] [address]  CBC     Status: Abnormal   Collection Time: 06/11/23  4:37 AM  Result Value Ref Range   WBC 6.2 4.0 - 10.5 K/uL   RBC 2.65 (L) 3.87 - 5.11 MIL/uL   Hemoglobin 7.3 (L) 12.0 - 15.0 g/dL   HCT 82.9 (L) 93.7 - 16.9 %   MCV 87.5 80.0 - 100.0 fL   MCH 27.5 26.0 - 34.0 pg   MCHC 31.5 30.0 - 36.0 g/dL   RDW 67.8 93.8 - 10.1 %   Platelets 239 150 - 400 K/uL   nRBC 0.0 0.0 - 0.2 %    Comment: Performed at [facility], [address] [place], [place] [address]  CBC     Status: Abnormal   Collection Time: 06/11/23  8:04 AM  Result Value Ref Range   WBC 9.3 4.0 - 10.5 K/uL   RBC 2.28 (L) 3.87 - 5.11 MIL/uL   Hemoglobin 6.4 (L) 12.0 - 15.0 g/dL  HCT 20.2 (L) 36.0 - 46.0 %   MCV 88.6 80.0 - 100.0 fL   MCH 28.1 26.0 - 34.0 pg   MCHC 31.7 30.0 - 36.0 g/dL   RDW 14.7 82.9 - 56.2 %   Platelets 260 150 - 400 K/uL   nRBC 0.0 0.0 - 0.2 %    Comment: Performed at [facility], [address], [place], [place] [address]  Basic metabolic panel with GFR     Status: Abnormal    Collection Time: 06/11/23  8:04 AM  Result Value Ref Range   Sodium 142 135 - 145 mmol/L   Potassium 3.3 (L) 3.5 - 5.1 mmol/L   Chloride 104 98 - 111 mmol/L   CO2 27 22 - 32 mmol/L   Glucose, Bld 113 (H) 70 - 99 mg/dL    Comment: Glucose reference range applies only to samples taken after fasting for at least 8 hours.   BUN 37 (H) 8 - 23 mg/dL   Creatinine, Ser 5.78 0.44 - 1.00 mg/dL   Calcium  9.6 8.9 - 10.3 mg/dL   GFR, Estimated >46 >96 mL/min    Comment: (NOTE) Calculated using the CKD-EPI Creatinine Equation (2021)    Anion gap 11 5 - 15    Comment: Performed at [facility], [address], [place], [place] [address]  Troponin I (High Sensitivity)     Status: None   Collection Time: 06/11/23  8:04 AM  Result Value Ref Range   Troponin I (High Sensitivity) 10 <18 ng/L    Comment: (NOTE) Elevated high sensitivity troponin I (hsTnI) values and significant  changes across serial measurements may suggest ACS but many other  chronic and acute conditions are known to elevate hsTnI results.  Refer to the "Links" section for chest pain algorithms and additional  guidance. Performed at [facility], [address] [place], [place] [address]   Prepare RBC (crossmatch)     Status: None   Collection Time: 06/11/23 10:00 AM  Result Value Ref Range   Order Confirmation      ORDER PROCESSED BY BLOOD BANK Performed at [facility], [address] [place], [place] [address]   Hemoglobin and hematocrit, blood     Status: Abnormal   Collection Time: 06/11/23  5:59 PM  Result Value Ref Range   Hemoglobin 8.4 (L) 12.0 - 15.0 g/dL   HCT 72.5 (L) 36.6 - 44.0 %    Comment: Performed at [facility], [address] [place], [place] [address]  CBC     Status: Abnormal   Collection Time: 06/12/23  4:52 AM  Result Value Ref Range   WBC 7.6 4.0 - 10.5 K/uL   RBC 2.63 (L) 3.87 - 5.11 MIL/uL   Hemoglobin 7.6 (L) 12.0 - 15.0 g/dL   HCT 59.5 (L) 63.8 -  46.0 %   MCV 87.8 80.0 - 100.0 fL   MCH 28.9 26.0 - 34.0 pg   MCHC 32.9 30.0 - 36.0 g/dL   RDW 75.6 43.3 - 29.5 %   Platelets 270 150 - 400 K/uL   nRBC 0.0 0.0 - 0.2 %    Comment: Performed at [facility], [address] [place], [place] [address]  Basic metabolic panel with GFR     Status: Abnormal   Collection Time: 06/12/23  4:52 AM  Result Value Ref Range   Sodium 140 135 - 145 mmol/L   Potassium 4.3 3.5 - 5.1 mmol/L   Chloride 105 98 -  111 mmol/L   CO2 28 22 - 32 mmol/L   Glucose, Bld 121 (H) 70 - 99 mg/dL    Comment: Glucose reference range applies only to samples taken after fasting for at least 8 hours.   BUN 19 8 - 23 mg/dL   Creatinine, Ser 1.61 0.44 - 1.00 mg/dL   Calcium  8.5 (L) 8.9 - 10.3 mg/dL   GFR, Estimated >09 >60 mL/min    Comment: (NOTE) Calculated using the CKD-EPI Creatinine Equation (2021)    Anion gap 7 5 - 15    Comment: Performed at [facility], [address] [place], [place] [address]  Hemoglobin and hematocrit, blood     Status: Abnormal   Collection Time: 06/12/23 12:56 PM  Result Value Ref Range   Hemoglobin 7.5 (L) 12.0 - 15.0 g/dL   HCT 81.1 (L) 91.4 - 78.2 %    Comment: Performed at [facility], [address] [place], [place] [address]  Hemoglobin and hematocrit, blood     Status: Abnormal   Collection Time: 06/12/23  9:28 PM  Result Value Ref Range   Hemoglobin 6.7 (L) 12.0 - 15.0 g/dL   HCT 30.8 (L) 65.7 - 84.6 %    Comment: Performed at [facility], [address], [place], [place] [address]  Prepare RBC (crossmatch)     Status: None   Collection Time: 06/12/23 10:41 PM  Result Value Ref Range   Order Confirmation      DUPLICATE REQUEST BB SAMPLE OR UNITS ALREADY AVAILABLE Performed at [facility], [address] [place], [place] [address]   Type and screen [facility]     Status: None   Collection Time: 06/12/23 11:04 PM  Result Value Ref Range    ABO/RH(D) O POS    Antibody Screen NEG    Sample Expiration 06/15/2023,2359    Unit Number [id number]    Blood Component Type RBC LR PHER1    Unit division 00    Status of Unit REL FROM [facility]    Transfusion Status OK TO TRANSFUSE    Crossmatch Result      Compatible Performed at [facility], [address] [place], [place] [address]   BPAM RBC     Status: None   Collection Time: 06/12/23 11:04 PM  Result Value Ref Range   Blood Product Unit Number [id number]    PRODUCT CODE [id number]    Unit Type and Rh 5100    Blood Product Expiration Date [id number]   CBC     Status: Abnormal   Collection Time: 06/13/23  4:40 AM  Result Value Ref Range   WBC 10.5 4.0 - 10.5 K/uL   RBC 3.11 (L) 3.87 - 5.11 MIL/uL   Hemoglobin 8.5 (L) 12.0 - 15.0 g/dL    Comment: REPEATED TO VERIFY   HCT 25.8 (L) 36.0 - 46.0 %   MCV 83.0 80.0 - 100.0 fL   MCH 27.3 26.0 - 34.0 pg   MCHC 32.9 30.0 - 36.0 g/dL   RDW 09.3 (H) 23.5 - 57.3 %   Platelets 220 150 - 400 K/uL   nRBC 0.7 (H) 0.0 - 0.2 %    Comment: Performed at [facility], [address] [place], [place] [address]  Basic metabolic panel with GFR     Status: Abnormal   Collection Time: 06/13/23  4:40 AM  Result Value Ref Range   Sodium 140 135 - 145 mmol/L   Potassium 4.2 3.5 -  5.1 mmol/L   Chloride 106 98 - 111 mmol/L   CO2 27 22 - 32 mmol/L   Glucose, Bld 127 (H) 70 - 99 mg/dL    Comment: Glucose reference range applies only to samples taken after fasting for at least 8 hours.   BUN 27 (H) 8 - 23 mg/dL   Creatinine, Ser 1.91 0.44 - 1.00 mg/dL   Calcium  8.1 (L) 8.9 - 10.3 mg/dL   GFR, Estimated >47 >82 mL/min    Comment: (NOTE) Calculated using the CKD-EPI Creatinine Equation (2021)    Anion gap 7 5 - 15    Comment: Performed at [facility], [address] [place], [place] [address]  Hemoglobin and hematocrit, blood     Status: Abnormal   Collection Time: 06/13/23 12:46 PM  Result Value Ref Range    Hemoglobin 8.6 (L) 12.0 - 15.0 g/dL   HCT 30.8 (L) 65.7 - 84.6 %    Comment: Performed at [facility], [address] [place], [place] [address]  Hemoglobin and hematocrit, blood     Status: Abnormal   Collection Time: 06/13/23  8:39 PM  Result Value Ref Range   Hemoglobin 8.4 (L) 12.0 - 15.0 g/dL   HCT 28.4 (L) 13.2 - 44.0 %    Comment: Performed at [facility], [address] [place], [place] [address]  CBC     Status: Abnormal   Collection Time: 06/14/23  4:51 AM  Result Value Ref Range   WBC 12.7 (H) 4.0 - 10.5 K/uL   RBC 3.02 (L) 3.87 - 5.11 MIL/uL   Hemoglobin 8.4 (L) 12.0 - 15.0 g/dL   HCT 53.6 (L) 64.4 - 03.4 %   MCV 85.4 80.0 - 100.0 fL   MCH 27.8 26.0 - 34.0 pg   MCHC 32.6 30.0 - 36.0 g/dL   RDW 74.2 (H) 59.5 - 63.8 %   Platelets 243 150 - 400 K/uL   nRBC 0.6 (H) 0.0 - 0.2 %    Comment: Performed at [facility], [address], [place], [place] [address]  Basic metabolic panel with GFR     Status: Abnormal   Collection Time: 06/14/23  4:51 AM  Result Value Ref Range   Sodium 142 135 - 145 mmol/L   Potassium 4.2 3.5 - 5.1 mmol/L   Chloride 108 98 - 111 mmol/L   CO2 26 22 - 32 mmol/L   Glucose, Bld 103 (H) 70 - 99 mg/dL    Comment: Glucose reference range applies only to samples taken after fasting for at least 8 hours.   BUN 24 (H) 8 - 23 mg/dL   Creatinine, Ser 3.29 0.44 - 1.00 mg/dL   Calcium  8.8 (L) 8.9 - 10.3 mg/dL   GFR, Estimated >51 >88 mL/min    Comment: (NOTE) Calculated using the CKD-EPI Creatinine Equation (2021)    Anion gap 8 5 - 15    Comment: Performed at [facility], [address] [place], [place] [address]  Hemoglobin and hematocrit, blood     Status: Abnormal   Collection Time: 06/14/23 10:32 AM  Result Value Ref Range   Hemoglobin 8.5 (L) 12.0 - 15.0 g/dL   HCT 63.0 (L) 16.0 - 10.9 %    Comment: Performed at [facility], [address], [place], [place] [address]  Comprehensive  metabolic panel     Status: Abnormal   Collection Time: 06/16/23 12:27 PM  Result Value Ref Range   Sodium 138 135 -  145 mmol/L   Potassium 3.6 3.5 - 5.1 mmol/L   Chloride 106 98 - 111 mmol/L   CO2 25 22 - 32 mmol/L   Glucose, Bld 137 (H) 70 - 99 mg/dL    Comment: Glucose reference range applies only to samples taken after fasting for at least 8 hours.   BUN 19 8 - 23 mg/dL   Creatinine, Ser 0.98 0.44 - 1.00 mg/dL   Calcium  8.0 (L) 8.9 - 10.3 mg/dL   Total Protein 5.6 (L) 6.5 - 8.1 g/dL   Albumin 2.9 (L) 3.5 - 5.0 g/dL   AST 22 15 - 41 U/L   ALT 16 0 - 44 U/L   Alkaline Phosphatase 74 38 - 126 U/L   Total Bilirubin 0.8 0.0 - 1.2 mg/dL   GFR, Estimated >11 >91 mL/min    Comment: (NOTE) Calculated using the CKD-EPI Creatinine Equation (2021)    Anion gap 7 5 - 15    Comment: Performed at [facility], [address] [place], [place] [address]  CBC     Status: Abnormal   Collection Time: 06/16/23 12:27 PM  Result Value Ref Range   WBC 8.2 4.0 - 10.5 K/uL   RBC 2.92 (L) 3.87 - 5.11 MIL/uL   Hemoglobin 8.2 (L) 12.0 - 15.0 g/dL   HCT 56.2 (L) 13.0 - 86.5 %   MCV 88.0 80.0 - 100.0 fL   MCH 28.1 26.0 - 34.0 pg   MCHC 31.9 30.0 - 36.0 g/dL   RDW 78.4 (H) 69.6 - 29.5 %   Platelets 308 150 - 400 K/uL   nRBC 0.6 (H) 0.0 - 0.2 %    Comment: Performed at [facility], [address] [place], [place] [address]  Type and screen [facility]     Status: None   Collection Time: 06/16/23 12:27 PM  Result Value Ref Range   ABO/RH(D) O POS    Antibody Screen NEG    Sample Expiration      06/19/2023,2359 Performed at [facility], [address] [place], [place] [address]   Hemoglobin and hematocrit, blood     Status: Abnormal   Collection Time: 06/16/23  4:18 PM  Result Value Ref Range   Hemoglobin 8.2 (L) 12.0 - 15.0 g/dL   HCT 02.7 (L) 25.3 - 66.4 %    Comment: Performed at [facility], [address] [place],  [place] [address]      PHQ2/9:    06/21/2023    9:49 AM 04/12/2023    1:20 PM 11/18/2022    7:50 AM 09/04/2022    8:33 AM 06/01/2022   10:48 AM  Depression screen PHQ 2/9  Decreased Interest 0 0 0 0 0  Down, Depressed, Hopeless 0 0 0 0 0  PHQ - 2 Score 0 0 0 0 0  Altered sleeping  0 0  0  Tired, decreased energy  0 0  0  Change in appetite  0 0  0  Feeling bad or failure about yourself   0 0  0  Trouble concentrating  0 0  0  Moving slowly or fidgety/restless  0 0  0  Suicidal thoughts  0 0  0  PHQ-9 Score  0 0  0  Difficult doing work/chores  Not difficult at all Not difficult at all      phq 9 is negative  Fall Risk:    05/21/2023   11:06 AM 11/18/2022    7:50  AM 09/04/2022    8:26 AM 06/01/2022   10:48 AM 01/30/2022   10:49 AM  Fall Risk   Falls in the past year? 0 0 0 0 0  Number falls in past yr: 0 0 0  0  Injury with Fall? 0 0 0  0  Risk for fall due to : No Fall Risks No Fall Risks No Fall Risks No Fall Risks Impaired balance/gait  Follow up Falls prevention discussed;Education provided;Falls evaluation completed Falls prevention discussed;Falls evaluation completed;Education provided Education provided;Falls prevention discussed Falls prevention discussed;Education provided;Falls evaluation completed Falls prevention discussed     Assessment & Plan Hospital Discharge follow up due to Gastrointestinal bleeding Recent GI bleeding with unknown source. EGD and colonoscopy negative for active bleeding. Possible small bowel source. Hemoglobin drop required transfusions. Small hiatal hernia and duodenal diverticulum noted. - Start pantoprazole  BID for one month, then reduce to QD. - Avoid aspirin . - Follow up with GI specialist. - Consider capsule endoscopy if bleeding recurs.  Anemia due to acute blood loss Anemia from GI bleeding. Hemoglobin dropped from 14 to 8.9, requiring transfusions. On iron supplementation. - Continue iron supplementation 325 mg daily with orange  juice. - Recheck hemoglobin at next follow-up.  Fatigue due to anemia Fatigue likely from anemia and deconditioning post-hospitalization. - Encourage rest and gradual activity increase. - Provide time off work until July 25, 2023.  Diverticulosis of duodenum Diverticulosis in duodenum noted during EGD. No active bleeding.  Internal hemorrhoids Internal hemorrhoids present. Not source of recent bleeding but potential future risk with straining. - Take Miralax  daily to prevent straining.  Hiatal hernia Small hiatal hernia identified. No symptoms.

## 2023-06-22 ENCOUNTER — Ambulatory Visit: Payer: Self-pay

## 2023-06-28 NOTE — Addendum Note (Signed)
 Addended by: Larita Pluck on: 06/28/2023 08:47 AM   Modules accepted: Level of Service

## 2023-07-07 DIAGNOSIS — R600 Localized edema: Secondary | ICD-10-CM | POA: Diagnosis not present

## 2023-07-07 DIAGNOSIS — R55 Syncope and collapse: Secondary | ICD-10-CM | POA: Diagnosis not present

## 2023-07-13 ENCOUNTER — Other Ambulatory Visit: Payer: Self-pay | Admitting: Physician Assistant

## 2023-07-13 DIAGNOSIS — I10 Essential (primary) hypertension: Secondary | ICD-10-CM

## 2023-07-14 NOTE — Telephone Encounter (Signed)
 Rx 05/21/23 #90 1RF- too soon Requested Prescriptions  Pending Prescriptions Disp Refills   amLODipine  (NORVASC ) 2.5 MG tablet [Pharmacy Med Name: AMLODIPINE  BESYLATE 2.5MG TABLETS] 90 tablet 1    Sig: TAKE 1 TABLET(2.5 MG) BY MOUTH DAILY     Cardiovascular: Calcium  Channel Blockers 2 Passed - 07/14/2023  2:36 PM      Passed - Last BP in normal range    BP Readings from Last 1 Encounters:  06/21/23 132/70         Passed - Last Heart Rate in normal range    Pulse Readings from Last 1 Encounters:  06/21/23 90         Passed - Valid encounter within last 6 months    Recent Outpatient Visits           3 weeks ago History of GI bleed   [facility] Arleen Lacer, MD   1 month ago Painless rectal bleeding   [facility] Choudrant, Arvis Laura, MD   1 month ago Dyslipidemia associated with type 2 diabetes mellitus [facility])   [facility] Arleen Lacer, MD   3 months ago Small vessel disease, cerebrovascular   [facility] Sowles, Krichna, MD

## 2023-07-19 DIAGNOSIS — H43811 Vitreous degeneration, right eye: Secondary | ICD-10-CM | POA: Diagnosis not present

## 2023-07-19 DIAGNOSIS — E113293 Type 2 diabetes mellitus with mild nonproliferative diabetic retinopathy without macular edema, bilateral: Secondary | ICD-10-CM | POA: Diagnosis not present

## 2023-07-19 DIAGNOSIS — Z961 Presence of intraocular lens: Secondary | ICD-10-CM | POA: Diagnosis not present

## 2023-07-19 DIAGNOSIS — H35412 Lattice degeneration of retina, left eye: Secondary | ICD-10-CM | POA: Diagnosis not present

## 2023-07-19 LAB — HM DIABETES EYE EXAM

## 2023-07-29 ENCOUNTER — Other Ambulatory Visit (INDEPENDENT_AMBULATORY_CARE_PROVIDER_SITE_OTHER): Payer: Self-pay | Admitting: Nurse Practitioner

## 2023-07-29 DIAGNOSIS — I739 Peripheral vascular disease, unspecified: Secondary | ICD-10-CM

## 2023-07-30 ENCOUNTER — Ambulatory Visit (INDEPENDENT_AMBULATORY_CARE_PROVIDER_SITE_OTHER)

## 2023-07-30 ENCOUNTER — Ambulatory Visit (INDEPENDENT_AMBULATORY_CARE_PROVIDER_SITE_OTHER): Admitting: Nurse Practitioner

## 2023-07-30 ENCOUNTER — Encounter (INDEPENDENT_AMBULATORY_CARE_PROVIDER_SITE_OTHER): Payer: Self-pay | Admitting: Nurse Practitioner

## 2023-07-30 VITALS — BP 161/81 | HR 73 | Resp 16 | Ht 64.0 in | Wt 131.6 lb

## 2023-07-30 DIAGNOSIS — I739 Peripheral vascular disease, unspecified: Secondary | ICD-10-CM

## 2023-07-30 DIAGNOSIS — I10 Essential (primary) hypertension: Secondary | ICD-10-CM

## 2023-07-30 DIAGNOSIS — E119 Type 2 diabetes mellitus without complications: Secondary | ICD-10-CM | POA: Diagnosis not present

## 2023-08-02 ENCOUNTER — Encounter (INDEPENDENT_AMBULATORY_CARE_PROVIDER_SITE_OTHER): Payer: Self-pay | Admitting: Nurse Practitioner

## 2023-08-02 NOTE — Progress Notes (Signed)
 Subjective:    Patient ID: Catherine Burgess, female    DOB: [birth year], 88 y.o.   MRN: [id number] Chief Complaint  Patient presents with   Follow-up    Last seen 11/25/20 ABI + consult ref.Sowles    Today peripheral arterial disease.  She has a previous history of hypertension and diabetes as well as a previous smoker.  She notes that since it has been several years since she seen us  her primary felt it was best to follow-up with us .  She denies any claudication-like symptoms.  She was recently discharged from hospital so she continues to be a little bit weaker.  She denies any rest pain or ulcerations.  Today the patient has an ABI of 1.06 on the right and 1.01 on the left.  She does have slightly diminished TBI's of 0.64 on the right left.  She has multiphasic waveforms of the right and triphasic waveform on the left lower extremity.  The patient notes that she is still fairly active and works about 3 days/week.  She was recently discharged from the hospital after an extended hospitalization does endorse some weakness but she notes that she is getting better daily.    Review of Systems  All other systems reviewed and are negative.      Objective:   Physical Exam Vitals reviewed.  HENT:     Head: Normocephalic.   Cardiovascular:     Rate and Rhythm: Normal rate.     Pulses:          Dorsalis pedis pulses are detected w/ Doppler on the right side and detected w/ Doppler on the left side.       Posterior tibial pulses are detected w/ Doppler on the right side and detected w/ Doppler on the left side.  Pulmonary:     Effort: Pulmonary effort is normal.   Skin:    General: Skin is warm and dry.   Neurological:     Mental Status: She is alert and oriented to person, place, and time.   Psychiatric:        Mood and Affect: Mood normal.        Behavior: Behavior normal.        Thought Content: Thought content normal.        Judgment: Judgment normal.     BP (!) 161/81 (BP  Location: Left Arm, Patient Position: Sitting, Cuff Size: Normal)   Pulse 73   Resp 16   Ht 5' 4 (1.626 m)   Wt 131 lb 9.6 oz (59.7 kg)   BMI 22.59 kg/m   Past Medical History:  Diagnosis Date   Degenerative joint disease    GERD (gastroesophageal reflux disease)    GI bleed 06/10/2023   Hyperlipidemia    Hypertension    Malignant essential hypertension 07/23/2016   Osteoarthrosis    Reflux esophagitis     Social History   Socioeconomic History   Marital status: Divorced    Spouse name: Not on file   Number of children: 1   Years of education: Not on file   Highest education level: 12th grade  Occupational History   Occupation: Retired  Tobacco Use   Smoking status: Former    Current packs/day: 0.00    Average packs/day: 0.3 packs/day for 1 year (0.3 ttl pk-yrs)    Types: Cigarettes    Start date: 51    Quit date: 1966    Years since quitting: 59.5   Smokeless tobacco: Never  Tobacco comments:    smoking cessation materials not required  Vaping Use   Vaping status: Never Used  Substance and Sexual Activity   Alcohol use: No    Alcohol/week: 0.0 standard drinks of alcohol   Drug use: No   Sexual activity: Not Currently  Other Topics Concern   Not on file  Social History Narrative   Pt lives alone.    Social Drivers of Corporate investment banker Strain: Low Risk  (03/19/2023)   Received from [facility]   Overall Financial Resource Strain (CARDIA)    Difficulty of Paying Living Expenses: Not hard at all  Food Insecurity: No Food Insecurity (06/09/2023)   Hunger Vital Sign    Worried About Running Out of Food in the Last Year: Never true    Ran Out of Food in the Last Year: Never true  Transportation Needs: No Transportation Needs (06/09/2023)   PRAPARE - Administrator, Civil Service (Medical): No    Lack of Transportation (Non-Medical): No  Physical Activity: Inactive (09/04/2022)   Exercise Vital Sign    Days of  Exercise per Week: 0 days    Minutes of Exercise per Session: 0 min  Stress: No Stress Concern Present (09/04/2022)   [employer] of Occupational Health - Occupational Stress Questionnaire    Feeling of Stress : Not at all  Social Connections: Moderately Integrated (06/09/2023)   Social Connection and Isolation Panel    Frequency of Communication with Friends and Family: More than three times a week    Frequency of Social Gatherings with Friends and Family: More than three times a week    Attends Religious Services: More than 4 times per year    Active Member of [employer] or Organizations: Yes    Attends Banker Meetings: Never    Marital Status: Divorced  Catering manager Violence: Not At Risk (06/09/2023)   Humiliation, Afraid, Rape, and Kick questionnaire    Fear of Current or Ex-Partner: No    Emotionally Abused: No    Physically Abused: No    Sexually Abused: No    Past Surgical History:  Procedure Laterality Date   CATARACT EXTRACTION, BILATERAL     COLONOSCOPY N/A 06/10/2023   Procedure: COLONOSCOPY;  Surgeon: Jinny Carmine, MD;  Location: [facility] ENDOSCOPY;  Service: Endoscopy;  Laterality: N/A;   COLONOSCOPY WITH PROPOFOL  N/A 06/20/2020   Procedure: COLONOSCOPY WITH PROPOFOL ;  Surgeon: Unk Corinn Skiff, MD;  Location: [facility] ENDOSCOPY;  Service: Gastroenterology;  Laterality: N/A;   ESOPHAGOGASTRODUODENOSCOPY N/A 06/10/2023   Procedure: EGD (ESOPHAGOGASTRODUODENOSCOPY);  Surgeon: Jinny Carmine, MD;  Location: [facility] ENDOSCOPY;  Service: Endoscopy;  Laterality: N/A;   EYE SURGERY     HIP PINNING Left 1956   JOINT REPLACEMENT     REPLACEMENT TOTAL KNEE Left 11/12/2009   TOTAL KNEE REVISION Left 12/07/2016   Procedure: TOTAL KNEE REVISION;  Surgeon: Mardee Lynwood SQUIBB, MD;  Location: [facility] ORS;  Service: Orthopedics;  Laterality: Left;    Family History  Problem Relation Age of Onset   Emphysema Father        Smoker   CAD Mother    Stroke Mother    Diabetes Brother     Seizures Brother    Cancer Brother        unknown   Diabetes Brother     Allergies  Allergen Reactions   Lovastatin Other (See Comments)    chest   Pravastatin     Other reaction(s): Muscle  Pain chest   Rosuvastatin     Other reaction(s): Muscle Pain chest Other reaction(s): Muscle Pain chest   Statins     Other reaction(s): Muscle Pain chest       Latest Ref Rng & Units 06/21/2023   10:49 AM 06/16/2023    4:18 PM 06/16/2023   12:27 PM  CBC  WBC 3.8 - 10.8 Thousand/uL 8.0   8.2   Hemoglobin 11.7 - 15.5 g/dL 8.8  8.2  8.2   Hematocrit 35.0 - 45.0 % 28.7  26.2  25.7   Platelets 140 - 400 Thousand/uL 473   308       CMP     Component Value Date/Time   NA 141 06/21/2023 1049   NA 144 05/28/2015 1553   NA 140 01/22/2014 1018   K 4.1 06/21/2023 1049   K 3.8 01/22/2014 1018   CL 108 06/21/2023 1049   CL 100 01/22/2014 1018   CO2 28 06/21/2023 1049   CO2 33 (H) 01/22/2014 1018   GLUCOSE 89 06/21/2023 1049   GLUCOSE 106 (H) 01/22/2014 1018   BUN 18 06/21/2023 1049   BUN 21 05/28/2015 1553   BUN 22 (H) 01/22/2014 1018   CREATININE 0.71 06/21/2023 1049   CALCIUM  8.6 06/21/2023 1049   CALCIUM  8.9 01/22/2014 1018   PROT 5.7 (L) 06/21/2023 1049   PROT 7.2 05/28/2015 1553   PROT 7.3 01/22/2014 1018   ALBUMIN 2.9 (L) 06/16/2023 1227   ALBUMIN 4.5 05/28/2015 1553   ALBUMIN 3.5 01/22/2014 1018   AST 18 06/21/2023 1049   AST 17 01/22/2014 1018   ALT 15 06/21/2023 1049   ALT 24 01/22/2014 1018   ALKPHOS 74 06/16/2023 1227   ALKPHOS 157 (H) 01/22/2014 1018   BILITOT 0.4 06/21/2023 1049   BILITOT 0.6 05/28/2015 1553   BILITOT 1.0 01/22/2014 1018   EGFR 83 06/21/2023 1049   GFRNONAA >60 06/16/2023 1227   GFRNONAA 82 04/05/2020 1118     No results found.     Assessment & Plan:   1. PAD (peripheral artery disease) (HCC) (Primary) Today noninvasive studies indicate that the patient does have evidence of peripheral arterial disease however it is relatively mild and  small vessel related.  Based on this and no significant symptoms, we will have the patient return on an annual basis.  She is advised that if she begins to have worsening claudication, rest pain or open wounds she should reach out to us  sooner.  2. Essential hypertension Continue antihypertensive medications as already ordered, these medications have been reviewed and there are no changes at this time.  3. Diabetes mellitus type 2, diet-controlled (HCC) Continue hypoglycemic medications as already ordered, these medications have been reviewed and there are no changes at this time.  Hgb A1C to be monitored as already arranged by primary service   Current Outpatient Medications on File Prior to Visit  Medication Sig Dispense Refill   acetaminophen  (TYLENOL ) 500 MG tablet Take 1 tablet (500 mg total) by mouth 2 (two) times daily. (Patient taking differently: Take 500 mg by mouth every 6 (six) hours as needed for mild pain (pain score 1-3).) 180 tablet 0   amLODipine  (NORVASC ) 2.5 MG tablet Take 1 tablet (2.5 mg total) by mouth daily. 90 tablet 1   atorvastatin  (LIPITOR) 40 MG tablet Take 1 tablet (40 mg total) by mouth daily. TAKE 1 TABLET(40 MG) BY MOUTH DAILY 90 tablet 1   Calcium -Vitamin D  600-200 MG-UNIT tablet Take 1 tablet by mouth  daily.      ferrous sulfate  325 (65 FE) MG EC tablet Take 1 tablet (325 mg total) by mouth daily with breakfast. 90 tablet 0   fluticasone  (FLONASE ) 50 MCG/ACT nasal spray Place into the nose.     gabapentin  (NEURONTIN ) 300 MG capsule Take 1 capsule (300 mg total) by mouth every morning AND 2 capsules (600 mg total) at bedtime. 270 capsule 1   losartan  (COZAAR ) 50 MG tablet Take 1 tablet (50 mg total) by mouth daily. 90 tablet 1   pantoprazole  (PROTONIX ) 40 MG tablet Take 1 tablet (40 mg total) by mouth 2 (two) times daily before a meal. 60 tablet 0   polyethylene glycol powder (GLYCOLAX /MIRALAX ) 17 GM/SCOOP powder Take 17 g by mouth 2 (two) times daily as needed.  3350 g 1   tiZANidine  (ZANAFLEX ) 2 MG tablet Take 1 tablet (2 mg total) by mouth at bedtime. PRN 90 tablet 1   No current facility-administered medications on file prior to visit.    There are no Patient Instructions on file for this visit. No follow-ups on file.   Kahmari Koller E Drue Harr, NP

## 2023-08-04 LAB — VAS US ABI WITH/WO TBI
Left ABI: 1.01
Right ABI: 1.06

## 2023-09-09 ENCOUNTER — Ambulatory Visit: Payer: Medicare Other

## 2023-09-09 DIAGNOSIS — Z0000 Encounter for general adult medical examination without abnormal findings: Secondary | ICD-10-CM | POA: Diagnosis not present

## 2023-09-09 NOTE — Progress Notes (Signed)
 Subjective:   Catherine Burgess is a 88 y.o. who presents for a Medicare Wellness preventive visit.  As a reminder, Annual Wellness Visits don't include a physical exam, and some assessments may be limited, especially if this visit is performed virtually. We may recommend an in-person follow-up visit with your provider if needed.  Visit Complete: Virtual I connected with  Catherine Burgess on 09/09/23 by a audio enabled telemedicine application and verified that I am speaking with the correct person using two identifiers.  Patient Location: Home  Provider Location: Home Office  I discussed the limitations of evaluation and management by telemedicine. The patient expressed understanding and agreed to proceed.  Vital Signs: Because this visit was a virtual/telehealth visit, some criteria may be missing or patient reported. Any vitals not documented were not able to be obtained and vitals that have been documented are patient reported.  VideoDeclined- This patient declined Librarian, academic. Therefore the visit was completed with audio only.  Persons Participating in Visit: Patient.  AWV Questionnaire: No: Patient Medicare AWV questionnaire was not completed prior to this visit.  Cardiac Risk Factors include: advanced age (>49men, >67 women);diabetes mellitus;hypertension;dyslipidemia     Objective:    There were no vitals filed for this visit. There is no height or weight on file to calculate BMI.     09/09/2023    9:01 AM 06/16/2023   12:24 PM 06/10/2023   12:15 PM 06/09/2023    5:09 PM 06/09/2023   11:06 AM 06/08/2023    4:33 PM 04/05/2023    3:42 PM  Advanced Directives  Does Patient Have a Medical Advance Directive? No Yes Yes Yes Yes Yes No;Yes  Type of Special educational needs teacher of [place];Living will Healthcare Power of [place];Living will Living will   Living will  Does patient want to make changes to medical advance directive?    No  - Patient declined   No - Patient declined  Copy of Healthcare Power of Attorney in Chart?   No - copy requested      Would patient like information on creating a medical advance directive? No - Patient declined      No - Patient declined    Current Medications (verified) Outpatient Encounter Medications as of 09/09/2023  Medication Sig   acetaminophen  (TYLENOL ) 500 MG tablet Take 1 tablet (500 mg total) by mouth 2 (two) times daily.   amLODipine  (NORVASC ) 2.5 MG tablet Take 1 tablet (2.5 mg total) by mouth daily.   atorvastatin  (LIPITOR) 40 MG tablet Take 1 tablet (40 mg total) by mouth daily. TAKE 1 TABLET(40 MG) BY MOUTH DAILY   Calcium -Vitamin D  600-200 MG-UNIT tablet Take 1 tablet by mouth daily.    ferrous sulfate  325 (65 FE) MG EC tablet Take 1 tablet (325 mg total) by mouth daily with breakfast.   fluticasone  (FLONASE ) 50 MCG/ACT nasal spray Place into the nose.   gabapentin  (NEURONTIN ) 300 MG capsule Take 1 capsule (300 mg total) by mouth every morning AND 2 capsules (600 mg total) at bedtime.   losartan  (COZAAR ) 50 MG tablet Take 1 tablet (50 mg total) by mouth daily.   pantoprazole  (PROTONIX ) 40 MG tablet Take 1 tablet (40 mg total) by mouth 2 (two) times daily before a meal.   polyethylene glycol powder (GLYCOLAX /MIRALAX ) 17 GM/SCOOP powder Take 17 g by mouth 2 (two) times daily as needed.   tiZANidine  (ZANAFLEX ) 2 MG tablet Take 1 tablet (2 mg total) by mouth at  bedtime. PRN   No facility-administered encounter medications on file as of 09/09/2023.    Allergies (verified) Lovastatin, Pravastatin, Rosuvastatin, and Statins   History: Past Medical History:  Diagnosis Date   Degenerative joint disease    GERD (gastroesophageal reflux disease)    GI bleed 06/10/2023   Hyperlipidemia    Hypertension    Malignant essential hypertension 07/23/2016   Osteoarthrosis    Reflux esophagitis    Past Surgical History:  Procedure Laterality Date   CATARACT EXTRACTION, BILATERAL      COLONOSCOPY N/A 06/10/2023   Procedure: COLONOSCOPY;  Surgeon: Jinny Carmine, MD;  Location: [facility] ENDOSCOPY;  Service: Endoscopy;  Laterality: N/A;   COLONOSCOPY WITH PROPOFOL  N/A 06/20/2020   Procedure: COLONOSCOPY WITH PROPOFOL ;  Surgeon: Unk Corinn Skiff, MD;  Location[facility] ENDOSCOPY;  Service: Gastroenterology;  Laterality: N/A;   ESOPHAGOGASTRODUODENOSCOPY N/A 06/10/2023   Procedure: EGD (ESOPHAGOGASTRODUODENOSCOPY);  Surgeon: Jinny Carmine, MD;  Location[facility] ENDOSCOPY;  Service: Endoscopy;  Laterality: N/A;   EYE SURGERY     HIP PINNING Left 1956   JOINT REPLACEMENT     REPLACEMENT TOTAL KNEE Left 11/12/2009   TOTAL KNEE REVISION Left 12/07/2016   Procedure: TOTAL KNEE REVISION;  Surgeon: Mardee Lynwood SQUIBB, MD;  Location: [facility] ORS;  Service: Orthopedics;  Laterality: Left;   Family History  Problem Relation Age of Onset   Emphysema Father        Smoker   CAD Mother    Stroke Mother    Diabetes Brother    Seizures Brother    Cancer Brother        unknown   Diabetes Brother    Social History   Socioeconomic History   Marital status: Divorced    Spouse name: Not on file   Number of children: 1   Years of education: Not on file   Highest education level: 12th grade  Occupational History   Occupation: Retired  Tobacco Use   Smoking status: Former    Current packs/day: 0.00    Average packs/day: 0.3 packs/day for 1 year (0.3 ttl pk-yrs)    Types: Cigarettes    Start date: 45    Quit date: 1966    Years since quitting: 59.6   Smokeless tobacco: Never   Tobacco comments:    smoking cessation materials not required  Vaping Use   Vaping status: Never Used  Substance and Sexual Activity   Alcohol use: No    Alcohol/week: 0.0 standard drinks of alcohol   Drug use: No   Sexual activity: Not Currently  Other Topics Concern   Not on file  Social History Narrative   Pt lives alone.    Social Drivers of Corporate investment banker Strain: Low Risk  (09/09/2023)    Overall Financial Resource Strain (CARDIA)    Difficulty of Paying Living Expenses: Not hard at all  Food Insecurity: No Food Insecurity (09/09/2023)   Hunger Vital Sign    Worried About Running Out of Food in the Last Year: Never true    Ran Out of Food in the Last Year: Never true  Transportation Needs: No Transportation Needs (09/09/2023)   PRAPARE - Administrator, Civil Service (Medical): No    Lack of Transportation (Non-Medical): No  Physical Activity: Insufficiently Active (09/09/2023)   Exercise Vital Sign    Days of Exercise per Week: 3 days    Minutes of Exercise per Session: 30 min  Stress: No Stress Concern Present (09/09/2023)   [place]  [employer] of Occupational Health - Occupational Stress Questionnaire    Feeling of Stress: Not at all  Social Connections: Moderately Integrated (09/09/2023)   Social Connection and Isolation Panel    Frequency of Communication with Friends and Family: More than three times a week    Frequency of Social Gatherings with Friends and Family: More than three times a week    Attends Religious Services: More than 4 times per year    Active Member of [employer] or Organizations: Yes    Attends Engineer, structural: More than 4 times per year    Marital Status: Divorced    Tobacco Counseling Counseling given: Not Answered Tobacco comments: smoking cessation materials not required    Clinical Intake:  Pre-visit preparation completed: Yes  Pain : No/denies pain     BMI - recorded: 22.5 Nutritional Status: BMI of 19-24  Normal Nutritional Risks: None Diabetes: No  Lab Results  Component Value Date   HGBA1C 6.3 (A) 05/21/2023   HGBA1C 6.2 (H) 11/18/2022   HGBA1C 6.0 (A) 06/01/2022     How often do you need to have someone help you when you read instructions, pamphlets, or other written materials from your doctor or pharmacy?: 1 - Never  Interpreter Needed?: No  Information entered by :: JHONNIE DAS,  LPN   Activities of Daily Living    09/09/2023    9:02 AM 06/09/2023    5:09 PM  In your present state of health, do you have any difficulty performing the following activities:  Hearing? 0 0  Vision? 0 0  Difficulty concentrating or making decisions? 0 0  Walking or climbing stairs? 0   Dressing or bathing? 0   Doing errands, shopping? 0 0  Preparing Food and eating ? N   Using the Toilet? N   In the past six months, have you accidently leaked urine? N   Do you have problems with loss of bowel control? N   Managing your Medications? N   Managing your Finances? N   Housekeeping or managing your Housekeeping? N     Patient Care Team: Sowles, Krichna, MD as PCP - General (Family Medicine) Mittie Gaskin, MD as Consulting Physician (Ophthalmology) Florencio Cara BIRCH, MD as Consulting Physician (Cardiology) Mardee, Lynwood SQUIBB, MD as Consulting Physician (Orthopedic Surgery) Connell Davies, MD (Inactive) as Consulting Physician (Obstetrics and Gynecology) Kathrene Almarie Bake, NP as Nurse Practitioner (Neurology) [facility] (Acute Care)  I have updated your Care Teams any recent Medical Services you may have received from other providers in the past year.     Assessment:   This is a routine wellness examination for Catherine Burgess.  Hearing/Vision screen Hearing Screening - Comments:: NO AIDS Vision Screening - Comments:: GLASSES ALL DAY- DR.BRASINGTON   Goals Addressed             This Visit's Progress    DIET - EAT MORE FRUITS AND VEGETABLES         Depression Screen     09/09/2023    8:58 AM 06/21/2023    9:49 AM 04/12/2023    1:20 PM 11/18/2022    7:50 AM 09/04/2022    8:33 AM 06/01/2022   10:48 AM 01/30/2022   10:50 AM  PHQ 2/9 Scores  PHQ - 2 Score 0 0 0 0 0 0 0  PHQ- 9 Score 0  0 0  0 2    Fall Risk     09/09/2023    9:02 AM 05/21/2023  11:06 AM 11/18/2022    7:50 AM 09/04/2022    8:26 AM 06/01/2022   10:48 AM  Fall Risk   Falls  in the past year? 0 0 0 0 0  Number falls in past yr: 0 0 0 0   Injury with Fall? 0 0 0 0   Risk for fall due to : No Fall Risks No Fall Risks No Fall Risks No Fall Risks No Fall Risks  Follow up Falls evaluation completed;Falls prevention discussed Falls prevention discussed;Education provided;Falls evaluation completed Falls prevention discussed;Falls evaluation completed;Education provided Education provided;Falls prevention discussed Falls prevention discussed;Education provided;Falls evaluation completed    MEDICARE RISK AT HOME:  Medicare Risk at Home Any stairs in or around the home?: No If so, are there any without handrails?: No Home free of loose throw rugs in walkways, pet beds, electrical cords, etc?: Yes Adequate lighting in your home to reduce risk of falls?: Yes Life alert?: No Use of a cane, walker or w/c?: Yes (USES CANE WHEN OUT OF HOUSE (NOT AT WORK)) Grab bars in the bathroom?: Yes Shower chair or bench in shower?: No Elevated toilet seat or a handicapped toilet?: Yes  TIMED UP AND GO:  Was the test performed?  No  Cognitive Function: 6CIT completed        09/09/2023    9:05 AM 09/04/2022    8:41 AM 10/17/2019   10:57 AM 10/11/2018   10:55 AM 10/07/2017   12:54 PM  6CIT Screen  What Year? 0 points 0 points 0 points 0 points 0 points  What month? 0 points 0 points 0 points 0 points 3 points  What time? 0 points 0 points 0 points 0 points 0 points  Count back from 20 0 points 0 points 0 points 0 points 0 points  Months in reverse 0 points 4 points 0 points 0 points 0 points  Repeat phrase 4 points 6 points 6 points 4 points 8 points  Total Score 4 points 10 points 6 points 4 points 11 points    Immunizations Immunization History  Administered Date(s) Administered   Fluad Quad(high Dose 65+) 09/30/2018, 10/17/2019, 11/04/2020, 01/30/2022   Fluad Trivalent(High Dose 65+) 11/18/2022   Influenza, High Dose Seasonal PF 10/13/2016, 01/20/2018    Influenza-Unspecified 10/31/2014, 12/05/2015   PFIZER(Purple Top)SARS-COV-2 Vaccination 03/03/2019, 03/25/2019, 12/18/2019   PPD Test 08/02/2019   Pfizer Covid-19 Vaccine Bivalent Booster 78yrs & up 12/17/2020   Pfizer(Comirnaty)Fall Seasonal Vaccine 12 years and older 12/08/2022   Pneumococcal Conjugate-13 04/26/2013   Pneumococcal Polysaccharide-23 10/13/2016   Tdap 08/23/2012   Zoster Recombinant(Shingrix) 02/16/2022, 04/17/2022    Screening Tests Health Maintenance  Topic Date Due   Diabetic kidney evaluation - Urine ACR  06/01/2023   FOOT EXAM  06/01/2023   COVID-19 Vaccine (6 - Pfizer risk 2024-25 season) 06/08/2023   DTaP/Tdap/Td (2 - Td or Tdap) 04/11/2024 (Originally 08/24/2022)   INFLUENZA VACCINE  09/10/2023   HEMOGLOBIN A1C  11/20/2023   Diabetic kidney evaluation - eGFR measurement  06/20/2024   OPHTHALMOLOGY EXAM  07/18/2024   Medicare Annual Wellness (AWV)  09/08/2024   Pneumococcal Vaccine: 50+ Years  Completed   DEXA SCAN  Completed   Zoster Vaccines- Shingrix  Completed   Hepatitis B Vaccines  Aged Out   HPV VACCINES  Aged Out   Meningococcal B Vaccine  Aged Out    Health Maintenance  Health Maintenance Due  Topic Date Due   Diabetic kidney evaluation - Urine ACR  06/01/2023   FOOT  EXAM  06/01/2023   COVID-19 Vaccine (6 - Pfizer risk 2024-25 season) 06/08/2023   Health Maintenance Items Addressed: NEEDS PNA, TDAP; UP TO DATE ON COVID, SHINGRIX; AGED OUT OF MAMMOGRAM, BDS, & COLONOSCOPY  Additional Screening:  Vision Screening: Recommended annual ophthalmology exams for early detection of glaucoma and other disorders of the eye. Would you like a referral to an eye doctor? No    Dental Screening: Recommended annual dental exams for proper oral hygiene  Community Resource Referral / Chronic Care Management: CRR required this visit?  No   CCM required this visit?  No   Plan:    I have personally reviewed and noted the following in the patient's  chart:   Medical and social history Use of alcohol, tobacco or illicit drugs  Current medications and supplements including opioid prescriptions. Patient is not currently taking opioid prescriptions. Functional ability and status Nutritional status Physical activity Advanced directives List of other physicians Hospitalizations, surgeries, and ER visits in previous 12 months Vitals Screenings to include cognitive, depression, and falls Referrals and appointments  In addition, I have reviewed and discussed with patient certain preventive protocols, quality metrics, and best practice recommendations. A written personalized care plan for preventive services as well as general preventive health recommendations were provided to patient.   Jhonnie GORMAN Das, LPN   [id number]/[id number]   After Visit Summary: (MyChart) Due to this being a telephonic visit, the after visit summary with patients personalized plan was offered to patient via MyChart   Notes: Nothing significant to report at this time.

## 2023-09-09 NOTE — Patient Instructions (Signed)
 Catherine Burgess , Thank you for taking time out of your busy schedule to complete your Annual Wellness Visit with me. I enjoyed our conversation and look forward to speaking with you again next year. I, as well as your care team,  appreciate your ongoing commitment to your health goals. Please review the following plan we discussed and let me know if I can assist you in the future.   Follow up Visits: Next Medicare AWV with our clinical staff:   09/14/24 @ 8:50 AM BY PHONE Have you seen your provider in the last 6 months (3 months if uncontrolled diabetes)? Yes   Clinician Recommendations:  Aim for 30 minutes of exercise or brisk walking, 6-8 glasses of water, and 5 servings of fruits and vegetables each day. TAKE CARE!      This is a list of the screening recommended for you and due dates:  Health Maintenance  Topic Date Due   Yearly kidney health urinalysis for diabetes  06/01/2023   Complete foot exam   06/01/2023   COVID-19 Vaccine (6 - Pfizer risk 2024-25 season) 06/08/2023   DTaP/Tdap/Td vaccine (2 - Td or Tdap) 04/11/2024*   Flu Shot  09/10/2023   Hemoglobin A1C  11/20/2023   Yearly kidney function blood test for diabetes  06/20/2024   Eye exam for diabetics  07/18/2024   Medicare Annual Wellness Visit  09/08/2024   Pneumococcal Vaccine for age over 1  Completed   DEXA scan (bone density measurement)  Completed   Zoster (Shingles) Vaccine  Completed   Hepatitis B Vaccine  Aged Out   HPV Vaccine  Aged Out   Meningitis B Vaccine  Aged Out  *Topic was postponed. The date shown is not the original due date.    Advanced directives: (ACP Link)Information on Advanced Care Planning can be found at [place]  Secretary of [facility] Advance Health Care Directives Advance Health Care Directives. [email or web address]  Advance Care Planning is important because it:  [x]  Makes sure you receive the medical care that is consistent with your values, goals, and preferences  [x]  It provides guidance to  your family and loved ones and reduces their decisional burden about whether or not they are making the right decisions based on your wishes.  Follow the link provided in your after visit summary or read over the paperwork we have mailed to you to help you started getting your Advance Directives in place. If you need assistance in completing these, please reach out to us  so that we can help you!

## 2023-09-30 DIAGNOSIS — Z03818 Encounter for observation for suspected exposure to other biological agents ruled out: Secondary | ICD-10-CM | POA: Diagnosis not present

## 2023-09-30 DIAGNOSIS — R509 Fever, unspecified: Secondary | ICD-10-CM | POA: Diagnosis not present

## 2023-09-30 DIAGNOSIS — R051 Acute cough: Secondary | ICD-10-CM | POA: Diagnosis not present

## 2023-09-30 DIAGNOSIS — R350 Frequency of micturition: Secondary | ICD-10-CM | POA: Diagnosis not present

## 2023-09-30 DIAGNOSIS — J0140 Acute pansinusitis, unspecified: Secondary | ICD-10-CM | POA: Diagnosis not present

## 2023-10-05 ENCOUNTER — Other Ambulatory Visit: Payer: Self-pay | Admitting: Family Medicine

## 2023-10-05 DIAGNOSIS — K219 Gastro-esophageal reflux disease without esophagitis: Secondary | ICD-10-CM

## 2023-10-05 NOTE — Telephone Encounter (Unsigned)
 Copied from CRM #[id number]. Topic: Clinical - Medication Refill >> Oct 05, 2023  8:17 AM Ivette P wrote: Medication:   -pantoprazole  (PROTONIX ) 40 MG tablet  -ferrous sulfate  325 (65 FE) MG EC tablet   Has the patient contacted their pharmacy? Yes (Agent: If no, request that the patient contact the pharmacy for the refill. If patient does not wish to contact the pharmacy document the reason why and proceed with request.) (Agent: If yes, when and what did the pharmacy advise?)  This is the patient's preferred pharmacy:  [id number] [place], [place] - [address] AT [facility] OF [address] [place] [address] [place] [address] Phone: [phone] Fax: [phone]  Is this the correct pharmacy for this prescription? Yes If no, delete pharmacy and type the correct one.   Has the prescription been filled recently? No  Is the patient out of the medication? Yes  Has the patient been seen for an appointment in the last year OR does the patient have an upcoming appointment? Yes  Can we respond through MyChart? No  Agent: Please be advised that Rx refills may take up to 3 business days. We ask that you follow-up with your pharmacy.

## 2023-10-06 MED ORDER — PANTOPRAZOLE SODIUM 40 MG PO TBEC: 40.0000 mg | DELAYED_RELEASE_TABLET | Freq: Two times a day (BID) | ORAL | 0 refills | Status: DC

## 2023-10-06 MED ORDER — FERROUS SULFATE 325 (65 FE) MG PO TBEC: 325.0000 mg | DELAYED_RELEASE_TABLET | Freq: Every day | ORAL | 0 refills | Status: AC

## 2023-10-06 NOTE — Telephone Encounter (Signed)
 Requested Prescriptions  Pending Prescriptions Disp Refills   ferrous sulfate  325 (65 FE) MG EC tablet 90 tablet 0    Sig: Take 1 tablet (325 mg total) by mouth daily with breakfast.     Endocrinology:  Minerals - Iron Supplementation Failed - 10/06/2023 12:54 PM      Failed - HGB in normal range and within 360 days    Hemoglobin  Date Value Ref Range Status  06/21/2023 8.8 (L) 11.7 - 15.5 g/dL Final  95/81/7982 86.6 11.1 - 15.9 g/dL Final         Failed - HCT in normal range and within 360 days    HCT  Date Value Ref Range Status  06/21/2023 28.7 (L) 35.0 - 45.0 % Final   Hematocrit  Date Value Ref Range Status  05/28/2015 40.1 34.0 - 46.6 % Final         Failed - RBC in normal range and within 360 days    RBC  Date Value Ref Range Status  06/21/2023 3.26 (L) 3.80 - 5.10 Million/uL Final         Failed - Fe (serum) in normal range and within 360 days    Iron  Date Value Ref Range Status  07/29/2016 29 (L) 45 - 160 ug/dL Final   %SAT  Date Value Ref Range Status  07/29/2016 7 (L) 11 - 50 % Final         Failed - Ferritin in normal range and within 360 days    Ferritin  Date Value Ref Range Status  07/29/2016 21 20 - 288 ng/mL Final         Passed - Valid encounter within last 12 months    Recent Outpatient Visits           3 months ago History of GI bleed   [facility] [place] Mire, MD   4 months ago Painless rectal bleeding   [facility] [place] M[place], MD   4 months ago Dyslipidemia associated with type 2 diabetes mellitus [facility])   [place] [facility] [place] Mire, MD   5 months ago Small vessel disease, cerebrovascular   [facility] Snyder, Krichna, MD               pantoprazole  (PROTONIX ) 40 MG tablet 60 tablet 0    Sig: Take 1 tablet (40 mg total) by mouth 2 (two) times daily before a meal.     Gastroenterology: Proton Pump Inhibitors Passed  - 10/06/2023 12:54 PM      Passed - Valid encounter within last 12 months    Recent Outpatient Visits           3 months ago History of GI bleed   [facility] [place] Mire, MD   4 months ago Painless rectal bleeding   [facility] [place] [employer], Mire, MD   4 months ago Dyslipidemia associated with type 2 diabetes mellitus [facility])   [place] [facility] [place] Mire, MD   5 months ago Small vessel disease, cerebrovascular   [facility] Sowles, Krichna, MD

## 2023-10-18 DIAGNOSIS — R262 Difficulty in walking, not elsewhere classified: Secondary | ICD-10-CM | POA: Diagnosis not present

## 2023-10-18 DIAGNOSIS — M545 Low back pain, unspecified: Secondary | ICD-10-CM | POA: Diagnosis not present

## 2023-10-25 DIAGNOSIS — M25512 Pain in left shoulder: Secondary | ICD-10-CM | POA: Diagnosis not present

## 2023-10-25 DIAGNOSIS — M25511 Pain in right shoulder: Secondary | ICD-10-CM | POA: Diagnosis not present

## 2023-10-25 DIAGNOSIS — M7582 Other shoulder lesions, left shoulder: Secondary | ICD-10-CM | POA: Diagnosis not present

## 2023-10-25 DIAGNOSIS — M545 Low back pain, unspecified: Secondary | ICD-10-CM | POA: Diagnosis not present

## 2023-10-25 DIAGNOSIS — M19012 Primary osteoarthritis, left shoulder: Secondary | ICD-10-CM | POA: Diagnosis not present

## 2023-10-25 DIAGNOSIS — M19011 Primary osteoarthritis, right shoulder: Secondary | ICD-10-CM | POA: Diagnosis not present

## 2023-10-25 DIAGNOSIS — M7581 Other shoulder lesions, right shoulder: Secondary | ICD-10-CM | POA: Diagnosis not present

## 2023-10-25 DIAGNOSIS — G8929 Other chronic pain: Secondary | ICD-10-CM | POA: Diagnosis not present

## 2023-10-25 DIAGNOSIS — R262 Difficulty in walking, not elsewhere classified: Secondary | ICD-10-CM | POA: Diagnosis not present

## 2023-10-29 DIAGNOSIS — M545 Low back pain, unspecified: Secondary | ICD-10-CM | POA: Diagnosis not present

## 2023-10-29 DIAGNOSIS — R262 Difficulty in walking, not elsewhere classified: Secondary | ICD-10-CM | POA: Diagnosis not present

## 2023-11-01 DIAGNOSIS — M545 Low back pain, unspecified: Secondary | ICD-10-CM | POA: Diagnosis not present

## 2023-11-01 DIAGNOSIS — R262 Difficulty in walking, not elsewhere classified: Secondary | ICD-10-CM | POA: Diagnosis not present

## 2023-11-05 DIAGNOSIS — R262 Difficulty in walking, not elsewhere classified: Secondary | ICD-10-CM | POA: Diagnosis not present

## 2023-11-05 DIAGNOSIS — M545 Low back pain, unspecified: Secondary | ICD-10-CM | POA: Diagnosis not present

## 2023-11-08 DIAGNOSIS — M545 Low back pain, unspecified: Secondary | ICD-10-CM | POA: Diagnosis not present

## 2023-11-08 DIAGNOSIS — R262 Difficulty in walking, not elsewhere classified: Secondary | ICD-10-CM | POA: Diagnosis not present

## 2023-11-12 DIAGNOSIS — M545 Low back pain, unspecified: Secondary | ICD-10-CM | POA: Diagnosis not present

## 2023-11-12 DIAGNOSIS — R262 Difficulty in walking, not elsewhere classified: Secondary | ICD-10-CM | POA: Diagnosis not present

## 2023-11-22 ENCOUNTER — Encounter: Payer: Self-pay | Admitting: Family Medicine

## 2023-11-22 ENCOUNTER — Ambulatory Visit: Admitting: Family Medicine

## 2023-11-22 VITALS — BP 132/76 | HR 83 | Resp 16 | Ht 64.0 in | Wt 134.6 lb

## 2023-11-22 DIAGNOSIS — M5416 Radiculopathy, lumbar region: Secondary | ICD-10-CM

## 2023-11-22 DIAGNOSIS — E1169 Type 2 diabetes mellitus with other specified complication: Secondary | ICD-10-CM

## 2023-11-22 DIAGNOSIS — G629 Polyneuropathy, unspecified: Secondary | ICD-10-CM

## 2023-11-22 DIAGNOSIS — Z8673 Personal history of transient ischemic attack (TIA), and cerebral infarction without residual deficits: Secondary | ICD-10-CM | POA: Diagnosis not present

## 2023-11-22 DIAGNOSIS — I10 Essential (primary) hypertension: Secondary | ICD-10-CM

## 2023-11-22 DIAGNOSIS — Z8719 Personal history of other diseases of the digestive system: Secondary | ICD-10-CM

## 2023-11-22 DIAGNOSIS — E538 Deficiency of other specified B group vitamins: Secondary | ICD-10-CM

## 2023-11-22 DIAGNOSIS — I739 Peripheral vascular disease, unspecified: Secondary | ICD-10-CM | POA: Diagnosis not present

## 2023-11-22 DIAGNOSIS — E785 Hyperlipidemia, unspecified: Secondary | ICD-10-CM | POA: Diagnosis not present

## 2023-11-22 DIAGNOSIS — D320 Benign neoplasm of cerebral meninges: Secondary | ICD-10-CM

## 2023-11-22 DIAGNOSIS — M8589 Other specified disorders of bone density and structure, multiple sites: Secondary | ICD-10-CM | POA: Diagnosis not present

## 2023-11-22 DIAGNOSIS — Z79899 Other long term (current) drug therapy: Secondary | ICD-10-CM

## 2023-11-22 DIAGNOSIS — Z23 Encounter for immunization: Secondary | ICD-10-CM | POA: Diagnosis not present

## 2023-11-22 LAB — POCT GLYCOSYLATED HEMOGLOBIN (HGB A1C): Hemoglobin A1C: 6.4 % — AB (ref 4.0–5.6)

## 2023-11-22 MED ORDER — AMLODIPINE BESYLATE 2.5 MG PO TABS: 2.5000 mg | ORAL_TABLET | Freq: Every day | ORAL | 1 refills | Status: AC

## 2023-11-22 MED ORDER — TIZANIDINE HCL 2 MG PO TABS: 2.0000 mg | ORAL_TABLET | Freq: Every day | ORAL | 1 refills | Status: AC

## 2023-11-22 MED ORDER — ATORVASTATIN CALCIUM 40 MG PO TABS: 40.0000 mg | ORAL_TABLET | Freq: Every day | ORAL | 1 refills | Status: AC

## 2023-11-22 MED ORDER — LOSARTAN POTASSIUM 50 MG PO TABS: 50.0000 mg | ORAL_TABLET | Freq: Every day | ORAL | 1 refills | Status: AC

## 2023-11-22 MED ORDER — PANTOPRAZOLE SODIUM 40 MG PO TBEC: 40.0000 mg | DELAYED_RELEASE_TABLET | Freq: Two times a day (BID) | ORAL | 0 refills | Status: AC

## 2023-11-22 MED ORDER — GABAPENTIN 300 MG PO CAPS
ORAL_CAPSULE | ORAL | 1 refills | Status: AC
Start: 2023-11-22 — End: 2024-05-25

## 2023-11-22 NOTE — Progress Notes (Signed)
 Name: Catherine Burgess   MRN: [id number]    DOB: 30-Oct-1935   Date:11/22/2023       Progress Note  Subjective  Chief Complaint  Chief Complaint  Patient presents with   Medical Management of Chronic Issues   Discussed the use of AI scribe software for clinical note transcription with the patient, who gave verbal consent to proceed.  History of Present Illness Catherine Burgess is an 88 year old female with diabetes who presents for a six-month diabetes follow-up.  Her diabetes is well-controlled with an average HbA1c of 6.4%. She follows a diet to manage her condition. No excessive hunger, thirst, or burning during urination, although she urinates frequently.  She has a history of high cholesterol, stroke, and peripheral vascular disease. She experiences leg pain and has an upcoming appointment with a vascular doctor. She takes atorvastatin  for dyslipidemia and adheres to her medication regimen.  Hypertension is managed with amlodipine  2.5 mg and losartan  50 mg, with a reported blood pressure of 132/76 mmHg. No lightheadedness upon standing.  She has a history of gastrointestinal bleeding and was hospitalized twice in April and May for rectal bleeding, undergoing embolization and receiving a blood transfusion. No further bleeding since May. She takes pantoprazole  twice daily for reflux and iron supplements due to previous anemia, with her hemoglobin having returned to normal levels in August. She denies abdominal pain  She takes vitamin D  and B12 supplements for deficiencies and adheres to these medications. She also has osteopenia and is due for a bone density test.  She experiences pain in her right lower leg and foot. She takes gabapentin  300 mg in the morning and 600 mg at night, and tizanidine  at night for pain management. The pain sometimes radiates from her back to her legs.  She lives alone but her son lives nearby and checks on her regularly. She manages her daily activities  independently, including cleaning her house. No emergency alert system in place.    Patient Active Problem List   Diagnosis Date Noted   Acute blood loss anemia 06/09/2023   B12 deficiency 06/01/2022   Peripheral polyneuropathy 06/01/2022   Dyslipidemia associated with type 2 diabetes mellitus (HCC) 06/01/2022   PAD (peripheral artery disease) 01/30/2022   Diabetes mellitus type 2, diet-controlled (HCC) 05/11/2021   Abnormal ankle brachial index (ABI) 03/02/2018   Calcified cerebral meningioma (HCC) 10/04/2017   History of CVA (cerebrovascular accident) without residual deficits 10/04/2017   Subacromial impingement of right shoulder 10/04/2017   History of anemia 10/13/2016   Abnormal CT of brain 07/23/2016   Vitamin D  deficiency 10/28/2015   Post menopausal syndrome 10/28/2015   Osteopenia of multiple sites 10/28/2015   Cervical disc disease 10/28/2015   H/O total knee replacement 01/20/2015   Vaginal atrophy 09/26/2014   Osteoarthritis of both knees 07/31/2014   GERD without esophagitis 07/31/2014   Essential hypertension 07/31/2014    Past Surgical History:  Procedure Laterality Date   CATARACT EXTRACTION, BILATERAL     COLONOSCOPY N/A 06/10/2023   Procedure: COLONOSCOPY;  Surgeon: Jinny Carmine, MD;  Location: [facility] ENDOSCOPY;  Service: Endoscopy;  Laterality: N/A;   COLONOSCOPY WITH PROPOFOL  N/A 06/20/2020   Procedure: COLONOSCOPY WITH PROPOFOL ;  Surgeon: Unk Corinn Skiff, MD;  Location: [facility] ENDOSCOPY;  Service: Gastroenterology;  Laterality: N/A;   ESOPHAGOGASTRODUODENOSCOPY N/A 06/10/2023   Procedure: EGD (ESOPHAGOGASTRODUODENOSCOPY);  Surgeon: Jinny Carmine, MD;  Location: [facility] ENDOSCOPY;  Service: Endoscopy;  Laterality: N/A;   EYE SURGERY     HIP  PINNING Left 1956   JOINT REPLACEMENT     REPLACEMENT TOTAL KNEE Left 11/12/2009   TOTAL KNEE REVISION Left 12/07/2016   Procedure: TOTAL KNEE REVISION;  Surgeon: Mardee Lynwood SQUIBB, MD;  Location: [facility] ORS;  Service:  Orthopedics;  Laterality: Left;    Family History  Problem Relation Age of Onset   Emphysema Father        Smoker   CAD Mother    Stroke Mother    Diabetes Brother    Seizures Brother    Cancer Brother        unknown   Diabetes Brother     Social History   Tobacco Use   Smoking status: Former    Current packs/day: 0.00    Average packs/day: 0.3 packs/day for 1 year (0.3 ttl pk-yrs)    Types: Cigarettes    Start date: 56    Quit date: 1966    Years since quitting: 59.8   Smokeless tobacco: Never   Tobacco comments:    smoking cessation materials not required  Substance Use Topics   Alcohol use: No    Alcohol/week: 0.0 standard drinks of alcohol     Current Outpatient Medications:    acetaminophen  (TYLENOL ) 500 MG tablet, Take 1 tablet (500 mg total) by mouth 2 (two) times daily., Disp: 180 tablet, Rfl: 0   Calcium -Vitamin D  600-200 MG-UNIT tablet, Take 1 tablet by mouth daily. , Disp: , Rfl:    ferrous sulfate  325 (65 FE) MG EC tablet, Take 1 tablet (325 mg total) by mouth daily with breakfast., Disp: 90 tablet, Rfl: 0   fluticasone  (FLONASE ) 50 MCG/ACT nasal spray, Place into the nose., Disp: , Rfl:    polyethylene glycol powder (GLYCOLAX /MIRALAX ) 17 GM/SCOOP powder, Take 17 g by mouth 2 (two) times daily as needed., Disp: 3350 g, Rfl: 1   amLODipine  (NORVASC ) 2.5 MG tablet, Take 1 tablet (2.5 mg total) by mouth daily., Disp: 90 tablet, Rfl: 1   atorvastatin  (LIPITOR) 40 MG tablet, Take 1 tablet (40 mg total) by mouth daily. TAKE 1 TABLET(40 MG) BY MOUTH DAILY, Disp: 90 tablet, Rfl: 1   gabapentin  (NEURONTIN ) 300 MG capsule, Take 1 capsule (300 mg total) by mouth every morning AND 2 capsules (600 mg total) at bedtime., Disp: 270 capsule, Rfl: 1   losartan  (COZAAR ) 50 MG tablet, Take 1 tablet (50 mg total) by mouth daily., Disp: 90 tablet, Rfl: 1   pantoprazole  (PROTONIX ) 40 MG tablet, Take 1 tablet (40 mg total) by mouth 2 (two) times daily before a meal., Disp: 180  tablet, Rfl: 0   tiZANidine  (ZANAFLEX ) 2 MG tablet, Take 1 tablet (2 mg total) by mouth at bedtime. PRN, Disp: 90 tablet, Rfl: 1  Allergies  Allergen Reactions   Lovastatin Other (See Comments)    chest   Pravastatin     Other reaction(s): Muscle Pain chest   Rosuvastatin     Other reaction(s): Muscle Pain chest Other reaction(s): Muscle Pain chest   Statins     Other reaction(s): Muscle Pain chest    I personally reviewed active problem list, medication list, allergies with the patient/caregiver today.   ROS  Ten systems reviewed and is negative except as mentioned in HPI    Objective Physical Exam  CONSTITUTIONAL: Patient appears well-developed and well-nourished. No distress. HEENT: Head atraumatic, normocephalic, neck supple. CARDIOVASCULAR: Normal rate, regular rhythm and normal heart sounds. No murmur heard. No BLE edema. PULMONARY: Effort normal and breath sounds normal. Lungs clear to auscultation.  No respiratory distress. ABDOMINAL: Abdomen non-tender. There is no tenderness or distention. MUSCULOSKELETAL:decrease rom of left knee, uses a cane  PSYCHIATRIC: Patient has a normal mood and affect. Behavior is normal. Judgment and thought content normal.  Vitals:   11/22/23 1056  BP: 132/76  Pulse: 83  Resp: 16  SpO2: 99%  Weight: 134 lb 9.6 oz (61.1 kg)  Height: 5' 4 (1.626 m)    Body mass index is 23.1 kg/m.  Recent Results (from the past 2160 hours)  POCT glycosylated hemoglobin (Hb A1C)     Status: Abnormal   Collection Time: 11/22/23 11:04 AM  Result Value Ref Range   Hemoglobin A1C 6.4 (A) 4.0 - 5.6 %   HbA1c POC (<> result, manual entry)     HbA1c, POC (prediabetic range)     HbA1c, POC (controlled diabetic range)      Diabetic Foot Exam:  Diabetic foot exam was performed with the following findings:   No deformities, ulcerations, or other skin breakdown Normal sensation of 10g monofilament Intact posterior tibialis and dorsalis pedis  pulses      PHQ2/9:    11/22/2023   10:55 AM 09/09/2023    8:58 AM 06/21/2023    9:49 AM 04/12/2023    1:20 PM 11/18/2022    7:50 AM  Depression screen PHQ 2/9  Decreased Interest 0 0 0 0 0  Down, Depressed, Hopeless 0 0 0 0 0  PHQ - 2 Score 0 0 0 0 0  Altered sleeping  0  0 0  Tired, decreased energy  0  0 0  Change in appetite  0  0 0  Feeling bad or failure about yourself   0  0 0  Trouble concentrating  0  0 0  Moving slowly or fidgety/restless  0  0 0  Suicidal thoughts  0  0 0  PHQ-9 Score  0  0 0  Difficult doing work/chores  Not difficult at all  Not difficult at all Not difficult at all    phq 9 is negative  Fall Risk:    11/22/2023   10:55 AM 09/09/2023    9:02 AM 05/21/2023   11:06 AM 11/18/2022    7:50 AM 09/04/2022    8:26 AM  Fall Risk   Falls in the past year? 0 0 0 0 0  Number falls in past yr: 0 0 0 0 0  Injury with Fall? 0 0 0 0 0  Risk for fall due to : No Fall Risks No Fall Risks No Fall Risks No Fall Risks No Fall Risks  Follow up Falls evaluation completed Falls evaluation completed;Falls prevention discussed Falls prevention discussed;Education provided;Falls evaluation completed Falls prevention discussed;Falls evaluation completed;Education provided Education provided;Falls prevention discussed      Assessment & Plan Type 2 diabetes mellitus with complications (hypertension, dyslipidemia, peripheral vascular disease) Diabetes well-controlled (A1c 6.4%). Hypertension managed. Dyslipidemia and peripheral vascular disease monitored.  - Continue current diet for diabetes management. - Refill losartan  and amlodipine  for hypertension. - Refill atorvastatin  for dyslipidemia. - Monitor blood pressure regularly. - Ensure regular follow-up with vascular specialist. - Advise regular monitoring of eyes and feet due to diabetes complications.  Polyneuropathy and radiculopathy, right lower extremity Polyneuropathy and radiculopathy in right lower extremity.  Pain managed with gabapentin  and tizanidine . - Refill gabapentin  and tizanidine  for pain management. - Monitor symptoms and adjust medications as needed.  Anemia secondary to prior GI bleeding, resolved Anemia resolved. Hemoglobin 13.6 in August. No further bleeding  since May. Iron supplementation ongoing. GI follow-up scheduled. - Order CBC, iron studies, and ferritin to monitor anemia status. - Continue current iron supplementation. - Evaluate iron levels to determine if supplementation can be stopped.  Gastroesophageal reflux disease (GERD) GERD managed with pantoprazole . Advised GI consultation for dosage adjustment. - Consult with GI specialist regarding potential dosage adjustment.  Bilateral primary osteoarthritis of knee Bilateral knee osteoarthritis with limited function, especially left knee.  Osteopenia Osteopenia with decreased bone density. Last scan in 2020. Fracture risk noted. - Order bone density scan to assess current status. - Consider treatment options based on scan results.  Vitamin D  and B12 deficiency Vitamin D  and B12 deficiencies managed with supplementation. No new symptoms. - Continue vitamin D  and B12 supplementation. - Monitor levels as needed.  Cerebrovascular disease Cerebrovascular disease with calcified cerebral meningioma. No new symptoms reported. - Continue monitoring for any new symptoms.  General Health Maintenance Regular screenings and vaccinations discussed. Emphasized emergency alert systems due to living alone. - Encourage use of emergency alert system. - Schedule COVID-19 vaccination at local pharmacy. - Implement daily check-ins with family for safety.

## 2023-11-23 ENCOUNTER — Ambulatory Visit: Payer: Self-pay | Admitting: Family Medicine

## 2023-11-23 LAB — CBC WITH DIFFERENTIAL/PLATELET
Absolute Lymphocytes: 1425 {cells}/uL (ref 850–3900)
Absolute Monocytes: 431 {cells}/uL (ref 200–950)
Basophils Absolute: 31 {cells}/uL (ref 0–200)
Basophils Relative: 0.4 %
Eosinophils Absolute: 62 {cells}/uL (ref 15–500)
Eosinophils Relative: 0.8 %
HCT: 44 % (ref 35.0–45.0)
Hemoglobin: 13.7 g/dL (ref 11.7–15.5)
MCH: 27.2 pg (ref 27.0–33.0)
MCHC: 31.1 g/dL — ABNORMAL LOW (ref 32.0–36.0)
MCV: 87.5 fL (ref 80.0–100.0)
MPV: 10.2 fL (ref 7.5–12.5)
Monocytes Relative: 5.6 %
Neutro Abs: 5752 {cells}/uL (ref 1500–7800)
Neutrophils Relative %: 74.7 %
Platelets: 295 Thousand/uL (ref 140–400)
RBC: 5.03 Million/uL (ref 3.80–5.10)
RDW: 14.8 % (ref 11.0–15.0)
Total Lymphocyte: 18.5 %
WBC: 7.7 Thousand/uL (ref 3.8–10.8)

## 2023-11-23 LAB — LIPID PANEL
Cholesterol: 98 mg/dL (ref <200)
HDL: 46 mg/dL — ABNORMAL LOW (ref >=50)
LDL Cholesterol (Calc): 38 mg/dL
Non-HDL Cholesterol (Calc): 52 mg/dL (ref <130)
Total CHOL/HDL Ratio: 2.1 (calc) (ref <5.0)
Triglycerides: 61 mg/dL (ref <150)

## 2023-11-23 LAB — COMPREHENSIVE METABOLIC PANEL WITH GFR
AG Ratio: 1.8 (calc) (ref 1.0–2.5)
ALT: 17 U/L (ref 6–29)
AST: 18 U/L (ref 10–35)
Albumin: 3.9 g/dL (ref 3.6–5.1)
Alkaline phosphatase (APISO): 128 U/L (ref 37–153)
BUN: 18 mg/dL (ref 7–25)
CO2: 31 mmol/L (ref 20–32)
Calcium: 9.2 mg/dL (ref 8.6–10.4)
Chloride: 105 mmol/L (ref 98–110)
Creat: 0.7 mg/dL (ref 0.60–0.95)
Globulin: 2.2 g/dL (ref 1.9–3.7)
Glucose, Bld: 100 mg/dL — ABNORMAL HIGH (ref 65–99)
Potassium: 4.1 mmol/L (ref 3.5–5.3)
Sodium: 142 mmol/L (ref 135–146)
Total Bilirubin: 1.3 mg/dL — ABNORMAL HIGH (ref 0.2–1.2)
Total Protein: 6.1 g/dL (ref 6.1–8.1)
eGFR: 85 mL/min/1.73m2 (ref >=60)

## 2023-11-23 LAB — IRON,TIBC AND FERRITIN PANEL
%SAT: 43 % (ref 16–45)
Ferritin: 68 ng/mL (ref 16–288)
Iron: 136 ug/dL (ref 45–160)
TIBC: 314 ug/dL (ref 250–450)

## 2023-11-23 LAB — MICROALBUMIN / CREATININE URINE RATIO
Creatinine, Urine: 79 mg/dL (ref 20–275)
Microalb Creat Ratio: 22 mg/g{creat} (ref <30)
Microalb, Ur: 1.7 mg/dL

## 2023-11-23 LAB — B12 AND FOLATE PANEL
Folate: 9.9 ng/mL
Vitamin B-12: 689 pg/mL (ref 200–1100)

## 2023-11-23 LAB — VITAMIN D 25 HYDROXY (VIT D DEFICIENCY, FRACTURES): Vit D, 25-Hydroxy: 37 ng/mL (ref 30–100)

## 2023-11-29 DIAGNOSIS — R600 Localized edema: Secondary | ICD-10-CM | POA: Diagnosis not present

## 2023-11-29 DIAGNOSIS — E119 Type 2 diabetes mellitus without complications: Secondary | ICD-10-CM | POA: Diagnosis not present

## 2023-11-29 DIAGNOSIS — G4733 Obstructive sleep apnea (adult) (pediatric): Secondary | ICD-10-CM | POA: Diagnosis not present

## 2023-11-29 DIAGNOSIS — I10 Essential (primary) hypertension: Secondary | ICD-10-CM | POA: Diagnosis not present

## 2023-11-29 DIAGNOSIS — E785 Hyperlipidemia, unspecified: Secondary | ICD-10-CM | POA: Diagnosis not present

## 2023-11-29 DIAGNOSIS — R7303 Prediabetes: Secondary | ICD-10-CM | POA: Diagnosis not present

## 2023-11-29 DIAGNOSIS — K219 Gastro-esophageal reflux disease without esophagitis: Secondary | ICD-10-CM | POA: Diagnosis not present

## 2023-11-29 DIAGNOSIS — E782 Mixed hyperlipidemia: Secondary | ICD-10-CM | POA: Diagnosis not present

## 2023-11-29 DIAGNOSIS — Z8673 Personal history of transient ischemic attack (TIA), and cerebral infarction without residual deficits: Secondary | ICD-10-CM | POA: Diagnosis not present

## 2023-11-29 DIAGNOSIS — E1169 Type 2 diabetes mellitus with other specified complication: Secondary | ICD-10-CM | POA: Diagnosis not present

## 2024-05-22 ENCOUNTER — Ambulatory Visit: Admitting: Family Medicine

## 2024-07-28 ENCOUNTER — Ambulatory Visit (INDEPENDENT_AMBULATORY_CARE_PROVIDER_SITE_OTHER): Admitting: Nurse Practitioner

## 2024-07-28 ENCOUNTER — Encounter (INDEPENDENT_AMBULATORY_CARE_PROVIDER_SITE_OTHER)

## 2024-09-14 ENCOUNTER — Ambulatory Visit
